# Patient Record
Sex: Female | Born: 1995 | Race: Black or African American | Hispanic: No | Marital: Single | Smoking: Never smoker
Health system: Southern US, Community
[De-identification: ages and names within clinical notes are randomized; demographics above are authoritative.]

## PROBLEM LIST (undated history)

## (undated) ENCOUNTER — Inpatient Hospital Stay (HOSPITAL_COMMUNITY): Payer: Self-pay

## (undated) ENCOUNTER — Emergency Department (HOSPITAL_BASED_OUTPATIENT_CLINIC_OR_DEPARTMENT_OTHER)

## (undated) DIAGNOSIS — N390 Urinary tract infection, site not specified: Secondary | ICD-10-CM

## (undated) DIAGNOSIS — F1090 Alcohol use, unspecified, uncomplicated: Secondary | ICD-10-CM

## (undated) DIAGNOSIS — F32A Depression, unspecified: Secondary | ICD-10-CM

## (undated) DIAGNOSIS — F419 Anxiety disorder, unspecified: Secondary | ICD-10-CM

## (undated) DIAGNOSIS — R87629 Unspecified abnormal cytological findings in specimens from vagina: Secondary | ICD-10-CM

## (undated) DIAGNOSIS — F329 Major depressive disorder, single episode, unspecified: Secondary | ICD-10-CM

## (undated) DIAGNOSIS — I10 Essential (primary) hypertension: Secondary | ICD-10-CM

## (undated) DIAGNOSIS — D649 Anemia, unspecified: Secondary | ICD-10-CM

## (undated) DIAGNOSIS — O24419 Gestational diabetes mellitus in pregnancy, unspecified control: Secondary | ICD-10-CM

## (undated) DIAGNOSIS — F332 Major depressive disorder, recurrent severe without psychotic features: Secondary | ICD-10-CM

## (undated) HISTORY — PX: BREAST SURGERY: SHX581

## (undated) HISTORY — DX: Alcohol use, unspecified, uncomplicated: F10.90

## (undated) HISTORY — DX: Major depressive disorder, recurrent severe without psychotic features: F33.2

## (undated) HISTORY — DX: Gestational diabetes mellitus in pregnancy, unspecified control: O24.419

## (undated) HISTORY — DX: Essential (primary) hypertension: I10

## (undated) HISTORY — DX: Unspecified abnormal cytological findings in specimens from vagina: R87.629

---
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
## 2013-01-27 ENCOUNTER — Encounter (HOSPITAL_COMMUNITY): Payer: Self-pay | Admitting: Emergency Medicine

## 2013-01-27 ENCOUNTER — Emergency Department (HOSPITAL_COMMUNITY)
Admission: EM | Admit: 2013-01-27 | Discharge: 2013-01-27 | Disposition: A | Discharge status: Home / Self Care | Payer: Medicaid Other | Attending: Emergency Medicine | Admitting: Emergency Medicine

## 2013-01-27 DIAGNOSIS — Z3202 Encounter for pregnancy test, result negative: Secondary | ICD-10-CM | POA: Insufficient documentation

## 2013-01-27 DIAGNOSIS — Z79899 Other long term (current) drug therapy: Secondary | ICD-10-CM | POA: Insufficient documentation

## 2013-01-27 DIAGNOSIS — R112 Nausea with vomiting, unspecified: Secondary | ICD-10-CM | POA: Insufficient documentation

## 2013-01-27 LAB — URINALYSIS, ROUTINE W REFLEX MICROSCOPIC
Hgb urine dipstick: NEGATIVE
Leukocytes, UA: NEGATIVE
Nitrite: NEGATIVE
Protein, ur: 30 mg/dL — AB
Specific Gravity, Urine: 1.021 (ref 1.005–1.030)
Urobilinogen, UA: 0.2 mg/dL (ref 0.0–1.0)

## 2013-01-27 LAB — COMPREHENSIVE METABOLIC PANEL
ALT: 10 U/L (ref 0–35)
CO2: 23 mEq/L (ref 19–32)
Calcium: 9.1 mg/dL (ref 8.4–10.5)
Creatinine, Ser: 0.77 mg/dL (ref 0.50–1.10)
GFR calc Af Amer: >90 mL/min (ref >90)
GFR calc non Af Amer: >90 mL/min (ref >90)
Glucose, Bld: 95 mg/dL (ref 70–99)
Sodium: 140 mEq/L (ref 135–145)
Total Protein: 7.8 g/dL (ref 6.0–8.3)

## 2013-01-27 LAB — CBC WITH DIFFERENTIAL/PLATELET
Basophils Absolute: 0 10*3/uL (ref 0.0–0.1)
Eosinophils Absolute: 0.1 10*3/uL (ref 0.0–0.7)
Eosinophils Relative: 1 % (ref 0–5)
HCT: 37 % (ref 36.0–46.0)
MCH: 24.5 pg — ABNORMAL LOW (ref 26.0–34.0)
MCV: 73.7 fL — ABNORMAL LOW (ref 78.0–100.0)
Monocytes Absolute: 0.6 10*3/uL (ref 0.1–1.0)
Platelets: 312 10*3/uL (ref 150–400)
RDW: 14.1 % (ref 11.5–15.5)

## 2013-01-27 LAB — LIPASE, BLOOD: Lipase: 32 U/L (ref 11–59)

## 2013-01-27 LAB — URINE MICROSCOPIC-ADD ON

## 2013-01-27 LAB — PREGNANCY, URINE: Preg Test, Ur: NEGATIVE

## 2013-01-27 MED ORDER — ONDANSETRON 4 MG PO TBDP: 4.0000 mg | ORAL_TABLET | Freq: Three times a day (TID) | ORAL | Status: DC | PRN

## 2013-01-27 MED ORDER — ONDANSETRON 4 MG PO TBDP
4.0000 mg | ORAL_TABLET | Freq: Once | ORAL | Status: AC
  Administered 2013-01-27: 4 mg via ORAL
  Filled 2013-01-27: qty 1

## 2013-01-27 NOTE — ED Notes (Signed)
The pt is c/o abd cramps n v no diarrrhea since this am she has ahad a cold and draining nose for the past 2-3 days.  lmp dec 3rd

## 2013-01-27 NOTE — ED Provider Notes (Signed)
CSN: [id number]     Arrival date & time 01/27/13  1509 History   First MD Initiated Contact with Patient 01/27/13 1955     Chief Complaint  Patient presents with  . Emesis   (Consider location/radiation/quality/duration/timing/severity/associated sxs/prior Treatment) The history is provided by the patient and medical records.   This is a 17 year old female with no significant PMH presenting to the ED for abdominal cramping, nausea, and non-bloody, non-bilious vomiting, onset this morning. Pt states abdominal cramping has since resolved.  Denies sick contacts, fevers, sweats, or chills.  Patient states she had a cold for the past few days with rhinorrhea, PND, and mildly sore throat-- these sx have since improved.  Pt has been unable to tolerate anything PO today. No urinary sx or vaginal complaints.  Denies possibility of pregnancy. No intervention PTA.  History reviewed. No pertinent past medical history. Past Surgical History  Procedure Laterality Date  . Mass excision  10/29/2011    Procedure: EXCISION MASS;  Surgeon: Shelly Rubenstein, MD;  Location: [facility];  Service: General;  Laterality: Bilateral;  excision of bilateral breast masses  . Breast surgery     Family History  Problem Relation Age of Onset  . Cancer Maternal Grandmother     lung cancer  . Cancer Other     bladder cancer   History  Substance Use Topics  . Smoking status: Never Smoker   . Smokeless tobacco: Not on file  . Alcohol Use: No   OB History   Grav Para Term Preterm Abortions TAB SAB Ect Mult Living                 Review of Systems  Gastrointestinal: Positive for nausea and vomiting.  All other systems reviewed and are negative.    Allergies  Review of patient's allergies indicates no known allergies.  Home Medications   Current Outpatient Rx  Name  Route  Sig  Dispense  Refill  . FLUoxetine (PROZAC) 40 MG capsule   Oral   Take 40 mg by mouth daily with breakfast.           BP 123/84   Pulse 98  Temp(Src) 98.8 F (37.1 C) (Oral)  Resp 18  SpO2 100%  LMP 12/04/2012  Physical Exam  Nursing note and vitals reviewed. Constitutional: She is oriented to person, place, and time. She appears well-developed and well-nourished. No distress.  HENT:  Head: Normocephalic and atraumatic.  Mouth/Throat: Oropharynx is clear and moist.  Mucus membranes moist, appears well hydrated  Eyes: Conjunctivae and EOM are normal. Pupils are equal, round, and reactive to light.  Neck: Normal range of motion.  Cardiovascular: Normal rate, regular rhythm and normal heart sounds.   Pulmonary/Chest: Effort normal and breath sounds normal. No respiratory distress. She has no wheezes.  Abdominal: Soft. Bowel sounds are normal. There is no tenderness. There is no guarding.  Abdomen soft, nondistended, no peritoneal signs  Musculoskeletal: Normal range of motion.  Neurological: She is alert and oriented to person, place, and time.  Skin: Skin is warm and dry. She is not diaphoretic.  Psychiatric: She has a normal mood and affect.     ED Course  Procedures (including critical care time) Labs Review Labs Reviewed  CBC WITH DIFFERENTIAL - Abnormal; Notable for the following:    MCV 73.7 (*)    MCH 24.5 (*)    All other components within normal limits  URINALYSIS, ROUTINE W REFLEX MICROSCOPIC - Abnormal; Notable for the following:  Protein, ur 30 (*)    All other components within normal limits  URINE MICROSCOPIC-ADD ON - Abnormal; Notable for the following:    Squamous Epithelial / LPF FEW (*)    Bacteria, UA FEW (*)    All other components within normal limits  COMPREHENSIVE METABOLIC PANEL  LIPASE, BLOOD  PREGNANCY, URINE   Imaging Review No results found.  EKG Interpretation   None       MDM   1. Nausea & vomiting    Urine pregnancy negative.  Labs reassuring.  Patient is non-toxic and appears well-hydrated, do not feel iVF are indicated at this time. Patient given Zofran  ODT and was able to tolerate multiple cups of water without recurrent vomiting.  Abdominal exam remains benign and non-surgical-- sx likely due to viral gastroenteritis.  Pt afebrile, non-toxic appearing, NAD, VS stable- ok for discharge.  Rx Zofran. Signs/sx that would warrant return to the ED discussed, patient acknowledged understanding and agreed.  Garlon Hatchet, PA-C 01/27/13 2221

## 2013-01-27 NOTE — ED Notes (Signed)
Gingerale given for fluid challenge. Tolerating so far.

## 2013-01-29 NOTE — ED Provider Notes (Signed)
Medical screening examination/treatment/procedure(s) were performed by non-physician practitioner and as supervising physician I was immediately available for consultation/collaboration.  Sharon Rubis M Takerra Lupinacci, MD 01/29/13 1628 

## 2013-12-05 ENCOUNTER — Encounter (HOSPITAL_COMMUNITY): Payer: Self-pay | Admitting: *Deleted

## 2013-12-05 ENCOUNTER — Emergency Department (HOSPITAL_COMMUNITY)
Admission: EM | Admit: 2013-12-05 | Discharge: 2013-12-06 | Disposition: A | Discharge status: Home / Self Care | Payer: Medicaid Other | Attending: Emergency Medicine | Admitting: Emergency Medicine

## 2013-12-05 DIAGNOSIS — Z79899 Other long term (current) drug therapy: Secondary | ICD-10-CM | POA: Insufficient documentation

## 2013-12-05 DIAGNOSIS — Z113 Encounter for screening for infections with a predominantly sexual mode of transmission: Secondary | ICD-10-CM | POA: Insufficient documentation

## 2013-12-05 DIAGNOSIS — N898 Other specified noninflammatory disorders of vagina: Secondary | ICD-10-CM | POA: Insufficient documentation

## 2013-12-05 DIAGNOSIS — Z202 Contact with and (suspected) exposure to infections with a predominantly sexual mode of transmission: Secondary | ICD-10-CM

## 2013-12-05 LAB — URINALYSIS, ROUTINE W REFLEX MICROSCOPIC
Bilirubin Urine: NEGATIVE
Glucose, UA: NEGATIVE mg/dL
Ketones, ur: NEGATIVE mg/dL
NITRITE: NEGATIVE
Protein, ur: NEGATIVE mg/dL
SPECIFIC GRAVITY, URINE: 1.026 (ref 1.005–1.030)
UROBILINOGEN UA: 1 mg/dL (ref 0.0–1.0)
pH: 6.5 (ref 5.0–8.0)

## 2013-12-05 LAB — URINE MICROSCOPIC-ADD ON

## 2013-12-05 LAB — PREGNANCY, URINE: PREG TEST UR: NEGATIVE

## 2013-12-05 NOTE — ED Notes (Signed)
The pt is c/o itching in her vagina for 2-3 days no vaginal discharge lmp oct

## 2013-12-06 LAB — WET PREP, GENITAL

## 2013-12-06 LAB — HIV ANTIBODY (ROUTINE TESTING W REFLEX): HIV 1&2 Ab, 4th Generation: NONREACTIVE

## 2013-12-06 MED ORDER — AZITHROMYCIN 250 MG PO TABS
1000.0000 mg | ORAL_TABLET | Freq: Once | ORAL | Status: AC
  Administered 2013-12-06: 1000 mg via ORAL
  Filled 2013-12-06: qty 4

## 2013-12-06 MED ORDER — STERILE WATER FOR INJECTION IJ SOLN
INTRAMUSCULAR | Status: AC
  Administered 2013-12-06: 0.9 mL
  Filled 2013-12-06: qty 10

## 2013-12-06 MED ORDER — FLUCONAZOLE 100 MG PO TABS
150.0000 mg | ORAL_TABLET | Freq: Once | ORAL | Status: AC
  Administered 2013-12-06: 150 mg via ORAL
  Filled 2013-12-06: qty 2

## 2013-12-06 MED ORDER — CEFTRIAXONE SODIUM 250 MG IJ SOLR
250.0000 mg | Freq: Once | INTRAMUSCULAR | Status: AC
  Administered 2013-12-06: 250 mg via INTRAMUSCULAR
  Filled 2013-12-06: qty 250

## 2013-12-06 MED ORDER — METRONIDAZOLE 500 MG PO TABS
2000.0000 mg | ORAL_TABLET | Freq: Once | ORAL | Status: AC
  Administered 2013-12-06: 2000 mg via ORAL
  Filled 2013-12-06: qty 4

## 2013-12-06 NOTE — Discharge Instructions (Signed)
Urine pregnancy test was negative. You been treated for STD exposures. Return for any new or worse symptoms.

## 2013-12-06 NOTE — ED Provider Notes (Signed)
CSN: [id number]     Arrival date & time 12/05/13  2032 History   First MD Initiated Contact with Patient 12/05/13 2337     Chief Complaint  Patient presents with  . Vaginal Itching     (Consider location/radiation/quality/duration/timing/severity/associated sxs/prior Treatment) Patient is a 18 y.o. female presenting with vaginal itching. The history is provided by the patient.  Vaginal Itching Pertinent negatives include no chest pain, no abdominal pain, no headaches and no shortness of breath.  18 year old female with concern for STD exposure. Had unprotected sex. Patient's been having a discharge is whitish and itching in the vaginal area. Last measured. Was October. Patient with no known allergies. Patient denies abdominal pain pelvic pain and difficulty urinating. Patient also with concern for a mass to her left breast. She's had a history of breast abscess in the past. She's had an area of soreness there.  History reviewed. No pertinent past medical history. Past Surgical History  Procedure Laterality Date  . Mass excision  10/29/2011    Procedure: EXCISION MASS;  Surgeon: Harl Bowie, MD;  Location: [facility];  Service: General;  Laterality: Bilateral;  excision of bilateral breast masses  . Breast surgery     Family History  Problem Relation Age of Onset  . Cancer Maternal Grandmother     lung cancer  . Cancer Other     bladder cancer   History  Substance Use Topics  . Smoking status: Never Smoker   . Smokeless tobacco: Not on file  . Alcohol Use: No   OB History    No data available     Review of Systems  Constitutional: Negative for fever.  HENT: Negative for congestion.   Eyes: Negative for redness.  Respiratory: Negative for shortness of breath.   Cardiovascular: Negative for chest pain.  Gastrointestinal: Negative for nausea, vomiting and abdominal pain.  Genitourinary: Positive for vaginal discharge. Negative for dysuria, vaginal bleeding and difficulty  urinating.  Musculoskeletal: Negative for myalgias and back pain.  Skin: Negative for rash.  Allergic/Immunologic: Negative for immunocompromised state.  Neurological: Negative for headaches.  Hematological: Does not bruise/bleed easily.  Psychiatric/Behavioral: Negative for confusion.      Allergies  Review of patient's allergies indicates no known allergies.  Home Medications   Prior to Admission medications   Medication Sig Start Date End Date Taking? Authorizing Provider  FLUoxetine (PROZAC) 40 MG capsule Take 40 mg by mouth daily with breakfast.    Yes Historical Provider, MD  ondansetron (ZOFRAN ODT) 4 MG disintegrating tablet Take 1 tablet (4 mg total) by mouth every 8 (eight) hours as needed for nausea. 01/27/13   Larene Pickett, PA-C   BP 101/81 mmHg  Pulse 84  Temp(Src) 98.2 F (36.8 C)  Resp 18  SpO2 100%  LMP 11/04/2013 Physical Exam  Constitutional: She is oriented to person, place, and time. She appears well-developed and well-nourished. No distress.  HENT:  Head: Normocephalic and atraumatic.  Mouth/Throat: Oropharynx is clear and moist.  Eyes: Conjunctivae and EOM are normal. Pupils are equal, round, and reactive to light.  Neck: Normal range of motion.  Cardiovascular: Normal rate, regular rhythm and normal heart sounds.   No murmur heard. Pulmonary/Chest: Effort normal and breath sounds normal. No respiratory distress.  Patient concerned with the left-sided breast mass. History of abscesses in the past. No palpable cyst or mass. Nontender.  Abdominal: Soft. Bowel sounds are normal. There is no tenderness.  Genitourinary: Vaginal discharge found.  Normal external genitalia. No  lesions. Whitish vaginal discharge. Yellowish cervical discharge. No cervical motion tenderness. No uterine tenderness. No adnexal tenderness.  Musculoskeletal: Normal range of motion.  Neurological: She is alert and oriented to person, place, and time. No cranial nerve deficit. She  exhibits normal muscle tone. Coordination normal.  Skin: Skin is warm. No rash noted.  Nursing note and vitals reviewed.   ED Course  Procedures (including critical care time) Labs Review Labs Reviewed  URINALYSIS, ROUTINE W REFLEX MICROSCOPIC - Abnormal; Notable for the following:    APPearance CLOUDY (*)    Hgb urine dipstick SMALL (*)    Leukocytes, UA MODERATE (*)    All other components within normal limits  URINE MICROSCOPIC-ADD ON - Abnormal; Notable for the following:    Squamous Epithelial / LPF MANY (*)    Bacteria, UA FEW (*)    All other components within normal limits  WET PREP, GENITAL  GC/CHLAMYDIA PROBE AMP  PREGNANCY, URINE  HIV ANTIBODY (ROUTINE TESTING)   Results for orders placed or performed during the hospital encounter of 12/05/13  Urinalysis, Routine w reflex microscopic  Result Value Ref Range   Color, Urine YELLOW YELLOW   APPearance CLOUDY (A) CLEAR   Specific Gravity, Urine 1.026 1.005 - 1.030   pH 6.5 5.0 - 8.0   Glucose, UA NEGATIVE NEGATIVE mg/dL   Hgb urine dipstick SMALL (A) NEGATIVE   Bilirubin Urine NEGATIVE NEGATIVE   Ketones, ur NEGATIVE NEGATIVE mg/dL   Protein, ur NEGATIVE NEGATIVE mg/dL   Urobilinogen, UA 1.0 0.0 - 1.0 mg/dL   Nitrite NEGATIVE NEGATIVE   Leukocytes, UA MODERATE (A) NEGATIVE  Pregnancy, urine  Result Value Ref Range   Preg Test, Ur NEGATIVE NEGATIVE  Urine microscopic-add on  Result Value Ref Range   Squamous Epithelial / LPF MANY (A) RARE   WBC, UA 11-20 <3 WBC/hpf   RBC / HPF 3-6 <3 RBC/hpf   Bacteria, UA FEW (A) RARE   Urine-Other MUCOUS PRESENT      Imaging Review No results found.   EKG Interpretation None      MDM   Final diagnoses:  Possible exposure to STD  Vaginal discharge   Exam not consistent with PID. There is a whitish discharge that could be yeast in nature. We'll treat with Diflucan. For her STD exposure concern patient will be treated with Rocephin and Zithromax and Flagyl.  Patient also with a yellow discharge and very well could be consistent with an STD. Patient also concerned about left breast area tenderness and of history of abscesses. Nothing palpable no longer tender. Patient even confirm that the soreness she had there is now gone.  Patient treated with Rocephin IM 1 g of Zithromax, 2 g of Flagyl, and 150 mg Diflucan.    Fredia Sorrow, MD 12/06/13 [phone]

## 2013-12-06 NOTE — ED Notes (Signed)
Waiting on medication from the pharmacy

## 2013-12-07 LAB — GC/CHLAMYDIA PROBE AMP
CT PROBE, AMP APTIMA: NEGATIVE
GC Probe RNA: NEGATIVE

## 2014-05-15 ENCOUNTER — Emergency Department (HOSPITAL_COMMUNITY)
Admission: EM | Admit: 2014-05-15 | Discharge: 2014-05-15 | Disposition: A | Discharge status: Home / Self Care | Payer: Medicaid Other | Attending: Emergency Medicine | Admitting: Emergency Medicine

## 2014-05-15 ENCOUNTER — Emergency Department (HOSPITAL_COMMUNITY): Payer: Medicaid Other

## 2014-05-15 ENCOUNTER — Encounter (HOSPITAL_COMMUNITY): Payer: Self-pay | Admitting: Emergency Medicine

## 2014-05-15 DIAGNOSIS — J209 Acute bronchitis, unspecified: Secondary | ICD-10-CM | POA: Insufficient documentation

## 2014-05-15 DIAGNOSIS — J40 Bronchitis, not specified as acute or chronic: Secondary | ICD-10-CM

## 2014-05-15 LAB — BASIC METABOLIC PANEL
Anion gap: 8 (ref 5–15)
BUN: 10 mg/dL (ref 6–23)
CO2: 23 mmol/L (ref 19–32)
Calcium: 9.4 mg/dL (ref 8.4–10.5)
Chloride: 106 mmol/L (ref 96–112)
Creatinine, Ser: 0.86 mg/dL (ref 0.50–1.10)
GFR calc non Af Amer: >90 mL/min (ref >90)
Glucose, Bld: 89 mg/dL (ref 70–99)
POTASSIUM: 3.8 mmol/L (ref 3.5–5.1)
SODIUM: 137 mmol/L (ref 135–145)

## 2014-05-15 LAB — CBC
HEMATOCRIT: 39.4 % (ref 36.0–46.0)
HEMOGLOBIN: 12.5 g/dL (ref 12.0–15.0)
MCH: 23.7 pg — AB (ref 26.0–34.0)
MCHC: 31.7 g/dL (ref 30.0–36.0)
MCV: 74.6 fL — AB (ref 78.0–100.0)
Platelets: 317 10*3/uL (ref 150–400)
RBC: 5.28 MIL/uL — AB (ref 3.87–5.11)
RDW: 13.9 % (ref 11.5–15.5)
WBC: 9.1 10*3/uL (ref 4.0–10.5)

## 2014-05-15 LAB — BRAIN NATRIURETIC PEPTIDE: B Natriuretic Peptide: 16.3 pg/mL (ref 0.0–100.0)

## 2014-05-15 LAB — I-STAT TROPONIN, ED: TROPONIN I, POC: 0 ng/mL (ref 0.00–0.08)

## 2014-05-15 MED ORDER — AZITHROMYCIN 250 MG PO TABS: ORAL_TABLET | ORAL | Status: DC

## 2014-05-15 NOTE — ED Notes (Signed)
Pt c/o burning left sided chest pain and SOB x 1 week, 1 episode of emesis this morning.

## 2014-05-15 NOTE — Discharge Instructions (Signed)
Take tylenol or motrin for pain.  Follow up if not improvning

## 2014-05-15 NOTE — ED Provider Notes (Signed)
CSN: [id number]     Arrival date & time 05/15/14  2952 History   First MD Initiated Contact with Patient 05/15/14 1006     Chief Complaint  Patient presents with  . Chest Pain     (Consider location/radiation/quality/duration/timing/severity/associated sxs/prior Treatment) Patient is a 19 y.o. female presenting with chest pain. The history is provided by the patient (the pt complains of a cough for a week and some chest pain).  Chest Pain Pain location:  L chest Pain quality: aching   Pain radiates to:  Does not radiate Pain radiates to the back: no   Pain severity:  Moderate Onset quality:  Sudden Timing:  Constant Progression:  Waxing and waning Chronicity:  New Context: not breathing   Associated symptoms: no abdominal pain, no back pain, no cough, no fatigue and no headache     History reviewed. No pertinent past medical history. Past Surgical History  Procedure Laterality Date  . Mass excision  10/29/2011    Procedure: EXCISION MASS;  Surgeon: Harl Bowie, MD;  Location: [facility];  Service: General;  Laterality: Bilateral;  excision of bilateral breast masses  . Breast surgery     Family History  Problem Relation Age of Onset  . Cancer Maternal Grandmother     lung cancer  . Cancer Other     bladder cancer   History  Substance Use Topics  . Smoking status: Never Smoker   . Smokeless tobacco: Not on file  . Alcohol Use: No   OB History    No data available     Review of Systems  Constitutional: Negative for appetite change and fatigue.  HENT: Negative for congestion, ear discharge and sinus pressure.   Eyes: Negative for discharge.  Respiratory: Negative for cough.   Cardiovascular: Positive for chest pain.  Gastrointestinal: Negative for abdominal pain and diarrhea.  Genitourinary: Negative for frequency and hematuria.  Musculoskeletal: Negative for back pain.  Skin: Negative for rash.  Neurological: Negative for seizures and headaches.   Psychiatric/Behavioral: Negative for hallucinations.      Allergies  Review of patient's allergies indicates no known allergies.  Home Medications   Prior to Admission medications   Medication Sig Start Date End Date Taking? Authorizing Provider  azithromycin (ZITHROMAX Z-PAK) 250 MG tablet 2 po day one, then 1 daily x 4 days 05/15/14   Milton Ferguson, MD  ondansetron (ZOFRAN ODT) 4 MG disintegrating tablet Take 1 tablet (4 mg total) by mouth every 8 (eight) hours as needed for nausea. Patient not taking: Reported on 05/15/2014 01/27/13   Larene Pickett, PA-C   BP 137/77 mmHg  Pulse 89  Temp(Src) 98.9 F (37.2 C) (Oral)  Resp 18  SpO2 100%  LMP 05/01/2014 Physical Exam  Constitutional: She is oriented to person, place, and time. She appears well-developed.  HENT:  Head: Normocephalic.  Eyes: Conjunctivae and EOM are normal. No scleral icterus.  Neck: Neck supple. No thyromegaly present.  Cardiovascular: Normal rate and regular rhythm.  Exam reveals no gallop and no friction rub.   No murmur heard. Pulmonary/Chest: No stridor. She has no wheezes. She has no rales. She exhibits no tenderness.  Abdominal: She exhibits no distension. There is no tenderness. There is no rebound.  Musculoskeletal: Normal range of motion. She exhibits no edema.  Lymphadenopathy:    She has no cervical adenopathy.  Neurological: She is oriented to person, place, and time. She exhibits normal muscle tone. Coordination normal.  Skin: No rash noted. No erythema.  Psychiatric: She has a normal mood and affect. Her behavior is normal.    ED Course  Procedures (including critical care time) Labs Review Labs Reviewed  CBC - Abnormal; Notable for the following:    RBC 5.28 (*)    MCV 74.6 (*)    MCH 23.7 (*)    All other components within normal limits  BASIC METABOLIC PANEL  BRAIN NATRIURETIC PEPTIDE  I-STAT TROPOININ, ED    Imaging Review Dg Chest Port 1 View  05/15/2014   CLINICAL DATA:   Chest pain for 1 week, shortness of Breath  EXAM: PORTABLE CHEST - 1 VIEW  COMPARISON:  09/16/2012  FINDINGS: Cardiomediastinal silhouette is stable. No acute infiltrate or pleural effusion. No pulmonary edema. Bony thorax is unremarkable.  IMPRESSION: No active disease.   Electronically Signed   By: Lahoma Crocker M.D.   On: 05/15/2014 10:51     EKG Interpretation   Date/Time:  Wednesday May 15 2014 09:57:38 EDT Ventricular Rate:  98 PR Interval:  155 QRS Duration: 94 QT Interval:  345 QTC Calculation: 440 R Axis:   80 Text Interpretation:  Sinus rhythm Confirmed by Alejandra Barna  MD, Tristin Gladman [phone])  on 05/15/2014 1:10:36 PM      MDM   Final diagnoses:  Bronchitis    Bronchitis,  Zpak,  Follow up prn    Milton Ferguson, MD 05/15/14 1315

## 2014-11-04 ENCOUNTER — Encounter (HOSPITAL_COMMUNITY): Payer: Self-pay | Admitting: Emergency Medicine

## 2014-11-04 ENCOUNTER — Emergency Department (HOSPITAL_COMMUNITY)
Admission: EM | Admit: 2014-11-04 | Discharge: 2014-11-04 | Disposition: A | Discharge status: Home / Self Care | Payer: Medicaid Other | Attending: Emergency Medicine | Admitting: Emergency Medicine

## 2014-11-04 DIAGNOSIS — N760 Acute vaginitis: Secondary | ICD-10-CM | POA: Insufficient documentation

## 2014-11-04 DIAGNOSIS — Z202 Contact with and (suspected) exposure to infections with a predominantly sexual mode of transmission: Secondary | ICD-10-CM | POA: Insufficient documentation

## 2014-11-04 DIAGNOSIS — Z3202 Encounter for pregnancy test, result negative: Secondary | ICD-10-CM | POA: Insufficient documentation

## 2014-11-04 DIAGNOSIS — B9689 Other specified bacterial agents as the cause of diseases classified elsewhere: Secondary | ICD-10-CM

## 2014-11-04 DIAGNOSIS — R079 Chest pain, unspecified: Secondary | ICD-10-CM | POA: Insufficient documentation

## 2014-11-04 LAB — WET PREP, GENITAL
Trich, Wet Prep: NONE SEEN
WBC WET PREP: NONE SEEN
YEAST WET PREP: NONE SEEN

## 2014-11-04 LAB — CBC WITH DIFFERENTIAL/PLATELET
Basophils Absolute: 0.1 10*3/uL (ref 0.0–0.1)
Basophils Relative: 1 %
EOS ABS: 0.1 10*3/uL (ref 0.0–0.7)
Eosinophils Relative: 2 %
HCT: 40.1 % (ref 36.0–46.0)
Hemoglobin: 12.9 g/dL (ref 12.0–15.0)
Lymphocytes Relative: 38 %
Lymphs Abs: 2.4 10*3/uL (ref 0.7–4.0)
MCH: 24 pg — ABNORMAL LOW (ref 26.0–34.0)
MCHC: 32.2 g/dL (ref 30.0–36.0)
MCV: 74.5 fL — ABNORMAL LOW (ref 78.0–100.0)
Monocytes Absolute: 0.5 10*3/uL (ref 0.1–1.0)
Monocytes Relative: 8 %
NEUTROS PCT: 51 %
Neutro Abs: 3.2 10*3/uL (ref 1.7–7.7)
Platelets: 272 10*3/uL (ref 150–400)
RBC: 5.38 MIL/uL — AB (ref 3.87–5.11)
RDW: 14.3 % (ref 11.5–15.5)
WBC: 6.3 10*3/uL (ref 4.0–10.5)

## 2014-11-04 LAB — URINALYSIS, ROUTINE W REFLEX MICROSCOPIC
Bilirubin Urine: NEGATIVE
Glucose, UA: NEGATIVE mg/dL
KETONES UR: NEGATIVE mg/dL
LEUKOCYTES UA: NEGATIVE
NITRITE: NEGATIVE
PROTEIN: NEGATIVE mg/dL
Specific Gravity, Urine: 1.018 (ref 1.005–1.030)
Urobilinogen, UA: 1 mg/dL (ref 0.0–1.0)
pH: 7 (ref 5.0–8.0)

## 2014-11-04 LAB — POC URINE PREG, ED: Preg Test, Ur: NEGATIVE

## 2014-11-04 LAB — URINE MICROSCOPIC-ADD ON

## 2014-11-04 MED ORDER — AZITHROMYCIN 250 MG PO TABS
1000.0000 mg | ORAL_TABLET | Freq: Every day | ORAL | Status: DC
  Administered 2014-11-04: 1000 mg via ORAL
  Filled 2014-11-04: qty 4

## 2014-11-04 MED ORDER — METRONIDAZOLE 500 MG PO TABS: 500.0000 mg | ORAL_TABLET | Freq: Two times a day (BID) | ORAL | Status: DC

## 2014-11-04 MED ORDER — CEFTRIAXONE SODIUM 250 MG IJ SOLR
250.0000 mg | Freq: Once | INTRAMUSCULAR | Status: AC
  Administered 2014-11-04: 250 mg via INTRAMUSCULAR
  Filled 2014-11-04: qty 250

## 2014-11-04 MED ORDER — STERILE WATER FOR INJECTION IJ SOLN
INTRAMUSCULAR | Status: AC
  Administered 2014-11-04: 2.5 mL
  Filled 2014-11-04: qty 10

## 2014-11-04 NOTE — ED Notes (Signed)
Pt c/o pelvic pain and discharge that started today.  Also c/o chest pain x 3 days.  Denies any cough but states she feels SOB.  LMP 9/26-2016

## 2014-11-04 NOTE — Discharge Instructions (Signed)
Bacterial Vaginosis Bacterial vaginosis is a vaginal infection that occurs when the normal balance of bacteria in the vagina is disrupted. It results from an overgrowth of certain bacteria. This is the most common vaginal infection in women of childbearing age. Treatment is important to prevent complications, especially in pregnant women, as it can cause a premature delivery. CAUSES  Bacterial vaginosis is caused by an increase in harmful bacteria that are normally present in smaller amounts in the vagina. Several different kinds of bacteria can cause bacterial vaginosis. However, the reason that the condition develops is not fully understood. RISK FACTORS Certain activities or behaviors can put you at an increased risk of developing bacterial vaginosis, including:  Having a new sex partner or multiple sex partners.  Douching.  Using an intrauterine device (IUD) for contraception. Women do not get bacterial vaginosis from toilet seats, bedding, swimming pools, or contact with objects around them. SIGNS AND SYMPTOMS  Some women with bacterial vaginosis have no signs or symptoms. Common symptoms include:  Grey vaginal discharge.  A fishlike odor with discharge, especially after sexual intercourse.  Itching or burning of the vagina and vulva.  Burning or pain with urination. DIAGNOSIS  Your health care provider will take a medical history and examine the vagina for signs of bacterial vaginosis. A sample of vaginal fluid may be taken. Your health care provider will look at this sample under a microscope to check for bacteria and abnormal cells. A vaginal pH test may also be done.  TREATMENT  Bacterial vaginosis may be treated with antibiotic medicines. These may be given in the form of a pill or a vaginal cream. A second round of antibiotics may be prescribed if the condition comes back after treatment.  HOME CARE INSTRUCTIONS   Only take over-the-counter or prescription medicines as  directed by your health care provider.  If antibiotic medicine was prescribed, take it as directed. Make sure you finish it even if you start to feel better.  Do not have sex until treatment is completed.  Tell all sexual partners that you have a vaginal infection. They should see their health care provider and be treated if they have problems, such as a mild rash or itching.  Practice safe sex by using condoms and only having one sex partner. SEEK MEDICAL CARE IF:   Your symptoms are not improving after 3 days of treatment.  You have increased discharge or pain.  You have a fever. MAKE SURE YOU:   Understand these instructions.  Will watch your condition.  Will get help right away if you are not doing well or get worse. FOR MORE INFORMATION  Centers for Disease Control and Prevention, Division of STD Prevention: [employer]raud[employer] American Sexual Health Association (ASHA): www.ashastd.org  Document Released: 01/18/2005 Document Revised: 11/08/2012 Document Reviewed: 08/30/2012 [facility] Patient Information 2015 [place], [place]. This information is not intended to replace advice given to you by your health care provider. Make sure you discuss any questions you have with your health care provider.  Chest Pain (Nonspecific) It is often hard to give a specific diagnosis for the cause of chest pain. There is always a chance that your pain could be related to something serious, such as a heart attack or a blood clot in the lungs. You need to follow up with your health care provider for further evaluation. CAUSES   Heartburn.  Pneumonia or bronchitis.  Anxiety or stress.  Inflammation around your heart (pericarditis) or lung (pleuritis or pleurisy).  A blood clot  in the lung.  A collapsed lung (pneumothorax). It can develop suddenly on its own (spontaneous pneumothorax) or from trauma to the chest.  Shingles infection (herpes zoster virus). The chest wall is composed of bones,  muscles, and cartilage. Any of these can be the source of the pain.  The bones can be bruised by injury.  The muscles or cartilage can be strained by coughing or overwork.  The cartilage can be affected by inflammation and become sore (costochondritis). DIAGNOSIS  Lab tests or other studies may be needed to find the cause of your pain. Your health care provider may have you take a test called an ambulatory electrocardiogram (ECG). An ECG records your heartbeat patterns over a 24-hour period. You may also have other tests, such as:  Transthoracic echocardiogram (TTE). During echocardiography, sound waves are used to evaluate how blood flows through your heart.  Transesophageal echocardiogram (TEE).  Cardiac monitoring. This allows your health care provider to monitor your heart rate and rhythm in real time.  Holter monitor. This is a portable device that records your heartbeat and can help diagnose heart arrhythmias. It allows your health care provider to track your heart activity for several days, if needed.  Stress tests by exercise or by giving medicine that makes the heart beat faster. TREATMENT   Treatment depends on what may be causing your chest pain. Treatment may include:  Acid blockers for heartburn.  Anti-inflammatory medicine.  Pain medicine for inflammatory conditions.  Antibiotics if an infection is present.  You may be advised to change lifestyle habits. This includes stopping smoking and avoiding alcohol, caffeine, and chocolate.  You may be advised to keep your head raised (elevated) when sleeping. This reduces the chance of acid going backward from your stomach into your esophagus. Most of the time, nonspecific chest pain will improve within 2-3 days with rest and mild pain medicine.  HOME CARE INSTRUCTIONS   If antibiotics were prescribed, take them as directed. Finish them even if you start to feel better.  For the next few days, avoid physical activities  that bring on chest pain. Continue physical activities as directed.  Do not use any tobacco products, including cigarettes, chewing tobacco, or electronic cigarettes.  Avoid drinking alcohol.  Only take medicine as directed by your health care provider.  Follow your health care provider's suggestions for further testing if your chest pain does not go away.  Keep any follow-up appointments you made. If you do not go to an appointment, you could develop lasting (chronic) problems with pain. If there is any problem keeping an appointment, call to reschedule. SEEK MEDICAL CARE IF:   Your chest pain does not go away, even after treatment.  You have a rash with blisters on your chest.  You have a fever. SEEK IMMEDIATE MEDICAL CARE IF:   You have increased chest pain or pain that spreads to your arm, neck, jaw, back, or abdomen.  You have shortness of breath.  You have an increasing cough, or you cough up blood.  You have severe back or abdominal pain.  You feel nauseous or vomit.  You have severe weakness.  You faint.  You have chills. This is an emergency. Do not wait to see if the pain will go away. Get medical help at once. Call your local emergency services (911 in [place] Do not drive yourself to the hospital. MAKE SURE YOU:   Understand these instructions.  Will watch your condition.  Will get help right away if  you are not doing well or get worse. Document Released: 10/28/2004 Document Revised: 01/23/2013 Document Reviewed: 08/24/2007 [facility] Patient Information 2015 [place], [place]. This information is not intended to replace advice given to you by your health care provider. Make sure you discuss any questions you have with your health care provider.  Sexually Transmitted Disease A sexually transmitted disease (STD) is a disease or infection that may be passed (transmitted) from person to person, usually during sexual activity. This may happen by way of saliva, semen,  blood, vaginal mucus, or urine. Common STDs include:   Gonorrhea.   Chlamydia.   Syphilis.   HIV and AIDS.   Genital herpes.   Hepatitis B and C.   Trichomonas.   Human papillomavirus (HPV).   Pubic lice.   Scabies.  Mites.  Bacterial vaginosis. WHAT ARE CAUSES OF STDs? An STD may be caused by bacteria, a virus, or parasites. STDs are often transmitted during sexual activity if one person is infected. However, they may also be transmitted through nonsexual means. STDs may be transmitted after:   Sexual intercourse with an infected person.   Sharing sex toys with an infected person.   Sharing needles with an infected person or using unclean piercing or tattoo needles.  Having intimate contact with the genitals, mouth, or rectal areas of an infected person.   Exposure to infected fluids during birth. WHAT ARE THE SIGNS AND SYMPTOMS OF STDs? Different STDs have different symptoms. Some people may not have any symptoms. If symptoms are present, they may include:   Painful or bloody urination.   Pain in the pelvis, abdomen, vagina, anus, throat, or eyes.   A skin rash, itching, or irritation.  Growths, ulcerations, blisters, or sores in the genital and anal areas.  Abnormal vaginal discharge with or without bad odor.   Penile discharge in men.   Fever.   Pain or bleeding during sexual intercourse.   Swollen glands in the groin area.   Yellow skin and eyes (jaundice). This is seen with hepatitis.   Swollen testicles.  Infertility.  Sores and blisters in the mouth. HOW ARE STDs DIAGNOSED? To make a diagnosis, your health care provider may:   Take a medical history.   Perform a physical exam.   Take a sample of any discharge to examine.  Swab the throat, cervix, opening to the penis, rectum, or vagina for testing.  Test a sample of your first morning urine.   Perform blood tests.   Perform a Pap test, if this applies.    Perform a colposcopy.   Perform a laparoscopy.  HOW ARE STDs TREATED? Treatment depends on the STD. Some STDs may be treated but not cured.   Chlamydia, gonorrhea, trichomonas, and syphilis can be cured with antibiotic medicine.   Genital herpes, hepatitis, and HIV can be treated, but not cured, with prescribed medicines. The medicines lessen symptoms.   Genital warts from HPV can be treated with medicine or by freezing, burning (electrocautery), or surgery. Warts may come back.   HPV cannot be cured with medicine or surgery. However, abnormal areas may be removed from the cervix, vagina, or vulva.   If your diagnosis is confirmed, your recent sexual partners need treatment. This is true even if they are symptom-free or have a negative culture or evaluation. They should not have sex until their health care providers say it is okay. HOW CAN I REDUCE MY RISK OF GETTING AN STD? Take these steps to reduce your risk of getting  an STD:  Use latex condoms, dental dams, and water-soluble lubricants during sexual activity. Do not use petroleum jelly or oils.  Avoid having multiple sex partners.  Do not have sex with someone who has other sex partners.  Do not have sex with anyone you do not know or who is at high risk for an STD.  Avoid risky sex practices that can break your skin.  Do not have sex if you have open sores on your mouth or skin.  Avoid drinking too much alcohol or taking illegal drugs. Alcohol and drugs can affect your judgment and put you in a vulnerable position.  Avoid engaging in oral and anal sex acts.  Get vaccinated for HPV and hepatitis. If you have not received these vaccines in the past, talk to your health care provider about whether one or both might be right for you.   If you are at risk of being infected with HIV, it is recommended that you take a prescription medicine daily to prevent HIV infection. This is called pre-exposure prophylaxis (PrEP).  You are considered at risk if:  You are a man who has sex with other men (MSM).  You are a heterosexual man or woman and are sexually active with more than one partner.  You take drugs by injection.  You are sexually active with a partner who has HIV.  Talk with your health care provider about whether you are at high risk of being infected with HIV. If you choose to begin PrEP, you should first be tested for HIV. You should then be tested every 3 months for as long as you are taking PrEP.  WHAT SHOULD I DO IF I THINK I HAVE AN STD?  See your health care provider.   Tell your sexual partner(s). They should be tested and treated for any STDs.  Do not have sex until your health care provider says it is okay. WHEN SHOULD I GET IMMEDIATE MEDICAL CARE? Contact your health care provider right away if:   You have severe abdominal pain.  You are a man and notice swelling or pain in your testicles.  You are a woman and notice swelling or pain in your vagina. Document Released: 04/10/2002 Document Revised: 01/23/2013 Document Reviewed: 08/08/2012 [facility] Patient Information 2015 [place], [place]. This information is not intended to replace advice given to you by your health care provider. Make sure you discuss any questions you have with your health care provider.  Safe Sex Safe sex is about reducing the risk of giving or getting a sexually transmitted disease (STD). STDs are spread through sexual contact involving the genitals, mouth, or rectum. Some STDs can be cured and others cannot. Safe sex can also prevent unintended pregnancies.  WHAT ARE SOME SAFE SEX PRACTICES?  Limit your sexual activity to only one partner who is having sex with only you.  Talk to your partner about his or her past partners, past STDs, and drug use.  Use a condom every time you have sexual intercourse. This includes vaginal, oral, and anal sexual activity. Both females and males should wear condoms during  oral sex. Only use latex or polyurethane condoms and water-based lubricants. Using petroleum-based lubricants or oils to lubricate a condom will weaken the condom and increase the chance that it will break. The condom should be in place from the beginning to the end of sexual activity. Wearing a condom reduces, but does not completely eliminate, your risk of getting or giving an STD. STDs can  be spread by contact with infected body fluids and skin.  Get vaccinated for hepatitis B and HPV.  Avoid alcohol and recreational drugs, which can affect your judgment. You may forget to use a condom or participate in high-risk sex.  For females, avoid douching after sexual intercourse. Douching can spread an infection farther into the reproductive tract.  Check your body for signs of sores, blisters, rashes, or unusual discharge. See your health care provider if you notice any of these signs.  Avoid sexual contact if you have symptoms of an infection or are being treated for an STD. If you or your partner has herpes, avoid sexual contact when blisters are present. Use condoms at all other times.  If you are at risk of being infected with HIV, it is recommended that you take a prescription medicine daily to prevent HIV infection. This is called pre-exposure prophylaxis (PrEP). You are considered at risk if:  You are a man who has sex with other men (MSM).  You are a heterosexual man or woman who is sexually active with more than one partner.  You take drugs by injection.  You are sexually active with a partner who has HIV.  Talk with your health care provider about whether you are at high risk of being infected with HIV. If you choose to begin PrEP, you should first be tested for HIV. You should then be tested every 3 months for as long as you are taking PrEP.  See your health care provider for regular screenings, exams, and tests for other STDs. Before having sex with a new partner, each of you should  be screened for STDs and should talk about the results with each other. WHAT ARE THE BENEFITS OF SAFE SEX?   There is less chance of getting or giving an STD.  You can prevent unwanted or unintended pregnancies.  By discussing safe sex concerns with your partner, you may increase feelings of intimacy, comfort, trust, and honesty between the two of you. Document Released: 02/26/2004 Document Revised: 06/04/2013 Document Reviewed: 07/12/2011 [facility] Patient Information 2015 [place], [place]. This information is not intended to replace advice given to you by your health care provider. Make sure you discuss any questions you have with your health care provider.

## 2014-11-04 NOTE — ED Notes (Signed)
Pelvic cart at bedside. 

## 2014-11-04 NOTE — ED Provider Notes (Signed)
CSN: [id number]     Arrival date & time 11/04/14  1631 History   First MD Initiated Contact with Patient 11/04/14 1922     Chief Complaint  Patient presents with  . Pelvic Pain  . Chest Pain   HPI  Katrina Stewart is a 19 year old female presenting with pelvic pain and chest pain. Pt states that her pelvic pain began this morning. She states that she feels the pain inside her vagina. It is described as sharp and severe. It occurs intermittently without exacerbating factors. She has tried advil without relief. She is also complaining of green vaginal discharge. Denies dysuria, vaginal itching or hematuria. She states she might have had an STD exposure and would like STD testing. Pt also complaining of left sided, sharp chest pain. She states that she has had this pain for multiple years. The pain is intermittent and worsened with movement or coughing. She states that the pain lasts for a few minutes and then resolves without intervention. She has been seen in the ED for this complaint before but never worked up outpatient.  Denies fevers, chills, headaches, blurred vision, cough, SOB, abdominal pain, nausea, vomiting, diarrhea or rashes.   History reviewed. No pertinent past medical history. Past Surgical History  Procedure Laterality Date  . Mass excision  10/29/2011    Procedure: EXCISION MASS;  Surgeon: Harl Bowie, MD;  Location: [facility] ORS;  Service: General;  Laterality: Bilateral;  excision of bilateral breast masses  . Breast surgery     Family History  Problem Relation Age of Onset  . Cancer Maternal Grandmother     lung cancer  . Cancer Other     bladder cancer   Social History  Substance Use Topics  . Smoking status: Never Smoker   . Smokeless tobacco: None  . Alcohol Use: No   OB History    No data available     Review of Systems  Constitutional: Negative for fever and chills.  HENT: Negative for congestion and rhinorrhea.   Eyes: Negative for visual disturbance.   Respiratory: Negative for shortness of breath and wheezing.   Cardiovascular: Positive for chest pain.  Gastrointestinal: Negative for nausea, vomiting and abdominal pain.  Genitourinary: Positive for vaginal discharge and pelvic pain. Negative for dysuria, hematuria, flank pain and difficulty urinating.  Musculoskeletal: Negative for back pain.  Skin: Negative for rash.  Neurological: Negative for dizziness and headaches.      Allergies  Review of patient's allergies indicates no known allergies.  Home Medications   Prior to Admission medications   Medication Sig Start Date End Date Taking? Authorizing Provider  azithromycin (ZITHROMAX Z-PAK) 250 MG tablet 2 po day one, then 1 daily x 4 days Patient not taking: Reported on 11/04/2014 05/15/14   Milton Ferguson, MD  metroNIDAZOLE (FLAGYL) 500 MG tablet Take 1 tablet (500 mg total) by mouth 2 (two) times daily. 11/04/14   Mamoudou Mulvehill, PA-C  ondansetron (ZOFRAN ODT) 4 MG disintegrating tablet Take 1 tablet (4 mg total) by mouth every 8 (eight) hours as needed for nausea. Patient not taking: Reported on 05/15/2014 01/27/13   Larene Pickett, PA-C   BP 107/69 mmHg  Pulse 77  Temp(Src) 98.8 F (37.1 C) (Oral)  Resp 23  Ht 5\' 8"  (1.727 m)  Wt 230 lb (104.327 kg)  BMI 34.98 kg/m2  SpO2 100%  LMP 10/28/2014 Physical Exam  Constitutional: She appears well-developed and well-nourished. No distress.  HENT:  Head: Normocephalic and atraumatic.  Eyes: Conjunctivae are normal. Right eye exhibits no discharge. Left eye exhibits no discharge. No scleral icterus.  Neck: Normal range of motion.  Cardiovascular: Normal rate, regular rhythm and normal heart sounds.   Pulmonary/Chest: Effort normal and breath sounds normal. No respiratory distress. She has no wheezes. She has no rales. She exhibits tenderness.  Reproducible left anterior wall tenderness  Abdominal: Soft. Bowel sounds are normal. She exhibits no distension. There is no tenderness.  There is no rebound and no guarding.  Genitourinary: Uterus normal. There is no rash or lesion on the right labia. There is no rash or lesion on the left labia. Cervix exhibits no motion tenderness, no discharge and no friability. Right adnexum displays no mass and no tenderness. Left adnexum displays no mass and no tenderness. No erythema or tenderness in the vagina. Vaginal discharge (copious thick white discharge) found.  Musculoskeletal: Normal range of motion.  Moves extremities spontaneously and walks with a steady gait  Neurological: She is alert. Coordination normal.  Skin: Skin is warm and dry.  Psychiatric: She has a normal mood and affect. Her behavior is normal.  Nursing note and vitals reviewed.   ED Course  Procedures (including critical care time) Labs Review Labs Reviewed  WET PREP, GENITAL - Abnormal; Notable for the following:    Clue Cells Wet Prep HPF POC MODERATE (*)    All other components within normal limits  URINALYSIS, ROUTINE W REFLEX MICROSCOPIC (NOT AT [facility]) - Abnormal; Notable for the following:    APPearance CLOUDY (*)    Hgb urine dipstick TRACE (*)    All other components within normal limits  CBC WITH DIFFERENTIAL/PLATELET - Abnormal; Notable for the following:    RBC 5.38 (*)    MCV 74.5 (*)    MCH 24.0 (*)    All other components within normal limits  URINE MICROSCOPIC-ADD ON - Abnormal; Notable for the following:    Squamous Epithelial / LPF FEW (*)    Bacteria, UA MANY (*)    All other components within normal limits  HIV ANTIBODY (ROUTINE TESTING)  RPR  POC URINE PREG, ED  GC/CHLAMYDIA PROBE AMP ([place]) NOT AT [facility]    Imaging Review No results found. I have personally reviewed and evaluated these images and lab results as part of my medical decision-making.   EKG Interpretation   Date/Time:  Monday November 04 2014 16:50:18 EDT Ventricular Rate:  85 PR Interval:  147 QRS Duration: 93 QT Interval:  414 QTC Calculation: 492 R  Axis:   47 Text Interpretation:  Sinus rhythm Borderline T abnormalities, anterior  leads Borderline prolonged QT interval Baseline wander in lead(s) II aVR  aVF V4 ED PHYSICIAN INTERPRETATION AVAILABLE IN [facility] Confirmed  by TEST, Record ([id number]) on 11/05/2014 7:51:15 AM      MDM   Final diagnoses:  STD exposure  BV (bacterial vaginosis)  Chest pain, unspecified chest pain type   Pt presenting with pelvic pain and chest pain. Pelvic pain is associated with green vaginal discharge and recent possible STD exposure. Pt also complaining of left sided chest pain. She states she has had this pain intermittently for many years without outpatient workup. VSS. Lungs CTAB. Chest pain is reproducible with palpation. On pelvic exam there is copious white vaginal discharge. No CMT. Wet prep positive for BV. Pt is also requesting prophylactic treatment for GC. Will treat with flagyl for BV. Chest pain is reproducible and likely musculoskeletal in origin. Pt given [facility]  information to establish primary care and for outpatient evaluation of chest pain. Return precautions given in discharge paperwork and discussed with pt at bedside. Pt stable for discharge    Josephina Gip, PA-C 11/05/14 [place], MD 11/05/14 1731

## 2014-11-05 LAB — GC/CHLAMYDIA PROBE AMP (~~LOC~~) NOT AT ARMC
CHLAMYDIA, DNA PROBE: NEGATIVE
NEISSERIA GONORRHEA: NEGATIVE

## 2014-11-05 LAB — HIV ANTIBODY (ROUTINE TESTING W REFLEX): HIV SCREEN 4TH GENERATION: NONREACTIVE

## 2014-11-05 LAB — RPR: RPR Ser Ql: NONREACTIVE

## 2014-11-28 ENCOUNTER — Emergency Department (HOSPITAL_COMMUNITY)
Admission: EM | Admit: 2014-11-28 | Discharge: 2014-11-29 | Disposition: A | Discharge status: Other Acute Inpatient Hospital | Payer: Federal, State, Local not specified - Other | Attending: Physician Assistant | Admitting: Physician Assistant

## 2014-11-28 ENCOUNTER — Encounter (HOSPITAL_COMMUNITY): Payer: Self-pay | Admitting: Emergency Medicine

## 2014-11-28 DIAGNOSIS — F322 Major depressive disorder, single episode, severe without psychotic features: Secondary | ICD-10-CM | POA: Diagnosis present

## 2014-11-28 DIAGNOSIS — Y9289 Other specified places as the place of occurrence of the external cause: Secondary | ICD-10-CM | POA: Insufficient documentation

## 2014-11-28 DIAGNOSIS — Z792 Long term (current) use of antibiotics: Secondary | ICD-10-CM | POA: Insufficient documentation

## 2014-11-28 DIAGNOSIS — X789XXA Intentional self-harm by unspecified sharp object, initial encounter: Secondary | ICD-10-CM | POA: Insufficient documentation

## 2014-11-28 DIAGNOSIS — Y9389 Activity, other specified: Secondary | ICD-10-CM | POA: Insufficient documentation

## 2014-11-28 DIAGNOSIS — Y998 Other external cause status: Secondary | ICD-10-CM | POA: Insufficient documentation

## 2014-11-28 DIAGNOSIS — F911 Conduct disorder, childhood-onset type: Secondary | ICD-10-CM | POA: Insufficient documentation

## 2014-11-28 DIAGNOSIS — S59912A Unspecified injury of left forearm, initial encounter: Secondary | ICD-10-CM | POA: Insufficient documentation

## 2014-11-28 DIAGNOSIS — T39314A Poisoning by propionic acid derivatives, undetermined, initial encounter: Secondary | ICD-10-CM | POA: Insufficient documentation

## 2014-11-28 DIAGNOSIS — T1491 Suicide attempt: Secondary | ICD-10-CM | POA: Insufficient documentation

## 2014-11-28 LAB — CBC
HEMATOCRIT: 36.5 % (ref 36.0–46.0)
Hemoglobin: 11.8 g/dL — ABNORMAL LOW (ref 12.0–15.0)
MCH: 23.9 pg — AB (ref 26.0–34.0)
MCHC: 32.3 g/dL (ref 30.0–36.0)
MCV: 74 fL — AB (ref 78.0–100.0)
Platelets: 264 10*3/uL (ref 150–400)
RBC: 4.93 MIL/uL (ref 3.87–5.11)
RDW: 14.4 % (ref 11.5–15.5)
WBC: 5.2 10*3/uL (ref 4.0–10.5)

## 2014-11-28 LAB — RAPID URINE DRUG SCREEN, HOSP PERFORMED
AMPHETAMINES: NOT DETECTED
BARBITURATES: NOT DETECTED
Benzodiazepines: NOT DETECTED
Cocaine: NOT DETECTED
OPIATES: NOT DETECTED
TETRAHYDROCANNABINOL: NOT DETECTED

## 2014-11-28 LAB — COMPREHENSIVE METABOLIC PANEL
ALK PHOS: 60 U/L (ref 38–126)
ALT: 18 U/L (ref 14–54)
ANION GAP: 8 (ref 5–15)
AST: 33 U/L (ref 15–41)
Albumin: 4 g/dL (ref 3.5–5.0)
BILIRUBIN TOTAL: 0.3 mg/dL (ref 0.3–1.2)
BUN: 8 mg/dL (ref 6–20)
CALCIUM: 9 mg/dL (ref 8.9–10.3)
CO2: 23 mmol/L (ref 22–32)
Chloride: 112 mmol/L — ABNORMAL HIGH (ref 101–111)
Creatinine, Ser: 1 mg/dL (ref 0.44–1.00)
GFR calc non Af Amer: >60 mL/min (ref >60)
Glucose, Bld: 102 mg/dL — ABNORMAL HIGH (ref 65–99)
Potassium: 3.2 mmol/L — ABNORMAL LOW (ref 3.5–5.1)
Sodium: 143 mmol/L (ref 135–145)
TOTAL PROTEIN: 7.1 g/dL (ref 6.5–8.1)

## 2014-11-28 LAB — ACETAMINOPHEN LEVEL

## 2014-11-28 LAB — CBG MONITORING, ED: Glucose-Capillary: 88 mg/dL (ref 65–99)

## 2014-11-28 LAB — POC URINE PREG, ED: PREG TEST UR: NEGATIVE

## 2014-11-28 LAB — ETHANOL: Alcohol, Ethyl (B): 149 mg/dL — ABNORMAL HIGH (ref <5)

## 2014-11-28 LAB — SALICYLATE LEVEL

## 2014-11-28 MED ORDER — SODIUM CHLORIDE 0.9 % IV BOLUS (SEPSIS)
1000.0000 mL | Freq: Once | INTRAVENOUS | Status: AC
  Administered 2014-11-29: 1000 mL via INTRAVENOUS

## 2014-11-28 NOTE — ED Provider Notes (Signed)
CSN: [id number]     Arrival date & time 11/28/14  2214 History  By signing my name below, I, Evelene Croon, attest that this documentation has been prepared under the direction and in the presence of Tkeyah Burkman Julio Alm, MD . Electronically Signed: Evelene Croon, Scribe. 11/28/2014. 11:47 PM.    Chief Complaint  Patient presents with  . Drug Overdose    etoh and ibuprophen   . Suicidal   LEVEL 5 CAVEAT DUE TO intoxication   The history is provided by a parent and the EMS personnel. No language interpreter was used.    HPI Comments:  Katrina Stewart is a 19 y.o. female who presents to the Emergency Department via EMS. Pt's mom states she received a call this evening from the pt's roommate stating the pt had OD'd on ~ a fifth  of smirnoff and an unknown amount of ibuprofen. Pt also cut her wrist; mom reports h/o similar episodes. Pt is supposed to be on Prozac but hasn't taken it in ~ 1.5 years because she stopped going to the psychiatrist. Mom denies h/o psych hospitalization. Per EMS pt was was given versed and 5mg  of haldol en route. Unable to fully obtain HPI/ROS due to intoxication.  History reviewed. No pertinent past medical history. Past Surgical History  Procedure Laterality Date  . Mass excision  10/29/2011    Procedure: EXCISION MASS;  Surgeon: Harl Bowie, MD;  Location: [facility];  Service: General;  Laterality: Bilateral;  excision of bilateral breast masses  . Breast surgery     Family History  Problem Relation Age of Onset  . Cancer Maternal Grandmother     lung cancer  . Cancer Other     bladder cancer   Social History  Substance Use Topics  . Smoking status: Never Smoker   . Smokeless tobacco: None  . Alcohol Use: No   OB History    No data available     Review of Systems  Unable to perform ROS: Mental status change (intoxication)   Allergies  Review of patient's allergies indicates no known allergies.  Home Medications   Prior to Admission  medications   Medication Sig Start Date End Date Taking? Authorizing Provider  azithromycin (ZITHROMAX Z-PAK) 250 MG tablet 2 po day one, then 1 daily x 4 days Patient not taking: Reported on 11/04/2014 05/15/14   Milton Ferguson, MD  metroNIDAZOLE (FLAGYL) 500 MG tablet Take 1 tablet (500 mg total) by mouth 2 (two) times daily. 11/04/14   Stevi Barrett, PA-C  ondansetron (ZOFRAN ODT) 4 MG disintegrating tablet Take 1 tablet (4 mg total) by mouth every 8 (eight) hours as needed for nausea. Patient not taking: Reported on 05/15/2014 01/27/13   Larene Pickett, PA-C   BP 112/53 mmHg  Temp(Src) 97.5 F (36.4 C) (Rectal)  Resp 16  SpO2 99%  LMP 10/28/2014 Physical Exam  Constitutional: She appears well-developed and well-nourished. No distress.  HENT:  Head: Normocephalic and atraumatic.  Eyes: Conjunctivae are normal.  Pinpoint pupils    Cardiovascular: Normal rate.   Pulmonary/Chest: Effort normal.  Abdominal: She exhibits no distension.  Neurological:  Somnolent  Skin: Skin is warm and dry.  Psychiatric: She has a normal mood and affect.  Nursing note and vitals reviewed.   ED Course  Procedures   DIAGNOSTIC STUDIES:  Oxygen Saturation is 99% on RA, normal by my interpretation.    COORDINATION OF CARE:  11:13 PM w3ill order fluids blood work and ibuprofen level to medically  clear pt for psych.  Discussed treatment plan with parents at bedside.  Labs Review Labs Reviewed  COMPREHENSIVE METABOLIC PANEL - Abnormal; Notable for the following:    Potassium 3.2 (*)    Chloride 112 (*)    Glucose, Bld 102 (*)    All other components within normal limits  ETHANOL - Abnormal; Notable for the following:    Alcohol, Ethyl (B) 149 (*)    All other components within normal limits  ACETAMINOPHEN LEVEL - Abnormal; Notable for the following:    Acetaminophen (Tylenol), Serum <10 (*)    All other components within normal limits  CBC - Abnormal; Notable for the following:    Hemoglobin  11.8 (*)    MCV 74.0 (*)    MCH 23.9 (*)    All other components within normal limits  SALICYLATE LEVEL  URINE RAPID DRUG SCREEN, HOSP PERFORMED  PREGNANCY, URINE  CBG MONITORING, ED  POC URINE PREG, ED    Imaging Review No results found. I have personally reviewed and evaluated these lab results as part of my medical decision-making.   EKG Interpretation   Date/Time:  Thursday November 28 2014 22:22:39 EDT Ventricular Rate:  92 PR Interval:  164 QRS Duration: 98 QT Interval:  373 QTC Calculation: 461 R Axis:   71 Text Interpretation:  Sinus rhythm Left atrial enlargement no acute  ischemia No significant change since last tracing Confirmed by Gerald Leitz ([id number]) on 11/28/2014 11:41:09 PM      MDM   Final diagnoses:  None    Patient is a 19 year old female resenting after suicide attempt. Patient was with her roommate . She reportedly drank one fifth of vodka and then an unknown amount of ibuprofen. On arrival please reported that she was very aggressive. She received 3 of IV Versed as well as Haldol.   Patient is arousable on exam but noncommunicative.   We'll medically screening patient. Patient will require time before she is able to ready for TTS consult.  I personally performed the services described in this documentation, which was scribed in my presence. The recorded information has been reviewed and is accurate.    4:29 AM Have watched patient in monitored bed for 4 hours. Still resting peacefully.  Will move to psych holding and have TTS consult/  Alaiza Yau Julio Alm, MD 11/29/14 [phone]

## 2014-11-28 NOTE — ED Notes (Addendum)
Pt here via EMS. Per EMS pt was was given versed (0.5IM followed by 2.5 IV) and 5mg  of haldol in route. Pt was reportedly combative and has self inflicted cuts to her left forearm. Pt arrived in handcuffs. Per EMS pt has not been taking her prozac for about a year. Per EMS pt reportedly has been drinking alcohol and took an unknown amount of ibuprofen

## 2014-11-28 NOTE — ED Notes (Signed)
Bed: UD14 Expected date:  Expected time:  Means of arrival:  Comments: EMS, intoxicated and combative

## 2014-11-29 ENCOUNTER — Inpatient Hospital Stay (HOSPITAL_COMMUNITY)
Admission: AD | Admit: 2014-11-29 | Discharge: 2014-12-03 | DRG: 885 | Disposition: A | Discharge status: Home / Self Care | Payer: Federal, State, Local not specified - Other | Source: Intra-hospital | Attending: Psychiatry | Admitting: Psychiatry

## 2014-11-29 ENCOUNTER — Encounter (HOSPITAL_COMMUNITY): Payer: Self-pay

## 2014-11-29 DIAGNOSIS — Z801 Family history of malignant neoplasm of trachea, bronchus and lung: Secondary | ICD-10-CM

## 2014-11-29 DIAGNOSIS — F322 Major depressive disorder, single episode, severe without psychotic features: Secondary | ICD-10-CM | POA: Diagnosis present

## 2014-11-29 DIAGNOSIS — F1090 Alcohol use, unspecified, uncomplicated: Secondary | ICD-10-CM

## 2014-11-29 DIAGNOSIS — R45851 Suicidal ideations: Secondary | ICD-10-CM | POA: Diagnosis not present

## 2014-11-29 DIAGNOSIS — IMO0002 Reserved for concepts with insufficient information to code with codable children: Secondary | ICD-10-CM

## 2014-11-29 DIAGNOSIS — F332 Major depressive disorder, recurrent severe without psychotic features: Secondary | ICD-10-CM | POA: Diagnosis present

## 2014-11-29 DIAGNOSIS — F4310 Post-traumatic stress disorder, unspecified: Secondary | ICD-10-CM | POA: Diagnosis present

## 2014-11-29 DIAGNOSIS — F10129 Alcohol abuse with intoxication, unspecified: Secondary | ICD-10-CM | POA: Diagnosis present

## 2014-11-29 DIAGNOSIS — G4710 Hypersomnia, unspecified: Secondary | ICD-10-CM | POA: Diagnosis present

## 2014-11-29 DIAGNOSIS — Z8052 Family history of malignant neoplasm of bladder: Secondary | ICD-10-CM | POA: Diagnosis not present

## 2014-11-29 DIAGNOSIS — Y906 Blood alcohol level of 120-199 mg/100 ml: Secondary | ICD-10-CM | POA: Diagnosis present

## 2014-11-29 DIAGNOSIS — F1099 Alcohol use, unspecified with unspecified alcohol-induced disorder: Secondary | ICD-10-CM | POA: Diagnosis not present

## 2014-11-29 HISTORY — DX: Anxiety disorder, unspecified: F41.9

## 2014-11-29 HISTORY — DX: Depression, unspecified: F32.A

## 2014-11-29 HISTORY — DX: Major depressive disorder, single episode, unspecified: F32.9

## 2014-11-29 MED ORDER — MAGNESIUM HYDROXIDE 400 MG/5ML PO SUSP: 30.0000 mL | Freq: Every day | ORAL | Status: DC | PRN

## 2014-11-29 MED ORDER — ACETAMINOPHEN 325 MG PO TABS: 650.0000 mg | ORAL_TABLET | ORAL | Status: DC | PRN

## 2014-11-29 MED ORDER — ALUM & MAG HYDROXIDE-SIMETH 200-200-20 MG/5ML PO SUSP
30.0000 mL | ORAL | Status: DC | PRN
Start: 2014-11-29 — End: 2014-12-03

## 2014-11-29 MED ORDER — LORAZEPAM 1 MG PO TABS: 1.0000 mg | ORAL_TABLET | Freq: Three times a day (TID) | ORAL | Status: DC | PRN

## 2014-11-29 MED ORDER — FLUOXETINE HCL 10 MG PO CAPS
10.0000 mg | ORAL_CAPSULE | Freq: Every day | ORAL | Status: DC
  Administered 2014-11-29: 10 mg via ORAL
  Filled 2014-11-29: qty 1

## 2014-11-29 MED ORDER — TRAZODONE HCL 100 MG PO TABS
100.0000 mg | ORAL_TABLET | Freq: Every day | ORAL | Status: DC
  Administered 2014-11-30 – 2014-12-02 (×3): 100 mg via ORAL
  Filled 2014-11-29 (×5): qty 1
  Filled 2014-11-29: qty 7

## 2014-11-29 MED ORDER — ONDANSETRON HCL 4 MG PO TABS: 4.0000 mg | ORAL_TABLET | Freq: Three times a day (TID) | ORAL | Status: DC | PRN

## 2014-11-29 MED ORDER — ACETAMINOPHEN 325 MG PO TABS
650.0000 mg | ORAL_TABLET | Freq: Four times a day (QID) | ORAL | Status: DC | PRN
  Administered 2014-11-30 – 2014-12-02 (×4): 650 mg via ORAL
  Filled 2014-11-29 (×4): qty 2

## 2014-11-29 MED ORDER — TRAZODONE HCL 100 MG PO TABS: 100.0000 mg | ORAL_TABLET | Freq: Every day | ORAL | Status: DC

## 2014-11-29 MED ORDER — FLUOXETINE HCL 10 MG PO CAPS
10.0000 mg | ORAL_CAPSULE | Freq: Every day | ORAL | Status: DC
  Administered 2014-11-30 – 2014-12-02 (×3): 10 mg via ORAL
  Filled 2014-11-29 (×4): qty 1

## 2014-11-29 NOTE — ED Notes (Signed)
Called [facility] and left VM to transport pt to [facility]

## 2014-11-29 NOTE — Progress Notes (Signed)
Patient ID: Katrina Stewart, female   DOB: 03-20-1995, 19 y.o.   MRN: [id number] Initial Interdisciplinary Treatment Plan   PATIENT STRESSORS: Loss of grandma, 8-9 years prior   PATIENT STRENGTHS: Ability for insight Capable of independent living Communication skills General fund of knowledge Supportive family/friends   PROBLEM LIST: Problem List/Patient Goals Date to be addressed Date deferred Reason deferred Estimated date of resolution  "Yeah, I would start taking the Prozac again." 12/01/2014      "I've had depression and anxiety since my grandma died 09-12-2022 years ago" 01-Dec-2014     Self harm - cuts on forearm 12-01-14     Increase amount of drinking 12/01/2014                                    DISCHARGE CRITERIA:  Improved stabilization in mood, thinking, and/or behavior Motivation to continue treatment in a less acute level of care Safe-care adequate arrangements made Verbal commitment to aftercare and medication compliance  PRELIMINARY DISCHARGE PLAN: Outpatient therapy Return to previous living arrangement Return to previous work or school arrangements  PATIENT/FAMIILY INVOLVEMENT: This treatment plan has been presented to and reviewed with the patient, Katrina Stewart.The patient and family have been given the opportunity to ask questions and make suggestions.  Elenore Rota Dec 01, 2014, 3:48 PM

## 2014-11-29 NOTE — ED Notes (Signed)
TTS at bedside. 

## 2014-11-29 NOTE — BH Assessment (Addendum)
Tele Assessment Note   Katrina Stewart is an 19 y.o. female presenting to [facility] due to intoxication and self-injurious behaviors.  Pt stated "I been drinking, cut my wrist and taking pills". Pt denies SI, HI and AVH at this time. Pt reported a history of self-injurious behaviors since the age of 46. Pt did not endorse many depressive symptoms but shared that she has a history of depression and has been off of her medication for over 1 year. Pt did not report any issues with her sleep or appetite. Pt did not report any psychiatric hospitalization but shared that she has received therapy in the past.  Pt reported that she drinks alcohol monthly and did not report any illicit substance use. Pt did not report any physical, sexual or emotional abuse at this time. 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   Diagnosis: Major Depressive Disorder, Recurrent; Alcohol intoxication with mild use disorder  Past Medical History: History reviewed. No pertinent past medical history.  Past Surgical History  Procedure Laterality Date  . Mass excision  10/29/2011    Procedure: EXCISION MASS;  Surgeon: Harl Bowie, MD;  Location: [facility];  Service: General;  Laterality: Bilateral;  excision of bilateral breast masses  . Breast surgery      Family History:  Family History  Problem Relation Age of Onset  . Cancer Maternal Grandmother     lung cancer  . Cancer Other     bladder cancer    Social History:  reports that she has never smoked. She does not have any smokeless tobacco history on file. She reports that she does not drink alcohol or use illicit drugs.  Additional Social History:  Alcohol / Drug Use History of alcohol / drug use?: Yes Substance #1 Name of Substance 1: Alcohol  1 - Age of First Use: 16 1 - Amount (size/oz): 1/5 of liquor  1 - Frequency: "monthly"  1 - Duration: ongoing  1 - Last Use / Amount: 11-28-14 BAL-149  CIWA:  CIWA-Ar BP: (!) 112/53 mmHg COWS:    PATIENT STRENGTHS: (choose at least two) Average or above average intelligence Supportive family/friends  Allergies: No Known Allergies  Home Medications:  (Not in a hospital admission)  OB/GYN Status:  Patient's last menstrual period was 10/28/2014.  General Assessment Data Location of Assessment: [facility] TTS Assessment: In system Is this a Tele or Face-to-Face Assessment?: Face-to-Face Is this an Initial Assessment or a Re-assessment for this encounter?: Initial Assessment Marital status: Single Living Arrangements: Non-relatives/Friends Can pt return to current living arrangement?: Yes Admission Status: Voluntary Is patient capable of signing voluntary admission?: Yes Referral Source: Self/Family/Friend Insurance type: None      Crisis Care Plan Living Arrangements: Non-relatives/Friends Name of Psychiatrist: No provider reported.  Name of Therapist: No provider reported   Education Status Is patient currently in school?: No Current Grade: N/A Highest grade of school patient has completed: 5 Name of school: N/A Contact person: N/A  Risk to self with the past 6 months Suicidal Ideation: No Has patient been a risk to self within the past 6 months prior to admission? : No Suicidal Intent: No Has patient had any suicidal intent within the past 6 months prior to admission? : No Is patient at risk for suicide?: No Suicidal Plan?: No Has patient had any suicidal plan within the past 6 months prior to admission? : No Access to Means: No What has been  your use of drugs/alcohol within the last 12 months?: Alcohol use reported.  Previous Attempts/Gestures: No How many times?: 0 Other Self Harm Risks: Cutting  Triggers for Past Attempts: None known Intentional Self Injurious Behavior: Cutting Comment - Self Injurious Behavior: Hx of cutting  Family Suicide History: No Recent stressful life event(s):  (No stressors reported.  ) Persecutory voices/beliefs?: No Depression: No Depression Symptoms: Feeling angry/irritable Substance abuse history and/or treatment for substance abuse?: Yes  Risk to Others within the past 6 months Homicidal Ideation: No Does patient have any lifetime risk of violence toward others beyond the six months prior to admission? : No Thoughts of Harm to Others: No Current Homicidal Intent: No Current Homicidal Plan: No Access to Homicidal Means: No Identified Victim: N/A History of harm to others?: No Assessment of Violence: On admission Violent Behavior Description: No violent behaviors observed at this time.  Does patient have access to weapons?: No Criminal Charges Pending?: No Does patient have a court date: No Is patient on probation?: No  Psychosis Hallucinations: None noted Delusions: None noted  Mental Status Report Appearance/Hygiene: Unable to Assess (Pt has blanket pulled up to her neck. ) Eye Contact: Fair Motor Activity: Freedom of movement Speech: Logical/coherent, Soft Level of Consciousness: Drowsy Mood: Euthymic Affect: Appropriate to circumstance Anxiety Level: None Thought Processes: Coherent, Relevant Judgement: Impaired (BAL-149) Orientation: Appropriate for developmental age Obsessive Compulsive Thoughts/Behaviors: None  Cognitive Functioning Concentration: Normal Memory: Recent Intact, Remote Intact IQ: Average Insight: Poor Impulse Control: Fair Appetite: Good Weight Loss: 0 Weight Gain: 0 Sleep: No Change Total Hours of Sleep: 8 Vegetative Symptoms: Staying in bed  ADLScreening [facility]) Patient's cognitive ability adequate to safely complete daily activities?: Yes Patient able to express need for assistance with ADLs?: Yes Independently performs ADLs?: Yes (appropriate for developmental age)  Prior Inpatient Therapy Prior Inpatient Therapy: No Prior Therapy Dates: N/A Prior Therapy Facilty/Provider(s): N/A Reason  for Treatment: N/A  Prior Outpatient Therapy Prior Outpatient Therapy: Yes Prior Therapy Dates: 2010 Prior Therapy Facilty/Provider(s): [facility]  Reason for Treatment: Depression/Self-injurious behaviors  Does patient have an ACCT team?: No Does patient have Intensive In-House Services?  : No Does patient have [facility] services? : No Does patient have P4CC services?: No  ADL Screening (condition at time of admission) Patient's cognitive ability adequate to safely complete daily activities?: Yes Is the patient deaf or have difficulty hearing?: No Does the patient have difficulty seeing, even when wearing glasses/contacts?: No Does the patient have difficulty concentrating, remembering, or making decisions?: No Patient able to express need for assistance with ADLs?: Yes Does the patient have difficulty dressing or bathing?: No Independently performs ADLs?: Yes (appropriate for developmental age)       Abuse/Neglect Assessment (Assessment to be complete while patient is alone) Physical Abuse: Denies Verbal Abuse: Denies Sexual Abuse: Denies Exploitation of patient/patient's resources: Denies Self-Neglect: Denies          Additional Information 1:1 In Past 12 Months?: Yes CIRT Risk: No Elopement Risk: No Does patient have medical clearance?: Yes     Disposition:  Disposition Initial Assessment Completed for this Encounter: Yes Disposition of Patient: Inpatient treatment program Type of inpatient treatment program: Adult  Glennie Rodda S 11/29/2014 5:14 AM

## 2014-11-29 NOTE — BH Assessment (Signed)
[place] Assessment Progress Note  Per Corena Pilgrim, MD, this pt requires psychiatric hospitalization at this time.  Letitia Libra, RN, [facility] has assigned pt to [facility] Rm [id number].  Pt has signed Voluntary Admission and Consent for Treatment, as well as Consent to Release Information, and signed forms have been faxed to [facility].  Pt's nurse has been notified, and agrees to send original paperwork along with pt via [facility], and to call report to [phone].  Jalene Mullet, [place] Triage Specialist [phone]

## 2014-11-29 NOTE — Progress Notes (Signed)
  D: Pt observed sleeping in bed with eyes closed. RR even and unlabored. No distress noted . Pt woke up very briefly  And went back to sleep.  .  A: Q 15 minute checks were done for safety.  R: safety maintained on unit.

## 2014-11-29 NOTE — Consult Note (Signed)
[facility] Face-to-Face Psychiatry Consult   Reason for Consult:  Suicide attempt Referring Physician:  EDP Patient Identification: Katrina Stewart MRN:  [id number] Principal Diagnosis: Severe major depression, single episode, without psychotic features ([place]) Diagnosis:   Patient Active Problem List   Diagnosis Date Noted  . Severe major depression, single episode, without psychotic features ([place]) [F32.2] 11/29/2014    Priority: High  . Mass of breast, left [N63] 09/22/2011  . Mass of breast, right [N63] 09/22/2011    Total Time spent with patient: 45 minutes  Subjective:   Katrina Stewart is a 19 y.o. female patient admitted with depression and suicidal ideations.  HPI:  19 yo female who presented to the ED after cutting her arms and taking pills in a suicide attempt after drinking alcohol, history of depression.  Pt reported a history of self-injurious behaviors since the age of 70. Pt did not endorse many depressive symptoms but shared that she has a history of depression and has been off of her medication for over 1 year. Pt did not report any issues with her sleep or appetite. Pt did not report any psychiatric hospitalization but shared that she has received therapy in the past. Pt reported that she drinks alcohol monthly and did not report any illicit substance use. Pt did not report any physical, sexual or emotional abuse at this time. 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   Past Psychiatric History: Depression  Risk to Self: Suicidal Ideation: No Suicidal Intent: No Is patient at risk for suicide?: No Suicidal Plan?: No Access to Means: No What has been your use of drugs/alcohol within the last 12 months?: Alcohol use reported.  How many times?: 0 Other Self Harm Risks: Cutting  Triggers for Past Attempts: None known Intentional Self Injurious Behavior: Cutting Comment - Self Injurious Behavior: Hx of cutting  Risk  to Others: Homicidal Ideation: No Thoughts of Harm to Others: No Current Homicidal Intent: No Current Homicidal Plan: No Access to Homicidal Means: No Identified Victim: N/A History of harm to others?: No Assessment of Violence: On admission Violent Behavior Description: No violent behaviors observed at this time.  Does patient have access to weapons?: No Criminal Charges Pending?: No Does patient have a court date: No Prior Inpatient Therapy: Prior Inpatient Therapy: No Prior Therapy Dates: N/A Prior Therapy Facilty/Provider(s): N/A Reason for Treatment: N/A Prior Outpatient Therapy: Prior Outpatient Therapy: Yes Prior Therapy Dates: 2010 Prior Therapy Facilty/Provider(s): [facility]  Reason for Treatment: Depression/Self-injurious behaviors  Does patient have an ACCT team?: No Does patient have Intensive In-House Services?  : No Does patient have [facility] services? : No Does patient have P4CC services?: No  Past Medical History: History reviewed. No pertinent past medical history.  Past Surgical History  Procedure Laterality Date  . Mass excision  10/29/2011    Procedure: EXCISION MASS;  Surgeon: Harl Bowie, MD;  Location: [facility];  Service: General;  Laterality: Bilateral;  excision of bilateral breast masses  . Breast surgery     Family History:  Family History  Problem Relation Age of Onset  . Cancer Maternal Grandmother     lung cancer  . Cancer Other     bladder cancer   Family Psychiatric  History: depression Social History:  History  Alcohol Use No     History  Drug Use No    Social History   Social History  . Marital Status: Single  Spouse Name: N/A  . Number of Children: N/A  . Years of Education: N/A   Social History Main Topics  . Smoking status: Never Smoker   . Smokeless tobacco: None  . Alcohol Use: No  . Drug Use: No  . Sexual Activity: Yes    Birth Control/ Protection: None   Other Topics Concern  . None   Social  History Narrative   Additional Social History:    History of alcohol / drug use?: Yes Name of Substance 1: Alcohol  1 - Age of First Use: 16 1 - Amount (size/oz): 1/5 of liquor  1 - Frequency: "monthly"  1 - Duration: ongoing  1 - Last Use / Amount: 11-28-14 BAL-149                   Allergies:  No Known Allergies  Labs:  Results for orders placed or performed during the hospital encounter of 11/28/14 (from the past 48 hour(s))  Urine rapid drug screen (hosp performed) (Not at [facility])     Status: None   Collection Time: 11/28/14 10:19 PM  Result Value Ref Range   Opiates NONE DETECTED NONE DETECTED   Cocaine NONE DETECTED NONE DETECTED   Benzodiazepines NONE DETECTED NONE DETECTED   Amphetamines NONE DETECTED NONE DETECTED   Tetrahydrocannabinol NONE DETECTED NONE DETECTED   Barbiturates NONE DETECTED NONE DETECTED    Comment:        DRUG SCREEN FOR MEDICAL PURPOSES ONLY.  IF CONFIRMATION IS NEEDED FOR ANY PURPOSE, NOTIFY LAB WITHIN 5 DAYS.        LOWEST DETECTABLE LIMITS FOR URINE DRUG SCREEN Drug Class       Cutoff (ng/mL) Amphetamine      1000 Barbiturate      200 Benzodiazepine   884 Tricyclics       166 Opiates          300 Cocaine          300 THC              50   CBG monitoring, ED     Status: None   Collection Time: 11/28/14 10:28 PM  Result Value Ref Range   Glucose-Capillary 88 65 - 99 mg/dL  Comprehensive metabolic panel     Status: Abnormal   Collection Time: 11/28/14 10:30 PM  Result Value Ref Range   Sodium 143 135 - 145 mmol/L   Potassium 3.2 (L) 3.5 - 5.1 mmol/L   Chloride 112 (H) 101 - 111 mmol/L   CO2 23 22 - 32 mmol/L   Glucose, Bld 102 (H) 65 - 99 mg/dL   BUN 8 6 - 20 mg/dL   Creatinine, Ser 1.00 0.44 - 1.00 mg/dL   Calcium 9.0 8.9 - 10.3 mg/dL   Total Protein 7.1 6.5 - 8.1 g/dL   Albumin 4.0 3.5 - 5.0 g/dL   AST 33 15 - 41 U/L   ALT 18 14 - 54 U/L   Alkaline Phosphatase 60 38 - 126 U/L   Total Bilirubin 0.3 0.3 - 1.2 mg/dL    GFR calc non Af Amer >60 >60 mL/min   GFR calc Af Amer >60 >60 mL/min    Comment: (NOTE) The eGFR has been calculated using the CKD EPI equation. This calculation has not been validated in all clinical situations. eGFR's persistently <60 mL/min signify possible Chronic Kidney Disease.    Anion gap 8 5 - 15  Ethanol (ETOH)     Status: Abnormal  Collection Time: 11/28/14 10:30 PM  Result Value Ref Range   Alcohol, Ethyl (B) 149 (H) <5 mg/dL    Comment:        LOWEST DETECTABLE LIMIT FOR SERUM ALCOHOL IS 5 mg/dL FOR MEDICAL PURPOSES ONLY   Salicylate level     Status: None   Collection Time: 11/28/14 10:30 PM  Result Value Ref Range   Salicylate Lvl <3.8 2.8 - 30.0 mg/dL  Acetaminophen level     Status: Abnormal   Collection Time: 11/28/14 10:30 PM  Result Value Ref Range   Acetaminophen (Tylenol), Serum <10 (L) 10 - 30 ug/mL    Comment:        THERAPEUTIC CONCENTRATIONS VARY SIGNIFICANTLY. A RANGE OF 10-30 ug/mL MAY BE AN EFFECTIVE CONCENTRATION FOR MANY PATIENTS. HOWEVER, SOME ARE BEST TREATED AT CONCENTRATIONS OUTSIDE THIS RANGE. ACETAMINOPHEN CONCENTRATIONS >150 ug/mL AT 4 HOURS AFTER INGESTION AND >50 ug/mL AT 12 HOURS AFTER INGESTION ARE OFTEN ASSOCIATED WITH TOXIC REACTIONS.   CBC     Status: Abnormal   Collection Time: 11/28/14 10:30 PM  Result Value Ref Range   WBC 5.2 4.0 - 10.5 K/uL   RBC 4.93 3.87 - 5.11 MIL/uL   Hemoglobin 11.8 (L) 12.0 - 15.0 g/dL   HCT 36.5 36.0 - 46.0 %   MCV 74.0 (L) 78.0 - 100.0 fL   MCH 23.9 (L) 26.0 - 34.0 pg   MCHC 32.3 30.0 - 36.0 g/dL   RDW 14.4 11.5 - 15.5 %   Platelets 264 150 - 400 K/uL  POC urine preg, ED (not at [facility])     Status: None   Collection Time: 11/28/14 10:35 PM  Result Value Ref Range   Preg Test, Ur NEGATIVE NEGATIVE    Comment:        THE SENSITIVITY OF THIS METHODOLOGY IS >24 mIU/mL     Current Facility-Administered Medications  Medication Dose Route Frequency Provider Last Rate Last Dose  .  acetaminophen (TYLENOL) tablet 650 mg  650 mg Oral Q4H PRN Courteney Lyn Mackuen, MD      . FLUoxetine (PROZAC) capsule 10 mg  10 mg Oral Daily Alexsys Eskin   10 mg at 11/29/14 1107  . ondansetron (ZOFRAN) tablet 4 mg  4 mg Oral Q8H PRN Courteney Lyn Mackuen, MD      . traZODone (DESYREL) tablet 100 mg  100 mg Oral QHS Adelaine Roppolo       Current Outpatient Prescriptions  Medication Sig Dispense Refill  . azithromycin (ZITHROMAX Z-PAK) 250 MG tablet 2 po day one, then 1 daily x 4 days (Patient not taking: Reported on 11/04/2014) 5 tablet 0  . metroNIDAZOLE (FLAGYL) 500 MG tablet Take 1 tablet (500 mg total) by mouth 2 (two) times daily. (Patient not taking: Reported on 11/29/2014) 14 tablet 0  . ondansetron (ZOFRAN ODT) 4 MG disintegrating tablet Take 1 tablet (4 mg total) by mouth every 8 (eight) hours as needed for nausea. (Patient not taking: Reported on 05/15/2014) 10 tablet 0    Musculoskeletal: Strength & Muscle Tone: within normal limits Gait & Station: normal Patient leans: N/A  Psychiatric Specialty Exam: Review of Systems  Constitutional: Negative.   HENT: Negative.   Eyes: Negative.   Respiratory: Negative.   Cardiovascular: Negative.   Gastrointestinal: Negative.   Genitourinary: Negative.   Musculoskeletal: Negative.   Skin: Negative.   Neurological: Negative.   Endo/Heme/Allergies: Negative.   Psychiatric/Behavioral: Positive for depression, suicidal ideas and substance abuse.    Blood pressure 103/55, pulse 86,  temperature 97.5 F (36.4 C), temperature source Rectal, resp. rate 18, last menstrual period 10/28/2014, SpO2 100 %.There is no weight on file to calculate BMI.  General Appearance: Disheveled  Eye Sport and exercise psychologist::  Fair  Speech:  Normal Rate  Volume:  Decreased  Mood:  Depressed  Affect:  Congruent  Thought Process:  Coherent  Orientation:  Full (Time, Place, and Person)  Thought Content:  Rumination  Suicidal Thoughts:  Yes.  with intent/plan   Homicidal Thoughts:  No  Memory:  Immediate;   Fair Recent;   Fair Remote;   Fair  Judgement:  Impaired  Insight:  Fair  Psychomotor Activity:  Decreased  Concentration:  Fair  Recall:  [employer] of Knowledge:Fair  Language: Good  Akathisia:  No  Handed:  Right  AIMS (if indicated):     Assets:  Housing Leisure Time Physical Health Resilience Social Support Vocational/Educational  ADL's:  Intact  Cognition: WNL  Sleep:      Treatment Plan Summary: Daily contact with patient to assess and evaluate symptoms and progress in treatment, Medication management and Plan Major depression, recurrent, severe, without psychosis: -Crisis stabilization -Medication management:  Prozac 10 mg daily for depression started -Individual counseling  Disposition: Recommend psychiatric Inpatient admission when medically cleared.  Waylan Boga, [place] 11/29/2014 11:25 AM Patient seen face-to-face for psychiatric evaluation, chart reviewed and case discussed with the physician extender and developed treatment plan. Reviewed the information documented and agree with the treatment plan. Corena Pilgrim, MD

## 2014-11-29 NOTE — Progress Notes (Signed)
CM spoke with pt who confirms uninsured [employer] resident with no pcp.  CM discussed and provided written information for uninsured accepting pcps, discussed the importance of pcp vs EDP services for f/u care, www.needymeds.org, www.goodrx.com, discounted pharmacies and other [employer] such as [facility] [employer], affordable care act, financial assistance, uninsured dental services, [place] med assist, DSS and  health department  Reviewed resources for [employer] uninsured accepting pcps like [facility], family medicine at [employer], [facility], [facility], local urgent care centers, [facility], [facility] family practice, general medical clinics, family services of the [place], [facility] urgent care plus others, medication resources, [facility] out patient pharmacies and housing Pt voiced understanding and appreciation of resources provided   Provided P4CC contact information Pt asleep Mother at bedside states information will be reviewed and will find CM if questions

## 2014-11-29 NOTE — ED Notes (Signed)
Pt ambulates to BR and back to room w/o assistance but is accompanied by sitter. Pt given paper scrubs and notified of procedure. Pt belongings will be given to friend at bedside to be taken home.

## 2014-11-29 NOTE — BH Assessment (Signed)
Pt mom expressed some concern about pt missing days at work due to treatment. She reported that pt is currently on probation and she did not want pt to lose her job due to missing days. This Probation officer inquired about pt being excused with a doctor's note and pt mother reported that she was unsure and requested that this writer speak with pt when pt is alert. This Probation officer informed pt's mother that she will pass the message to the oncoming staff.

## 2014-11-29 NOTE — ED Notes (Signed)
Sitter at bedside.

## 2014-11-29 NOTE — BH Assessment (Addendum)
Assessment completed. Consulted Arlester Marker, NP who recommended inpatient treatment for stabilization. Informed Dr. Thomasene Lot of the recommendation. Informed pt and her mother of treatment recommendation and both are agreeable to plan.
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
## 2014-11-29 NOTE — ED Notes (Signed)
Pt and family made aware of bed assignment and transfer to [facility]

## 2014-11-30 DIAGNOSIS — F1090 Alcohol use, unspecified, uncomplicated: Secondary | ICD-10-CM

## 2014-11-30 DIAGNOSIS — F332 Major depressive disorder, recurrent severe without psychotic features: Principal | ICD-10-CM

## 2014-11-30 DIAGNOSIS — R45851 Suicidal ideations: Secondary | ICD-10-CM

## 2014-11-30 DIAGNOSIS — IMO0002 Reserved for concepts with insufficient information to code with codable children: Secondary | ICD-10-CM

## 2014-11-30 DIAGNOSIS — F1099 Alcohol use, unspecified with unspecified alcohol-induced disorder: Secondary | ICD-10-CM

## 2014-11-30 NOTE — [facility] Group Notes (Signed)
[facility] Notes: (Clinical Social Work)   11/30/2014      Type of Therapy:  Group Therapy   Participation Level:  Did Not Attend despite MHT prompting   Selmer Dominion, LCSW 11/30/2014, 2:40 PM

## 2014-11-30 NOTE — Progress Notes (Signed)
D) Pt has been attending the program, participates,  and interacts with select peers. Affect is flat and mood remains depressed. Denies SI and HI. Denies thoughts of hurting herself and contracts for her safety. Pt rates her depression at a 5, hopelessness at a 0 and anxiety at a 5. Pt tends to be withdrawn and stays in her room sleeping A) given support, reassurance and praise along with encouragement. Provided with a 1:1. R) Denies SI and HI. Continues to be withdraw.

## 2014-11-30 NOTE — H&P (Signed)
Psychiatric Admission Assessment Adult  Patient Identification: Katrina Stewart MRN:  [id number] Date of Evaluation:  11/30/2014 Chief Complaint:  ALCOHOL INTOXICATION WITH MILD USE DISORDER MDD Principal Diagnosis: Major depressive disorder, recurrent episode, severe ([place]) Diagnosis:   Patient Active Problem List   Diagnosis Date Noted  . Major depressive disorder, recurrent episode, severe ([place]) [F33.2] 11/29/2014    Priority: High  . Alcohol use disorder ([place]) [F10.99]     Priority: Medium  . Severe major depression, single episode, without psychotic features ([place]) [F32.2] 11/29/2014  . Mass of breast, left [N63] 09/22/2011  . Mass of breast, right [N63] 09/22/2011   History of Present Illness:Per Assessment Note-Cason Loftin is an 19 y.o. female presenting to [facility] due to intoxication and self-injurious behaviors. Pt stated "I been drinking, cut my wrist and taking pills". Pt denies SI, HI and AVH at this time. Pt reported a history of self-injurious behaviors since the age of 19. Pt did not endorse many depressive symptoms but shared that she has a history of depression and has been off of her medication for over 1 year. Pt did not report any issues with her sleep or appetite. Pt did not report any psychiatric hospitalization but shared that she has received therapy in the past. Pt reported that she drinks alcohol monthly and did not report any illicit substance use. Pt did not report any physical, sexual or emotional abuse at this time. 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 Reports taken Prozac in the past which helped.States she has been depressed for a while however is unsure of the reason. Reports her girlfriend has been "put in jail" recently which caused her to sleep most of the day. Reports sleeping from 8am to 9pm until its  time to go back to work. Patients has multipliable superficial/scaring to left anterior forearm, from past and current razor, knife and sharp objects cut. Reports history of cutting for pleasure. States past therapy as helped reports no longer attends due to financial issues. Patient reports family supports and plans to attends [employer] in the spring for Nursing. Patient denies auditory/ visual hallucination.  Associated Signs/Symptoms: Depression Symptoms:  depressed mood, hypersomnia, feelings of worthlessness/guilt, difficulty concentrating, hopelessness, loss of energy/fatigue, disturbed sleep, decreased appetite, (Hypo) Manic Symptoms:  Impulsivity, Irritable Mood, Anxiety Symptoms:  Excessive Worry, Social Anxiety, Psychotic Symptoms:  Hallucinations: None PTSD Symptoms: Negative Total Time spent with patient: 45 minutes  Past Psychiatric History: Depression, Anxiety, Alcohol abuse  Risk to Self: Is patient at risk for suicide?: No Risk to Others:   Prior Inpatient Therapy:   Prior Outpatient Therapy:    Alcohol Screening: 1. How often do you have a drink containing alcohol?: 4 or more times a week 2. How many drinks containing alcohol do you have on a typical day when you are drinking?: 3 or 4 3. How often do you have six or more drinks on one occasion?: Weekly Preliminary Score: 4 4. How often during the last year have you found that you were not able to stop drinking once you had started?: Never 5. How often during the last year have you failed to do what was normally expected from you becasue of drinking?: Never 6. How often during the last year have you needed a first drink in the morning to get yourself going after a heavy drinking  session?: Never 7. How often during the last year have you had a feeling of guilt of remorse after drinking?: Monthly 8. How often during the last year have you been unable to remember what happened the night  before because you had been drinking?: Weekly 9. Have you or someone else been injured as a result of your drinking?: No 10. Has a relative or friend or a doctor or another health worker been concerned about your drinking or suggested you cut down?: No Alcohol Use Disorder Identification Test Final Score (AUDIT): 13 Brief Intervention: Yes Substance Abuse History in the last 12 months:  Yes.   Consequences of Substance Abuse: Medical Consequences:  Overdose on mediction mixed with EtoH Previous Psychotropic Medications: YES Psychological Evaluations: YES Past Medical History: Depression, Anxiety Past Medical History  Diagnosis Date  . Depression   . Anxiety     Past Surgical History  Procedure Laterality Date  . Mass excision  10/29/2011    Procedure: EXCISION MASS;  Surgeon: Harl Bowie, MD;  Location: [facility];  Service: General;  Laterality: Bilateral;  excision of bilateral breast masses  . Breast surgery     Family History:  Family History  Problem Relation Age of Onset  . Cancer Maternal Grandmother     lung cancer  . Cancer Other     bladder cancer   Family Psychiatric  History: Denies  Social History:  History  Alcohol Use  . 1.2 - 1.8 oz/week  . 2-3 Shots of liquor per week     History  Drug Use No    Social History   Social History  . Marital Status: Single    Spouse Name: N/A  . Number of Children: N/A  . Years of Education: N/A   Social History Main Topics  . Smoking status: Never Smoker   . Smokeless tobacco: None  . Alcohol Use: 1.2 - 1.8 oz/week    2-3 Shots of liquor per week  . Drug Use: No  . Sexual Activity: Yes    Birth Control/ Protection: None   Other Topics Concern  . None   Social History Narrative   Additional Social History:    Pain Medications: n/a Prescriptions: n/a Over the Counter: n/a History of alcohol / drug use?: Yes Longest period of sobriety (when/how long):  (pt denies any abuse) Withdrawal Symptoms: Other  (Comment) (denies) Name of Substance 1: Alcohol  1 - Age of First Use: 16 1 - Amount (size/oz): 2-3 drinks 1 - Frequency: daily 1 - Duration: ongoing  1 - Last Use / Amount: 11/28/14, "I drank the whole bottle."    Allergies:  No Known Allergies Lab Results:  Results for orders placed or performed during the hospital encounter of 11/28/14 (from the past 48 hour(s))  Urine rapid drug screen (hosp performed) (Not at [facility])     Status: None   Collection Time: 11/28/14 10:19 PM  Result Value Ref Range   Opiates NONE DETECTED NONE DETECTED   Cocaine NONE DETECTED NONE DETECTED   Benzodiazepines NONE DETECTED NONE DETECTED   Amphetamines NONE DETECTED NONE DETECTED   Tetrahydrocannabinol NONE DETECTED NONE DETECTED   Barbiturates NONE DETECTED NONE DETECTED    Comment:        DRUG SCREEN FOR MEDICAL PURPOSES ONLY.  IF CONFIRMATION IS NEEDED FOR ANY PURPOSE, NOTIFY LAB WITHIN 5 DAYS.        LOWEST DETECTABLE LIMITS FOR URINE DRUG SCREEN Drug Class       Cutoff (ng/mL)  Amphetamine      1000 Barbiturate      200 Benzodiazepine   431 Tricyclics       540 Opiates          300 Cocaine          300 THC              50   CBG monitoring, ED     Status: None   Collection Time: 11/28/14 10:28 PM  Result Value Ref Range   Glucose-Capillary 88 65 - 99 mg/dL  Comprehensive metabolic panel     Status: Abnormal   Collection Time: 11/28/14 10:30 PM  Result Value Ref Range   Sodium 143 135 - 145 mmol/L   Potassium 3.2 (L) 3.5 - 5.1 mmol/L   Chloride 112 (H) 101 - 111 mmol/L   CO2 23 22 - 32 mmol/L   Glucose, Bld 102 (H) 65 - 99 mg/dL   BUN 8 6 - 20 mg/dL   Creatinine, Ser 1.00 0.44 - 1.00 mg/dL   Calcium 9.0 8.9 - 10.3 mg/dL   Total Protein 7.1 6.5 - 8.1 g/dL   Albumin 4.0 3.5 - 5.0 g/dL   AST 33 15 - 41 U/L   ALT 18 14 - 54 U/L   Alkaline Phosphatase 60 38 - 126 U/L   Total Bilirubin 0.3 0.3 - 1.2 mg/dL   GFR calc non Af Amer >60 >60 mL/min   GFR calc Af Amer >60 >60 mL/min     Comment: (NOTE) The eGFR has been calculated using the CKD EPI equation. This calculation has not been validated in all clinical situations. eGFR's persistently <60 mL/min signify possible Chronic Kidney Disease.    Anion gap 8 5 - 15  Ethanol (ETOH)     Status: Abnormal   Collection Time: 11/28/14 10:30 PM  Result Value Ref Range   Alcohol, Ethyl (B) 149 (H) <5 mg/dL    Comment:        LOWEST DETECTABLE LIMIT FOR SERUM ALCOHOL IS 5 mg/dL FOR MEDICAL PURPOSES ONLY   Salicylate level     Status: None   Collection Time: 11/28/14 10:30 PM  Result Value Ref Range   Salicylate Lvl <0.8 2.8 - 30.0 mg/dL  Acetaminophen level     Status: Abnormal   Collection Time: 11/28/14 10:30 PM  Result Value Ref Range   Acetaminophen (Tylenol), Serum <10 (L) 10 - 30 ug/mL    Comment:        THERAPEUTIC CONCENTRATIONS VARY SIGNIFICANTLY. A RANGE OF 10-30 ug/mL MAY BE AN EFFECTIVE CONCENTRATION FOR MANY PATIENTS. HOWEVER, SOME ARE BEST TREATED AT CONCENTRATIONS OUTSIDE THIS RANGE. ACETAMINOPHEN CONCENTRATIONS >150 ug/mL AT 4 HOURS AFTER INGESTION AND >50 ug/mL AT 12 HOURS AFTER INGESTION ARE OFTEN ASSOCIATED WITH TOXIC REACTIONS.   CBC     Status: Abnormal   Collection Time: 11/28/14 10:30 PM  Result Value Ref Range   WBC 5.2 4.0 - 10.5 K/uL   RBC 4.93 3.87 - 5.11 MIL/uL   Hemoglobin 11.8 (L) 12.0 - 15.0 g/dL   HCT 36.5 36.0 - 46.0 %   MCV 74.0 (L) 78.0 - 100.0 fL   MCH 23.9 (L) 26.0 - 34.0 pg   MCHC 32.3 30.0 - 36.0 g/dL   RDW 14.4 11.5 - 15.5 %   Platelets 264 150 - 400 K/uL  POC urine preg, ED (not at [facility])     Status: None   Collection Time: 11/28/14 10:35 PM  Result Value Ref Range  Preg Test, Ur NEGATIVE NEGATIVE    Comment:        THE SENSITIVITY OF THIS METHODOLOGY IS >24 mIU/mL     Metabolic Disorder Labs:  No results found for: HGBA1C, MPG No results found for: PROLACTIN Lab Results  Component Value Date   CHOL 153 07/18/2009   TRIG 78 07/18/2009   HDL 47  07/18/2009   CHOLHDL 3.3 Ratio 07/18/2009   VLDL 16 07/18/2009   LDLCALC 90 07/18/2009    Current Medications: Current Facility-Administered Medications  Medication Dose Route Frequency Provider Last Rate Last Dose  . acetaminophen (TYLENOL) tablet 650 mg  650 mg Oral Q6H PRN Patrecia Pour, NP      . alum & mag hydroxide-simeth (MAALOX/MYLANTA) 200-200-20 MG/5ML suspension 30 mL  30 mL Oral Q4H PRN Patrecia Pour, NP      . FLUoxetine (PROZAC) capsule 10 mg  10 mg Oral Daily Patrecia Pour, NP   10 mg at 11/30/14 0825  . magnesium hydroxide (MILK OF MAGNESIA) suspension 30 mL  30 mL Oral Daily PRN Patrecia Pour, NP      . traZODone (DESYREL) tablet 100 mg  100 mg Oral QHS Patrecia Pour, NP   100 mg at 11/30/14 0201   PTA Medications: Prescriptions prior to admission  Medication Sig Dispense Refill Last Dose  . ondansetron (ZOFRAN ODT) 4 MG disintegrating tablet Take 1 tablet (4 mg total) by mouth every 8 (eight) hours as needed for nausea. (Patient not taking: Reported on 05/15/2014) 10 tablet 0 Completed Course at Unknown time    Musculoskeletal: Strength & Muscle Tone: within normal limits Gait & Station: normal Patient leans: N/A  Psychiatric Specialty Exam: Physical Exam  Nursing note and vitals reviewed. Constitutional: She is oriented to person, place, and time. She appears well-developed and well-nourished.  HENT:  Head: Normocephalic and atraumatic.  Eyes: EOM are normal.  Neck: Normal range of motion. Neck supple.  Cardiovascular: Normal rate and regular rhythm.   Respiratory: Effort normal and breath sounds normal.  GI: Soft. Bowel sounds are normal.  Musculoskeletal: Normal range of motion.  Neurological: She is alert and oriented to person, place, and time.  Skin: Skin is warm and dry.  Multipliable superficial/scaring to left anterior forearm, from past and current razor, knife and sharp objects cuts    Review of Systems  Constitutional: Negative.   HENT:  Negative.   Eyes: Negative.   Gastrointestinal: Negative.   Musculoskeletal: Negative.   Skin:       Multipliable superficial/scaring to left anterior forearm, from past and current razor, knife and sharp objects cut  Psychiatric/Behavioral: Positive for depression, suicidal ideas and substance abuse. The patient is nervous/anxious.   All other systems reviewed and are negative.   Blood pressure 120/68, pulse 106, temperature 98.7 F (37.1 C), temperature source Oral, resp. rate 18, height 5' 8.25" (1.734 m), weight 99.791 kg (220 lb), last menstrual period 10/28/2014, SpO2 99 %.Body mass index is 33.19 kg/(m^2).  General Appearance: Casual and Guarded  Eye Contact::  Fair  Speech:  Clear and Coherent and Normal Rate  Volume:  Normal  Mood:  Anxious, Depressed and Hopeless  Affect:  Depressed  Thought Process:  Coherent  Orientation:  Full (Time, Place, and Person)  Thought Content:  Hallucinations: None  Suicidal Thoughts:  Yes.  without intent/plan  Homicidal Thoughts:  No  Memory:  Immediate;   Fair  Judgement:  Poor  Insight:  Negative and Lacking  Psychomotor Activity:  NA  Concentration:  Poor  Recall:  Good  Fund of Knowledge:Fair  Language: Fair  Akathisia:  Negative  Handed:  Right  AIMS (if indicated):     Assets:  Communication Skills Desire for Improvement Financial Resources/Insurance Physical Health Social Support Vocational/Educational  ADL's:  Intact  Cognition: WNL  Sleep:        Treatment Plan Summary: 1. Daily contact with patient to assess and evaluate symptoms and progress in treatment and Medication management Admit for crisis management and stabilization 2. Medication management to reduce current symptoms to bale line and improve the patient's overall level of functioning, Patient started on Prozac 10 mg daily for depression. 3. Treat health problems as indicated 4. Develop treatment plan to decrease risk of relapse upon discharge and the need  for readmission. 5. Psycho-social education regarding relapse prevention and self care. 6. Health care follow up as needed for medical problems Restart home medications where appropriate.    Observation Level/Precautions:  15 minute checks  Laboratory:  CBC Chemistry Profile UDS UA  Psychotherapy:  Individual and Group therapy/session  Medications:  Prozac 10 mg daily started   Consultations:  Psychiatry  Discharge Concerns:  Safety, stabilization, and risk of access to medication and medication stabilization   Estimated LOS: 5-7 days  Other:     I certify that inpatient services furnished can reasonably be expected to improve the patient's condition.   Derrill Center 10/29/201611:34 AM I have examined the patient and agreed with the findings of H&P and treatment plan. I have also done suicide assessment on this patient.

## 2014-11-30 NOTE — [facility] Suicide Risk Assessment (Signed)
[facility] Admission Suicide Risk Assessment   Nursing information obtained from:    Demographic factors:    Current Mental Status:    Loss Factors:    Historical Factors:    Risk Reduction Factors:    Total Time spent with patient: 1.5 hours Principal Problem: <principal problem not specified> Diagnosis:   Patient Active Problem List   Diagnosis Date Noted  . Severe major depression, single episode, without psychotic features ([place]) [F32.2] 11/29/2014  . Major depressive disorder, recurrent episode, severe ([place]) [F33.2] 11/29/2014  . Mass of breast, left [N63] 09/22/2011  . Mass of breast, right [N63] 09/22/2011     Continued Clinical Symptoms:  Alcohol Use Disorder Identification Test Final Score (AUDIT): 13 The "Alcohol Use Disorders Identification Test", Guidelines for Use in Primary Care, Second Edition.  World Pharmacologist [facility]). Score between 0-7:  no or low risk or alcohol related problems. Score between 8-15:  moderate risk of alcohol related problems. Score between 16-19:  high risk of alcohol related problems. Score 20 or above:  warrants further diagnostic evaluation for alcohol dependence and treatment.   CLINICAL FACTORS:   Severe Anxiety and/or Agitation Depression:   Anhedonia Comorbid alcohol abuse/dependence Hopelessness Impulsivity Insomnia Dysthymia Alcohol/Substance Abuse/Dependencies More than one psychiatric diagnosis Unstable or Poor Therapeutic Relationship   Musculoskeletal: Strength & Muscle Tone: within normal limits Gait & Station: normal Patient leans: N/A  Psychiatric Specialty Exam: Physical Exam  Constitutional: She appears well-developed and well-nourished.  HENT:  Head: Normocephalic.    Review of Systems  Constitutional: Negative.   Cardiovascular: Negative for chest pain.  Skin: Negative for rash.  Psychiatric/Behavioral: Positive for depression and substance abuse. The patient has insomnia.     Blood pressure 120/68,  pulse 106, temperature 98.7 F (37.1 C), temperature source Oral, resp. rate 18, height 5' 8.25" (1.734 m), weight 99.791 kg (220 lb), last menstrual period 10/28/2014, SpO2 99 %.Body mass index is 33.19 kg/(m^2).  General Appearance: Casual  Eye Contact::  Fair  Speech:  Slow  Volume:  Decreased  Mood:  Dysphoric  Affect:  Congruent and Constricted  Thought Process:  Coherent  Orientation:  Full (Time, Place, and Person)  Thought Content:  Rumination  Suicidal Thoughts:  No  Homicidal Thoughts:  No  Memory:  Immediate;   Fair Recent;   Fair  Judgement:  Poor  Insight:  Shallow  Psychomotor Activity:  Decreased  Concentration:  Fair  Recall:  [place]: Fair  Akathisia:  Negative  Handed:  Right  AIMS (if indicated):     Assets:  Desire for Improvement Physical Health  Sleep:     Cognition: WNL  ADL's:  Intact     COGNITIVE FEATURES THAT CONTRIBUTE TO RISK:  Closed-mindedness and Polarized thinking    SUICIDE RISK:   Moderate:  Frequent suicidal ideation with limited intensity, and duration, some specificity in terms of plans, no associated intent, good self-control, limited dysphoria/symptomatology, some risk factors present, and identifiable protective factors, including available and accessible social support.  PLAN OF CARE: Daily contact with patient to assess and evaluate symptoms and progress in treatment, Medication management and Plan Major depression, recurrent, severe, without psychosis: -Crisis stabilization -Medication management: Prozac 10 mg daily for depression started. Plan to increase accordingly -Individual counseling  Medical Decision Making:  Review of Psycho-Social Stressors (1), Decision to obtain old records (1), Review of Medication Regimen & Side Effects (2) and Review of New Medication or Change in Dosage (2)  I  certify that inpatient services furnished can reasonably be expected to improve the patient's condition.    Katrina Stewart 11/30/2014, 10:19 AM

## 2014-12-01 ENCOUNTER — Encounter (HOSPITAL_COMMUNITY): Payer: Self-pay | Admitting: Family

## 2014-12-01 NOTE — [facility] Group Notes (Signed)
[place] Group Notes:  (Clinical Social Work)  12/01/2014  1:30-2:30pm  Summary of Progress/Problems:   The main focus of today's process group was to   1)  discuss the importance of adding supports  2)  define health supports versus unhealthy supports  3)  identify the patient's current unhealthy supports and plan how to handle them  4)  Identify the patient's current healthy supports and plan what to add.  An emphasis was placed on using counselor, doctor, therapy groups, 12-step groups, and problem-specific support groups to expand supports.    The patient expressed full comprehension of the concepts presented, and agreed that there is a need to add more supports.  The patient stated she wishes her mother understood her illness, because now her mother just thinks she does things for attention.  Type of Therapy:  Process Group with Motivational Interviewing  Participation Level:  Active  Participation Quality:  Attentive  Affect:  Blunted and Depressed  Cognitive:  Appropriate and Oriented  Insight:  Engaged  Engagement in Therapy:  Engaged  Modes of Intervention:   Education, Support and Processing, Activity  Selmer Dominion, LCSW 12/01/2014

## 2014-12-01 NOTE — Progress Notes (Signed)
Adult Psychoeducational Group Note  Date:  12/01/2014 Time:  8:58 PM  Group Topic/Focus:  Wrap-Up Group:   The focus of this group is to help patients review their daily goal of treatment and discuss progress on daily workbooks.  Participation Level:  Active  Participation Quality:  Appropriate and Attentive  Affect:  Appropriate  Cognitive:  Appropriate  Insight: Appropriate and Good  Engagement in Group:  Engaged  Modes of Intervention:  Education  Additional Comments:  Pt overall had a good day. Pt goal was to stay out the room and interact more. Pt also set a goal to attend all the group meetings.   Katrina Stewart 12/01/2014, 8:58 PM
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
## 2014-12-01 NOTE — Plan of Care (Signed)
Problem: Diagnosis: Increased Risk For Suicide Attempt Goal: STG-Patient Will Attend All Groups On The Unit Outcome: Progressing Patient attended evening group.

## 2014-12-01 NOTE — Progress Notes (Signed)
  Psychoeducational Group Note  Date: 12/01/2014 Time:  0930 Group Topic/Focus:  Gratefulness:  The focus of this group is to help patients identify what two things they are most grateful for in their lives. What helps ground them and to center them on their work to their recovery.  Participation Level:  Active  Participation Quality:  Appropriate  Affect:  Appropriate  Cognitive:  Oriented  Insight:  Improving  Engagement in Group:  Engaged  Additional Comments:  Pt was attentive and participated in the group.   Paulino Rily

## 2014-12-01 NOTE — Progress Notes (Signed)
D) Pt has attended the groups and has participated. Affect is flat and mood depressed. Pt states she has always been quiet and withdrawn and not as interactive as others. "I am just a quiet person and stay to myself pretty much. Pt rates her depression at a 5, her hopelessness at a 0 and her anxiety at a 3. States, "I feel much better and I am ready to go home" A) given support, reassurance and praise. Provided with a 1:1. Encouragement given. R) Pt denies SI and HI.

## 2014-12-01 NOTE — Progress Notes (Signed)
Patient has been up in the dayroom this evening after her mother visited. She attended group this evening and has voiced no complaints. Patient currently denies having pain, -si/hi/a/v hall. Support and encouragement offered, safety maintained on unit, will continue to monitor.

## 2014-12-01 NOTE — BHH Group Notes (Signed)
[place] Group Notes:  (Nursing/MHT/Case Management/Adjunct)  Date:  12/01/2014  Time:  1045  Type of Therapy:  Nurse Education  Participation Level:  Active  Participation Quality:  Attentive  Affect:  Anxious  Cognitive:  Disorganized  Insight:  Appropriate  Engagement in Group:  Engaged  Modes of Intervention:  Education  Summary of Progress/Problems:  Lauralyn Primes 12/01/2014, 1:51 PM

## 2014-12-01 NOTE — Plan of Care (Signed)
Problem: Alteration in mood & ability to function due to Goal: LTG-Pt reports reduction in suicidal thoughts (Patient reports reduction in suicidal thoughts and is able to verbalize a safety plan for whenever patient is feeling suicidal)  Outcome: Progressing Patient currently denies suicidal ideations.     

## 2014-12-01 NOTE — Progress Notes (Signed)
Writer spoke with patient 1:1 before she attended evening group. She reports having had a good day and her mother visited her on tonight. She reports that she slept quite a bit today and missed going outside. She is hoping to discharge soon and currently denies si/hui/a/v hallucinations. She c/o her left hand aching this evening and received tylenol and heat pack for pain. Support and encouragement given, safety maintained on unit with 15 min checks.

## 2014-12-01 NOTE — Progress Notes (Signed)
D) Pt has been up in the dayroom this morning. States she is feeling depressed but less so than in the recent past. States, "I am usually quiet and don't talk a lot in group settings. I stay to myself a lot and that is just how I am normally". Does admit to feeling depressed. Rates depression at a 5, hopelessness at a 0 and her anxiety at a 3. Denies SI and HI, auditory and visual hallucinations.  A) Given support, reassurance and praise. Encouragement provided. Provided a 1:1. R) Denies SI and HI.

## 2014-12-01 NOTE — Progress Notes (Signed)
[facility] MD Progress Note  12/01/2014 12:48 PM Katrina Stewart  MRN:  [id number] Subjective:History of Present Illness:Per Assessment Note-Katrina Stewart is an 19 y.o. female presenting to [facility] due to intoxication and self-injurious behaviors. Pt stated "I been drinking, cut my wrist and taking pills". Pt denies SI, HI and AVH at this time. Pt reported a history of self-injurious behaviors since the age of 38. Pt did not endorse many depressive symptoms but shared that she has a history of depression and has been off of her medication for over 1 year. Pt did not report any issues with her sleep or appetite. Pt did not report any psychiatric hospitalization but shared that she has received therapy in the past. Pt reported that she drinks alcohol monthly and did not report any illicit substance use. Pt did not report any physical, sexual or emotional abuse at this time. 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 Patient is presenting with flat and guarded affect.Labs nursing notes reviewed, Patient is quite and isolative. Denies suicidal/ homicidal ideation. Started Prozac 10 mgs denies any medications side effects. Patient reports attending group therapy session and has learned some coping skills to help her thoughts to cut. Patient states she has learned to " just roll with the punches" still presenting with depression feeling reports 5/10. Patient denies auditory or  visual hallucination. States that she is attending/participating in group sessions and her appetite is good. Reports that she is sleeping without difficulty. Offered different copying skills to handle her thoughts of depression. coping skills encouraged. Support, encouragement and reassurance provided.  Principal Problem: Major depressive disorder, recurrent episode, severe ([place]) Diagnosis:   Patient Active Problem List    Diagnosis Date Noted  . Major depressive disorder, recurrent episode, severe ([place]) [F33.2] 11/29/2014    Priority: High  . Alcohol use disorder ([place]) [F10.99]     Priority: Medium  . Severe major depression, single episode, without psychotic features ([place]) [F32.2] 11/29/2014  . Mass of breast, left [N63] 09/22/2011  . Mass of breast, right [N63] 09/22/2011   Total Time spent with patient: 45 minutes  Past Psychiatric History:  Depression, Anxiety  Past Medical History:  Past Medical History  Diagnosis Date  . Depression   . Anxiety     Past Surgical History  Procedure Laterality Date  . Mass excision  10/29/2011    Procedure: EXCISION MASS;  Surgeon: Harl Bowie, MD;  Location: [facility] ORS;  Service: General;  Laterality: Bilateral;  excision of bilateral breast masses  . Breast surgery     Family History:  Family History  Problem Relation Age of Onset  . Cancer Maternal Grandmother     lung cancer  . Cancer Other     bladder cancer   Family Psychiatric  History: patient denies family history Social History:  History  Alcohol Use  . 1.2 - 1.8 oz/week  . 2-3 Shots of liquor per week     History  Drug Use No    Social History   Social History  . Marital Status: Single    Spouse Name: N/A  . Number of Children: N/A  . Years of Education: N/A   Social History Main Topics  . Smoking status: Never Smoker   . Smokeless tobacco: None  . Alcohol Use: 1.2 - 1.8 oz/week    2-3 Shots of liquor per week  . Drug Use:  No  . Sexual Activity: Yes    Birth Control/ Protection: None   Other Topics Concern  . None   Social History Narrative   Additional Social History:    Pain Medications: n/a Prescriptions: n/a Over the Counter: n/a History of alcohol / drug use?: Yes Longest period of sobriety (when/how long):  (pt denies any abuse) Withdrawal Symptoms: Other (Comment) (denies) Name of Substance 1: Alcohol  1 - Age of First Use: 16 1 - Amount (size/oz):  2-3 drinks 1 - Frequency: daily 1 - Duration: ongoing  1 - Last Use / Amount: 11/28/14, "I drank the whole bottle."         Sleep: Fair  Appetite:  Fair  Current Medications: Current Facility-Administered Medications  Medication Dose Route Frequency Provider Last Rate Last Dose  . acetaminophen (TYLENOL) tablet 650 mg  650 mg Oral Q6H PRN Patrecia Pour, NP   650 mg at 12/01/14 1211  . alum & mag hydroxide-simeth (MAALOX/MYLANTA) 200-200-20 MG/5ML suspension 30 mL  30 mL Oral Q4H PRN Patrecia Pour, NP      . FLUoxetine (PROZAC) capsule 10 mg  10 mg Oral Daily Patrecia Pour, NP   10 mg at 12/01/14 0741  . magnesium hydroxide (MILK OF MAGNESIA) suspension 30 mL  30 mL Oral Daily PRN Patrecia Pour, NP      . traZODone (DESYREL) tablet 100 mg  100 mg Oral QHS Patrecia Pour, NP   100 mg at 11/30/14 2134    Lab Results: No results found for this or any previous visit (from the past 48 hour(s)).  Physical Findings: AIMS: Facial and Oral Movements Muscles of Facial Expression: None, normal Lips and Perioral Area: None, normal Jaw: None, normal Tongue: None, normal,Extremity Movements Upper (arms, wrists, hands, fingers): None, normal Lower (legs, knees, ankles, toes): None, normal, Trunk Movements Neck, shoulders, hips: None, normal, Overall Severity Severity of abnormal movements (highest score from questions above): None, normal Incapacitation due to abnormal movements: None, normal Patient's awareness of abnormal movements (rate only patient's report): No Awareness, Dental Status Current problems with teeth and/or dentures?: No Does patient usually wear dentures?: No  CIWA:   0 COWS:   0  Musculoskeletal: Strength & Muscle Tone: within normal limits Gait & Station: normal Patient leans: N/A  Psychiatric Specialty Exam: Review of Systems  Constitutional: Negative.   Eyes: Negative.   Respiratory: Negative.   Cardiovascular: Negative.   Gastrointestinal: Negative.    Genitourinary: Negative.   Musculoskeletal: Negative.   Skin: Negative.        superficial cutting laceration to left forearm.  Endo/Heme/Allergies: Negative.   Psychiatric/Behavioral: Negative.     Blood pressure 133/84, pulse 104, temperature 99 F (37.2 C), temperature source Oral, resp. rate 16, height 5' 8.25" (1.734 m), weight 99.791 kg (220 lb), last menstrual period 10/28/2014, SpO2 99 %.Body mass index is 33.19 kg/(m^2).  General Appearance: Casual, Guarded and Well Groomed  Engineer, water::  Fair  Speech:  Normal Rate and low  Volume:  Decreased  Mood:  Depressed and Hopeless  Affect:  Appropriate, Congruent, Constricted and Flat  Thought Process:  Coherent and Logical  Orientation:  Full (Time, Place, and Person)  Thought Content:  Hallucinations: None  Suicidal Thoughts:  No Patient reports not at this time  Homicidal Thoughts:  No  Memory:  Recent;   Fair  Judgement:  Poor  Insight:  Lacking  Psychomotor Activity:  Normal  Concentration:  Fair  Recall:  Fair  Fund of Knowledge:Fair  Language: Fair  Akathisia:  No  Handed:  Right  AIMS (if indicated):     Assets:  Communication Skills Desire for Improvement Financial Resources/Insurance Social Support Vocational/Educational  ADL's:  Intact  Cognition: WNL  Sleep:  Number of Hours: 3.27  I certify that inpatient services furnished can reasonably be expected to improve the patient's condition.   Treatment Plan Summary: Daily contact with patient to assess and evaluate symptoms and progress in treatment and Medication management   1. Daily contact with patient to assess and evaluate symptoms and progress in treatment and Medication management Admit for crisis management and stabilization.  Medication management to reduce current symptoms to bale line and improve the patient's overall level of functioning, Patient started on Prozac 10 mg daily for depression  1. Treat health problems as indicated 2. Develop  treatment plan to decrease risk of relapse upon discharge and the need for readmission. 3. Psycho-social education regarding relapse prevention and self care. 4. Health care follow up as needed for medical problems   [place] 12/01/2014, 12:48 PM I agree with findings and treatment plan of this patient

## 2014-12-02 MED ORDER — FLUOXETINE HCL 20 MG PO CAPS
20.0000 mg | ORAL_CAPSULE | Freq: Every day | ORAL | Status: DC
  Administered 2014-12-03: 20 mg via ORAL
  Filled 2014-12-02 (×2): qty 1
  Filled 2014-12-02: qty 7

## 2014-12-02 NOTE — BHH Group Notes (Signed)
[place] LCSW Group Therapy 12/02/2014  1:15 pm  Type of Therapy: Group Therapy Participation Level: Active  Participation Quality: Attentive, Sharing and Supportive  Affect: Appropriate  Cognitive: Alert and Oriented  Insight: Developing/Improving and Engaged  Engagement in Therapy: Developing/Improving and Engaged  Modes of Intervention: Clarification, Confrontation, Discussion, Education, Exploration,  Limit-setting, Orientation, Problem-solving, Rapport Building, Art therapist, Socialization and Support  Summary of Progress/Problems: Pt identified obstacles faced currently and processed barriers involved in overcoming these obstacles. Pt identified steps necessary for overcoming these obstacles and explored motivation (internal and external) for facing these difficulties head on. Pt further identified one area of concern in their lives and chose a goal to focus on for today. Patient identified cutting herself as an obstacle and shared that she has been without cutting for 1 year before. CSW and group members provided patient with emotional support and encouragement.  Tilden Fossa, MSW, [place] Worker [facility] [phone]

## 2014-12-02 NOTE — Progress Notes (Signed)
Adult Psychoeducational Group Note  Date:  12/02/2014 Time:  10:06 PM  Group Topic/Focus:  Wrap-Up Group:   The focus of this group is to help patients review their daily goal of treatment and discuss progress on daily workbooks.  Participation Level:  Active  Participation Quality:  Appropriate and Attentive  Affect:  Appropriate  Cognitive:  Appropriate  Insight: Appropriate and Good  Engagement in Group:  Engaged  Modes of Intervention:  Education  Additional Comments:  Pt overall had a great day. Pt goal was to come up with 5 positive things about self and she was able to do 6.   Jerline Pain 12/02/2014, 10:06 PM

## 2014-12-02 NOTE — BHH Group Notes (Signed)
   [facility] LCSW Aftercare Discharge Planning Group Note  12/02/2014  8:45 AM   Participation Quality: Alert, Appropriate and Oriented  Mood/Affect: Appropriate  Depression Rating: 3-4  Anxiety Rating: 0  Thoughts of Suicide: Pt denies SI/HI  Will you contract for safety? Yes  Current AVH: Pt denies  Plan for Discharge/Comments: Pt attended discharge planning group and actively participated in group. CSW provided pt with today's workbook. Patient plans to return home to follow up with outpatient services.  Transportation Means: Pt reports access to transportation  Supports: No supports mentioned at this time  Tilden Fossa, MSW, [place] Social Worker [employer] [phone]
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
## 2014-12-02 NOTE — BHH Suicide Risk Assessment (Signed)
[place] INPATIENT:  Family/Significant Other Suicide Prevention Education  Suicide Prevention Education:  Education Completed; mother Juliane Poot [phone],  (name of family member/significant other) has been identified by the patient as the family member/significant other with whom the patient will be residing, and identified as the person(s) who will aid the patient in the event of a mental health crisis (suicidal ideations/suicide attempt).  With written consent from the patient, the family member/significant other has been provided the following suicide prevention education, prior to the and/or following the discharge of the patient.  The suicide prevention education provided includes the following:  Suicide risk factors  Suicide prevention and interventions  National Suicide Hotline telephone number  [facility] assessment telephone number  [facility] Emergency Assistance [place] and/or Residential Mobile Crisis Unit telephone number  Request made of family/significant other to:  Remove weapons (e.g., guns, rifles, knives), all items previously/currently identified as safety concern.    Remove drugs/medications (over-the-counter, prescriptions, illicit drugs), all items previously/currently identified as a safety concern.  The family member/significant other verbalizes understanding of the suicide prevention education information provided.  The family member/significant other agrees to remove the items of safety concern listed above.  Katrina Stewart, Katrina Stewart 12/02/2014, 3:35 PM
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
## 2014-12-02 NOTE — Plan of Care (Signed)
Problem: Ineffective individual coping Goal: STG: Patient will remain free from self harm Outcome: Progressing Pt safe on the unit at this time     

## 2014-12-02 NOTE — Progress Notes (Signed)
D: Pt denies SI/HI/AVH. Pt is pleasant and cooperative. Pt stated she was getting better , getting out and talking to her peers and going to groups.   A: Pt was offered support and encouragement. Pt was given scheduled medications. Pt was encourage to attend groups. Q 15 minute checks were done for safety.   R:Pt attends groups and interacts well with peers and staff. Pt is taking medication. Pt has no complaints at this time .Pt receptive to treatment and safety maintained on unit.

## 2014-12-02 NOTE — Progress Notes (Signed)
D: pt presents with flat affect and depressed mood. Pt rates depression 4/10. Pt denies suicidal thoughts today. Pt reported good sleep and appetite. Pt verbalized that she is ready to discharge home in order to return back to work sot that she can pay pills. Pt compliant with taking meds. No adverse reactions to meds verbalized by pt. Pt compliant with attending groups. A: Medications reviewed with pt. Medications administered as ordered per MD. Verbal support given. Pt encouraged to attend groups. 15 minute checks performed for safety.  R: Pt receptive to tx.

## 2014-12-03 DIAGNOSIS — F332 Major depressive disorder, recurrent severe without psychotic features: Secondary | ICD-10-CM | POA: Insufficient documentation

## 2014-12-03 LAB — TSH: TSH: 3.42 u[IU]/mL (ref 0.350–4.500)

## 2014-12-03 MED ORDER — FLUOXETINE HCL 20 MG PO CAPS: 20.0000 mg | ORAL_CAPSULE | Freq: Every day | ORAL | Status: DC

## 2014-12-03 MED ORDER — TRAZODONE HCL 100 MG PO TABS: 100.0000 mg | ORAL_TABLET | Freq: Every day | ORAL | Status: DC

## 2014-12-03 NOTE — BHH Group Notes (Signed)

## 2014-12-03 NOTE — [facility] Suicide Risk Assessment (Signed)
[facility] Discharge Suicide Risk Assessment   Demographic Factors:  19 year old single female, lives with roommate. Employed .   Total Time spent with patient: 30 minutes  Musculoskeletal: Strength & Muscle Tone: within normal limits Gait & Station: normal Patient leans: N/A  Psychiatric Specialty Exam: Physical Exam  ROS  Blood pressure 133/86, pulse 108, temperature 98.6 F (37 C), temperature source Oral, resp. rate 16, height 5' 8.25" (1.734 m), weight 220 lb (99.791 kg), last menstrual period 10/28/2014, SpO2 99 %.Body mass index is 33.19 kg/(m^2).  General Appearance: Well Groomed  Eye Contact::  Good  Speech:  Normal Rate409  Volume:  Normal  Mood:  improved, denies feeling depressed at this time  Affect:  Appropriate  Thought Process:  Linear  Orientation:  Full (Time, Place, and Person)  Thought Content:  denies hallucinations, no delusions, not internally preoccupied   Suicidal Thoughts:  No  Homicidal Thoughts:  No  Memory:  recent and remote grossly intact   Judgement:  Other:  improved   Insight:  Present  Psychomotor Activity:  Normal  Concentration:  Good  Recall:  Good  Fund of Knowledge:Good  Language: Good  Akathisia:  Negative  Handed:  Right  AIMS (if indicated):     Assets:  Communication Skills Desire for Improvement Resilience  Sleep:  Number of Hours: 6.5  Cognition: WNL  ADL's:  Intact   Have you used any form of tobacco in the last 30 days? (Cigarettes, Smokeless Tobacco, Cigars, and/or Pipes): No  Has this patient used any form of tobacco in the last 30 days? (Cigarettes, Smokeless Tobacco, Cigars, and/or Pipes) No  Mental Status Per Nursing Assessment::   On Admission:     Current Mental Status by Physician:  At this time patient is improved compared to her admission- her mood is better, and currently denies feeling significantly depressed, her affect is brighter, no thought disorder, no hallucinations, no delusions , no suicidal thoughts,  no violent or homicidal ideations, future oriented, planning to return home and return to work later this week  Loss Factors: Grief related to death of grandmother, whom she was very close to, miscarriage earlier this year .  Historical Factors:  this is her first psychiatric admission, no prior suicidal attempts   Risk Reduction Factors:   Sense of responsibility to family, Employed, Living with another person, especially a relative and Positive coping skills or problem solving skills  Continued Clinical Symptoms:   At this time patient improved compared to admission status, and currently minimizes any ongoing depression, no SI , no psychotic symptoms, future oriented   Cognitive Features That Contribute To Risk:  No gross cognitive deficits noted upon discharge. Is alert , attentive, and oriented x 3   Suicide Risk:   Mild   Principal Problem: Major depressive disorder, recurrent episode, severe ([name]) Discharge Diagnoses:  Patient Active Problem List   Diagnosis Date Noted  . Alcohol use disorder ([place]) [F10.99]   . Severe major depression, single episode, without psychotic features ([place]) [F32.2] 11/29/2014  . Major depressive disorder, recurrent episode, severe ([place]) [F33.2] 11/29/2014  . Mass of breast, left [N63] 09/22/2011  . Mass of breast, right [N63] 09/22/2011    Follow-up Information    Go to [facility].   Why:  All appointments.  Patient would like medical records sent to this medical provider in advance of her next appointment.   Contact information:   [address] [place]  [address] Telephone:  [phone]      Go to [facility].   Specialty:  Professional Counselor   Why:  Walk-in clinic Monday-Friday between 8am to 12pm for assessment for therapy and medication management services. Arrive early in order to be seen as soon as possible.   Contact information:   [facility] [place] [address] [phone]       Plan Of Care/Follow-up recommendations:  Activity:  as tolerated  Diet:  Regular Tests:  NA Other:  See below   Is patient on multiple antipsychotic therapies at discharge:  No   Has Patient had three or more failed trials of antipsychotic monotherapy by history:  No  Recommended Plan for Multiple Antipsychotic Therapies: NA  Patient is being discharged in good spirits. States that mother is picking her up later today. Follow up as above.   Katrina Stewart 12/03/2014, 12:52 PM

## 2014-12-03 NOTE — Progress Notes (Signed)
  [facility] Adult Case Management Discharge Plan :  Will you be returning to the same living situation after discharge:  Yes,  patient plans to return home At discharge, do you have transportation home?: Yes,  patient reports access to transportation Do you have the ability to pay for your medications: Yes,  patient will be provided with prescriptions at discharge  Release of information consent forms completed and in the chart;  Patient's signature needed at discharge.  Patient to Follow up at: Follow-up Information    Go to [facility].   Why:  All appointments.  Patient would like medical records sent to this medical provider in advance of her next appointment.   Contact information:   [place]  [address] Telephone:  [phone]      Go to [facility].   Specialty:  Professional Counselor   Why:  Walk-in clinic Monday-Friday between 8am to 3pm for assessment for therapy and medication management services. Arrive early in order to be seen as soon as possible.   Contact information:   [facility] [place] [address] [phone]       Patient denies SI/HI: Yes, patient denies  Safety Planning and Suicide Prevention discussed: Yes,  with patient and mother  Have you used any form of tobacco in the last 30 days? (Cigarettes, Smokeless Tobacco, Cigars, and/or Pipes): No  Has patient been referred to the Quitline?: N/A patient is not a smoker  Kourtland Coopman L 12/03/2014, 10:07 AM

## 2014-12-03 NOTE — Discharge Summary (Signed)
Physician Discharge Summary Note  Patient:  Katrina Stewart is an 19 y.o., female MRN:  [id number] DOB:  Jun 20, 1995 Patient phone:  [phone] (home)  Patient address:   [place] [address],  Total Time spent with patient: 30 minutes  Date of Admission:  11/29/2014 Date of Discharge: 12/03/2014  Reason for Admission: Katrina Stewart is an 19 y.o. female presenting to [facility] due to intoxication and self-injurious behaviors. Pt stated "I been drinking, cut my wrist and taking pills". Pt denies SI, HI and AVH at this time. Pt reported a history of self-injurious behaviors since the age of 32. Pt did not endorse many depressive symptoms but shared that she has a history of depression and has been off of her medication for over 1 year. Pt did not report any issues with her sleep or appetite. Pt did not report any psychiatric hospitalization but shared that she has received therapy in the past. Pt reported that she drinks alcohol monthly and did not report any illicit substance use. Pt did not report any physical, sexual or emotional abuse at this time. 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 Major depressive disorder, recurrent episode, severe ([place]) [F33.2] 11/29/2014    Priority: High  . Alcohol use disorder ([place]) [F10.99]     Priority: Medium  . Severe major depression, single episode, without psychotic features ([place]) [F32.2] 11/29/2014  . Mass of breast, left [N63] 09/22/2011  . Mass of breast, right [N63] 09/22/2011    Musculoskeletal: Strength & Muscle Tone: within normal limits Gait & Station: normal Patient leans: N/A  Psychiatric Specialty Exam: See SRA Physical Exam  Nursing note and vitals reviewed. Constitutional: She is oriented to  person, place, and time. She appears well-developed.  Neck: Normal range of motion.  Respiratory: Breath sounds normal.  Neurological: She is alert and oriented to person, place, and time.  Skin: Skin is warm and dry.  Psychiatric: She has a normal mood and affect. Her behavior is normal.    Review of Systems  Psychiatric/Behavioral: Negative for suicidal ideas. Depression: stable. Hallucinations: stable. Nervous/anxious: stable. Insomnia: stable.   All other systems reviewed and are negative.   Blood pressure 133/86, pulse 108, temperature 98.6 F (37 C), temperature source Oral, resp. rate 16, height 5' 8.25" (1.734 m), weight 99.791 kg (220 lb), last menstrual period 10/28/2014, SpO2 99 %.Body mass index is 33.19 kg/(m^2).  Have you used any form of tobacco in the last 30 days? (Cigarettes, Smokeless Tobacco, Cigars, and/or Pipes): No  Has this patient used any form of tobacco in the last 30 days? (Cigarettes, Smokeless Tobacco, Cigars, and/or Pipes) No  Past Medical History:  Past Medical History  Diagnosis Date  . Depression   . Anxiety     Past Surgical History  Procedure Laterality Date  . Mass excision  10/29/2011    Procedure: EXCISION MASS;  Surgeon: Harl Bowie, MD;  Location: [facility];  Service: General;  Laterality: Bilateral;  excision of bilateral breast masses  . Breast surgery     Family History:  Family History  Problem Relation Age of Onset  . Cancer Maternal Grandmother     lung cancer  . Cancer Other     bladder cancer   Social History:  History  Alcohol Use  . 1.2 -  1.8 oz/week  . 2-3 Shots of liquor per week     History  Drug Use No    Social History   Social History  . Marital Status: Single    Spouse Name: N/A  . Number of Children: N/A  . Years of Education: N/A   Social History Main Topics  . Smoking status: Never Smoker   . Smokeless tobacco: None  . Alcohol Use: 1.2 - 1.8 oz/week    2-3 Shots of liquor per week  . Drug Use: No   . Sexual Activity: Yes    Birth Control/ Protection: None   Other Topics Concern  . None   Social History Narrative    Risk to Self: Is patient at risk for suicide?: No What has been your use of drugs/alcohol within the last 12 months?: Alcohol about once every 2 weeks - to get drunk.   Risk to Others:  no  Prior Inpatient Therapy:  yes  Prior Outpatient Therapy:  yes  Level of Care:  OP  Hospital Course:  Katrina Stewart was admitted for Major depressive disorder, recurrent episode, severe ([place]) and crisis management. She was treated discharged with the medications listed below under Medication List.  Medical problems were identified and treated as needed.  Home medications were restarted as appropriate.  Improvement was monitored by observation and Katrina Stewart daily report of symptom reduction.  Emotional and mental status was monitored by daily self-inventory reports completed by Katrina Stewart and clinical staff.         Katrina Stewart was evaluated by the treatment team for stability and plans for continued recovery upon discharge.  Katrina Stewart motivation was an integral factor for scheduling further treatment.  Employment, transportation, bed availability, health status, family support, and any pending legal issues were also considered during her hospital stay.  She was offered further treatment options upon discharge including but not limited to Residential, Intensive Outpatient, and Outpatient treatment.  Katrina Stewart will follow up with the services as listed below under Follow Up Information.     Upon completion of this admission the patient was both mentally and medically stable for discharge denying suicidal/homicidal ideation, auditory/visual/tactile hallucinations, delusional thoughts and paranoia.      Consults:  psychiatry  Significant Diagnostic Studies:  labs: TSH; WNL, CMP; potassium 3.2 chloride112, Glucose 102, Etoh 149, CBC; Hemogluobin 11.8, MCV  74.0, MCH 23.9  Discharge Vitals:   Blood pressure 133/86, pulse 108, temperature 98.6 F (37 C), temperature source Oral, resp. rate 16, height 5' 8.25" (1.734 m), weight 99.791 kg (220 lb), last menstrual period 10/28/2014, SpO2 99 %. Body mass index is 33.19 kg/(m^2). Lab Results:   Results for orders placed or performed during the hospital encounter of 11/29/14 (from the past 72 hour(s))  TSH     Status: None   Collection Time: 12/03/14  6:20 AM  Result Value Ref Range   TSH 3.420 0.350 - 4.500 uIU/mL    Comment: Performed at [facility]    Physical Findings: AIMS: Facial and Oral Movements Muscles of Facial Expression: None, normal Lips and Perioral Area: None, normal Jaw: None, normal Tongue: None, normal,Extremity Movements Upper (arms, wrists, hands, fingers): None, normal Lower (legs, knees, ankles, toes): None, normal, Trunk Movements Neck, shoulders, hips: None, normal, Overall Severity Severity of abnormal movements (highest score from questions above): None, normal Incapacitation due to abnormal movements: None, normal Patient's awareness of abnormal movements (rate only patient's report): No Awareness, Dental Status Current problems with  teeth and/or dentures?: No Does patient usually wear dentures?: No  CIWA:    COWS:      See Psychiatric Specialty Exam and Suicide Risk Assessment completed by Attending Physician prior to discharge.  Discharge destination:  Home  Is patient on multiple antipsychotic therapies at discharge:  No   Has Patient had three or more failed trials of antipsychotic monotherapy by history:  No    Recommended Plan for Multiple Antipsychotic Therapies: NA     Medication List    ASK your doctor about these medications      Indication   ondansetron 4 MG disintegrating tablet  Commonly known as:  ZOFRAN ODT  Take 1 tablet (4 mg total) by mouth every 8 (eight) hours as needed for nausea.            Follow-up  Information    Go to [facility].   Why:  All appointments.  Patient would like medical records sent to this medical provider in advance of her next appointment.   Contact information:   [place]  [address] Telephone:  [phone]      Go to [facility].   Specialty:  Professional Counselor   Why:  Walk-in clinic Monday-Friday between 8am to 12pm for assessment for therapy and medication management services. Arrive early in order to be seen as soon as possible.   Contact information:   [facility] [place] [address] [phone]       Follow-up recommendations:  Activity:  as tolerated Diet:  heart healthy  Comments:  Take all of you medications as prescribed by your mental healthcare provider.  Report any adverse effects and reactions from your medications to your outpatient provider promptly. Do not engage in alcohol and or illegal drug use while on prescription medicines. In the event of worsening symptoms call the crisis hotline, 911, and or go to the nearest emergency department for appropriate evaluation and treatment of symptoms. Follow-up with your primary care provider for your medical issues, concerns and or health care needs.   Keep all scheduled appointments.  If you are unable to keep an appointment call to reschedule.  Let the nurse know if you will need medications before next scheduled appointment.  Total Discharge Time: 30 minutes   Signed: Derrill Center FNP-BC 12/03/2014, 10:40 AM   Patient seen, Suicide Assessment Completed.  Disposition Plan Reviewed

## 2015-03-01 ENCOUNTER — Encounter (HOSPITAL_COMMUNITY): Payer: Self-pay | Admitting: *Deleted

## 2015-03-01 ENCOUNTER — Emergency Department (HOSPITAL_COMMUNITY)
Admission: EM | Admit: 2015-03-01 | Discharge: 2015-03-01 | Disposition: A | Discharge status: Home / Self Care | Payer: Medicaid Other | Attending: Emergency Medicine | Admitting: Emergency Medicine

## 2015-03-01 DIAGNOSIS — F911 Conduct disorder, childhood-onset type: Secondary | ICD-10-CM | POA: Insufficient documentation

## 2015-03-01 DIAGNOSIS — F329 Major depressive disorder, single episode, unspecified: Secondary | ICD-10-CM | POA: Insufficient documentation

## 2015-03-01 DIAGNOSIS — Y9389 Activity, other specified: Secondary | ICD-10-CM | POA: Insufficient documentation

## 2015-03-01 DIAGNOSIS — X788XXA Intentional self-harm by other sharp object, initial encounter: Secondary | ICD-10-CM | POA: Insufficient documentation

## 2015-03-01 DIAGNOSIS — Y9289 Other specified places as the place of occurrence of the external cause: Secondary | ICD-10-CM | POA: Insufficient documentation

## 2015-03-01 DIAGNOSIS — Z79899 Other long term (current) drug therapy: Secondary | ICD-10-CM | POA: Insufficient documentation

## 2015-03-01 DIAGNOSIS — Y998 Other external cause status: Secondary | ICD-10-CM | POA: Insufficient documentation

## 2015-03-01 DIAGNOSIS — S61412A Laceration without foreign body of left hand, initial encounter: Secondary | ICD-10-CM | POA: Insufficient documentation

## 2015-03-01 DIAGNOSIS — Z7289 Other problems related to lifestyle: Secondary | ICD-10-CM

## 2015-03-01 DIAGNOSIS — F419 Anxiety disorder, unspecified: Secondary | ICD-10-CM | POA: Insufficient documentation

## 2015-03-01 NOTE — ED Notes (Addendum)
Pt to ED via [facility] for aggressive behavior.  Pt had called mom crying this evening.  Mom arrived home and called EMS.  Pt with superficial lacerations to L forearm and L thigh.  Denied SI.  Stated, "This is what I do."  Pt became combative with EMS and [employer].  PTA--Given Midazolam 5mg  IM in R glut without any changes.  Pt then given Haldol 5mg  IM L deltoid.  Pt arrives to ED naked.  Calm at this time.  Answers questions appropriately.

## 2015-03-01 NOTE — Discharge Instructions (Signed)
Follow-up with your therapist. °

## 2015-03-01 NOTE — ED Provider Notes (Addendum)
History  By signing my name below, I, [name], attest that this documentation has been prepared under the direction and in the presence of Delora Fuel, MD. Electronically Signed: [name], ED Scribe. 03/01/2015. 1:19 AM.  Chief Complaint  Patient presents with  . Aggressive Behavior   The history is provided by the patient and medical records. No language interpreter was used.    HPI Comments:  Katrina Stewart is a 20 y.o. female with PMHx of depression and anxiety, brought in by EMS, who presents to the Emergency Department complaining of cutting BUE PTA. She states she always does it "because it relieves pain". She reports difficulty sleeping. Pt reports drinking alcohol tonight. She has not done anything to treat her wounds. She denies modifying factors. She denies SI or HI. Her mother is at bedside and states this is normal for her daughter. Pt has been admitted to [facility] in the past. EMS gave Versed and Haldol en route secondary to pt becoming combative with them and [employer].  Past Medical History  Diagnosis Date  . Depression   . Anxiety    Past Surgical History  Procedure Laterality Date  . Mass excision  10/29/2011    Procedure: EXCISION MASS;  Surgeon: Harl Bowie, MD;  Location: [facility];  Service: General;  Laterality: Bilateral;  excision of bilateral breast masses  . Breast surgery     Family History  Problem Relation Age of Onset  . Cancer Maternal Grandmother     lung cancer  . Cancer Other     bladder cancer   Social History  Substance Use Topics  . Smoking status: Never Smoker   . Smokeless tobacco: None  . Alcohol Use: 1.2 - 1.8 oz/week    2-3 Shots of liquor per week   OB History    No data available     Review of Systems  Skin: Positive for wound.  All other systems reviewed and are negative.   Allergies  Review of patient's allergies indicates no known allergies.  Home Medications   Prior to Admission medications   Medication  Sig Start Date End Date Taking? Authorizing Provider  FLUoxetine (PROZAC) 20 MG capsule Take 1 capsule (20 mg total) by mouth daily. 12/03/14   Derrill Center, NP  traZODone (DESYREL) 100 MG tablet Take 1 tablet (100 mg total) by mouth at bedtime. 12/03/14   Derrill Center, NP   Triage Vitals: BP 107/58 mmHg  Pulse 102  Temp(Src) 98.4 F (36.9 C) (Oral)  Resp 16  Ht 5\' 8"  (1.727 m)  Wt 215 lb (97.523 kg)  BMI 32.70 kg/m2  SpO2 97%  LMP 01/29/2015 Physical Exam  Constitutional: She is oriented to person, place, and time. She appears well-developed and well-nourished. No distress.  Restrained in 4 point restraints.  HENT:  Head: Normocephalic and atraumatic.  Eyes: EOM are normal. Pupils are equal, round, and reactive to light.  Neck: Normal range of motion. Neck supple. No JVD present.  Cardiovascular: Normal rate.   Pulmonary/Chest: Effort normal. She has no wheezes. She has no rales. She exhibits no tenderness.  Abdominal: Soft. Bowel sounds are normal. She exhibits no distension and no mass. There is no tenderness.  Musculoskeletal: Normal range of motion. She exhibits no edema.  Multiple superficial lacerations on flexor surface of LUE.  Lymphadenopathy:    She has no cervical adenopathy.  Neurological: She is alert and oriented to person, place, and time. She displays normal reflexes. No cranial nerve deficit.  She exhibits normal muscle tone. Coordination normal.  Skin: Skin is warm and dry. No rash noted.  Nursing note and vitals reviewed.   ED Course  Procedures (including critical care time) DIAGNOSTIC STUDIES: Oxygen Saturation is 97% on RA, normal by my interpretation.   COORDINATION OF CARE: 1:06 AM- Will have [employer] unrestrain patient and will evaluate pt afterwards. Pt verbalizes understanding and agrees to plan.   MDM   Final diagnoses:  Self mutilating behavior    Self-care voiding behavior. Patient appear in acute became agitated when police arrived to bring  her to the hospital. Her mother is here and confirms that she frequent he does self mutilating behavior and does not notice anything unusual about her current condition. Patient is cooperative. Her restraints are removed and she remained cooperative. Since there appears to be no new process going on, she was felt to be safe for discharge. She is advised to follow-up with her therapist.  I personally performed the services described in this documentation, which was scribed in my presence. The recorded information has been reviewed and is accurate.      Delora Fuel, MD [id number]  Police were concerned about the patient's behavior. Apparently, she had also received haloperidol prior to arriving in the ED. She was observed in the ED for 4 hours and showed no evidence of aggressive behavior. Mother again reassures me that her self-mutilation has been an ongoing problem and she clearly shows signs of prior self-mutilation. She has outpatient resources and she will be discharged in the custody of her mother.  Delora Fuel, MD [id number] [phone]

## 2015-03-01 NOTE — ED Notes (Signed)
Pt was placed up for discharge.  [employer] concerned about safety for pt and others.  Called Dr. Roxanne Mins to come speak with [employer].  Per [employer], they will take out IVC paperwork if pt being discharged.  Dr. Roxanne Mins spoke with them and they again reiterated that they were concerned about pt's safety and the safety of others.  Made sure EDP was aware of medications pt received pta.  Per Dr. Roxanne Mins we will monitor pt 4 hours post Haldol administration.  Mother at bedside and is updated that pt will now be monitored until approx 4:30am. [employer] and BH sitter at bedside.

## 2015-04-04 ENCOUNTER — Encounter (HOSPITAL_COMMUNITY): Payer: Self-pay

## 2015-04-04 ENCOUNTER — Emergency Department (HOSPITAL_COMMUNITY)
Admission: EM | Admit: 2015-04-04 | Discharge: 2015-04-05 | Disposition: A | Discharge status: Home / Self Care | Payer: Medicaid Other | Attending: Emergency Medicine | Admitting: Emergency Medicine

## 2015-04-04 DIAGNOSIS — F329 Major depressive disorder, single episode, unspecified: Secondary | ICD-10-CM | POA: Insufficient documentation

## 2015-04-04 DIAGNOSIS — R509 Fever, unspecified: Secondary | ICD-10-CM | POA: Insufficient documentation

## 2015-04-04 DIAGNOSIS — R52 Pain, unspecified: Secondary | ICD-10-CM | POA: Insufficient documentation

## 2015-04-04 DIAGNOSIS — J3489 Other specified disorders of nose and nasal sinuses: Secondary | ICD-10-CM | POA: Insufficient documentation

## 2015-04-04 DIAGNOSIS — F419 Anxiety disorder, unspecified: Secondary | ICD-10-CM | POA: Insufficient documentation

## 2015-04-04 DIAGNOSIS — J029 Acute pharyngitis, unspecified: Secondary | ICD-10-CM | POA: Insufficient documentation

## 2015-04-04 DIAGNOSIS — R6889 Other general symptoms and signs: Secondary | ICD-10-CM

## 2015-04-04 DIAGNOSIS — R0982 Postnasal drip: Secondary | ICD-10-CM | POA: Insufficient documentation

## 2015-04-04 DIAGNOSIS — R61 Generalized hyperhidrosis: Secondary | ICD-10-CM | POA: Insufficient documentation

## 2015-04-04 DIAGNOSIS — R067 Sneezing: Secondary | ICD-10-CM | POA: Insufficient documentation

## 2015-04-04 DIAGNOSIS — Z79899 Other long term (current) drug therapy: Secondary | ICD-10-CM | POA: Insufficient documentation

## 2015-04-04 DIAGNOSIS — Z3202 Encounter for pregnancy test, result negative: Secondary | ICD-10-CM | POA: Insufficient documentation

## 2015-04-04 DIAGNOSIS — R05 Cough: Secondary | ICD-10-CM | POA: Insufficient documentation

## 2015-04-04 DIAGNOSIS — R112 Nausea with vomiting, unspecified: Secondary | ICD-10-CM | POA: Insufficient documentation

## 2015-04-04 LAB — COMPREHENSIVE METABOLIC PANEL
ALBUMIN: 4 g/dL (ref 3.5–5.0)
ALK PHOS: 73 U/L (ref 38–126)
ALT: 53 U/L (ref 14–54)
ANION GAP: 9 (ref 5–15)
AST: 33 U/L (ref 15–41)
BILIRUBIN TOTAL: 0.3 mg/dL (ref 0.3–1.2)
BUN: 10 mg/dL (ref 6–20)
CALCIUM: 9 mg/dL (ref 8.9–10.3)
CO2: 24 mmol/L (ref 22–32)
Chloride: 106 mmol/L (ref 101–111)
Creatinine, Ser: 0.9 mg/dL (ref 0.44–1.00)
GFR calc Af Amer: >60 mL/min (ref >60)
GFR calc non Af Amer: >60 mL/min (ref >60)
GLUCOSE: 129 mg/dL — AB (ref 65–99)
POTASSIUM: 3.4 mmol/L — AB (ref 3.5–5.1)
SODIUM: 139 mmol/L (ref 135–145)
TOTAL PROTEIN: 7.4 g/dL (ref 6.5–8.1)

## 2015-04-04 LAB — URINALYSIS, ROUTINE W REFLEX MICROSCOPIC
BILIRUBIN URINE: NEGATIVE
Glucose, UA: NEGATIVE mg/dL
KETONES UR: NEGATIVE mg/dL
Leukocytes, UA: NEGATIVE
NITRITE: NEGATIVE
PH: 5.5 (ref 5.0–8.0)
Protein, ur: NEGATIVE mg/dL
SPECIFIC GRAVITY, URINE: 1.042 — AB (ref 1.005–1.030)

## 2015-04-04 LAB — CBC
HEMATOCRIT: 39.4 % (ref 36.0–46.0)
HEMOGLOBIN: 12.4 g/dL (ref 12.0–15.0)
MCH: 23.8 pg — AB (ref 26.0–34.0)
MCHC: 31.5 g/dL (ref 30.0–36.0)
MCV: 75.8 fL — ABNORMAL LOW (ref 78.0–100.0)
Platelets: 341 10*3/uL (ref 150–400)
RBC: 5.2 MIL/uL — ABNORMAL HIGH (ref 3.87–5.11)
RDW: 14.3 % (ref 11.5–15.5)
WBC: 6.3 10*3/uL (ref 4.0–10.5)

## 2015-04-04 LAB — URINE MICROSCOPIC-ADD ON

## 2015-04-04 LAB — LIPASE, BLOOD: Lipase: 40 U/L (ref 11–51)

## 2015-04-04 LAB — POC URINE PREG, ED: Preg Test, Ur: NEGATIVE

## 2015-04-04 MED ORDER — ONDANSETRON HCL 4 MG/2ML IJ SOLN
4.0000 mg | Freq: Once | INTRAMUSCULAR | Status: AC
  Administered 2015-04-04: 4 mg via INTRAVENOUS
  Filled 2015-04-04: qty 2

## 2015-04-04 MED ORDER — HYDROCODONE-HOMATROPINE 5-1.5 MG/5ML PO SYRP: 5.0000 mL | ORAL_SOLUTION | Freq: Four times a day (QID) | ORAL | Status: DC | PRN

## 2015-04-04 MED ORDER — SODIUM CHLORIDE 0.9 % IV BOLUS (SEPSIS)
1000.0000 mL | Freq: Once | INTRAVENOUS | Status: AC
  Administered 2015-04-04: 1000 mL via INTRAVENOUS

## 2015-04-04 MED ORDER — ONDANSETRON HCL 4 MG PO TABS: 4.0000 mg | ORAL_TABLET | Freq: Four times a day (QID) | ORAL | Status: DC

## 2015-04-04 MED ORDER — HYDROCODONE-HOMATROPINE 5-1.5 MG/5ML PO SYRP
5.0000 mL | ORAL_SOLUTION | Freq: Once | ORAL | Status: AC
  Administered 2015-04-04: 5 mL via ORAL
  Filled 2015-04-04: qty 5

## 2015-04-04 NOTE — ED Notes (Signed)
Pt presents with c/o vomiting, generalized body aches, and coughing. Pt reports that her symptoms started 2 days ago.

## 2015-04-04 NOTE — ED Provider Notes (Signed)
CSN: [id number]     Arrival date & time 04/04/15  1741 History   First MD Initiated Contact with Patient 04/04/15 2018     Chief Complaint  Patient presents with  . Emesis  . Generalized Body Aches     (Consider location/radiation/quality/duration/timing/severity/associated sxs/prior Treatment) HPI Comments: Patient presents to the emergency department with chief complaint of flulike symptoms. She reports generalized body aches, fevers, chills, sweats, coughing, nausea, and vomiting. She states that her symptoms started 2 days ago. She has tried taking Mucinex with no relief. There are no modifying factors. She states that she became more concerned when she cannot keep anything down today. She denies any focal abdominal pain. Denies any dysuria, hematuria or vaginal discharge.  The history is provided by the patient. No language interpreter was used.    Past Medical History  Diagnosis Date  . Depression   . Anxiety    Past Surgical History  Procedure Laterality Date  . Mass excision  10/29/2011    Procedure: EXCISION MASS;  Surgeon: Harl Bowie, MD;  Location: [facility];  Service: General;  Laterality: Bilateral;  excision of bilateral breast masses  . Breast surgery     Family History  Problem Relation Age of Onset  . Cancer Maternal Grandmother     lung cancer  . Cancer Other     bladder cancer   Social History  Substance Use Topics  . Smoking status: Never Smoker   . Smokeless tobacco: None  . Alcohol Use: 1.2 - 1.8 oz/week    2-3 Shots of liquor per week   OB History    No data available     Review of Systems  Constitutional: Positive for fever and chills.  HENT: Positive for postnasal drip, rhinorrhea, sinus pressure, sneezing and sore throat.   Respiratory: Positive for cough. Negative for shortness of breath.   Cardiovascular: Negative for chest pain.  Gastrointestinal: Negative for nausea, vomiting, abdominal pain, diarrhea and constipation.   Genitourinary: Negative for dysuria.  All other systems reviewed and are negative.     Allergies  Review of patient's allergies indicates no known allergies.  Home Medications   Prior to Admission medications   Medication Sig Start Date End Date Taking? Authorizing Provider  acetaminophen (TYLENOL) 500 MG tablet Take 1,500 mg by mouth every 6 (six) hours as needed for moderate pain.   Yes Historical Provider, MD  FLUoxetine (PROZAC) 20 MG capsule Take 1 capsule (20 mg total) by mouth daily. 12/03/14  Yes Derrill Center, NP  traZODone (DESYREL) 100 MG tablet Take 1 tablet (100 mg total) by mouth at bedtime. 12/03/14  Yes Derrill Center, NP   BP 155/88 mmHg  Pulse 89  Temp(Src) 98.3 F (36.8 C) (Oral)  Resp 18  SpO2 97%  LMP 03/28/2015 (Approximate) Physical Exam  Constitutional: She appears well-developed and well-nourished. No distress.  HENT:  Head: Normocephalic.  Right Ear: External ear normal.  Left Ear: External ear normal.  Mildly erythematous, no tonsillar exudate, no abscess, no stridor, uvula is midline  TMs clear bilaterally  Eyes: Conjunctivae and EOM are normal. Pupils are equal, round, and reactive to light.  Neck: Normal range of motion. Neck supple.  Cardiovascular: Normal rate, regular rhythm and normal heart sounds.  Exam reveals no gallop and no friction rub.   No murmur heard. Pulmonary/Chest: Effort normal and breath sounds normal. No stridor. No respiratory distress. She has no wheezes. She has no rales. She exhibits no tenderness.  CTAB  Abdominal: Soft. Bowel sounds are normal. She exhibits no distension. There is no tenderness.  Musculoskeletal: Normal range of motion. She exhibits no tenderness.  Neurological: She is alert.  Skin: Skin is warm and dry. No rash noted. She is not diaphoretic.  Psychiatric: She has a normal mood and affect. Her behavior is normal. Judgment and thought content normal.  Nursing note and vitals reviewed.   ED Course   Procedures (including critical care time)  Results for orders placed or performed during the hospital encounter of 04/04/15  Lipase, blood  Result Value Ref Range   Lipase 40 11 - 51 U/L  Comprehensive metabolic panel  Result Value Ref Range   Sodium 139 135 - 145 mmol/L   Potassium 3.4 (L) 3.5 - 5.1 mmol/L   Chloride 106 101 - 111 mmol/L   CO2 24 22 - 32 mmol/L   Glucose, Bld 129 (H) 65 - 99 mg/dL   BUN 10 6 - 20 mg/dL   Creatinine, Ser 0.90 0.44 - 1.00 mg/dL   Calcium 9.0 8.9 - 10.3 mg/dL   Total Protein 7.4 6.5 - 8.1 g/dL   Albumin 4.0 3.5 - 5.0 g/dL   AST 33 15 - 41 U/L   ALT 53 14 - 54 U/L   Alkaline Phosphatase 73 38 - 126 U/L   Total Bilirubin 0.3 0.3 - 1.2 mg/dL   GFR calc non Af Amer >60 >60 mL/min   GFR calc Af Amer >60 >60 mL/min   Anion gap 9 5 - 15  CBC  Result Value Ref Range   WBC 6.3 4.0 - 10.5 K/uL   RBC 5.20 (H) 3.87 - 5.11 MIL/uL   Hemoglobin 12.4 12.0 - 15.0 g/dL   HCT 39.4 36.0 - 46.0 %   MCV 75.8 (L) 78.0 - 100.0 fL   MCH 23.8 (L) 26.0 - 34.0 pg   MCHC 31.5 30.0 - 36.0 g/dL   RDW 14.3 11.5 - 15.5 %   Platelets 341 150 - 400 K/uL  Urinalysis, Routine w reflex microscopic (not at [facility])  Result Value Ref Range   Color, Urine YELLOW YELLOW   APPearance CLOUDY (A) CLEAR   Specific Gravity, Urine 1.042 (H) 1.005 - 1.030   pH 5.5 5.0 - 8.0   Glucose, UA NEGATIVE NEGATIVE mg/dL   Hgb urine dipstick MODERATE (A) NEGATIVE   Bilirubin Urine NEGATIVE NEGATIVE   Ketones, ur NEGATIVE NEGATIVE mg/dL   Protein, ur NEGATIVE NEGATIVE mg/dL   Nitrite NEGATIVE NEGATIVE   Leukocytes, UA NEGATIVE NEGATIVE  Urine microscopic-add on  Result Value Ref Range   Squamous Epithelial / LPF TOO NUMEROUS TO COUNT (A) NONE SEEN   WBC, UA 0-5 0 - 5 WBC/hpf   RBC / HPF 6-30 0 - 5 RBC/hpf   Bacteria, UA MANY (A) NONE SEEN   Crystals CA OXALATE CRYSTALS (A) NEGATIVE  POC urine preg, ED (not at [facility])  Result Value Ref Range   Preg Test, Ur NEGATIVE NEGATIVE   No results  found.   I have personally reviewed and evaluated these images and lab results as part of my medical decision-making.    MDM   Final diagnoses:  Flu-like symptoms    Patient with flulike symptoms. She has had some persistent vomiting. Will treat with fluids and Zofran. Will fluid challenge. Anticipate discharge to home.  11:45 PM  Patient states that she is feeling better.  Tolerating orals.  DC to home.   Montine Circle, PA-C 04/04/15 [place]  Darl Householder, MD 04/07/15 [id number]

## 2015-04-04 NOTE — ED Notes (Signed)
Pt vomited.   

## 2015-04-04 NOTE — Discharge Instructions (Signed)

## 2015-04-06 LAB — URINE CULTURE

## 2015-04-07 ENCOUNTER — Emergency Department (HOSPITAL_COMMUNITY)
Admission: EM | Admit: 2015-04-07 | Discharge: 2015-04-07 | Disposition: A | Discharge status: Home / Self Care | Payer: Medicaid Other | Attending: Emergency Medicine | Admitting: Emergency Medicine

## 2015-04-07 ENCOUNTER — Encounter (HOSPITAL_COMMUNITY): Payer: Self-pay

## 2015-04-07 DIAGNOSIS — M546 Pain in thoracic spine: Secondary | ICD-10-CM | POA: Insufficient documentation

## 2015-04-07 DIAGNOSIS — Z3202 Encounter for pregnancy test, result negative: Secondary | ICD-10-CM | POA: Insufficient documentation

## 2015-04-07 DIAGNOSIS — M545 Low back pain: Secondary | ICD-10-CM | POA: Insufficient documentation

## 2015-04-07 DIAGNOSIS — M549 Dorsalgia, unspecified: Secondary | ICD-10-CM

## 2015-04-07 DIAGNOSIS — Z79899 Other long term (current) drug therapy: Secondary | ICD-10-CM | POA: Insufficient documentation

## 2015-04-07 DIAGNOSIS — F329 Major depressive disorder, single episode, unspecified: Secondary | ICD-10-CM | POA: Insufficient documentation

## 2015-04-07 DIAGNOSIS — R1084 Generalized abdominal pain: Secondary | ICD-10-CM

## 2015-04-07 DIAGNOSIS — F419 Anxiety disorder, unspecified: Secondary | ICD-10-CM | POA: Insufficient documentation

## 2015-04-07 LAB — CBC
HCT: 39.8 % (ref 36.0–46.0)
HEMOGLOBIN: 12.8 g/dL (ref 12.0–15.0)
MCH: 24.3 pg — ABNORMAL LOW (ref 26.0–34.0)
MCHC: 32.2 g/dL (ref 30.0–36.0)
MCV: 75.5 fL — ABNORMAL LOW (ref 78.0–100.0)
Platelets: 351 10*3/uL (ref 150–400)
RBC: 5.27 MIL/uL — AB (ref 3.87–5.11)
RDW: 14.3 % (ref 11.5–15.5)
WBC: 3.4 10*3/uL — ABNORMAL LOW (ref 4.0–10.5)

## 2015-04-07 LAB — URINALYSIS, ROUTINE W REFLEX MICROSCOPIC
Bilirubin Urine: NEGATIVE
Glucose, UA: NEGATIVE mg/dL
Ketones, ur: NEGATIVE mg/dL
Leukocytes, UA: NEGATIVE
NITRITE: NEGATIVE
Protein, ur: NEGATIVE mg/dL
SPECIFIC GRAVITY, URINE: 1.022 (ref 1.005–1.030)
pH: 7 (ref 5.0–8.0)

## 2015-04-07 LAB — URINE MICROSCOPIC-ADD ON

## 2015-04-07 LAB — LIPASE, BLOOD: Lipase: 48 U/L (ref 11–51)

## 2015-04-07 LAB — COMPREHENSIVE METABOLIC PANEL
ALK PHOS: 69 U/L (ref 38–126)
ALT: 31 U/L (ref 14–54)
ANION GAP: 6 (ref 5–15)
AST: 20 U/L (ref 15–41)
Albumin: 4.3 g/dL (ref 3.5–5.0)
BUN: 10 mg/dL (ref 6–20)
CALCIUM: 9.4 mg/dL (ref 8.9–10.3)
CO2: 24 mmol/L (ref 22–32)
Chloride: 105 mmol/L (ref 101–111)
Creatinine, Ser: 0.76 mg/dL (ref 0.44–1.00)
GFR calc non Af Amer: >60 mL/min (ref >60)
Glucose, Bld: 90 mg/dL (ref 65–99)
Potassium: 3.8 mmol/L (ref 3.5–5.1)
SODIUM: 135 mmol/L (ref 135–145)
TOTAL PROTEIN: 8 g/dL (ref 6.5–8.1)
Total Bilirubin: 0.4 mg/dL (ref 0.3–1.2)

## 2015-04-07 LAB — PREGNANCY, URINE: PREG TEST UR: NEGATIVE

## 2015-04-07 MED ORDER — KETOROLAC TROMETHAMINE 30 MG/ML IJ SOLN
30.0000 mg | Freq: Once | INTRAMUSCULAR | Status: AC
  Administered 2015-04-07: 30 mg via INTRAVENOUS
  Filled 2015-04-07: qty 1

## 2015-04-07 MED ORDER — SODIUM CHLORIDE 0.9 % IV BOLUS (SEPSIS)
1000.0000 mL | Freq: Once | INTRAVENOUS | Status: AC
  Administered 2015-04-07: 1000 mL via INTRAVENOUS

## 2015-04-07 NOTE — ED Provider Notes (Signed)
CSN: [id number]     Arrival date & time 04/07/15  0932 History   First MD Initiated Contact with Patient 04/07/15 1053     Chief Complaint  Patient presents with  . Abdominal Pain  . Back Pain     (Consider location/radiation/quality/duration/timing/severity/associated sxs/prior Treatment) HPI Comments: 20 year old female who presents with abdominal pain and back pain. Patient was seen here 3 days ago for viral symptoms including body aches, fevers, cough/cold symptoms, vomiting and diarrhea. She was diagnosed with a viral illness and discharged home. She reports that all of these symptoms have improved but yesterday she began having abdominal pain which she initially reported was in her left lower quadrant but later states that it has been all over. She also reports some low back pain that sometimes radiates up and down her entire back. She denies any urinary symptoms. No vaginal bleeding or discharge. No fevers. She has been taking Tylenol without relief. Nothing makes the abdominal pain better or worse. She endorses normal appetite.  Patient is a 20 y.o. female presenting with abdominal pain and back pain. The history is provided by the patient.  Abdominal Pain Back Pain Associated symptoms: abdominal pain     Past Medical History  Diagnosis Date  . Depression   . Anxiety    Past Surgical History  Procedure Laterality Date  . Mass excision  10/29/2011    Procedure: EXCISION MASS;  Surgeon: Harl Bowie, MD;  Location: [facility];  Service: General;  Laterality: Bilateral;  excision of bilateral breast masses  . Breast surgery     Family History  Problem Relation Age of Onset  . Cancer Maternal Grandmother     lung cancer  . Cancer Other     bladder cancer   Social History  Substance Use Topics  . Smoking status: Never Smoker   . Smokeless tobacco: None  . Alcohol Use: 1.2 - 1.8 oz/week    2-3 Shots of liquor per week   OB History    No data available     Review of  Systems  Gastrointestinal: Positive for abdominal pain.  Musculoskeletal: Positive for back pain.   10 Systems reviewed and are negative for acute change except as noted in the HPI.    Allergies  Review of patient's allergies indicates no known allergies.  Home Medications   Prior to Admission medications   Medication Sig Start Date End Date Taking? Authorizing Provider  acetaminophen (TYLENOL) 500 MG tablet Take 1,500 mg by mouth every 6 (six) hours as needed for moderate pain.   Yes Historical Provider, MD  FLUoxetine (PROZAC) 40 MG capsule Take 40 mg by mouth daily.   Yes Historical Provider, MD  HYDROcodone-homatropine (HYCODAN) 5-1.5 MG/5ML syrup Take 5 mLs by mouth every 6 (six) hours as needed for cough. 04/04/15  Yes Montine Circle, PA-C  ondansetron (ZOFRAN) 4 MG tablet Take 1 tablet (4 mg total) by mouth every 6 (six) hours. 04/04/15  Yes Montine Circle, PA-C  traZODone (DESYREL) 100 MG tablet Take 1 tablet (100 mg total) by mouth at bedtime. 12/03/14  Yes Derrill Center, NP  FLUoxetine (PROZAC) 20 MG capsule Take 1 capsule (20 mg total) by mouth daily. Patient not taking: Reported on 04/07/2015 12/03/14   Derrill Center, NP   BP 119/79 mmHg  Pulse 78  Temp(Src) 97.9 F (36.6 C) (Temporal)  Resp 18  SpO2 100%  LMP 03/28/2015 (Approximate) Physical Exam  Constitutional: She is oriented to person, place, and time. She  appears well-developed and well-nourished. No distress.  uncomfortable  HENT:  Head: Normocephalic and atraumatic.  Moist mucous membranes  Eyes: Conjunctivae are normal. Pupils are equal, round, and reactive to light.  Neck: Neck supple.  Cardiovascular: Normal rate, regular rhythm and normal heart sounds.   No murmur heard. Pulmonary/Chest: Effort normal and breath sounds normal.  Abdominal: Soft. Bowel sounds are normal. She exhibits no distension. There is no rebound and no guarding.  Generalized TTP, no focal pain  Musculoskeletal: She exhibits no  edema.  Tenderness to light touch of upper and lower back  Neurological: She is alert and oriented to person, place, and time.  Fluent speech  Skin: Skin is warm and dry.  Psychiatric: She has a normal mood and affect. Judgment normal.  Nursing note and vitals reviewed.   ED Course  Procedures (including critical care time) Labs Review Labs Reviewed  CBC - Abnormal; Notable for the following:    WBC 3.4 (*)    RBC 5.27 (*)    MCV 75.5 (*)    MCH 24.3 (*)    All other components within normal limits  URINALYSIS, ROUTINE W REFLEX MICROSCOPIC (NOT AT [facility]) - Abnormal; Notable for the following:    APPearance CLOUDY (*)    Hgb urine dipstick TRACE (*)    All other components within normal limits  URINE MICROSCOPIC-ADD ON - Abnormal; Notable for the following:    Squamous Epithelial / LPF 6-30 (*)    Bacteria, UA MANY (*)    All other components within normal limits  LIPASE, BLOOD  COMPREHENSIVE METABOLIC PANEL  PREGNANCY, URINE    Imaging Review No results found. I have personally reviewed and evaluated these lab results as part of my medical decision-making. Medications  sodium chloride 0.9 % bolus 1,000 mL (0 mLs Intravenous Stopped 04/07/15 1256)  ketorolac (TORADOL) 30 MG/ML injection 30 mg (30 mg Intravenous Given 04/07/15 1253)     MDM   Final diagnoses:  Generalized abdominal pain  Bilateral back pain, unspecified location   PT w/ abdominal and back pain since yesterday in setting of recent viral illness. On exam she was non-toxic and in NAD. Vital signs unremarkable. Generalized tenderness without peritonitis or focal pain. Obtained above labs and gave the patient an IV fluid bolus as well as Toradol.  Labs unremarkable and show no evidence of pyelonephritis. WBC normal. On reexamination, the patient was comfortable and drinking Sprite. I suspect her symptoms may be related to her recent viral illness. I discussed supportive care as well as return precautions and  patient voice understanding. Patient discharged in satisfactory condition.    Sharlett Iles, MD 04/07/15 [phone]

## 2015-04-07 NOTE — ED Notes (Signed)
Patient provided with Sprite to drink.

## 2015-04-07 NOTE — ED Notes (Signed)
Pt c/o increasing LLQ abdominal pain and L lower back pain x 6 days.  Pain score 8/10.  Pt report taking tylenol w/o relief.  Pt reports that she was seen at [facility] x 3 days ago for the flu and was directed to return if abdominal pain increased.  Denies n/v/d.

## 2015-06-16 ENCOUNTER — Emergency Department (HOSPITAL_COMMUNITY)
Admission: EM | Admit: 2015-06-16 | Discharge: 2015-06-16 | Disposition: A | Discharge status: Home / Self Care | Payer: Medicaid Other | Attending: Emergency Medicine | Admitting: Emergency Medicine

## 2015-06-16 ENCOUNTER — Encounter (HOSPITAL_COMMUNITY): Payer: Self-pay | Admitting: Emergency Medicine

## 2015-06-16 ENCOUNTER — Emergency Department (HOSPITAL_COMMUNITY): Payer: Medicaid Other

## 2015-06-16 ENCOUNTER — Telehealth: Payer: Self-pay | Admitting: *Deleted

## 2015-06-16 DIAGNOSIS — O99340 Other mental disorders complicating pregnancy, unspecified trimester: Secondary | ICD-10-CM | POA: Insufficient documentation

## 2015-06-16 DIAGNOSIS — Z3A00 Weeks of gestation of pregnancy not specified: Secondary | ICD-10-CM | POA: Diagnosis not present

## 2015-06-16 DIAGNOSIS — M545 Low back pain: Secondary | ICD-10-CM | POA: Diagnosis not present

## 2015-06-16 DIAGNOSIS — N898 Other specified noninflammatory disorders of vagina: Secondary | ICD-10-CM | POA: Insufficient documentation

## 2015-06-16 DIAGNOSIS — F329 Major depressive disorder, single episode, unspecified: Secondary | ICD-10-CM | POA: Insufficient documentation

## 2015-06-16 DIAGNOSIS — F419 Anxiety disorder, unspecified: Secondary | ICD-10-CM | POA: Diagnosis not present

## 2015-06-16 DIAGNOSIS — O9989 Other specified diseases and conditions complicating pregnancy, childbirth and the puerperium: Secondary | ICD-10-CM | POA: Insufficient documentation

## 2015-06-16 DIAGNOSIS — Z79899 Other long term (current) drug therapy: Secondary | ICD-10-CM | POA: Diagnosis not present

## 2015-06-16 DIAGNOSIS — O219 Vomiting of pregnancy, unspecified: Secondary | ICD-10-CM | POA: Diagnosis not present

## 2015-06-16 DIAGNOSIS — R102 Pelvic and perineal pain: Secondary | ICD-10-CM | POA: Insufficient documentation

## 2015-06-16 DIAGNOSIS — R1030 Lower abdominal pain, unspecified: Secondary | ICD-10-CM | POA: Insufficient documentation

## 2015-06-16 DIAGNOSIS — R000 Tachycardia, unspecified: Secondary | ICD-10-CM | POA: Diagnosis not present

## 2015-06-16 DIAGNOSIS — N9489 Other specified conditions associated with female genital organs and menstrual cycle: Secondary | ICD-10-CM | POA: Diagnosis not present

## 2015-06-16 DIAGNOSIS — O26899 Other specified pregnancy related conditions, unspecified trimester: Secondary | ICD-10-CM

## 2015-06-16 LAB — CBC WITH DIFFERENTIAL/PLATELET
BASOS ABS: 0.1 10*3/uL (ref 0.0–0.1)
Basophils Relative: 1 %
EOS ABS: 0.2 10*3/uL (ref 0.0–0.7)
Eosinophils Relative: 3 %
HCT: 34.8 % — ABNORMAL LOW (ref 36.0–46.0)
HEMOGLOBIN: 11.2 g/dL — AB (ref 12.0–15.0)
Lymphocytes Relative: 38 %
Lymphs Abs: 2.3 10*3/uL (ref 0.7–4.0)
MCH: 23.4 pg — AB (ref 26.0–34.0)
MCHC: 32.2 g/dL (ref 30.0–36.0)
MCV: 72.7 fL — ABNORMAL LOW (ref 78.0–100.0)
MONO ABS: 0.5 10*3/uL (ref 0.1–1.0)
Monocytes Relative: 8 %
NEUTROS PCT: 50 %
Neutro Abs: 2.9 10*3/uL (ref 1.7–7.7)
PLATELETS: 289 10*3/uL (ref 150–400)
RBC: 4.79 MIL/uL (ref 3.87–5.11)
RDW: 13.9 % (ref 11.5–15.5)
WBC: 6 10*3/uL (ref 4.0–10.5)

## 2015-06-16 LAB — URINALYSIS, ROUTINE W REFLEX MICROSCOPIC
BILIRUBIN URINE: NEGATIVE
Glucose, UA: NEGATIVE mg/dL
KETONES UR: NEGATIVE mg/dL
LEUKOCYTES UA: NEGATIVE
NITRITE: NEGATIVE
PROTEIN: NEGATIVE mg/dL
Specific Gravity, Urine: 1.005 (ref 1.005–1.030)
pH: 6 (ref 5.0–8.0)

## 2015-06-16 LAB — BASIC METABOLIC PANEL
ANION GAP: 10 (ref 5–15)
BUN: 6 mg/dL (ref 6–20)
CALCIUM: 9.6 mg/dL (ref 8.9–10.3)
CO2: 22 mmol/L (ref 22–32)
CREATININE: 0.83 mg/dL (ref 0.44–1.00)
Chloride: 106 mmol/L (ref 101–111)
Glucose, Bld: 90 mg/dL (ref 65–99)
Potassium: 3.2 mmol/L — ABNORMAL LOW (ref 3.5–5.1)
SODIUM: 138 mmol/L (ref 135–145)

## 2015-06-16 LAB — URINE MICROSCOPIC-ADD ON: Bacteria, UA: NONE SEEN

## 2015-06-16 LAB — I-STAT BETA HCG BLOOD, ED (MC, WL, AP ONLY): HCG, QUANTITATIVE: 329.2 m[IU]/mL — AB (ref <5)

## 2015-06-16 LAB — HCG, QUANTITATIVE, PREGNANCY: hCG, Beta Chain, Quant, S: 389 m[IU]/mL — ABNORMAL HIGH (ref <5)

## 2015-06-16 MED ORDER — SODIUM CHLORIDE 0.9 % IV BOLUS (SEPSIS)
1000.0000 mL | Freq: Once | INTRAVENOUS | Status: AC
  Administered 2015-06-16: 1000 mL via INTRAVENOUS

## 2015-06-16 NOTE — Discharge Instructions (Signed)
Read the information below.  You may return to the Emergency Department at any time for worsening condition or any new symptoms that concern you.  If you develop high fevers, worsening abdominal pain, uncontrolled vomiting, or are unable to tolerate fluids by mouth, return to the ER for a recheck.     Abdominal Pain During Pregnancy Abdominal pain is common in pregnancy. Most of the time, it does not cause harm. There are many causes of abdominal pain. Some causes are more serious than others. Some of the causes of abdominal pain in pregnancy are easily diagnosed. Occasionally, the diagnosis takes time to understand. Other times, the cause is not determined. Abdominal pain can be a sign that something is very wrong with the pregnancy, or the pain may have nothing to do with the pregnancy at all. For this reason, always tell your health care provider if you have any abdominal discomfort. HOME CARE INSTRUCTIONS  Monitor your abdominal pain for any changes. The following actions may help to alleviate any discomfort you are experiencing:  Do not have sexual intercourse or put anything in your vagina until your symptoms go away completely.  Get plenty of rest until your pain improves.  Drink clear fluids if you feel nauseous. Avoid solid food as long as you are uncomfortable or nauseous.  Only take over-the-counter or prescription medicine as directed by your health care provider.  Keep all follow-up appointments with your health care provider. SEEK IMMEDIATE MEDICAL CARE IF:  You are bleeding, leaking fluid, or passing tissue from the vagina.  You have increasing pain or cramping.  You have persistent vomiting.  You have painful or bloody urination.  You have a fever.  You notice a decrease in your baby's movements.  You have extreme weakness or feel faint.  You have shortness of breath, with or without abdominal pain.  You develop a severe headache with abdominal pain.  You have  abnormal vaginal discharge with abdominal pain.  You have persistent diarrhea.  You have abdominal pain that continues even after rest, or gets worse. MAKE SURE YOU:   Understand these instructions.  Will watch your condition.  Will get help right away if you are not doing well or get worse.   This information is not intended to replace advice given to you by your health care provider. Make sure you discuss any questions you have with your health care provider.   Document Released: 01/18/2005 Document Revised: 11/08/2012 Document Reviewed: 08/17/2012 Elsevier Interactive Patient Education [employer].

## 2015-06-16 NOTE — ED Notes (Signed)
Pt ambulated to room from waiting room. 

## 2015-06-16 NOTE — ED Notes (Signed)
Pt sts lower abd pain and back pain; pt denies discharge or dysuria; pt sts LMP was 05/14/15

## 2015-06-16 NOTE — ED Notes (Signed)
Lab called and stated they just received the wet prep that was collected art 11:29. Lab states that the wet prep is only good for 45 mins. The tech sent the culture down immediately after the PA collected. The provider was notified.

## 2015-06-16 NOTE — ED Notes (Signed)
Pt up and ambulatory to restroom.

## 2015-06-16 NOTE — Telephone Encounter (Signed)
Patient is pregnant and per Robyne Askew, NP needs to come to [facility] on Wed, 5/17 for a repeat b-hcg. Attempted to call mobile #, got patient's mother who gave me a different # to call. I called this # and got a voice mail. Voice mail left stating I am calling to schedule f/u labs, please return my call at the clinics. Also called patient's mother back and asked her to have patient return my call to schedule labs. Mother stated she was going to stop by the patient's house and she would let her know.

## 2015-06-16 NOTE — ED Provider Notes (Signed)
CSN: [id number]     Arrival date & time 06/16/15  0957 History   First MD Initiated Contact with Patient 06/16/15 1011     Chief Complaint  Patient presents with  . Abdominal Pain     (Consider location/radiation/quality/duration/timing/severity/associated sxs/prior Treatment) HPI   G2P0 (2 miscarriages at 13 and 5 weeks, no known reason) p/w intermittent lower abdominal and low back pain that has been ongoing x 1 week.  Had two episodes of vomiting overnight.  Has had increased urination without pain.  Took a home pregnancy test today that was positive.  LMP April 12.  Periods are usually very regular.  She is sexually active and does not always use protection.  Denies fevers, abnormal vaginal discharge or bleeding, change in bowel habits.  No hx abdominal surgeries.    Past Medical History  Diagnosis Date  . Depression   . Anxiety    Past Surgical History  Procedure Laterality Date  . Mass excision  10/29/2011    Procedure: EXCISION MASS;  Surgeon: Harl Bowie, MD;  Location: [facility];  Service: General;  Laterality: Bilateral;  excision of bilateral breast masses  . Breast surgery     Family History  Problem Relation Age of Onset  . Cancer Maternal Grandmother     lung cancer  . Cancer Other     bladder cancer   Social History  Substance Use Topics  . Smoking status: Never Smoker   . Smokeless tobacco: None  . Alcohol Use: 1.2 - 1.8 oz/week    2-3 Shots of liquor per week   OB History    No data available     Review of Systems  All other systems reviewed and are negative.     Allergies  Review of patient's allergies indicates no known allergies.  Home Medications   Prior to Admission medications   Medication Sig Start Date End Date Taking? Authorizing Provider  acetaminophen (TYLENOL) 500 MG tablet Take 1,500 mg by mouth every 6 (six) hours as needed for moderate pain.   Yes Historical Provider, MD  FLUoxetine (PROZAC) 40 MG capsule Take 40 mg by mouth  daily.   Yes Historical Provider, MD  traZODone (DESYREL) 100 MG tablet Take 1 tablet (100 mg total) by mouth at bedtime. Patient taking differently: Take 100 mg by mouth at bedtime as needed for sleep.  12/03/14  Yes Derrill Center, NP  FLUoxetine (PROZAC) 20 MG capsule Take 1 capsule (20 mg total) by mouth daily. Patient not taking: Reported on 04/07/2015 12/03/14   Derrill Center, NP  HYDROcodone-homatropine (HYCODAN) 5-1.5 MG/5ML syrup Take 5 mLs by mouth every 6 (six) hours as needed for cough. Patient not taking: Reported on 06/16/2015 04/04/15   Montine Circle, PA-C  ondansetron (ZOFRAN) 4 MG tablet Take 1 tablet (4 mg total) by mouth every 6 (six) hours. Patient not taking: Reported on 06/16/2015 04/04/15   Montine Circle, PA-C   BP 126/75 mmHg  Pulse 110  Temp(Src) 98.4 F (36.9 C) (Oral)  Resp 18  SpO2 100% Physical Exam  Constitutional: She appears well-developed and well-nourished. No distress.  HENT:  Head: Normocephalic and atraumatic.  Neck: Neck supple.  Cardiovascular: Regular rhythm.  Tachycardia present.   Pulmonary/Chest: Effort normal and breath sounds normal. No respiratory distress. She has no wheezes. She has no rales.  Abdominal: Soft. She exhibits no distension and no mass. There is tenderness. There is no rebound and no guarding.  Mild tenderness throughout lower abdomen  Genitourinary: Uterus is tender. Uterus is not enlarged. Cervix exhibits no motion tenderness. Right adnexum displays no mass, no tenderness and no fullness. Left adnexum displays no mass, no tenderness and no fullness. No erythema, tenderness or bleeding in the vagina. No foreign body around the vagina. No signs of injury around the vagina. No vaginal discharge found.  Small amount of thick white vaginal discharge, appears physiologic.    Neurological: She is alert. She exhibits normal muscle tone.  Skin: She is not diaphoretic.  Nursing note and vitals reviewed.   ED Course  Procedures  (including critical care time) Labs Review Labs Reviewed  BASIC METABOLIC PANEL - Abnormal; Notable for the following:    Potassium 3.2 (*)    All other components within normal limits  CBC WITH DIFFERENTIAL/PLATELET - Abnormal; Notable for the following:    Hemoglobin 11.2 (*)    HCT 34.8 (*)    MCV 72.7 (*)    MCH 23.4 (*)    All other components within normal limits  URINALYSIS, ROUTINE W REFLEX MICROSCOPIC (NOT AT [facility]) - Abnormal; Notable for the following:    Hgb urine dipstick TRACE (*)    All other components within normal limits  HCG, QUANTITATIVE, PREGNANCY - Abnormal; Notable for the following:    hCG, Beta Chain, Quant, S 389 (*)    All other components within normal limits  URINE MICROSCOPIC-ADD ON - Abnormal; Notable for the following:    Squamous Epithelial / LPF 0-5 (*)    All other components within normal limits  I-STAT BETA HCG BLOOD, ED ([facility], [facility], [facility] ONLY) - Abnormal; Notable for the following:    I-stat hCG, quantitative 329.2 (*)    All other components within normal limits  RPR  HIV ANTIBODY (ROUTINE TESTING)  GC/CHLAMYDIA PROBE AMP ([place]) NOT AT [facility]    Imaging Review US Ob Comp Less 14 Wks  06/16/2015  CLINICAL DATA:  Pelvic pain for 3 days, progressing. Positive pregnancy test EXAM: OBSTETRIC <14 WK [place] AND TRANSVAGINAL OB US TECHNIQUE: Both transabdominal and transvaginal ultrasound examinations were performed for complete evaluation of the gestation as well as the maternal uterus, adnexal regions, and pelvic cul-de-sac. Transvaginal technique was performed to assess early pregnancy. COMPARISON:  None. FINDINGS: Intrauterine gestational sac: Not visualized Yolk sac:  Not visualized Embryo:  Not visualized Cardiac Activity: Not visualized Subchorionic hemorrhage:  None visualized. Maternal uterus/adnexae: The uterus measures 8.7 x 5.0 x 6.2 cm. There is no intrauterine mass. The endometrium measures 13 mm with a smooth contour. The right ovary measures 3.5  x 2.1 x 2.7 cm. Left ovary measures 3.7 x 2.0 x 2.0 cm. Within the right ovary, there is a mildly complex predominantly cystic structure measuring 1.7 x 1.1 x 1.3 cm, a likely mildly hemorrhagic corpus luteum. There is no other extrauterine pelvic or adnexal mass. There is trace free pelvic fluid. IMPRESSION: No demonstrable intrauterine mass or gestation. Suspect hemorrhagic corpus luteum in the right ovary. Trace free pelvic fluid may be physiologic. Given these findings and positive pregnancy test, differential considerations include intrauterine gestation too early to be seen by either transabdominal or transvaginal technique; recent spontaneous abortion; ectopic gestation. Given this circumstance, close clinical and laboratory surveillance advised. Timing of repeat sonographic examination will in part depend on beta HCG findings going forward. Electronically Signed   By: Lowella Grip III M.D.   On: 06/16/2015 13:09   US Ob Transvaginal  06/16/2015  CLINICAL DATA:  Pelvic pain for 3 days, progressing.  Positive pregnancy test EXAM: OBSTETRIC <14 WK [place] AND TRANSVAGINAL OB US TECHNIQUE: Both transabdominal and transvaginal ultrasound examinations were performed for complete evaluation of the gestation as well as the maternal uterus, adnexal regions, and pelvic cul-de-sac. Transvaginal technique was performed to assess early pregnancy. COMPARISON:  None. FINDINGS: Intrauterine gestational sac: Not visualized Yolk sac:  Not visualized Embryo:  Not visualized Cardiac Activity: Not visualized Subchorionic hemorrhage:  None visualized. Maternal uterus/adnexae: The uterus measures 8.7 x 5.0 x 6.2 cm. There is no intrauterine mass. The endometrium measures 13 mm with a smooth contour. The right ovary measures 3.5 x 2.1 x 2.7 cm. Left ovary measures 3.7 x 2.0 x 2.0 cm. Within the right ovary, there is a mildly complex predominantly cystic structure measuring 1.7 x 1.1 x 1.3 cm, a likely mildly hemorrhagic corpus  luteum. There is no other extrauterine pelvic or adnexal mass. There is trace free pelvic fluid. IMPRESSION: No demonstrable intrauterine mass or gestation. Suspect hemorrhagic corpus luteum in the right ovary. Trace free pelvic fluid may be physiologic. Given these findings and positive pregnancy test, differential considerations include intrauterine gestation too early to be seen by either transabdominal or transvaginal technique; recent spontaneous abortion; ectopic gestation. Given this circumstance, close clinical and laboratory surveillance advised. Timing of repeat sonographic examination will in part depend on beta HCG findings going forward. Electronically Signed   By: Lowella Grip III M.D.   On: 06/16/2015 13:09   I have personally reviewed and evaluated these images and lab results as part of my medical decision-making.   EKG Interpretation None      MDM   Final diagnoses:  Pelvic pain during pregnancy    Afebrile, nontoxic patient with low abdominal and low back pain x 1 week, vomited x 2, positive pregnancy test.  Abdominal exam is nonsurgical.  Likely normal discomfort in early pregnancy.  HCG is 392.  There is no bleeding.  US unremarkable, but unable to r/o ectopic given stage of pregnancy.  Labs otherwise remarkable for mild hypokalemia and mild anemia.   GC/Chlam pending. Wet prep not performed.  Lab received sample much later than we sent it.  Pt is not have any abnormal vaginal discharge.  She will follow closely with gyn in the next two days and will discuss with them.  Will need repeat HCG and repeat ultrasound, which she demonstrates understanding of. Pt is taking prozac (category C), she asked me about the safety in continuing this.  I have discussed with patient the safety of medications in pregnancy, the fact that our knowledge of drug safety in pregnancy is limited and that I can only make recommendations based on what is currently known at this time and the safety  ratings given to medications.  We discussed that medications may pass through the placenta and have encouraged them to make their own decisions regarding the medications used in light of this information.  She will discuss the prozac usage with her PCP tomorrow.  [facility].  D/C home with close PCP, gyn follow up.  Discussed result, findings, treatment, and follow up  with patient.  Pt given return precautions.  Pt verbalizes understanding and agrees with plan.        Clayton Bibles, PA-C 06/16/15 1449  Merrily Pew, MD 06/16/15 [phone]

## 2015-06-17 LAB — RPR: RPR Ser Ql: NONREACTIVE

## 2015-06-17 LAB — HIV ANTIBODY (ROUTINE TESTING W REFLEX): HIV SCREEN 4TH GENERATION: NONREACTIVE

## 2015-06-17 LAB — GC/CHLAMYDIA PROBE AMP (~~LOC~~) NOT AT ARMC
CHLAMYDIA, DNA PROBE: NEGATIVE
NEISSERIA GONORRHEA: NEGATIVE

## 2015-06-18 ENCOUNTER — Inpatient Hospital Stay (HOSPITAL_COMMUNITY)
Admission: AD | Admit: 2015-06-18 | Discharge: 2015-06-18 | Disposition: A | Discharge status: Home / Self Care | Payer: Medicaid Other | Source: Ambulatory Visit | Attending: Obstetrics and Gynecology | Admitting: Obstetrics and Gynecology

## 2015-06-18 ENCOUNTER — Encounter (HOSPITAL_COMMUNITY): Payer: Self-pay | Admitting: *Deleted

## 2015-06-18 DIAGNOSIS — O26891 Other specified pregnancy related conditions, first trimester: Secondary | ICD-10-CM | POA: Diagnosis present

## 2015-06-18 DIAGNOSIS — Z3A01 Less than 8 weeks gestation of pregnancy: Secondary | ICD-10-CM | POA: Insufficient documentation

## 2015-06-18 DIAGNOSIS — Z3201 Encounter for pregnancy test, result positive: Secondary | ICD-10-CM

## 2015-06-18 DIAGNOSIS — E349 Endocrine disorder, unspecified: Secondary | ICD-10-CM

## 2015-06-18 LAB — HCG, QUANTITATIVE, PREGNANCY: hCG, Beta Chain, Quant, S: 1088 m[IU]/mL — ABNORMAL HIGH (ref <5)

## 2015-06-18 NOTE — [facility] Note (Signed)
Pt referred to [facility] from [facility] for F/U labs, was seen there 2 days ago for pelvic pain.  Pt denies pain or bleeding today.

## 2015-06-18 NOTE — MAU Provider Note (Signed)
History   [id number]   Chief Complaint  Patient presents with  . Follow-up    HPI Katrina Stewart is a 20 y.o. female G1P0 here for follow-up BHCG.  Upon review of the records patient was first seen on 5/15 at [facility] ED for abdominal pain.   BHCG on that day was 389.  Ultrasound showed no IUP.  GC/CT  was collected.  Results were negative.   Pt discharged home in stable condition.   Pt here today with no report of abdominal pain or vaginal bleeding.   All other systems negative.   Patient's last menstrual period was 05/15/2015 (approximate).  OB History  Gravida Para Term Preterm AB SAB TAB Ectopic Multiple Living  1             # Outcome Date GA Lbr Len/2nd Weight Sex Delivery Anes PTL Lv  1 Current               Past Medical History  Diagnosis Date  . Depression   . Anxiety     Family History  Problem Relation Age of Onset  . Cancer Maternal Grandmother     lung cancer  . Cancer Other     bladder cancer    Social History   Social History  . Marital Status: Single    Spouse Name: N/A  . Number of Children: N/A  . Years of Education: N/A   Social History Main Topics  . Smoking status: Never Smoker   . Smokeless tobacco: Not on file  . Alcohol Use: 1.2 - 1.8 oz/week    2-3 Shots of liquor per week  . Drug Use: Yes    Special: Marijuana  . Sexual Activity: Yes    Birth Control/ Protection: None   Other Topics Concern  . Not on file   Social History Narrative    No Known Allergies  No current facility-administered medications on file prior to encounter.   Current Outpatient Prescriptions on File Prior to Encounter  Medication Sig Dispense Refill  . acetaminophen (TYLENOL) 500 MG tablet Take 1,500 mg by mouth every 6 (six) hours as needed for moderate pain.    Marland Kitchen FLUoxetine (PROZAC) 20 MG capsule Take 1 capsule (20 mg total) by mouth daily. (Patient not taking: Reported on 04/07/2015) 30 capsule 3  . FLUoxetine (PROZAC) 40 MG capsule Take 40 mg by mouth  daily.    . traZODone (DESYREL) 100 MG tablet Take 1 tablet (100 mg total) by mouth at bedtime. (Patient taking differently: Take 100 mg by mouth at bedtime as needed for sleep. ) 30 tablet 0     Physical Exam   Filed Vitals:   06/18/15 1027  BP: 124/72  Pulse: 88  Temp: 98.6 F (37 C)  TempSrc: Oral  Resp: 18  Height: 5\' 8"  (1.727 m)  Weight: 222 lb (100.699 kg)    Physical Exam  Nursing note and vitals reviewed. Constitutional: She is oriented to person, place, and time. She appears well-developed and well-nourished. No distress.  HENT:  Head: Normocephalic and atraumatic.  Eyes: Conjunctivae are normal. Right eye exhibits no discharge. Left eye exhibits no discharge. No scleral icterus.  Neck: Normal range of motion.  Cardiovascular: Normal rate.   No murmur heard. Respiratory: Effort normal. No respiratory distress.  Musculoskeletal: Normal range of motion.  Neurological: She is alert and oriented to person, place, and time.  Skin: Skin is warm and dry. She is not diaphoretic.  Psychiatric: She has a normal mood  and affect. Her behavior is normal. Judgment and thought content normal.    MAU Course  Procedures Component     Latest Ref Rng 06/16/2015 06/18/2015  HCG, Beta Chain, Quant, S     <5 mIU/mL 389 (H) 1088 (H)   MDM Pt unsure of who she will use for prenatal care Appropriate rise in BHCG Denies abdominal pain or vaginal bleeding Will schedule for outpatient ultrasound to determine viability  Assessment and Plan  20 y.o. G1P0 at [redacted]w[redacted]d wks Pregnancy Follow-up BHCG Pregnancy of Unknown Location  P; Discharge home Discussed reasons to return to MAU Outpatient ultrasound ordered Pregnancy verification letter & provider list given  Jorje Guild, NP 06/18/2015 12:22 PM

## 2015-06-18 NOTE — Discharge Instructions (Signed)
First Trimester of Pregnancy The first trimester of pregnancy is from week 1 until the end of week 12 (months 1 through 3). A week after a sperm fertilizes an egg, the egg will implant on the wall of the uterus. This embryo will begin to develop into a baby. Genes from you and your partner are forming the baby. The female genes determine whether the baby is a boy or a girl. At 6-8 weeks, the eyes and face are formed, and the heartbeat can be seen on ultrasound. At the end of 12 weeks, all the baby's organs are formed.  Now that you are pregnant, you will want to do everything you can to have a healthy baby. Two of the most important things are to get good prenatal care and to follow your health care provider's instructions. Prenatal care is all the medical care you receive before the baby's birth. This care will help prevent, find, and treat any problems during the pregnancy and childbirth. BODY CHANGES Your body goes through many changes during pregnancy. The changes vary from woman to woman.   You may gain or lose a couple of pounds at first.  You may feel sick to your stomach (nauseous) and throw up (vomit). If the vomiting is uncontrollable, call your health care provider.  You may tire easily.  You may develop headaches that can be relieved by medicines approved by your health care provider.  You may urinate more often. Painful urination may mean you have a bladder infection.  You may develop heartburn as a result of your pregnancy.  You may develop constipation because certain hormones are causing the muscles that push waste through your intestines to slow down.  You may develop hemorrhoids or swollen, bulging veins (varicose veins).  Your breasts may begin to grow larger and become tender. Your nipples may stick out more, and the tissue that surrounds them (areola) may become darker.  Your gums may bleed and may be sensitive to brushing and flossing.  Dark spots or blotches (chloasma,  mask of pregnancy) may develop on your face. This will likely fade after the baby is born.  Your menstrual periods will stop.  You may have a loss of appetite.  You may develop cravings for certain kinds of food.  You may have changes in your emotions from day to day, such as being excited to be pregnant or being concerned that something may go wrong with the pregnancy and baby.  You may have more vivid and strange dreams.  You may have changes in your hair. These can include thickening of your hair, rapid growth, and changes in texture. Some women also have hair loss during or after pregnancy, or hair that feels dry or thin. Your hair will most likely return to normal after your baby is born. WHAT TO EXPECT AT YOUR PRENATAL VISITS During a routine prenatal visit:  You will be weighed to make sure you and the baby are growing normally.  Your blood pressure will be taken.  Your abdomen will be measured to track your baby's growth.  The fetal heartbeat will be listened to starting around week 10 or 12 of your pregnancy.  Test results from any previous visits will be discussed. Your health care provider may ask you:  How you are feeling.  If you are feeling the baby move.  If you have had any abnormal symptoms, such as leaking fluid, bleeding, severe headaches, or abdominal cramping.  If you are using any tobacco products,   including cigarettes, chewing tobacco, and electronic cigarettes.  If you have any questions. Other tests that may be performed during your first trimester include:  Blood tests to find your blood type and to check for the presence of any previous infections. They will also be used to check for low iron levels (anemia) and Rh antibodies. Later in the pregnancy, blood tests for diabetes will be done along with other tests if problems develop.  Urine tests to check for infections, diabetes, or protein in the urine.  An ultrasound to confirm the proper growth  and development of the baby.  An amniocentesis to check for possible genetic problems.  Fetal screens for spina bifida and Down syndrome.  You may need other tests to make sure you and the baby are doing well.  HIV (human immunodeficiency virus) testing. Routine prenatal testing includes screening for HIV, unless you choose not to have this test. HOME CARE INSTRUCTIONS  Medicines  Follow your health care provider's instructions regarding medicine use. Specific medicines may be either safe or unsafe to take during pregnancy.  Take your prenatal vitamins as directed.  If you develop constipation, try taking a stool softener if your health care provider approves. Diet  Eat regular, well-balanced meals. Choose a variety of foods, such as meat or vegetable-based protein, fish, milk and low-fat dairy products, vegetables, fruits, and whole grain breads and cereals. Your health care provider will help you determine the amount of weight gain that is right for you.  Avoid raw meat and uncooked cheese. These carry germs that can cause birth defects in the baby.  Eating four or five small meals rather than three large meals a day may help relieve nausea and vomiting. If you start to feel nauseous, eating a few soda crackers can be helpful. Drinking liquids between meals instead of during meals also seems to help nausea and vomiting.  If you develop constipation, eat more high-fiber foods, such as fresh vegetables or fruit and whole grains. Drink enough fluids to keep your urine clear or pale yellow. Activity and Exercise  Exercise only as directed by your health care provider. Exercising will help you:  Control your weight.  Stay in shape.  Be prepared for labor and delivery.  Experiencing pain or cramping in the lower abdomen or low back is a good sign that you should stop exercising. Check with your health care provider before continuing normal exercises.  Try to avoid standing for long  periods of time. Move your legs often if you must stand in one place for a long time.  Avoid heavy lifting.  Wear low-heeled shoes, and practice good posture.  You may continue to have sex unless your health care provider directs you otherwise. Relief of Pain or Discomfort  Wear a good support bra for breast tenderness.   Take warm sitz baths to soothe any pain or discomfort caused by hemorrhoids. Use hemorrhoid cream if your health care provider approves.   Rest with your legs elevated if you have leg cramps or low back pain.  If you develop varicose veins in your legs, wear support hose. Elevate your feet for 15 minutes, 3-4 times a day. Limit salt in your diet. Prenatal Care  Schedule your prenatal visits by the twelfth week of pregnancy. They are usually scheduled monthly at first, then more often in the last 2 months before delivery.  Write down your questions. Take them to your prenatal visits.  Keep all your prenatal visits as directed by your   health care provider. Safety  Wear your seat belt at all times when driving.  Make a list of emergency phone numbers, including numbers for family, friends, the hospital, and police and fire departments. General Tips  Ask your health care provider for a referral to a local prenatal education class. Begin classes no later than at the beginning of month 6 of your pregnancy.  Ask for help if you have counseling or nutritional needs during pregnancy. Your health care provider can offer advice or refer you to specialists for help with various needs.  Do not use hot tubs, steam rooms, or saunas.  Do not douche or use tampons or scented sanitary pads.  Do not cross your legs for long periods of time.  Avoid cat litter boxes and soil used by cats. These carry germs that can cause birth defects in the baby and possibly loss of the fetus by miscarriage or stillbirth.  Avoid all smoking, herbs, alcohol, and medicines not prescribed by  your health care provider. Chemicals in these affect the formation and growth of the baby.  Do not use any tobacco products, including cigarettes, chewing tobacco, and electronic cigarettes. If you need help quitting, ask your health care provider. You may receive counseling support and other resources to help you quit.  Schedule a dentist appointment. At home, brush your teeth with a soft toothbrush and be gentle when you floss. SEEK MEDICAL CARE IF:   You have dizziness.  You have mild pelvic cramps, pelvic pressure, or nagging pain in the abdominal area.  You have persistent nausea, vomiting, or diarrhea.  You have a bad smelling vaginal discharge.  You have pain with urination.  You notice increased swelling in your face, hands, legs, or ankles. SEEK IMMEDIATE MEDICAL CARE IF:   You have a fever.  You are leaking fluid from your vagina.  You have spotting or bleeding from your vagina.  You have severe abdominal cramping or pain.  You have rapid weight gain or loss.  You vomit blood or material that looks like coffee grounds.  You are exposed to German measles and have never had them.  You are exposed to fifth disease or chickenpox.  You develop a severe headache.  You have shortness of breath.  You have any kind of trauma, such as from a fall or a car accident.   This information is not intended to replace advice given to you by your health care provider. Make sure you discuss any questions you have with your health care provider.   Document Released: 01/12/2001 Document Revised: 02/08/2014 Document Reviewed: 11/28/2012 Elsevier Interactive Patient Education 2016 [employer]  

## 2015-06-23 ENCOUNTER — Inpatient Hospital Stay (HOSPITAL_COMMUNITY)
Admission: AD | Admit: 2015-06-23 | Discharge: 2015-06-23 | Disposition: A | Discharge status: Home / Self Care | Payer: Medicaid Other | Source: Ambulatory Visit | Attending: Obstetrics & Gynecology | Admitting: Obstetrics & Gynecology

## 2015-06-23 ENCOUNTER — Encounter (HOSPITAL_COMMUNITY): Payer: Self-pay | Admitting: *Deleted

## 2015-06-23 ENCOUNTER — Inpatient Hospital Stay (HOSPITAL_COMMUNITY): Payer: Medicaid Other

## 2015-06-23 DIAGNOSIS — O26899 Other specified pregnancy related conditions, unspecified trimester: Secondary | ICD-10-CM

## 2015-06-23 DIAGNOSIS — Z3A01 Less than 8 weeks gestation of pregnancy: Secondary | ICD-10-CM | POA: Insufficient documentation

## 2015-06-23 DIAGNOSIS — F329 Major depressive disorder, single episode, unspecified: Secondary | ICD-10-CM | POA: Insufficient documentation

## 2015-06-23 DIAGNOSIS — R1030 Lower abdominal pain, unspecified: Secondary | ICD-10-CM | POA: Diagnosis present

## 2015-06-23 DIAGNOSIS — F419 Anxiety disorder, unspecified: Secondary | ICD-10-CM | POA: Insufficient documentation

## 2015-06-23 DIAGNOSIS — O99341 Other mental disorders complicating pregnancy, first trimester: Secondary | ICD-10-CM | POA: Insufficient documentation

## 2015-06-23 DIAGNOSIS — R109 Unspecified abdominal pain: Secondary | ICD-10-CM

## 2015-06-23 DIAGNOSIS — O3680X0 Pregnancy with inconclusive fetal viability, not applicable or unspecified: Secondary | ICD-10-CM

## 2015-06-23 DIAGNOSIS — O26891 Other specified pregnancy related conditions, first trimester: Secondary | ICD-10-CM | POA: Insufficient documentation

## 2015-06-23 DIAGNOSIS — Z3491 Encounter for supervision of normal pregnancy, unspecified, first trimester: Secondary | ICD-10-CM

## 2015-06-23 DIAGNOSIS — O9989 Other specified diseases and conditions complicating pregnancy, childbirth and the puerperium: Secondary | ICD-10-CM

## 2015-06-23 LAB — URINALYSIS, ROUTINE W REFLEX MICROSCOPIC
BILIRUBIN URINE: NEGATIVE
Glucose, UA: NEGATIVE mg/dL
Ketones, ur: NEGATIVE mg/dL
Leukocytes, UA: NEGATIVE
Nitrite: NEGATIVE
PROTEIN: NEGATIVE mg/dL
Specific Gravity, Urine: 1.025 (ref 1.005–1.030)
pH: 5.5 (ref 5.0–8.0)

## 2015-06-23 LAB — URINE MICROSCOPIC-ADD ON

## 2015-06-23 NOTE — MAU Provider Note (Signed)
First Provider Initiated Contact with Patient 06/23/15 1938      Chief Complaint: abdominal cramping.   Katrina Stewart is  20 y.o. G1P0 at [redacted]w[redacted]d presents complaining of lower abdominal pain/cramping.  Feels it "all the way across" her lower abdomen.  No bleeding. Was followed last week w/appropriately rising QHCGs.  [place] 8 days ago did not show where the pregnancy is.    Obstetrical/Gynecological History: OB History    Gravida Para Term Preterm AB TAB SAB Ectopic Multiple Living   1              Past Medical History: Past Medical History  Diagnosis Date  . Depression   . Anxiety     Past Surgical History: Past Surgical History  Procedure Laterality Date  . Mass excision  10/29/2011    Procedure: EXCISION MASS;  Surgeon: Harl Bowie, MD;  Location: [facility];  Service: General;  Laterality: Bilateral;  excision of bilateral breast masses  . Breast surgery      Family History: Family History  Problem Relation Age of Onset  . Cancer Maternal Grandmother     lung cancer  . Cancer Other     bladder cancer    Social History: Social History  Substance Use Topics  . Smoking status: Never Smoker   . Smokeless tobacco: None  . Alcohol Use: 1.2 - 1.8 oz/week    2-3 Shots of liquor per week    Allergies: No Known Allergies  Meds:  Prescriptions prior to admission  Medication Sig Dispense Refill Last Dose  . acetaminophen (TYLENOL) 500 MG tablet Take 1,500 mg by mouth every 6 (six) hours as needed for moderate pain.   06/23/2015 at Unknown time  . FLUoxetine (PROZAC) 20 MG capsule Take 1 capsule (20 mg total) by mouth daily. (Patient not taking: Reported on 04/07/2015) 30 capsule 3 Not Taking at Unknown time  . FLUoxetine (PROZAC) 40 MG capsule Take 40 mg by mouth daily.   06/15/2015 at Unknown time  . traZODone (DESYREL) 100 MG tablet Take 1 tablet (100 mg total) by mouth at bedtime. (Patient taking differently: Take 100 mg by mouth at bedtime as needed for sleep. ) 30 tablet  0 Past Week at Unknown time    Review of Systems   Constitutional: Negative for fever and chills Eyes: Negative for visual disturbances Respiratory: Negative for shortness of breath, dyspnea Cardiovascular: Negative for chest pain or palpitations  Gastrointestinal: Negative for vomiting, diarrhea and constipation Genitourinary: Negative for dysuria and urgency Musculoskeletal: Negative for back pain, joint pain, myalgias.  Normal ROM  Neurological: Negative for dizziness and headaches    Physical Exam  Blood pressure 129/73, pulse 100, temperature 99 F (37.2 C), temperature source Oral, resp. rate 16, height 5\' 8"  (1.727 m), weight 100.245 kg (221 lb), last menstrual period 05/15/2015, SpO2 100 %. GENERAL: Well-developed, well-nourished female in no acute distress.  LUNGS: Clear to auscultation bilaterally.  HEART: Regular rate and rhythm. ABDOMEN: Soft, nontender, nondistended, gravid.  EXTREMITIES: Nontender, no edema, 2+ distal pulses. DTR's 2+ CERVICAL EXAM: LTC Labs: No results found for this or any previous visit (from the past 24 hour(s)). Imaging Studies:   Reading Physician Reading Date Result Priority    Abigail Miyamoto, MD 06/23/2015          Narrative        CLINICAL DATA: Cramping. 5 weeks 4 days pregnant by last menstrual.  EXAM: TRANSVAGINAL OB ULTRASOUND  TECHNIQUE: Transvaginal ultrasound was performed for complete evaluation  of the gestation as well as the maternal uterus, adnexal regions, and pelvic cul-de-sac.  COMPARISON: 06/16/2015  FINDINGS: Intrauterine gestational sac: Present  Yolk sac: Present  Embryo: Absent  MSD: 8 mm  5 w  4 d  Subchorionic hemorrhage: Absent  Maternal uterus/adnexae: Bilateral probable corpus luteal cysts identified. On the right, 1.6 cm. On the left, 1.8 cm.  IMPRESSION: 1. Presence of a gestational sac and yolk sac. Absent embryo, likely due to early gestational age. Based on mean sac  diameter of 8 mm, gestational age of [redacted] weeks 4 days. 2. Probable bilateral corpus luteal cysts.            Assessment: Katrina Stewart is  20 y.o. G1P0 at [redacted]w[redacted]d presents with IUP, too early to confirm viability.  Plan: Outpt [place] 10 days  CRESENZO-DISHMAN,Calia Napp 5/22/20177:44 PM

## 2015-06-23 NOTE — MAU Note (Signed)
Pt reports really bad cramping in lower abd on right and left. States she took tylenol earlier but pain came back stronger. Denies bleeding, dysuria.

## 2015-06-23 NOTE — Discharge Instructions (Signed)
Ultrasound will call you to schedule--it should be some time on June 1.  A provider from the [facility] will go over the results with you.

## 2015-06-25 LAB — URINE CULTURE: Special Requests: NORMAL

## 2015-06-25 NOTE — Telephone Encounter (Signed)
Pt went to MAU. No further calls needed at this time.

## 2015-07-02 ENCOUNTER — Encounter: Payer: Self-pay | Admitting: Family

## 2015-07-02 ENCOUNTER — Ambulatory Visit (INDEPENDENT_AMBULATORY_CARE_PROVIDER_SITE_OTHER): Payer: Self-pay | Admitting: Family

## 2015-07-02 ENCOUNTER — Ambulatory Visit (HOSPITAL_COMMUNITY)
Admission: RE | Admit: 2015-07-02 | Discharge: 2015-07-02 | Disposition: A | Discharge status: Home / Self Care | Payer: Medicaid Other | Source: Ambulatory Visit | Attending: Student | Admitting: Student

## 2015-07-02 DIAGNOSIS — O3680X0 Pregnancy with inconclusive fetal viability, not applicable or unspecified: Secondary | ICD-10-CM | POA: Insufficient documentation

## 2015-07-02 DIAGNOSIS — Z3A01 Less than 8 weeks gestation of pregnancy: Secondary | ICD-10-CM | POA: Diagnosis not present

## 2015-07-02 DIAGNOSIS — Z3481 Encounter for supervision of other normal pregnancy, first trimester: Secondary | ICD-10-CM

## 2015-07-02 DIAGNOSIS — E349 Endocrine disorder, unspecified: Secondary | ICD-10-CM | POA: Diagnosis present

## 2015-07-02 DIAGNOSIS — Z3201 Encounter for pregnancy test, result positive: Secondary | ICD-10-CM

## 2015-07-02 MED ORDER — CONCEPT DHA 53.5-38-1 MG PO CAPS: 1.0000 | ORAL_CAPSULE | Freq: Every day | ORAL | Status: DC

## 2015-07-02 NOTE — Progress Notes (Signed)
Ultrasounds Results Note  SUBJECTIVE HPI:  [name] is a 20 y.o. G1P0 at [redacted]w[redacted]d by LMP who presents to the [facility] for followup ultrasound results. The patient denies abdominal pain or vaginal bleeding.  Upon review of the patient's records, patient was first seen in ED on 06/16/15 for abdominal pain.   BHCG on that day was 389.  Ultrasound showed no IUGS.  Last seen in MAU on 06/18/15. BHCG was 1088.  Repeat ultrasound was performed earlier today.   Past Medical History  Diagnosis Date  . Depression   . Anxiety    Past Surgical History  Procedure Laterality Date  . Mass excision  10/29/2011    Procedure: EXCISION MASS;  Surgeon: Harl Bowie, MD;  Location: [facility];  Service: General;  Laterality: Bilateral;  excision of bilateral breast masses  . Breast surgery     Social History   Social History  . Marital Status: Single    Spouse Name: N/A  . Number of Children: N/A  . Years of Education: N/A   Occupational History  . Not on file.   Social History Main Topics  . Smoking status: Never Smoker   . Smokeless tobacco: Not on file  . Alcohol Use: 1.2 - 1.8 oz/week    2-3 Shots of liquor per week  . Drug Use: Yes    Special: Marijuana  . Sexual Activity: Yes    Birth Control/ Protection: None   Other Topics Concern  . Not on file   Social History Narrative   Current Outpatient Prescriptions on File Prior to Visit  Medication Sig Dispense Refill  . acetaminophen (TYLENOL) 500 MG tablet Take 1,000 mg by mouth every 6 (six) hours as needed for moderate pain.     Marland Kitchen FLUoxetine (PROZAC) 40 MG capsule Take 40 mg by mouth daily.    . traZODone (DESYREL) 100 MG tablet Take 1 tablet (100 mg total) by mouth at bedtime. (Patient taking differently: Take 100 mg by mouth at bedtime as needed for sleep. ) 30 tablet 0   No current facility-administered medications on file prior to visit.   No Known Allergies  I have reviewed patient's Past Medical Hx,  Surgical Hx, Family Hx, Social Hx, medications and allergies.   Review of Systems Review of Systems  Constitutional: Negative for fever and chills.  Gastrointestinal: Negative for nausea, vomiting, abdominal pain, diarrhea and constipation.  Genitourinary: Negative for dysuria.  Musculoskeletal: Negative for back pain.  Neurological: Negative for dizziness and weakness.    Physical Exam  LMP 05/15/2015 (Approximate)  GENERAL: Well-developed, well-nourished female in no acute distress.  HEENT: Normocephalic, atraumatic.   LUNGS: Effort normal ABDOMEN: soft, non-tender HEART: Regular rate  SKIN: Warm, dry and without erythema PSYCH: Normal mood and affect NEURO: Alert and oriented x 4  LAB RESULTS No results found for this or any previous visit (from the past 24 hour(s)).  IMAGING US Ob Comp Less 14 Wks  06/16/2015  CLINICAL DATA:  Pelvic pain for 3 days, progressing. Positive pregnancy test EXAM: OBSTETRIC <14 WK [place] AND TRANSVAGINAL OB US TECHNIQUE: Both transabdominal and transvaginal ultrasound examinations were performed for complete evaluation of the gestation as well as the maternal uterus, adnexal regions, and pelvic cul-de-sac. Transvaginal technique was performed to assess early pregnancy. COMPARISON:  None. FINDINGS: Intrauterine gestational sac: Not visualized Yolk sac:  Not visualized Embryo:  Not visualized Cardiac Activity: Not visualized Subchorionic hemorrhage:  None visualized. Maternal uterus/adnexae: The uterus measures 8.7 x  5.0 x 6.2 cm. There is no intrauterine mass. The endometrium measures 13 mm with a smooth contour. The right ovary measures 3.5 x 2.1 x 2.7 cm. Left ovary measures 3.7 x 2.0 x 2.0 cm. Within the right ovary, there is a mildly complex predominantly cystic structure measuring 1.7 x 1.1 x 1.3 cm, a likely mildly hemorrhagic corpus luteum. There is no other extrauterine pelvic or adnexal mass. There is trace free pelvic fluid. IMPRESSION: No demonstrable  intrauterine mass or gestation. Suspect hemorrhagic corpus luteum in the right ovary. Trace free pelvic fluid may be physiologic. Given these findings and positive pregnancy test, differential considerations include intrauterine gestation too early to be seen by either transabdominal or transvaginal technique; recent spontaneous abortion; ectopic gestation. Given this circumstance, close clinical and laboratory surveillance advised. Timing of repeat sonographic examination will in part depend on beta HCG findings going forward. Electronically Signed   By: Lowella Grip III M.D.   On: 06/16/2015 13:09   US Ob Transvaginal  07/02/2015  CLINICAL DATA:  First trimester pregnancy with inconclusive fetal viability. Pelvic pain. Gestational age by LMP of 6 weeks 6 days. EXAM: TRANSVAGINAL OB ULTRASOUND TECHNIQUE: Transvaginal ultrasound was performed for complete evaluation of the gestation as well as the maternal uterus, adnexal regions, and pelvic cul-de-sac. COMPARISON:  06/23/2015 FINDINGS: Intrauterine gestational sac: Single Yolk sac:  Visualized Embryo:  Visualized Cardiac Activity: Visualized Heart Rate: 129 bpm CRL:   7  mm   6 w 4 d                  [place] EDC: 02/21/2016 Subchorionic hemorrhage:  None visualized. Maternal uterus/adnexae: A second 5 mm cystic sac-like structure is seen along the superior aspect of the living IUP. This is suspicious for a failed/vanishing twin gestation. Both ovaries are normal in appearance. No adnexal mass or free fluid identified. IMPRESSION: Single living IUP measuring 6 weeks 4 days with [place] EDC of 02/21/2016. This is concordant with LMP. 5 mm sac-like structure along superior aspect of the living IUP, suspicious for failed/vanishing twin gestation. Electronically Signed   By: Earle Gell M.D.   On: 07/02/2015 11:03   US Ob Transvaginal  06/23/2015  CLINICAL DATA:  Cramping. 5 weeks 4 days pregnant by last menstrual. EXAM: TRANSVAGINAL OB ULTRASOUND TECHNIQUE:  Transvaginal ultrasound was performed for complete evaluation of the gestation as well as the maternal uterus, adnexal regions, and pelvic cul-de-sac. COMPARISON:  06/16/2015 FINDINGS: Intrauterine gestational sac: Present Yolk sac:  Present Embryo:  Absent MSD: 8  mm   5 w   4  d Subchorionic hemorrhage:  Absent Maternal uterus/adnexae: Bilateral probable corpus luteal cysts identified. On the right, 1.6 cm. On the left, 1.8 cm. IMPRESSION: 1. Presence of a gestational sac and yolk sac. Absent embryo, likely due to early gestational age. Based on mean sac diameter of 8 mm, gestational age of [redacted] weeks 4 days. 2. Probable bilateral corpus luteal cysts. Electronically Signed   By: Abigail Miyamoto M.D.   On: 06/23/2015 20:11   US Ob Transvaginal  06/16/2015  CLINICAL DATA:  Pelvic pain for 3 days, progressing. Positive pregnancy test EXAM: OBSTETRIC <14 WK [place] AND TRANSVAGINAL OB US TECHNIQUE: Both transabdominal and transvaginal ultrasound examinations were performed for complete evaluation of the gestation as well as the maternal uterus, adnexal regions, and pelvic cul-de-sac. Transvaginal technique was performed to assess early pregnancy. COMPARISON:  None. FINDINGS: Intrauterine gestational sac: Not visualized Yolk sac:  Not visualized Embryo:  Not  visualized Cardiac Activity: Not visualized Subchorionic hemorrhage:  None visualized. Maternal uterus/adnexae: The uterus measures 8.7 x 5.0 x 6.2 cm. There is no intrauterine mass. The endometrium measures 13 mm with a smooth contour. The right ovary measures 3.5 x 2.1 x 2.7 cm. Left ovary measures 3.7 x 2.0 x 2.0 cm. Within the right ovary, there is a mildly complex predominantly cystic structure measuring 1.7 x 1.1 x 1.3 cm, a likely mildly hemorrhagic corpus luteum. There is no other extrauterine pelvic or adnexal mass. There is trace free pelvic fluid. IMPRESSION: No demonstrable intrauterine mass or gestation. Suspect hemorrhagic corpus luteum in the right ovary.  Trace free pelvic fluid may be physiologic. Given these findings and positive pregnancy test, differential considerations include intrauterine gestation too early to be seen by either transabdominal or transvaginal technique; recent spontaneous abortion; ectopic gestation. Given this circumstance, close clinical and laboratory surveillance advised. Timing of repeat sonographic examination will in part depend on beta HCG findings going forward. Electronically Signed   By: Lowella Grip III M.D.   On: 06/16/2015 13:09    ASSESSMENT 1. Supervision of normal pregnancy, antepartum, first trimester     PLAN Discharge home in stable condition Patient advised to start taking prenatal vitamins RX Concept DHA (prenatal vitamins) Pregnancy confirmation letter given Patient advised to start prenatal care with [facility] provider of choice as soon as possible  [name] Pounds, CNM  07/02/2015  11:53 AM

## 2015-07-17 ENCOUNTER — Encounter (HOSPITAL_COMMUNITY): Payer: Self-pay

## 2015-07-17 ENCOUNTER — Inpatient Hospital Stay (HOSPITAL_COMMUNITY)
Admission: AD | Admit: 2015-07-17 | Discharge: 2015-07-17 | Disposition: A | Discharge status: Home / Self Care | Payer: Medicaid Other | Source: Ambulatory Visit | Attending: Obstetrics & Gynecology | Admitting: Obstetrics & Gynecology

## 2015-07-17 DIAGNOSIS — R300 Dysuria: Secondary | ICD-10-CM | POA: Diagnosis present

## 2015-07-17 DIAGNOSIS — O23591 Infection of other part of genital tract in pregnancy, first trimester: Secondary | ICD-10-CM | POA: Insufficient documentation

## 2015-07-17 DIAGNOSIS — O2341 Unspecified infection of urinary tract in pregnancy, first trimester: Secondary | ICD-10-CM | POA: Diagnosis not present

## 2015-07-17 DIAGNOSIS — N760 Acute vaginitis: Secondary | ICD-10-CM

## 2015-07-17 DIAGNOSIS — Z3A08 8 weeks gestation of pregnancy: Secondary | ICD-10-CM | POA: Insufficient documentation

## 2015-07-17 DIAGNOSIS — F418 Other specified anxiety disorders: Secondary | ICD-10-CM | POA: Insufficient documentation

## 2015-07-17 DIAGNOSIS — B9689 Other specified bacterial agents as the cause of diseases classified elsewhere: Secondary | ICD-10-CM | POA: Diagnosis not present

## 2015-07-17 DIAGNOSIS — O99341 Other mental disorders complicating pregnancy, first trimester: Secondary | ICD-10-CM | POA: Diagnosis not present

## 2015-07-17 LAB — URINALYSIS, ROUTINE W REFLEX MICROSCOPIC
BILIRUBIN URINE: NEGATIVE
GLUCOSE, UA: NEGATIVE mg/dL
KETONES UR: NEGATIVE mg/dL
Nitrite: NEGATIVE
PH: 6.5 (ref 5.0–8.0)
Protein, ur: 30 mg/dL — AB
Specific Gravity, Urine: 1.015 (ref 1.005–1.030)

## 2015-07-17 LAB — WET PREP, GENITAL
SPERM: NONE SEEN
TRICH WET PREP: NONE SEEN
YEAST WET PREP: NONE SEEN

## 2015-07-17 LAB — URINE MICROSCOPIC-ADD ON

## 2015-07-17 MED ORDER — CEPHALEXIN 500 MG PO CAPS: 500.0000 mg | ORAL_CAPSULE | Freq: Four times a day (QID) | ORAL | Status: DC

## 2015-07-17 MED ORDER — METRONIDAZOLE 500 MG PO TABS: 500.0000 mg | ORAL_TABLET | Freq: Two times a day (BID) | ORAL | Status: DC

## 2015-07-17 NOTE — MAU Note (Signed)
Pt thinks she may have UTI or BV, states she has burning with urination x 3 days.  Also has some abd pain with urination.  Denies bleeding.

## 2015-07-17 NOTE — MAU Provider Note (Signed)
History     CSN: [id number]  Arrival date and time: 07/17/15 1441   First Provider Initiated Contact with Patient 07/17/15 1547      Chief Complaint  Patient presents with  . Dysuria  . Abdominal Pain   HPI  Katrina Stewart is a 20 y.o. G1P0 at [redacted]w[redacted]d who presents with dysuria & vaginal discharge. Symptoms began 3 days ago. Reports increased frequency & suprapubic pain with urination. Thick, white vaginal discharge. Denies itching or irritation.  Denies vaginal bleeding, hematuria, abdominal pain, n/v, flank pain, or fever.  Has not started prenatal care.   OB History    Gravida Para Term Preterm AB TAB SAB Ectopic Multiple Living   1               Past Medical History  Diagnosis Date  . Depression   . Anxiety     Past Surgical History  Procedure Laterality Date  . Mass excision  10/29/2011    Procedure: EXCISION MASS;  Surgeon: Harl Bowie, MD;  Location: [facility];  Service: General;  Laterality: Bilateral;  excision of bilateral breast masses  . Breast surgery      Family History  Problem Relation Age of Onset  . Cancer Maternal Grandmother     lung cancer  . Cancer Other     bladder cancer    Social History  Substance Use Topics  . Smoking status: Never Smoker   . Smokeless tobacco: None  . Alcohol Use: 1.2 - 1.8 oz/week    2-3 Shots of liquor per week    Allergies: No Known Allergies  Prescriptions prior to admission  Medication Sig Dispense Refill Last Dose  . acetaminophen (TYLENOL) 500 MG tablet Take 1,000 mg by mouth every 6 (six) hours as needed for moderate pain.    06/23/2015 at Unknown time  . FLUoxetine (PROZAC) 40 MG capsule Take 40 mg by mouth daily.   Past Week at Unknown time  . Prenat-FeFum-FePo-FA-Omega 3 (CONCEPT DHA) 53.5-38-1 MG CAPS Take 1 tablet by mouth daily. 30 capsule 2   . traZODone (DESYREL) 100 MG tablet Take 1 tablet (100 mg total) by mouth at bedtime. (Patient taking differently: Take 100 mg by mouth at bedtime as needed  for sleep. ) 30 tablet 0 Past Month at Unknown time    Review of Systems  Constitutional: Negative.   Gastrointestinal: Negative.   Genitourinary: Positive for dysuria, urgency and frequency. Negative for hematuria and flank pain.       + vaginal discharge No vaginal bleeding   Physical Exam   Blood pressure 116/72, pulse 83, temperature 98.1 F (36.7 C), temperature source Oral, resp. rate 16, height 5\' 8"  (1.727 m), weight 215 lb (97.523 kg), last menstrual period 05/15/2015.  Physical Exam  Nursing note and vitals reviewed. Constitutional: She is oriented to person, place, and time. She appears well-developed and well-nourished. No distress.  HENT:  Head: Normocephalic and atraumatic.  Eyes: Conjunctivae are normal. Right eye exhibits no discharge. Left eye exhibits no discharge. No scleral icterus.  Neck: Normal range of motion.  Cardiovascular: Normal rate, regular rhythm and normal heart sounds.   No murmur heard. Respiratory: Effort normal and breath sounds normal. No respiratory distress. She has no wheezes.  GI: Soft. Bowel sounds are normal. She exhibits no distension. There is no tenderness. There is no rebound.  Neurological: She is alert and oriented to person, place, and time.  Skin: Skin is warm and dry. She is not diaphoretic.  Psychiatric: She has a normal mood and affect. Her behavior is normal. Judgment and thought content normal.    MAU Course  Procedures Results for orders placed or performed during the hospital encounter of 07/17/15 (from the past 24 hour(s))  Urinalysis, Routine w reflex microscopic (not at [facility])     Status: Abnormal   Collection Time: 07/17/15  2:50 PM  Result Value Ref Range   Color, Urine YELLOW YELLOW   APPearance CLOUDY (A) CLEAR   Specific Gravity, Urine 1.015 1.005 - 1.030   pH 6.5 5.0 - 8.0   Glucose, UA NEGATIVE NEGATIVE mg/dL   Hgb urine dipstick MODERATE (A) NEGATIVE   Bilirubin Urine NEGATIVE NEGATIVE   Ketones, ur  NEGATIVE NEGATIVE mg/dL   Protein, ur 30 (A) NEGATIVE mg/dL   Nitrite NEGATIVE NEGATIVE   Leukocytes, UA LARGE (A) NEGATIVE  Urine microscopic-add on     Status: Abnormal   Collection Time: 07/17/15  2:50 PM  Result Value Ref Range   Squamous Epithelial / LPF 0-5 (A) NONE SEEN   WBC, UA TOO NUMEROUS TO COUNT 0 - 5 WBC/hpf   RBC / HPF 6-30 0 - 5 RBC/hpf   Bacteria, UA MANY (A) NONE SEEN   Urine-Other MUCOUS PRESENT   Wet prep, genital     Status: Abnormal   Collection Time: 07/17/15  3:57 PM  Result Value Ref Range   Yeast Wet Prep HPF POC NONE SEEN NONE SEEN   Trich, Wet Prep NONE SEEN NONE SEEN   Clue Cells Wet Prep HPF POC PRESENT (A) NONE SEEN   WBC, Wet Prep HPF POC FEW (A) NONE SEEN   Sperm NONE SEEN     MDM VSS Wet prep Will tx for uti based on u/a & symptoms -- urine culture sent -- no evidence of pyelo  Assessment and Plan  A:  1. BV (bacterial vaginosis)   2. UTI in pregnancy, antepartum, first trimester     P: Discharge home Rx Keflex & flagyl Urine culture pending Provider list given -- start prenatal care Discussed reasons to return to [place] 07/17/2015, 3:47 PM

## 2015-07-17 NOTE — Discharge Instructions (Signed)
Bacterial Vaginosis Bacterial vaginosis is an infection of the vagina. It happens when too many germs (bacteria) grow in the vagina. Having this infection puts you at risk for getting other infections from sex. Treating this infection can help lower your risk for other infections, such as:   Chlamydia.  Gonorrhea.  HIV.  Herpes. HOME CARE  Take your medicine as told by your doctor.  Finish your medicine even if you start to feel better.  During treatment:  Avoid sex or use condoms correctly.  Do not douche.  Do not drink alcohol unless your doctor tells you it is ok.  Do not breastfeed unless your doctor tells you it is ok. GET HELP IF:  You are not getting better after 3 days of treatment.  You have more grey fluid (discharge) coming from your vagina than before.  You have more pain than before.  You have a fever. MAKE SURE YOU:   Understand these instructions.  Will watch your condition.  Will get help right away if you are not doing well or get worse.   This information is not intended to replace advice given to you by your health care provider. Make sure you discuss any questions you have with your health care provider.   Document Released: 10/28/2007 Document Revised: 02/08/2014 Document Reviewed: 08/30/2012 Elsevier Interactive Patient Education 2016 [place]. Pregnancy and Urinary Tract Infection A urinary tract infection (UTI) is a bacterial infection of the urinary tract. Infection of the urinary tract can include the ureters, kidneys (pyelonephritis), bladder (cystitis), and urethra (urethritis). All pregnant women should be screened for bacteria in the urinary tract. Identifying and treating a UTI will decrease the risk of preterm labor and developing more serious infections in both the mother and baby. CAUSES Bacteria germs cause almost all UTIs.  RISK FACTORS Many factors can increase your chances of getting a UTI during pregnancy. These  include:  Having a short urethra.  Poor toilet and hygiene habits.  Sexual intercourse.  Blockage of urine along the urinary tract.  Problems with the pelvic muscles or nerves.  Diabetes.  Obesity.  Bladder problems after having several children.  Previous history of UTI. SIGNS AND SYMPTOMS   Pain, burning, or a stinging feeling when urinating.  Suddenly feeling the need to urinate right away (urgency).  Loss of bladder control (urinary incontinence).  Frequent urination, more than is common with pregnancy.  Lower abdominal or back discomfort.  Cloudy urine.  Blood in the urine (hematuria).  Fever. When the kidneys are infected, the symptoms may be:  Back pain.  Flank pain on the right side more so than the left.  Fever.  Chills.  Nausea.  Vomiting. DIAGNOSIS  A urinary tract infection is usually diagnosed through urine tests. Additional tests and procedures are sometimes done. These may include:  Ultrasound exam of the kidneys, ureters, bladder, and urethra.  Looking in the bladder with a lighted tube (cystoscopy). TREATMENT Typically, UTIs can be treated with antibiotic medicines.  HOME CARE INSTRUCTIONS   Only take over-the-counter or prescription medicines as directed by your health care provider. If you were prescribed antibiotics, take them as directed. Finish them even if you start to feel better.  Drink enough fluids to keep your urine clear or pale yellow.  Do not have sexual intercourse until the infection is gone and your health care provider says it is okay.  Make sure you are tested for UTIs throughout your pregnancy. These infections often come back. Preventing a UTI  in the Future  Practice good toilet habits. Always wipe from front to back. Use the tissue only once.  Do not hold your urine. Empty your bladder as soon as possible when the urge comes.  Do not douche or use deodorant sprays.  Wash with soap and warm water around  the genital area and the anus.  Empty your bladder before and after sexual intercourse.  Wear underwear with a cotton crotch.  Avoid caffeine and carbonated drinks. They can irritate the bladder.  Drink cranberry juice or take cranberry pills. This may decrease the risk of getting a UTI.  Do not drink alcohol.  Keep all your appointments and tests as scheduled. SEEK MEDICAL CARE IF:   Your symptoms get worse.  You are still having fevers 2 or more days after treatment begins.  You have a rash.  You feel that you are having problems with medicines prescribed.  You have abnormal vaginal discharge. SEEK IMMEDIATE MEDICAL CARE IF:   You have back or flank pain.  You have chills.  You have blood in your urine.  You have nausea and vomiting.  You have contractions of your uterus.  You have a gush of fluid from the vagina. MAKE SURE YOU:  Understand these instructions.   Will watch your condition.   Will get help right away if you are not doing well or get worse.    This information is not intended to replace advice given to you by your health care provider. Make sure you discuss any questions you have with your health care provider.   Document Released: 05/15/2010 Document Revised: 11/08/2012 Document Reviewed: 08/17/2012 Elsevier Interactive Patient Education 2016 [facility] Providers   Gracy Racer      Phone: [phone]  [facility]     Phone: [phone]  [facility] [place] [facility] [place]  Phone: [phone]  [facility] [place] [facility] [place]  Phone: [facility]    Phone: [phone]   [facility] [place])    Phone: [facility]    Phone: [phone]  [facility]    Phone: [phone]  [place]    Phone: [phone]  [place]  Department-Family Planning Phone: [phone]   [place] [facility]-M[facility]  Phone: [place]    Phone: [phone]  [facility]   Phone: [phone]  [facility]      Phone: [phone]  [facility]    Phone: [phone]  [facility]     Phone: [phone]

## 2015-07-19 LAB — CULTURE, OB URINE

## 2015-07-20 ENCOUNTER — Encounter: Payer: Self-pay | Admitting: Gynecology

## 2015-07-20 ENCOUNTER — Telehealth: Payer: Self-pay | Admitting: Gynecology

## 2015-07-20 DIAGNOSIS — N390 Urinary tract infection, site not specified: Secondary | ICD-10-CM

## 2015-07-20 MED ORDER — CLINDAMYCIN HCL 300 MG PO CAPS: 300.0000 mg | ORAL_CAPSULE | Freq: Three times a day (TID) | ORAL | Status: DC

## 2015-08-22 ENCOUNTER — Encounter (HOSPITAL_COMMUNITY): Payer: Self-pay | Admitting: *Deleted

## 2015-08-22 ENCOUNTER — Inpatient Hospital Stay (HOSPITAL_COMMUNITY)
Admission: AD | Admit: 2015-08-22 | Discharge: 2015-08-22 | Disposition: A | Discharge status: Home / Self Care | Payer: Medicaid Other | Source: Ambulatory Visit | Attending: Obstetrics & Gynecology | Admitting: Obstetrics & Gynecology

## 2015-08-22 DIAGNOSIS — O99311 Alcohol use complicating pregnancy, first trimester: Secondary | ICD-10-CM | POA: Diagnosis not present

## 2015-08-22 DIAGNOSIS — F329 Major depressive disorder, single episode, unspecified: Secondary | ICD-10-CM | POA: Insufficient documentation

## 2015-08-22 DIAGNOSIS — F419 Anxiety disorder, unspecified: Secondary | ICD-10-CM | POA: Insufficient documentation

## 2015-08-22 DIAGNOSIS — Z3A13 13 weeks gestation of pregnancy: Secondary | ICD-10-CM | POA: Insufficient documentation

## 2015-08-22 DIAGNOSIS — O219 Vomiting of pregnancy, unspecified: Secondary | ICD-10-CM | POA: Diagnosis not present

## 2015-08-22 DIAGNOSIS — F332 Major depressive disorder, recurrent severe without psychotic features: Secondary | ICD-10-CM

## 2015-08-22 DIAGNOSIS — O99341 Other mental disorders complicating pregnancy, first trimester: Secondary | ICD-10-CM | POA: Diagnosis not present

## 2015-08-22 DIAGNOSIS — O26891 Other specified pregnancy related conditions, first trimester: Secondary | ICD-10-CM | POA: Insufficient documentation

## 2015-08-22 DIAGNOSIS — R51 Headache: Secondary | ICD-10-CM | POA: Diagnosis not present

## 2015-08-22 DIAGNOSIS — O26892 Other specified pregnancy related conditions, second trimester: Secondary | ICD-10-CM | POA: Insufficient documentation

## 2015-08-22 LAB — URINALYSIS, ROUTINE W REFLEX MICROSCOPIC
Bilirubin Urine: NEGATIVE
GLUCOSE, UA: NEGATIVE mg/dL
Ketones, ur: 15 mg/dL — AB
LEUKOCYTES UA: NEGATIVE
Nitrite: NEGATIVE
PH: 6.5 (ref 5.0–8.0)
Protein, ur: NEGATIVE mg/dL
Specific Gravity, Urine: 1.02 (ref 1.005–1.030)

## 2015-08-22 LAB — URINE MICROSCOPIC-ADD ON: Bacteria, UA: NONE SEEN

## 2015-08-22 MED ORDER — PROMETHAZINE HCL 25 MG/ML IJ SOLN
25.0000 mg | Freq: Once | INTRAMUSCULAR | Status: AC
  Administered 2015-08-22: 25 mg via INTRAMUSCULAR
  Filled 2015-08-22: qty 1

## 2015-08-22 MED ORDER — PROMETHAZINE HCL 12.5 MG PO TABS: 12.5000 mg | ORAL_TABLET | Freq: Every evening | ORAL | Status: DC | PRN

## 2015-08-22 MED ORDER — FLUOXETINE HCL 20 MG PO CAPS: 20.0000 mg | ORAL_CAPSULE | Freq: Every day | ORAL | Status: DC

## 2015-08-22 MED ORDER — METOCLOPRAMIDE HCL 10 MG PO TABS: 10.0000 mg | ORAL_TABLET | Freq: Three times a day (TID) | ORAL | Status: DC

## 2015-08-22 NOTE — Discharge Instructions (Signed)
Morning Sickness Morning sickness is when you feel sick to your stomach (nauseous) during pregnancy. You may feel sick to your stomach and throw up (vomit). You may feel sick in the morning, but you can feel this way any time of day. Some women feel very sick to their stomach and cannot stop throwing up (hyperemesis gravidarum). HOME CARE  Only take medicines as told by your doctor.  Take multivitamins as told by your doctor. Taking multivitamins before getting pregnant can stop or lessen the harshness of morning sickness.  Eat dry toast or unsalted crackers before getting out of bed.  Eat 5 to 6 small meals a day.  Eat dry and bland foods like rice and baked potatoes.  Do not drink liquids with meals. Drink between meals.  Do not eat greasy, fatty, or spicy foods.  Have someone cook for you if the smell of food causes you to feel sick or throw up.  If you feel sick to your stomach after taking prenatal vitamins, take them at night or with a snack.  Eat protein when you need a snack (nuts, yogurt, cheese).  Eat unsweetened gelatins for dessert.  Wear a bracelet used for sea sickness (acupressure wristband).  Go to a doctor that puts thin needles into certain body points (acupuncture) to improve how you feel.  Do not smoke.  Use a humidifier to keep the air in your house free of odors.  Get lots of fresh air. GET HELP IF:  You need medicine to feel better.  You feel dizzy or lightheaded.  You are losing weight. GET HELP RIGHT AWAY IF:   You feel very sick to your stomach and cannot stop throwing up.  You pass out (faint). MAKE SURE YOU:  Understand these instructions.  Will watch your condition.  Will get help right away if you are not doing well or get worse.   This information is not intended to replace advice given to you by your health care provider. Make sure you discuss any questions you have with your health care provider.   Document Released: 02/26/2004  Document Revised: 02/08/2014 Document Reviewed: 07/05/2012 Elsevier Interactive Patient Education 2016 Elsevier [employer]  Hyperemesis Gravidarum Hyperemesis gravidarum is a severe form of nausea and vomiting that happens during pregnancy. Hyperemesis is worse than morning sickness. It may cause you to have nausea or vomiting all day for many days. It may keep you from eating and drinking enough food and liquids. Hyperemesis usually occurs during the first half (the first 20 weeks) of pregnancy. It often goes away once a woman is in her second half of pregnancy. However, sometimes hyperemesis continues through an entire pregnancy.  CAUSES  The cause of this condition is not completely known but is thought to be related to changes in the body's hormones when pregnant. It could be from the high level of the pregnancy hormone or an increase in estrogen in the body.  SIGNS AND SYMPTOMS   Severe nausea and vomiting.  Nausea that does not go away.  Vomiting that does not allow you to keep any food down.  Weight loss and body fluid loss (dehydration).  Having no desire to eat or not liking food you have previously enjoyed. DIAGNOSIS  Your health care provider will do a physical exam and ask you about your symptoms. He or she may also order blood tests and urine tests to make sure something else is not causing the problem.  TREATMENT  You may only need medicine to  control the problem. If medicines do not control the nausea and vomiting, you will be treated in the hospital to prevent dehydration, increased acid in the blood (acidosis), weight loss, and changes in the electrolytes in your body that may harm the unborn baby (fetus). You may need IV fluids.  HOME CARE INSTRUCTIONS   Only take over-the-counter or prescription medicines as directed by your health care provider.  Try eating a couple of dry crackers or toast in the morning before getting out of bed.  Avoid foods and smells that upset your  stomach.  Avoid fatty and spicy foods.  Eat 5-6 small meals a day.  Do not drink when eating meals. Drink between meals.  For snacks, eat high-protein foods, such as cheese.  Eat or suck on things that have ginger in them. Ginger helps nausea.  Avoid food preparation. The smell of food can spoil your appetite.  Avoid iron pills and iron in your multivitamins until after 3-4 months of being pregnant. However, consult with your health care provider before stopping any prescribed iron pills. SEEK MEDICAL CARE IF:   Your abdominal pain increases.  You have a severe headache.  You have vision problems.  You are losing weight. SEEK IMMEDIATE MEDICAL CARE IF:   You are unable to keep fluids down.  You vomit blood.  You have constant nausea and vomiting.  You have excessive weakness.  You have extreme thirst.  You have dizziness or fainting.  You have a fever or persistent symptoms for more than 2-3 days.  You have a fever and your symptoms suddenly get worse. MAKE SURE YOU:   Understand these instructions.  Will watch your condition.  Will get help right away if you are not doing well or get worse.   This information is not intended to replace advice given to you by your health care provider. Make sure you discuss any questions you have with your health care provider.   Document Released: 01/18/2005 Document Revised: 11/08/2012 Document Reviewed: 08/30/2012 Elsevier Interactive Patient Education [employer].

## 2015-08-22 NOTE — MAU Provider Note (Signed)
History     CSN: [id number]  Arrival date and time: 08/22/15 1629   First Provider Initiated Contact with Patient 08/22/15 1703      Chief Complaint  Patient presents with  . Emesis During Pregnancy  . Headache   HPI   Ms.Katrina Stewart is a 20 y.o. female G1P0 @ [redacted]w[redacted]d here with N/V.  She has vomited 6-7 times in the last 24 hours.  She has not taken anything for the nausea; she has nothing at home for the nausea. She is interested in having something for the nausea. She is also concerned because she has been on prozac for a depressive disorder and stopped this medication when she found out she was pregnant. She has been feeling depressed again since she stopped the prozac. She denies thoughts of harm to herself or anyone else. She does have a history of cutting.   OB History    Gravida Para Term Preterm AB TAB SAB Ectopic Multiple Living   1               Past Medical History  Diagnosis Date  . Depression   . Anxiety     Past Surgical History  Procedure Laterality Date  . Mass excision  10/29/2011    Procedure: EXCISION MASS;  Surgeon: Harl Bowie, MD;  Location: [facility];  Service: General;  Laterality: Bilateral;  excision of bilateral breast masses  . Breast surgery      Family History  Problem Relation Age of Onset  . Cancer Maternal Grandmother     lung cancer  . Cancer Other     bladder cancer    Social History  Substance Use Topics  . Smoking status: Never Smoker   . Smokeless tobacco: None  . Alcohol Use: 1.2 - 1.8 oz/week    2-3 Shots of liquor per week     Comment: none since pregnancy    Allergies: No Known Allergies  Prescriptions prior to admission  Medication Sig Dispense Refill Last Dose  . cephALEXin (KEFLEX) 500 MG capsule Take 1 capsule (500 mg total) by mouth 4 (four) times daily. 28 capsule 0   . clindamycin (CLEOCIN) 300 MG capsule Take 1 capsule (300 mg total) by mouth 3 (three) times daily. 30 capsule 0   . FLUoxetine (PROZAC)  40 MG capsule Take 40 mg by mouth daily.   Past Month at Unknown time  . metroNIDAZOLE (FLAGYL) 500 MG tablet Take 1 tablet (500 mg total) by mouth 2 (two) times daily. 14 tablet 0   . Prenat-FeFum-FePo-FA-Omega 3 (CONCEPT DHA) 53.5-38-1 MG CAPS Take 1 tablet by mouth daily. 30 capsule 2 07/16/2015 at Unknown time  . traZODone (DESYREL) 100 MG tablet Take 1 tablet (100 mg total) by mouth at bedtime. (Patient taking differently: Take 100 mg by mouth at bedtime as needed. ) 30 tablet 0 Past Week at Unknown time   Results for orders placed or performed during the hospital encounter of 08/22/15 (from the past 48 hour(s))  Urinalysis, Routine w reflex microscopic (not at [facility])     Status: Abnormal   Collection Time: 08/22/15  4:40 PM  Result Value Ref Range   Color, Urine YELLOW YELLOW   APPearance CLEAR CLEAR   Specific Gravity, Urine 1.020 1.005 - 1.030   pH 6.5 5.0 - 8.0   Glucose, UA NEGATIVE NEGATIVE mg/dL   Hgb urine dipstick SMALL (A) NEGATIVE   Bilirubin Urine NEGATIVE NEGATIVE   Ketones, ur 15 (A) NEGATIVE mg/dL  Protein, ur NEGATIVE NEGATIVE mg/dL   Nitrite NEGATIVE NEGATIVE   Leukocytes, UA NEGATIVE NEGATIVE  Urine microscopic-add on     Status: Abnormal   Collection Time: 08/22/15  4:40 PM  Result Value Ref Range   Squamous Epithelial / LPF 0-5 (A) NONE SEEN   WBC, UA 0-5 0 - 5 WBC/hpf   RBC / HPF 0-5 0 - 5 RBC/hpf   Bacteria, UA NONE SEEN NONE SEEN   Urine-Other AMORPHOUS URATES/PHOSPHATES     Review of Systems  Constitutional: Negative for fever.  Gastrointestinal: Negative for diarrhea.  Genitourinary: Negative for dysuria.  Psychiatric/Behavioral: Positive for depression. Negative for suicidal ideas, hallucinations, memory loss and substance abuse. The patient is nervous/anxious and has insomnia.    Physical Exam   Blood pressure 132/68, pulse 89, temperature 98.4 F (36.9 C), temperature source Oral, resp. rate 18, height 5\' 8"  (1.727 m), weight 213 lb (96.616 kg),  last menstrual period 05/15/2015.  Physical Exam  Constitutional: She is oriented to person, place, and time. She appears well-developed and well-nourished. No distress.  HENT:  Head: Normocephalic.  Eyes: Pupils are equal, round, and reactive to light.  Musculoskeletal: Normal range of motion.  Neurological: She is alert and oriented to person, place, and time.  Skin: Skin is warm. She is not diaphoretic.  Psychiatric: Her behavior is normal.    MAU Course  Procedures  None   MDM + fetal heart tones via doppler  UA  Phenergan 25 mg IM   Assessment and Plan   A:  1. Nausea and vomiting in pregnancy prior to [redacted] weeks gestation   2. Severe episode of recurrent major depressive disorder, without psychotic features ([place])     P:  Discharge home in stable condition  Rx: Reglan, Prozac, phenergan PRN @ night Return to MAU if symptoms worsen Follow up with PCP regarding depression symptoms Keep appointment with the [facility], frequent meals.    Lezlie Lye, NP 08/22/2015 9:56 PM

## 2015-08-22 NOTE — MAU Note (Signed)
Onset of headache and vomiting since last night, has had vomiting during pregnancy but this is worse, can't keep anything down even water, stomach pain, no vaginal bleeding.

## 2015-08-27 ENCOUNTER — Encounter: Payer: Self-pay | Admitting: Advanced Practice Midwife

## 2015-08-27 ENCOUNTER — Ambulatory Visit: Payer: Self-pay | Admitting: Clinical

## 2015-08-27 ENCOUNTER — Ambulatory Visit (INDEPENDENT_AMBULATORY_CARE_PROVIDER_SITE_OTHER): Payer: Medicaid Other | Admitting: Advanced Practice Midwife

## 2015-08-27 VITALS — BP 130/72 | HR 86 | Wt 215.9 lb

## 2015-08-27 DIAGNOSIS — Z3482 Encounter for supervision of other normal pregnancy, second trimester: Secondary | ICD-10-CM

## 2015-08-27 DIAGNOSIS — Z3492 Encounter for supervision of normal pregnancy, unspecified, second trimester: Secondary | ICD-10-CM

## 2015-08-27 DIAGNOSIS — F3341 Major depressive disorder, recurrent, in partial remission: Secondary | ICD-10-CM

## 2015-08-27 LAB — POCT URINALYSIS DIP (DEVICE)
BILIRUBIN URINE: NEGATIVE
Glucose, UA: NEGATIVE mg/dL
Ketones, ur: NEGATIVE mg/dL
LEUKOCYTES UA: NEGATIVE
Nitrite: NEGATIVE
Protein, ur: NEGATIVE mg/dL
UROBILINOGEN UA: 0.2 mg/dL (ref 0.0–1.0)
pH: 5.5 (ref 5.0–8.0)

## 2015-08-27 NOTE — Patient Instructions (Signed)

## 2015-08-27 NOTE — Progress Notes (Signed)
  ASSESSMENT: Pt currently experiencing Major depressive disorder, in partial remission. Pt needs to cont with PCP, f/u with OB. Pt would benefit from f/u with [facility] in third trimester for postpartum safety planning.  Stage of Change: contemplative  PLAN: 1. F/U with behavioral health clinician in 3 months, or as needed, for postpartum safety planning 2. Psychiatric Medications:  Prozac, started back one week prior 3. Behavioral recommendations:   -Continue to see PCP for [facility] med management -Continue to attend individual therapy sessions   SUBJECTIVE: Pt. referred by  Hansel Feinstein, CNM, for symptoms of depression and anxiety Pt. reports the following symptoms/concerns: Pt states that she is actively seeing a therapist, and is receiving [facility] med management via her PCP; pt says her symptoms began increasing when she stopped medication with pregnancy, and symptoms are decreasing in past week since starting again.  Duration of problem: One week decrease Severity: moderate   OBJECTIVE: Orientation & Cognition: Oriented x3. Thought processes normal and appropriate to situation. Mood: appropriate Affect: apprpriate Appearance: appropriate Risk of harm to self or others: low risk of harm to self today, no SI today; no known risk of harm to others Substance use: none  Assessments administered: PHQ9: 15/ GAD7: 13  Diagnosis: Major depressive disorder, in partial remission CPT Code: F33.41  -------------------------------------------- Other(s) present in the room: none  Time spent with patient in exam room: 12  minutes 11:15am-11:27am   Depression screen PHQ 2/9 08/27/2015  Decreased Interest 3  Down, Depressed, Hopeless 3  PHQ - 2 Score 6  Altered sleeping 2  Tired, decreased energy 2  Change in appetite 2  Feeling bad or failure about yourself  2  Trouble concentrating 0  Moving slowly or fidgety/restless 0  Suicidal thoughts 0  PHQ-9 Score 14   GAD 7 : Generalized Anxiety Score  08/27/2015  Nervous, Anxious, on Edge 3  Control/stop worrying 3  Worry too much - different things 2  Trouble relaxing 0  Restless 1  Easily annoyed or irritable 3  Afraid - awful might happen 1  Total GAD 7 Score 13

## 2015-08-27 NOTE — Progress Notes (Signed)
Here for intial prenatal visit. Given new patient education booklet.

## 2015-08-28 LAB — HEPATITIS B SURFACE ANTIGEN: HEP B S AG: NEGATIVE

## 2015-08-28 LAB — ABO AND RH: RH TYPE: NEGATIVE

## 2015-08-28 LAB — RUBELLA SCREEN: Rubella: <0.9 Index (ref <0.90)

## 2015-08-28 LAB — GLUCOSE TOLERANCE, 1 HOUR (50G) W/O FASTING: Glucose, 1 Hr, gestational: 141 mg/dL — ABNORMAL HIGH (ref <140)

## 2015-08-30 LAB — PAIN MGMT, PROFILE 6 CONF W/O MM, U
6 Acetylmorphine: NEGATIVE ng/mL (ref <10)
ALCOHOL METABOLITES: NEGATIVE ng/mL (ref <500)
Amphetamines: NEGATIVE ng/mL (ref <500)
BARBITURATES: NEGATIVE ng/mL (ref <300)
BENZODIAZEPINES: NEGATIVE ng/mL (ref <100)
CREATININE: 261 mg/dL (ref >=20.0)
Cocaine Metabolite: NEGATIVE ng/mL (ref <150)
MARIJUANA METABOLITE: NEGATIVE ng/mL (ref <20)
METHADONE METABOLITE: NEGATIVE ng/mL (ref <100)
OPIATES: NEGATIVE ng/mL (ref <100)
OXIDANT: NEGATIVE ug/mL (ref <200)
Oxycodone: NEGATIVE ng/mL (ref <100)
PHENCYCLIDINE: NEGATIVE ng/mL (ref <25)
Please note:: 0
pH: 6.07 (ref 4.5–9.0)

## 2015-08-31 ENCOUNTER — Encounter: Payer: Self-pay | Admitting: Advanced Practice Midwife

## 2015-08-31 DIAGNOSIS — Z3490 Encounter for supervision of normal pregnancy, unspecified, unspecified trimester: Secondary | ICD-10-CM | POA: Insufficient documentation

## 2015-08-31 NOTE — Progress Notes (Signed)
Subjective:    Katrina Stewart is being seen today for her first obstetrical visit.  This is not a planned pregnancy. She is at [redacted]w[redacted]d gestation. Her obstetrical history is significant for Late to care and history of Depression. Relationship with FOB: Unknown. Patient does not intend to breast feed. Pregnancy history fully reviewed.  Menstrual History: OB History    Gravida Para Term Preterm AB Living   2             SAB TAB Ectopic Multiple Live Births                   Patient's last menstrual period was 05/15/2015 (approximate). Period Cycle (Days): 28 Period Pattern: Regular Menstrual Flow: Moderate Dysmenorrhea: (!) Moderate Dysmenorrhea Symptoms: Cramping  The following portions of the patient's history were reviewed and updated as appropriate: allergies, current medications, past family history, past medical history, past social history, past surgical history and problem list.  Review of Systems Pertinent items are noted in HPI.    Objective:    BP 130/72   Pulse 86   Wt 215 lb 14.4 oz (97.9 kg)   LMP 05/15/2015 (Approximate)   BMI 32.83 kg/m  General appearance: alert and cooperative  Neck supple, no masses Resp even and unlabored HR RRR Abdomen gravid in contour, size = dates Pelvic exam normal  Ext gen WNL Vagina pink  No lesions Cervix long and closed Ext WNL  Assessment:    Pregnancy at 14 and 4/7 weeks    Plan:    Initial labs drawn. Prenatal vitamins. Problem list reviewed and updated. AFP3 discussed: requested. Role of ultrasound in pregnancy discussed; fetal survey: requested. Amniocentesis discussed: not indicated. Follow up in 4 weeks. 50% of 30 min visit spent on counseling and coordination of care.   Seen by counselor today

## 2015-09-03 ENCOUNTER — Encounter (HOSPITAL_COMMUNITY): Payer: Self-pay | Admitting: Advanced Practice Midwife

## 2015-09-03 DIAGNOSIS — O26899 Other specified pregnancy related conditions, unspecified trimester: Secondary | ICD-10-CM

## 2015-09-03 DIAGNOSIS — Z6791 Unspecified blood type, Rh negative: Secondary | ICD-10-CM | POA: Insufficient documentation

## 2015-09-03 DIAGNOSIS — Z283 Underimmunization status: Secondary | ICD-10-CM | POA: Insufficient documentation

## 2015-09-03 DIAGNOSIS — O09899 Supervision of other high risk pregnancies, unspecified trimester: Secondary | ICD-10-CM

## 2015-09-03 DIAGNOSIS — O9989 Other specified diseases and conditions complicating pregnancy, childbirth and the puerperium: Secondary | ICD-10-CM

## 2015-09-03 DIAGNOSIS — Z2839 Other underimmunization status: Secondary | ICD-10-CM

## 2015-09-03 HISTORY — DX: Supervision of other high risk pregnancies, unspecified trimester: O09.899

## 2015-09-03 HISTORY — DX: Unspecified blood type, rh negative: Z67.91

## 2015-09-03 HISTORY — DX: Other underimmunization status: Z28.39

## 2015-09-03 HISTORY — DX: Other specified pregnancy related conditions, unspecified trimester: O26.899

## 2015-09-05 ENCOUNTER — Telehealth: Payer: Self-pay | Admitting: General Practice

## 2015-09-05 NOTE — Telephone Encounter (Signed)
Patient needs 3 hr gtt. Called patient and informed her of 1 hr gtt & explained 3 hr gtt to patient. Patient verbalized understanding & states she can come 8/7 @ 8am. Patient had no questions

## 2015-09-08 ENCOUNTER — Other Ambulatory Visit: Payer: Medicaid Other

## 2015-09-08 DIAGNOSIS — R7309 Other abnormal glucose: Secondary | ICD-10-CM

## 2015-09-09 LAB — GLUCOSE TOLERANCE, 3 HOURS
GLUCOSE 3 HOUR GTT: 77 mg/dL (ref <145)
Glucose Tolerance, 1 hour: 80 mg/dL (ref <190)
Glucose Tolerance, 2 hour: 102 mg/dL (ref <165)
Glucose Tolerance, Fasting: 76 mg/dL (ref 65–104)

## 2015-09-10 ENCOUNTER — Inpatient Hospital Stay (HOSPITAL_COMMUNITY)
Admission: AD | Admit: 2015-09-10 | Discharge: 2015-09-10 | Disposition: A | Discharge status: Home / Self Care | Payer: Medicaid Other | Source: Ambulatory Visit | Attending: Obstetrics and Gynecology | Admitting: Obstetrics and Gynecology

## 2015-09-10 ENCOUNTER — Encounter (HOSPITAL_COMMUNITY): Payer: Self-pay

## 2015-09-10 DIAGNOSIS — O209 Hemorrhage in early pregnancy, unspecified: Secondary | ICD-10-CM | POA: Diagnosis not present

## 2015-09-10 DIAGNOSIS — O26899 Other specified pregnancy related conditions, unspecified trimester: Secondary | ICD-10-CM

## 2015-09-10 DIAGNOSIS — Z3A22 22 weeks gestation of pregnancy: Secondary | ICD-10-CM | POA: Diagnosis not present

## 2015-09-10 DIAGNOSIS — O09899 Supervision of other high risk pregnancies, unspecified trimester: Secondary | ICD-10-CM

## 2015-09-10 DIAGNOSIS — Z3492 Encounter for supervision of normal pregnancy, unspecified, second trimester: Secondary | ICD-10-CM

## 2015-09-10 DIAGNOSIS — O219 Vomiting of pregnancy, unspecified: Secondary | ICD-10-CM | POA: Diagnosis not present

## 2015-09-10 DIAGNOSIS — Z79899 Other long term (current) drug therapy: Secondary | ICD-10-CM | POA: Diagnosis not present

## 2015-09-10 DIAGNOSIS — Z3A16 16 weeks gestation of pregnancy: Secondary | ICD-10-CM | POA: Diagnosis not present

## 2015-09-10 DIAGNOSIS — F329 Major depressive disorder, single episode, unspecified: Secondary | ICD-10-CM | POA: Insufficient documentation

## 2015-09-10 DIAGNOSIS — F419 Anxiety disorder, unspecified: Secondary | ICD-10-CM | POA: Insufficient documentation

## 2015-09-10 DIAGNOSIS — O360120 Maternal care for anti-D [Rh] antibodies, second trimester, not applicable or unspecified: Secondary | ICD-10-CM

## 2015-09-10 DIAGNOSIS — O26892 Other specified pregnancy related conditions, second trimester: Secondary | ICD-10-CM | POA: Diagnosis not present

## 2015-09-10 DIAGNOSIS — R109 Unspecified abdominal pain: Secondary | ICD-10-CM | POA: Insufficient documentation

## 2015-09-10 DIAGNOSIS — Z283 Underimmunization status: Secondary | ICD-10-CM

## 2015-09-10 DIAGNOSIS — O4692 Antepartum hemorrhage, unspecified, second trimester: Secondary | ICD-10-CM | POA: Diagnosis not present

## 2015-09-10 DIAGNOSIS — O99342 Other mental disorders complicating pregnancy, second trimester: Secondary | ICD-10-CM | POA: Diagnosis not present

## 2015-09-10 DIAGNOSIS — O9989 Other specified diseases and conditions complicating pregnancy, childbirth and the puerperium: Secondary | ICD-10-CM

## 2015-09-10 LAB — WET PREP, GENITAL
Clue Cells Wet Prep HPF POC: NONE SEEN
Sperm: NONE SEEN
Trich, Wet Prep: NONE SEEN
YEAST WET PREP: NONE SEEN

## 2015-09-10 LAB — URINE MICROSCOPIC-ADD ON: RBC / HPF: NONE SEEN RBC/hpf (ref 0–5)

## 2015-09-10 LAB — URINALYSIS, ROUTINE W REFLEX MICROSCOPIC
Bilirubin Urine: NEGATIVE
Glucose, UA: NEGATIVE mg/dL
Ketones, ur: NEGATIVE mg/dL
Nitrite: NEGATIVE
Protein, ur: NEGATIVE mg/dL
Specific Gravity, Urine: 1.02 (ref 1.005–1.030)
pH: 7 (ref 5.0–8.0)

## 2015-09-10 MED ORDER — ONDANSETRON 8 MG PO TBDP
8.0000 mg | ORAL_TABLET | Freq: Once | ORAL | Status: AC
  Administered 2015-09-10: 8 mg via ORAL
  Filled 2015-09-10: qty 1

## 2015-09-10 MED ORDER — RHO D IMMUNE GLOBULIN 1500 UNIT/2ML IJ SOSY
300.0000 ug | PREFILLED_SYRINGE | Freq: Once | INTRAMUSCULAR | Status: AC
  Administered 2015-09-10: 300 ug via INTRAMUSCULAR
  Filled 2015-09-10: qty 2

## 2015-09-10 NOTE — MAU Provider Note (Signed)
History     CSN: [id number]  Arrival date and time: 09/10/15 [id number]   First Provider Initiated Contact with Patient 09/10/15 2017      Chief Complaint  Patient presents with  . Abdominal Cramping  . Vaginal Bleeding   HPI  Katrina Stewart is a 20 y.o. G3P0010 at [redacted]w[redacted]d who presents with abdominal cramping, n/v, & vaginal bleeding.  Abdominal cramping started 2 days ago. Reports lower abdominal cramping that comes & goes. Rates pain 8/10. Has taken ES tylenol without relief. Endorses nausea & vomiting, has vomited 4 times today. Has 2 antiemetics at home but hasn't taken anything for symptoms today. Denies diarrhea, constipation, or dysuria. Reports pink spotting on toilet paper x 2 episodes today. Denies vaginal discharge or irritation. Last intercourse 3 months ago.   OB History    Gravida Para Term Preterm AB Living   3       1     SAB TAB Ectopic Multiple Live Births   1              Past Medical History:  Diagnosis Date  . Anxiety   . Depression     Past Surgical History:  Procedure Laterality Date  . BREAST SURGERY    . MASS EXCISION  10/29/2011   Procedure: EXCISION MASS;  Surgeon: Harl Bowie, MD;  Location: [facility];  Service: General;  Laterality: Bilateral;  excision of bilateral breast masses    Family History  Problem Relation Age of Onset  . Cancer Maternal Grandmother     lung cancer  . Cancer Other     bladder cancer    Social History  Substance Use Topics  . Smoking status: Never Smoker  . Smokeless tobacco: Never Used  . Alcohol use 1.2 - 1.8 oz/week    2 - 3 Shots of liquor per week     Comment: none since pregnancy    Allergies: No Known Allergies  Prescriptions Prior to Admission  Medication Sig Dispense Refill Last Dose  . FLUoxetine (PROZAC) 20 MG capsule Take 1 capsule (20 mg total) by mouth daily. 30 capsule 3 Taking  . metoCLOPramide (REGLAN) 10 MG tablet Take 1 tablet (10 mg total) by mouth 3 (three) times daily before meals. 60  tablet 2 Taking  . Prenat-FeFum-FePo-FA-Omega 3 (CONCEPT DHA) 53.5-38-1 MG CAPS Take 1 tablet by mouth daily. 30 capsule 2 Taking  . promethazine (PHENERGAN) 12.5 MG tablet Take 1 tablet (12.5 mg total) by mouth at bedtime as needed for nausea or vomiting. 30 tablet 3 Taking    Review of Systems  Constitutional: Negative.   Gastrointestinal: Positive for abdominal pain, nausea and vomiting. Negative for constipation and diarrhea.  Genitourinary: Negative for dysuria.       + pink vaginal spotting earlier today No vaginal discharge or LOF   Physical Exam   Blood pressure 114/68, pulse 79, temperature 99 F (37.2 C), resp. rate 16, height 5\' 8"  (1.727 m), weight 222 lb 9.6 oz (101 kg), last menstrual period 05/15/2015.  Physical Exam  Nursing note and vitals reviewed. Constitutional: She is oriented to person, place, and time. She appears well-developed and well-nourished. No distress.  HENT:  Head: Normocephalic and atraumatic.  Eyes: Conjunctivae are normal. Right eye exhibits no discharge. Left eye exhibits no discharge. No scleral icterus.  Neck: Normal range of motion.  Respiratory: Effort normal. No respiratory distress.  GI: Soft. There is no tenderness.  Genitourinary: No bleeding in the vagina. Vaginal discharge (small amount  of thick white discharge) found.  Genitourinary Comments: No blood on exam Cervix closed  Neurological: She is alert and oriented to person, place, and time.  Skin: Skin is warm and dry. She is not diaphoretic.  Psychiatric: She has a normal mood and affect. Her behavior is normal. Judgment and thought content normal.    MAU Course  Procedures Results for orders placed or performed during the hospital encounter of 09/10/15 (from the past 24 hour(s))  Urinalysis, Routine w reflex microscopic (not at [facility])     Status: Abnormal   Collection Time: 09/10/15  8:06 PM  Result Value Ref Range   Color, Urine YELLOW YELLOW   APPearance CLEAR CLEAR    Specific Gravity, Urine 1.020 1.005 - 1.030   pH 7.0 5.0 - 8.0   Glucose, UA NEGATIVE NEGATIVE mg/dL   Hgb urine dipstick SMALL (A) NEGATIVE   Bilirubin Urine NEGATIVE NEGATIVE   Ketones, ur NEGATIVE NEGATIVE mg/dL   Protein, ur NEGATIVE NEGATIVE mg/dL   Nitrite NEGATIVE NEGATIVE   Leukocytes, UA TRACE (A) NEGATIVE  Urine microscopic-add on     Status: Abnormal   Collection Time: 09/10/15  8:06 PM  Result Value Ref Range   Squamous Epithelial / LPF 0-5 (A) NONE SEEN   WBC, UA 6-30 0 - 5 WBC/hpf   RBC / HPF NONE SEEN 0 - 5 RBC/hpf   Bacteria, UA MANY (A) NONE SEEN   Urine-Other MUCOUS PRESENT   Wet prep, genital     Status: Abnormal   Collection Time: 09/10/15  8:25 PM  Result Value Ref Range   Yeast Wet Prep HPF POC NONE SEEN NONE SEEN   Trich, Wet Prep NONE SEEN NONE SEEN   Clue Cells Wet Prep HPF POC NONE SEEN NONE SEEN   WBC, Wet Prep HPF POC MANY (A) NONE SEEN   Sperm NONE SEEN   Rh IG workup (includes ABO/Rh)     Status: None (Preliminary result)   Collection Time: 09/10/15  8:43 PM  Result Value Ref Range   Gestational Age(Wks) 16    ABO/RH(D) B NEG    Antibody Screen NEG    Unit Number [id number]    Blood Component Type RHIG    Unit division 00    Status of Unit ISSUED    Transfusion Status OK TO TRANSFUSE     MDM GC/CT & wet prep Rh negative -- rhogham given today Wet prep negative zofran 8 mg ODT  Assessment and Plan  A: 1. Vaginal bleeding in pregnancy, second trimester   2. Rubella non-immune status, antepartum   3. Supervision of normal pregnancy in second trimester   4. Rh negative state in antepartum period, second trimester, not applicable or unspecified fetus   5. Abdominal pain affecting pregnancy   6. Nausea and vomiting during pregnancy prior to [redacted] weeks gestation     P: Discharge home Keep f/u with ob GC/CT pending Discussed reasons to return to [facility] 09/10/2015, 8:17 PM

## 2015-09-10 NOTE — Discharge Instructions (Signed)
Vaginal Bleeding During Pregnancy, Second Trimester  A small amount of bleeding (spotting) from the vagina is common in pregnancy. Sometimes the bleeding is normal and is not a problem, and sometimes it is a sign of something serious. Be sure to tell your doctor about any bleeding from your vagina right away. HOME CARE  Watch your condition for any changes.  Follow your doctor's instructions about how active you can be.  Write down:  The number of pads you use each day.  How often you change pads.  How soaked (saturated) your pads are.  Do not use tampons.  Do not douche.  Do not have sex or orgasms until your doctor says it is okay.  If you pass any tissue from your vagina, save the tissue so you can show it to your doctor.  Only take medicines as told by your doctor.  Do not take aspirin because it can make you bleed.  Do not exercise, lift heavy weights, or do any activities that take a lot of energy and effort unless your doctor says it is okay.  Keep all follow-up visits as told by your doctor. GET HELP IF:   You bleed from your vagina.  You have cramps.  You have labor pains.  You have a fever that does not go away after you take medicine. GET HELP RIGHT AWAY IF:  You have very bad cramps in your back or belly (abdomen).  You have contractions.  You have chills.  You pass large clots or tissue from your vagina.  You bleed more.  You feel light-headed or weak.  You pass out (faint).  You are leaking fluid or have a gush of fluid from your vagina. MAKE SURE YOU:  Understand these instructions.  Will watch your condition.  Will get help right away if you are not doing well or get worse.   This information is not intended to replace advice given to you by your health care provider. Make sure you discuss any questions you have with your health care provider.   Document Released: 06/04/2013 Document Reviewed: 06/04/2013 Elsevier Interactive Patient  Education 2016 Elsevier Inc.    Abdominal Pain During Pregnancy Belly (abdominal) pain is common during pregnancy. Most of the time, it is not a serious problem. Other times, it can be a sign that something is wrong with the pregnancy. Always tell your doctor if you have belly pain. HOME CARE Monitor your belly pain for any changes. The following actions may help you feel better:  Do not have sex (intercourse) or put anything in your vagina until you feel better.  Rest until your pain stops.  Drink clear fluids if you feel sick to your stomach (nauseous). Do not eat solid food until you feel better.  Only take medicine as told by your doctor.  Keep all doctor visits as told. GET HELP RIGHT AWAY IF:   You are bleeding, leaking fluid, or pieces of tissue come out of your vagina.  You have more pain or cramping.  You keep throwing up (vomiting).  You have pain when you pee (urinate) or have blood in your pee.  You have a fever.  You do not feel your baby moving as much.  You feel very weak or feel like passing out.  You have trouble breathing, with or without belly pain.  You have a very bad headache and belly pain.  You have fluid leaking from your vagina and belly pain.  You keep having watery  poop (diarrhea).  Your belly pain does not go away after resting, or the pain gets worse. MAKE SURE YOU:   Understand these instructions.  Will watch your condition.  Will get help right away if you are not doing well or get worse.   This information is not intended to replace advice given to you by your health care provider. Make sure you discuss any questions you have with your health care provider.   Document Released: 01/06/2009 Document Revised: 09/20/2012 Document Reviewed: 08/17/2012 Elsevier Interactive Patient Education [employer].

## 2015-09-10 NOTE — MAU Note (Signed)
Pt reports she has been having abd cramping x 2-3 days and started spotting today.

## 2015-09-11 LAB — GC/CHLAMYDIA PROBE AMP (~~LOC~~) NOT AT ARMC
CHLAMYDIA, DNA PROBE: NEGATIVE
NEISSERIA GONORRHEA: NEGATIVE

## 2015-09-11 LAB — RH IG WORKUP (INCLUDES ABO/RH)
ABO/RH(D): B NEG
Antibody Screen: NEGATIVE
Gestational Age(Wks): 16
UNIT DIVISION: 0

## 2015-09-13 ENCOUNTER — Inpatient Hospital Stay (HOSPITAL_COMMUNITY)
Admission: AD | Admit: 2015-09-13 | Discharge: 2015-09-14 | Disposition: A | Discharge status: Home / Self Care | Payer: Medicaid Other | Source: Ambulatory Visit | Attending: Obstetrics & Gynecology | Admitting: Obstetrics & Gynecology

## 2015-09-13 DIAGNOSIS — O9989 Other specified diseases and conditions complicating pregnancy, childbirth and the puerperium: Secondary | ICD-10-CM

## 2015-09-13 DIAGNOSIS — B373 Candidiasis of vulva and vagina: Secondary | ICD-10-CM | POA: Insufficient documentation

## 2015-09-13 DIAGNOSIS — K640 First degree hemorrhoids: Secondary | ICD-10-CM

## 2015-09-13 DIAGNOSIS — Z283 Underimmunization status: Secondary | ICD-10-CM

## 2015-09-13 DIAGNOSIS — B3731 Acute candidiasis of vulva and vagina: Secondary | ICD-10-CM

## 2015-09-13 DIAGNOSIS — O26892 Other specified pregnancy related conditions, second trimester: Secondary | ICD-10-CM | POA: Insufficient documentation

## 2015-09-13 DIAGNOSIS — Z3492 Encounter for supervision of normal pregnancy, unspecified, second trimester: Secondary | ICD-10-CM

## 2015-09-13 DIAGNOSIS — Z3A17 17 weeks gestation of pregnancy: Secondary | ICD-10-CM | POA: Insufficient documentation

## 2015-09-13 DIAGNOSIS — O2242 Hemorrhoids in pregnancy, second trimester: Secondary | ICD-10-CM | POA: Insufficient documentation

## 2015-09-13 DIAGNOSIS — O09899 Supervision of other high risk pregnancies, unspecified trimester: Secondary | ICD-10-CM

## 2015-09-14 DIAGNOSIS — K640 First degree hemorrhoids: Secondary | ICD-10-CM | POA: Diagnosis not present

## 2015-09-14 DIAGNOSIS — L292 Pruritus vulvae: Secondary | ICD-10-CM | POA: Diagnosis present

## 2015-09-14 DIAGNOSIS — O2242 Hemorrhoids in pregnancy, second trimester: Secondary | ICD-10-CM | POA: Diagnosis not present

## 2015-09-14 DIAGNOSIS — Z3A17 17 weeks gestation of pregnancy: Secondary | ICD-10-CM | POA: Diagnosis not present

## 2015-09-14 DIAGNOSIS — O26892 Other specified pregnancy related conditions, second trimester: Secondary | ICD-10-CM | POA: Diagnosis present

## 2015-09-14 DIAGNOSIS — B373 Candidiasis of vulva and vagina: Secondary | ICD-10-CM

## 2015-09-14 LAB — WET PREP, GENITAL
SPERM: NONE SEEN
Trich, Wet Prep: NONE SEEN

## 2015-09-14 MED ORDER — TERCONAZOLE 0.4 % VA CREA: 1.0000 | TOPICAL_CREAM | Freq: Every day | VAGINAL | 1 refills | Status: DC

## 2015-09-14 MED ORDER — HYDROCORTISONE ACE-PRAMOXINE 1-1 % RE FOAM: 1.0000 | Freq: Two times a day (BID) | RECTAL | 2 refills | Status: DC

## 2015-09-14 NOTE — MAU Note (Signed)
Urine sent to the lab

## 2015-09-14 NOTE — MAU Provider Note (Signed)
Chief Complaint:  Vaginal Itching   First Provider Initiated Contact with Patient 09/14/15 0022     HPI  Katrina Stewart is a 20 y.o. G3P0010 at 68w1dwho presents to maternity admissions reporting vaginal itching and irritation.  Has used some yeast cream on outside but it didn't help. She denies LOF, vaginal bleeding, urinary symptoms, h/a, dizziness, n/v, diarrhea, constipation or fever/chills.    RN Note: Vaginal itching, burning, and swelling for 2 days. Think may be yeast infection. Think I am bleeding alittle from irritation from vaginal itching. No vag d/c  Past Medical History: Past Medical History:  Diagnosis Date  . Anxiety   . Depression     Past obstetric history: OB History  Gravida Para Term Preterm AB Living  3       1    SAB TAB Ectopic Multiple Live Births  1            # Outcome Date GA Lbr Len/2nd Weight Sex Delivery Anes PTL Lv  3 Current           2 Gravida 2015          1 SAB               Past Surgical History: Past Surgical History:  Procedure Laterality Date  . BREAST SURGERY    . MASS EXCISION  10/29/2011   Procedure: EXCISION MASS;  Surgeon: Harl Bowie, MD;  Location: [facility];  Service: General;  Laterality: Bilateral;  excision of bilateral breast masses    Family History: Family History  Problem Relation Age of Onset  . Cancer Maternal Grandmother     lung cancer  . Cancer Other     bladder cancer    Social History: Social History  Substance Use Topics  . Smoking status: Never Smoker  . Smokeless tobacco: Never Used  . Alcohol use 1.2 - 1.8 oz/week    2 - 3 Shots of liquor per week     Comment: none since pregnancy    Allergies: No Known Allergies  Meds:  Prescriptions Prior to Admission  Medication Sig Dispense Refill Last Dose  . acetaminophen (TYLENOL) 500 MG tablet Take 1,000 mg by mouth every 6 (six) hours as needed for moderate pain.   09/10/2015 at Unknown time  . FLUoxetine (PROZAC) 20 MG capsule Take 1 capsule  (20 mg total) by mouth daily. 30 capsule 3 09/09/2015 at Unknown time  . metoCLOPramide (REGLAN) 10 MG tablet Take 1 tablet (10 mg total) by mouth 3 (three) times daily before meals. 60 tablet 2 09/09/2015 at Unknown time  . Prenat-FeFum-FePo-FA-Omega 3 (CONCEPT DHA) 53.5-38-1 MG CAPS Take 1 tablet by mouth daily. 30 capsule 2 09/10/2015 at Unknown time  . promethazine (PHENERGAN) 12.5 MG tablet Take 1 tablet (12.5 mg total) by mouth at bedtime as needed for nausea or vomiting. 30 tablet 3 09/09/2015 at Unknown time    I have reviewed patient's Past Medical Hx, Surgical Hx, Family Hx, Social Hx, medications and allergies.   ROS:  Review of Systems  Constitutional: Negative for chills and fever.  Gastrointestinal: Negative for abdominal pain, constipation, diarrhea, nausea and vomiting.       Hemorrhoids bleed some times  Genitourinary: Positive for vaginal discharge and vaginal pain (not pain but irritation). Negative for dysuria and pelvic pain.  Neurological: Negative for dizziness.   Other systems negative  Physical Exam  Patient Vitals for the past 24 hrs:  BP Temp Pulse Resp Height Weight  09/14/15 0018 136/87 - - - - -  09/14/15 0014 139/89 98.4 F (36.9 C) 96 18 5\' 8"  (1.727 m) 225 lb 6.4 oz (102.2 kg)   Constitutional: Well-developed, well-nourished female in no acute distress.  Cardiovascular: normal rate and rhythm Respiratory: normal effort, clear to auscultation bilaterally GI: Abd soft, non-tender, gravid appropriate for gestational age.   No rebound or guarding. MS: Extremities nontender, no edema, normal ROM Neurologic: Alert and oriented x 4.  GU: Neg CVAT.  PELVIC EXAM: Cervix pink, visually closed, without lesion, scant white clumpy discharge, vaginal walls and external genitalia slightly erethematous   FHR 156  Labs: No results found for this or any previous visit (from the past 24 hour(s)). --/--/B NEG (08/09 2043)  Imaging:  No results found.  MAU  Course/MDM: I have ordered labs and reviewed results.   Discussed yeast vaginitis with patient WIll order Terazol 7 with refill for intravaginal use Will also order her some Procotofoam HC for her hemorrhoids.    Assessment: 1. Rubella non-immune status, antepartum   2. Supervision of normal pregnancy in second trimester   3.     Yeast vaginitis 4.     Hemorrhoids  Plan: Discharge home Rx Terazol 7 for intravaginal use for yeast vaginitis Rx Proctofoam HC for hemorrhoids. Preterm Labor precautions and fetal kick counts Follow up in Office for prenatal visits and recheck   Pt stable at time of discharge.  Encouraged to return here or to other Urgent Care/ED if she develops worsening of symptoms, increase in pain, fever, or other concerning symptoms.      Hansel Feinstein CNM, MSN Certified Nurse-Midwife 09/14/2015 12:23 AM

## 2015-09-14 NOTE — Discharge Instructions (Signed)
Hemorrhoids °Hemorrhoids are swollen veins around the rectum or anus. There are two types of hemorrhoids:  °· Internal hemorrhoids. These occur in the veins just inside the rectum. They may poke through to the outside and become irritated and painful. °· External hemorrhoids. These occur in the veins outside the anus and can be felt as a painful swelling or hard lump near the anus. °CAUSES °· Pregnancy.   °· Obesity.   °· Constipation or diarrhea.   °· Straining to have a bowel movement.   °· Sitting for long periods on the toilet. °· Heavy lifting or other activity that caused you to strain. °· Anal intercourse. °SYMPTOMS  °· Pain.   °· Anal itching or irritation.   °· Rectal bleeding.   °· Fecal leakage.   °· Anal swelling.   °· One or more lumps around the anus.   °DIAGNOSIS  °Your caregiver may be able to diagnose hemorrhoids by visual examination. Other examinations or tests that may be performed include:  °· Examination of the rectal area with a gloved hand (digital rectal exam).   °· Examination of anal canal using a small tube (scope).   °· A blood test if you have lost a significant amount of blood. °· A test to look inside the colon (sigmoidoscopy or colonoscopy). °TREATMENT °Most hemorrhoids can be treated at home. However, if symptoms do not seem to be getting better or if you have a lot of rectal bleeding, your caregiver may perform a procedure to help make the hemorrhoids get smaller or remove them completely. Possible treatments include:  °· Placing a rubber band at the base of the hemorrhoid to cut off the circulation (rubber band ligation).   °· Injecting a chemical to shrink the hemorrhoid (sclerotherapy).   °· Using a tool to burn the hemorrhoid (infrared light therapy).   °· Surgically removing the hemorrhoid (hemorrhoidectomy).   °· Stapling the hemorrhoid to block blood flow to the tissue (hemorrhoid stapling).   °HOME CARE INSTRUCTIONS  °· Eat foods with fiber, such as whole grains, beans,  nuts, fruits, and vegetables. Ask your doctor about taking products with added fiber in them (fiber supplements). °· Increase fluid intake. Drink enough water and fluids to keep your urine clear or pale yellow.   °· Exercise regularly.   °· Go to the bathroom when you have the urge to have a bowel movement. Do not wait.   °· Avoid straining to have bowel movements.   °· Keep the anal area dry and clean. Use wet toilet paper or moist towelettes after a bowel movement.   °· Medicated creams and suppositories may be used or applied as directed.   °· Only take over-the-counter or prescription medicines as directed by your caregiver.   °· Take warm sitz baths for 15-20 minutes, 3-4 times a day to ease pain and discomfort.   °· Place ice packs on the hemorrhoids if they are tender and swollen. Using ice packs between sitz baths may be helpful.   °¨ Put ice in a plastic bag.   °¨ Place a towel between your skin and the bag.   °¨ Leave the ice on for 15-20 minutes, 3-4 times a day.   °· Do not use a donut-shaped pillow or sit on the toilet for long periods. This increases blood pooling and pain.   °SEEK MEDICAL CARE IF: °· You have increasing pain and swelling that is not controlled by treatment or medicine. °· You have uncontrolled bleeding. °· You have difficulty or you are unable to have a bowel movement. °· You have pain or inflammation outside the area of the hemorrhoids. °MAKE SURE YOU: °· Understand these instructions. °·   Will watch your condition.  Will get help right away if you are not doing well or get worse.   This information is not intended to replace advice given to you by your health care provider. Make sure you discuss any questions you have with your health care provider.   Document Released: 01/16/2000 Document Revised: 01/05/2012 Document Reviewed: 11/23/2011 Elsevier Interactive Patient Education 2016 Elsevier Inc. Monilial Vaginitis Vaginitis in a soreness, swelling and redness (inflammation)  of the vagina and vulva. Monilial vaginitis is not a sexually transmitted infection. CAUSES  Yeast vaginitis is caused by yeast (candida) that is normally found in your vagina. With a yeast infection, the candida has overgrown in number to a point that upsets the chemical balance. SYMPTOMS   White, thick vaginal discharge.  Swelling, itching, redness and irritation of the vagina and possibly the lips of the vagina (vulva).  Burning or painful urination.  Painful intercourse. DIAGNOSIS  Things that may contribute to monilial vaginitis are:  Postmenopausal and virginal states.  Pregnancy.  Infections.  Being tired, sick or stressed, especially if you had monilial vaginitis in the past.  Diabetes. Good control will help lower the chance.  Birth control pills.  Tight fitting garments.  Using bubble bath, feminine sprays, douches or deodorant tampons.  Taking certain medications that kill germs (antibiotics).  Sporadic recurrence can occur if you become ill. TREATMENT  Your caregiver will give you medication.  There are several kinds of anti monilial vaginal creams and suppositories specific for monilial vaginitis. For recurrent yeast infections, use a suppository or cream in the vagina 2 times a week, or as directed.  Anti-monilial or steroid cream for the itching or irritation of the vulva may also be used. Get your caregiver's permission.  Painting the vagina with methylene blue solution may help if the monilial cream does not work.  Eating yogurt may help prevent monilial vaginitis. HOME CARE INSTRUCTIONS   Finish all medication as prescribed.  Do not have sex until treatment is completed or after your caregiver tells you it is okay.  Take warm sitz baths.  Do not douche.  Do not use tampons, especially scented ones.  Wear cotton underwear.  Avoid tight pants and panty hose.  Tell your sexual partner that you have a yeast infection. They should go to their  caregiver if they have symptoms such as mild rash or itching.  Your sexual partner should be treated as well if your infection is difficult to eliminate.  Practice safer sex. Use condoms.  Some vaginal medications cause latex condoms to fail. Vaginal medications that harm condoms are:  Cleocin cream.  Butoconazole (Femstat).  Terconazole (Terazol) vaginal suppository.  Miconazole (Monistat) (may be purchased over the counter). SEEK MEDICAL CARE IF:   You have a temperature by mouth above 102 F (38.9 C).  The infection is getting worse after 2 days of treatment.  The infection is not getting better after 3 days of treatment.  You develop blisters in or around your vagina.  You develop vaginal bleeding, and it is not your menstrual period.  You have pain when you urinate.  You develop intestinal problems.  You have pain with sexual intercourse.   This information is not intended to replace advice given to you by your health care provider. Make sure you discuss any questions you have with your health care provider.   Document Released: 10/28/2004 Document Revised: 04/12/2011 Document Reviewed: 07/22/2014 Elsevier Interactive Patient Education [employer].

## 2015-09-14 NOTE — MAU Note (Signed)
Vaginal itching, burning, and swelling for 2 days. Think may be yeast infection. Think I am bleeding alittle from irritation from vaginal itching. No vag d/c

## 2015-09-14 NOTE — Progress Notes (Signed)
Marie Williams CNM in earlier to discuss test results and d/c plan. Written and verbal d/c instructions given and understanding voiced °

## 2015-09-25 ENCOUNTER — Ambulatory Visit (INDEPENDENT_AMBULATORY_CARE_PROVIDER_SITE_OTHER): Payer: Medicaid Other | Admitting: Obstetrics & Gynecology

## 2015-09-25 VITALS — BP 111/68 | HR 86 | Wt 219.9 lb

## 2015-09-25 DIAGNOSIS — Z3492 Encounter for supervision of normal pregnancy, unspecified, second trimester: Secondary | ICD-10-CM

## 2015-09-25 DIAGNOSIS — N898 Other specified noninflammatory disorders of vagina: Secondary | ICD-10-CM

## 2015-09-25 DIAGNOSIS — L298 Other pruritus: Secondary | ICD-10-CM

## 2015-09-25 DIAGNOSIS — Z3482 Encounter for supervision of other normal pregnancy, second trimester: Secondary | ICD-10-CM

## 2015-09-25 LAB — POCT URINALYSIS DIP (DEVICE)
GLUCOSE, UA: NEGATIVE mg/dL
NITRITE: NEGATIVE
PH: 5.5 (ref 5.0–8.0)
Protein, ur: 30 mg/dL — AB
Urobilinogen, UA: 0.2 mg/dL (ref 0.0–1.0)

## 2015-09-25 NOTE — Progress Notes (Signed)
Pt complaining of vaginal yeast infection which was treated but she continues to have a lot of external itching.

## 2015-09-25 NOTE — Progress Notes (Signed)
Pt complaining of vaginal yeast infection which was treated but she continues to have a lot of external itching. Subjective:  Katrina Stewart is a 20 y.o. G3P0010 at [redacted]w[redacted]d being seen today for ongoing prenatal care.  She is currently monitored for the following issues for this low-risk pregnancy and has Severe major depression, single episode, without psychotic features ([place]); Major depressive disorder, recurrent episode, severe ([place]); Alcohol use disorder ([place]); Severe episode of recurrent major depressive disorder, without psychotic features ([place]); Supervision of normal pregnancy in second trimester; Rh negative state in antepartum period; and Rubella non-immune status, antepartum on her problem list.  Patient reports vaginal irritation.  Contractions: Not present. Vag. Bleeding: None.  Movement: Present. Denies leaking of fluid.   The following portions of the patient's history were reviewed and updated as appropriate: allergies, current medications, past family history, past medical history, past social history, past surgical history and problem list. Problem list updated.  Objective:   Vitals:   09/25/15 0930  BP: 111/68  Pulse: 86  Weight: 219 lb 14.4 oz (99.7 kg)    Fetal Status: Fetal Heart Rate (bpm): 155   Movement: Present     General:  Alert, oriented and cooperative. Patient is in no acute distress.  Skin: Skin is warm and dry. No rash noted.   Cardiovascular: Normal heart rate noted  Respiratory: Normal respiratory effort, no problems with respiration noted  Abdomen: Soft, gravid, appropriate for gestational age. Pain/Pressure: Absent     Pelvic:  Cervical exam deferred        Extremities: Normal range of motion.  Edema: None  Mental Status: Normal mood and affect. Normal behavior. Normal judgment and thought content.   Urinalysis:      Assessment and Plan:  Pregnancy: G3P0010 at [redacted]w[redacted]d  1. Supervision of normal pregnancy in second trimester Vulvar irritation noted -  [name] + 14 WK; Future - AFP, Quad Screen - Culture, OB Urine  2. Vaginal itching  - Wet prep, genital Finish Terconazole Preterm labor symptoms and general obstetric precautions including but not limited to vaginal bleeding, contractions, leaking of fluid and fetal movement were reviewed in detail with the patient. Please refer to After Visit Summary for other counseling recommendations.  Return in about 4 weeks (around 10/23/2015). [place] scheduled  [name], MD

## 2015-09-25 NOTE — Patient Instructions (Signed)

## 2015-09-26 LAB — AFP, QUAD SCREEN
AFP: 32 ng/mL
Age Alone: 1:1140 {titer}
CURR GEST AGE: 18.7 wk
HCG TOTAL: 8.05 [IU]/mL
INH: 62.6 pg/mL
INTERPRETATION-AFP: NEGATIVE
MOM FOR HCG: 0.39
MoM for AFP: 0.76
MoM for INH: 0.44
OPEN SPINA BIFIDA: NEGATIVE
TRI 18 SCR RISK EST: NEGATIVE
UE3 VALUE: 1.64 ng/mL
uE3 Mom: 1.22

## 2015-09-26 LAB — WET PREP, GENITAL
TRICH WET PREP: NONE SEEN
YEAST WET PREP: NONE SEEN

## 2015-09-27 LAB — CULTURE, OB URINE

## 2015-09-30 ENCOUNTER — Telehealth: Payer: Self-pay

## 2015-09-30 MED ORDER — AMPICILLIN 500 MG PO CAPS: 500.0000 mg | ORAL_CAPSULE | Freq: Three times a day (TID) | ORAL | 0 refills | Status: DC

## 2015-09-30 MED ORDER — METRONIDAZOLE 500 MG PO TABS: 500.0000 mg | ORAL_TABLET | Freq: Two times a day (BID) | ORAL | 0 refills | Status: DC

## 2015-09-30 NOTE — Telephone Encounter (Signed)
Per Dr. Roselie Awkward, pt needs Flagyl 500 mg po bid x 7 days for BV and Ampicillin 500 mg po tid x 7 days for GBS in the urine.  Notified pt of need for antibiotic tx.  Pt stated understanding with no further questions.

## 2015-10-03 ENCOUNTER — Ambulatory Visit (HOSPITAL_COMMUNITY)
Admission: RE | Admit: 2015-10-03 | Discharge: 2015-10-03 | Disposition: A | Discharge status: Home / Self Care | Payer: Medicaid Other | Source: Ambulatory Visit | Attending: Obstetrics & Gynecology | Admitting: Obstetrics & Gynecology

## 2015-10-03 ENCOUNTER — Other Ambulatory Visit: Payer: Self-pay | Admitting: Obstetrics & Gynecology

## 2015-10-03 DIAGNOSIS — Z3A19 19 weeks gestation of pregnancy: Secondary | ICD-10-CM

## 2015-10-03 DIAGNOSIS — Z36 Encounter for antenatal screening of mother: Secondary | ICD-10-CM | POA: Diagnosis present

## 2015-10-03 DIAGNOSIS — Z1389 Encounter for screening for other disorder: Secondary | ICD-10-CM

## 2015-10-03 DIAGNOSIS — Z3492 Encounter for supervision of normal pregnancy, unspecified, second trimester: Secondary | ICD-10-CM

## 2015-10-20 ENCOUNTER — Telehealth: Payer: Self-pay | Admitting: *Deleted

## 2015-10-20 NOTE — Telephone Encounter (Addendum)
Pt left message on 9/14 @ 0952 stating that she is [redacted] wks pregnant and has not felt the baby move since the previous day.   Pt left additional message on 9/15 @ 1115 wanting to know what cold medicines she take.

## 2015-10-23 ENCOUNTER — Ambulatory Visit (INDEPENDENT_AMBULATORY_CARE_PROVIDER_SITE_OTHER): Payer: Medicaid Other | Admitting: Obstetrics and Gynecology

## 2015-10-23 VITALS — BP 122/77 | HR 84 | Wt 225.7 lb

## 2015-10-23 DIAGNOSIS — F332 Major depressive disorder, recurrent severe without psychotic features: Secondary | ICD-10-CM

## 2015-10-23 DIAGNOSIS — Z3482 Encounter for supervision of other normal pregnancy, second trimester: Secondary | ICD-10-CM

## 2015-10-23 DIAGNOSIS — Z23 Encounter for immunization: Secondary | ICD-10-CM

## 2015-10-23 DIAGNOSIS — Z3492 Encounter for supervision of normal pregnancy, unspecified, second trimester: Secondary | ICD-10-CM

## 2015-10-23 DIAGNOSIS — O360110 Maternal care for anti-D [Rh] antibodies, first trimester, not applicable or unspecified: Secondary | ICD-10-CM

## 2015-10-23 NOTE — Progress Notes (Signed)
Subjective:  Katrina Stewart is a 20 y.o. G3P0010 at [redacted]w[redacted]d being seen today for ongoing prenatal care.  She is currently monitored for the following issues for this low-risk pregnancy and has Severe major depression, single episode, without psychotic features ([place]); Major depressive disorder, recurrent episode, severe ([place]); Alcohol use disorder ([place]); Severe episode of recurrent major depressive disorder, without psychotic features ([place]); Supervision of normal pregnancy in second trimester; Rh negative state in antepartum period; and Rubella non-immune status, antepartum on her problem list.  Patient reports no complaints.  Contractions: Not present. Vag. Bleeding: None.  Movement: Present. Denies leaking of fluid.   The following portions of the patient's history were reviewed and updated as appropriate: allergies, current medications, past family history, past medical history, past social history, past surgical history and problem list. Problem list updated.  Objective:   Vitals:   10/23/15 1052  BP: 122/77  Pulse: 84  Weight: 102.4 kg (225 lb 11.2 oz)    Fetal Status: Fetal Heart Rate (bpm): 148   Movement: Present     General:  Alert, oriented and cooperative. Patient is in no acute distress.  Skin: Skin is warm and dry. No rash noted.   Cardiovascular: Normal heart rate noted  Respiratory: Normal respiratory effort, no problems with respiration noted  Abdomen: Soft, gravid, appropriate for gestational age. Pain/Pressure: Absent     Pelvic:  Cervical exam deferred        Extremities: Normal range of motion.  Edema: None  Mental Status: Normal mood and affect. Normal behavior. Normal judgment and thought content.   Urinalysis:      Assessment and Plan:  Pregnancy: G3P0010 at [redacted]w[redacted]d  1. Flu vaccine need  - Flu Vaccine QUAD 36+ mos IM (Fluarix, Quad PF)  2. Supervision of normal pregnancy in second trimester Depression stable on Prozac. Sees counselor  Preterm labor symptoms  and general obstetric precautions including but not limited to vaginal bleeding, contractions, leaking of fluid and fetal movement were reviewed in detail with the patient. Please refer to After Visit Summary for other counseling recommendations.  No Follow-up on file.   Chancy Milroy, MD

## 2015-10-29 NOTE — Telephone Encounter (Signed)
Spoke with patient she is doing well now. She has upcoming appointment on 10/28

## 2015-11-27 ENCOUNTER — Ambulatory Visit (INDEPENDENT_AMBULATORY_CARE_PROVIDER_SITE_OTHER): Payer: Medicaid Other | Admitting: Obstetrics and Gynecology

## 2015-11-27 ENCOUNTER — Ambulatory Visit (INDEPENDENT_AMBULATORY_CARE_PROVIDER_SITE_OTHER): Payer: Self-pay | Admitting: Clinical

## 2015-11-27 ENCOUNTER — Encounter: Payer: Self-pay | Admitting: Obstetrics and Gynecology

## 2015-11-27 VITALS — BP 121/73 | HR 78 | Wt 240.0 lb

## 2015-11-27 DIAGNOSIS — F322 Major depressive disorder, single episode, severe without psychotic features: Secondary | ICD-10-CM

## 2015-11-27 DIAGNOSIS — O99342 Other mental disorders complicating pregnancy, second trimester: Secondary | ICD-10-CM

## 2015-11-27 DIAGNOSIS — O360920 Maternal care for other rhesus isoimmunization, second trimester, not applicable or unspecified: Secondary | ICD-10-CM | POA: Diagnosis not present

## 2015-11-27 DIAGNOSIS — Z23 Encounter for immunization: Secondary | ICD-10-CM | POA: Diagnosis not present

## 2015-11-27 DIAGNOSIS — O09899 Supervision of other high risk pregnancies, unspecified trimester: Secondary | ICD-10-CM

## 2015-11-27 DIAGNOSIS — O9989 Other specified diseases and conditions complicating pregnancy, childbirth and the puerperium: Secondary | ICD-10-CM

## 2015-11-27 DIAGNOSIS — Z6791 Unspecified blood type, Rh negative: Secondary | ICD-10-CM

## 2015-11-27 DIAGNOSIS — Z283 Underimmunization status: Secondary | ICD-10-CM

## 2015-11-27 DIAGNOSIS — Z3482 Encounter for supervision of other normal pregnancy, second trimester: Secondary | ICD-10-CM

## 2015-11-27 DIAGNOSIS — F3341 Major depressive disorder, recurrent, in partial remission: Secondary | ICD-10-CM

## 2015-11-27 DIAGNOSIS — O26899 Other specified pregnancy related conditions, unspecified trimester: Secondary | ICD-10-CM

## 2015-11-27 LAB — CBC
HEMATOCRIT: 31.4 % — AB (ref 35.0–45.0)
Hemoglobin: 10.1 g/dL — ABNORMAL LOW (ref 11.7–15.5)
MCH: 23.7 pg — AB (ref 27.0–33.0)
MCHC: 32.2 g/dL (ref 32.0–36.0)
MCV: 73.5 fL — AB (ref 80.0–100.0)
MPV: 10.2 fL (ref 7.5–12.5)
PLATELETS: 288 10*3/uL (ref 140–400)
RBC: 4.27 MIL/uL (ref 3.80–5.10)
RDW: 14.2 % (ref 11.0–15.0)
WBC: 9.4 10*3/uL (ref 3.8–10.8)

## 2015-11-27 MED ORDER — TETANUS-DIPHTH-ACELL PERTUSSIS 5-2.5-18.5 LF-MCG/0.5 IM SUSP
0.5000 mL | Freq: Once | INTRAMUSCULAR | Status: AC
  Administered 2015-11-27: 0.5 mL via INTRAMUSCULAR

## 2015-11-27 MED ORDER — FLUOXETINE HCL 40 MG PO CAPS: 40.0000 mg | ORAL_CAPSULE | Freq: Every day | ORAL | 3 refills | Status: DC

## 2015-11-27 MED ORDER — RHO D IMMUNE GLOBULIN 1500 UNIT/2ML IJ SOSY
300.0000 ug | PREFILLED_SYRINGE | Freq: Once | INTRAMUSCULAR | Status: AC
  Administered 2015-11-27: 300 ug via INTRAMUSCULAR

## 2015-11-27 NOTE — Patient Instructions (Signed)
Etonogestrel implant What is this medicine? ETONOGESTREL (et oh noe JES trel) is a contraceptive (birth control) device. It is used to prevent pregnancy. It can be used for up to 3 years. This medicine may be used for other purposes; ask your health care provider or pharmacist if you have questions. What should I tell my health care provider before I take this medicine? They need to know if you have any of these conditions: -abnormal vaginal bleeding -blood vessel disease or blood clots -cancer of the breast, cervix, or liver -depression -diabetes -gallbladder disease -headaches -heart disease or recent heart attack -high blood pressure -high cholesterol -kidney disease -liver disease -renal disease -seizures -tobacco smoker -an unusual or allergic reaction to etonogestrel, other hormones, anesthetics or antiseptics, medicines, foods, dyes, or preservatives -pregnant or trying to get pregnant -breast-feeding How should I use this medicine? This device is inserted just under the skin on the inner side of your upper arm by a health care professional. Talk to your pediatrician regarding the use of this medicine in children. Special care may be needed. Overdosage: If you think you have taken too much of this medicine contact a poison control center or emergency room at once. NOTE: This medicine is only for you. Do not share this medicine with others. What if I miss a dose? This does not apply. What may interact with this medicine? Do not take this medicine with any of the following medications: -amprenavir -bosentan -fosamprenavir This medicine may also interact with the following medications: -barbiturate medicines for inducing sleep or treating seizures -certain medicines for fungal infections like ketoconazole and itraconazole -griseofulvin -medicines to treat seizures like carbamazepine, felbamate, oxcarbazepine, phenytoin,  topiramate -modafinil -phenylbutazone -rifampin -some medicines to treat HIV infection like atazanavir, indinavir, lopinavir, nelfinavir, tipranavir, ritonavir -St. John's wort This list may not describe all possible interactions. Give your health care provider a list of all the medicines, herbs, non-prescription drugs, or dietary supplements you use. Also tell them if you smoke, drink alcohol, or use illegal drugs. Some items may interact with your medicine. What should I watch for while using this medicine? This product does not protect you against HIV infection (AIDS) or other sexually transmitted diseases. You should be able to feel the implant by pressing your fingertips over the skin where it was inserted. Contact your doctor if you cannot feel the implant, and use a non-hormonal birth control method (such as condoms) until your doctor confirms that the implant is in place. If you feel that the implant may have broken or become bent while in your arm, contact your healthcare provider. What side effects may I notice from receiving this medicine? Side effects that you should report to your doctor or health care professional as soon as possible: -allergic reactions like skin rash, itching or hives, swelling of the face, lips, or tongue -breast lumps -changes in emotions or moods -depressed mood -heavy or prolonged menstrual bleeding -pain, irritation, swelling, or bruising at the insertion site -scar at site of insertion -signs of infection at the insertion site such as fever, and skin redness, pain or discharge -signs of pregnancy -signs and symptoms of a blood clot such as breathing problems; changes in vision; chest pain; severe, sudden headache; pain, swelling, warmth in the leg; trouble speaking; sudden numbness or weakness of the face, arm or leg -signs and symptoms of liver injury like dark yellow or brown urine; general ill feeling or flu-like symptoms; light-colored stools; loss of  appetite; nausea; right upper belly   pain; unusually weak or tired; yellowing of the eyes or skin -unusual vaginal bleeding, discharge -signs and symptoms of a stroke like changes in vision; confusion; trouble speaking or understanding; severe headaches; sudden numbness or weakness of the face, arm or leg; trouble walking; dizziness; loss of balance or coordination Side effects that usually do not require medical attention (Report these to your doctor or health care professional if they continue or are bothersome.): -acne -back pain -breast pain -changes in weight -dizziness -general ill feeling or flu-like symptoms -headache -irregular menstrual bleeding -nausea -sore throat -vaginal irritation or inflammation This list may not describe all possible side effects. Call your doctor for medical advice about side effects. You may report side effects to FDA at 1-800-FDA-1088. Where should I keep my medicine? This drug is given in a hospital or clinic and will not be stored at home. NOTE: This sheet is a summary. It may not cover all possible information. If you have questions about this medicine, talk to your doctor, pharmacist, or health care provider.    Intrauterine Device Information An intrauterine device (IUD) is inserted into your uterus to prevent pregnancy. There are two types of IUDs available:   Copper IUD--This type of IUD is wrapped in copper wire and is placed inside the uterus. Copper makes the uterus and fallopian tubes produce a fluid that kills sperm. The copper IUD can stay in place for 10 years.  Hormone IUD--This type of IUD contains the hormone progestin (synthetic progesterone). The hormone thickens the cervical mucus and prevents sperm from entering the uterus. It also thins the uterine lining to prevent implantation of a fertilized egg. The hormone can weaken or kill the sperm that get into the uterus. One type of hormone IUD can stay in place for 5 years, and another  type can stay in place for 3 years. Your health care provider will make sure you are a good candidate for a contraceptive IUD. Discuss with your health care provider the possible side effects.  ADVANTAGES OF AN INTRAUTERINE DEVICE  IUDs are highly effective, reversible, long acting, and low maintenance.   There are no estrogen-related side effects.   An IUD can be used when breastfeeding.   IUDs are not associated with weight gain.   The copper IUD works immediately after insertion.   The hormone IUD works right away if inserted within 7 days of your period starting. You will need to use a backup method of birth control for 7 days if the hormone IUD is inserted at any other time in your cycle.  The copper IUD does not interfere with your female hormones.   The hormone IUD can make heavy menstrual periods lighter and decrease cramping.   The hormone IUD can be used for 3 or 5 years.   The copper IUD can be used for 10 years. DISADVANTAGES OF AN INTRAUTERINE DEVICE  The hormone IUD can be associated with irregular bleeding patterns.   The copper IUD can make your menstrual flow heavier and more painful.   You may experience cramping and vaginal bleeding after insertion.    This information is not intended to replace advice given to you by your health care provider. Make sure you discuss any questions you have with your health care provider.   Document Released: 12/23/2003 Document Revised: 09/20/2012 Document Reviewed: 07/09/2012 Elsevier Interactive Patient Education [employer].

## 2015-11-27 NOTE — BH Specialist Note (Signed)
Session Start time: 9:30   End Time: 9:50 Total Time:  20 minutes Type of Service: [place] Interpreter: No.   Interpreter Name & Language: n/a # [facility] Visits July 2017-June 2018: 2nd   SUBJECTIVE: Katrina Stewart is a 20 y.o. female  Pt. was f/u for:  depression. Pt. reports the following symptoms/concerns: Pt states that her primary concern is that she is uncertain whether or not she wants to give baby up for adoption or not, and does not feel her medications are helping her with depression. Duration of problem:  Increasing over past two months Severity: mild Previous treatment: has attended individual therapy in the past   OBJECTIVE: Mood: Depressed & Affect: Appropriate Risk of harm to self or others: low risk of harm to self today, no SI today; no known risk of harm to others Assessments administered: none   GOALS ADDRESSED:  Alleviate symptoms of depression  INTERVENTIONS: Solution Focused and Supportive   ASSESSMENT:  Pt currently experiencing Major depressive disorder, in partial remission.  Pt may benefit from brief therapeutic intervention regarding coping with symptoms of depression.      PLAN: 1. F/U with behavioral health clinician: Two weeks for [facility] med symptom check 2. Behavioral Health meds: Prozac 3. Behavioral recommendations:  -Consider starting back with individual therapy on routine basis -Consider working with adoption agency -Take BH meds as prescribed 4. Referral: Brief Counseling/Psychotherapy and Community Resource   [place] Clinician  Geraldine Contras: no  Depression screen [facility] 2/9 11/27/2015 08/27/2015  Decreased Interest 2 3  Down, Depressed, Hopeless 2 3  PHQ - 2 Score 4 6  Altered sleeping 2 2  Tired, decreased energy 2 2  Change in appetite 1 2  Feeling bad or failure about yourself  1 2  Trouble concentrating 0 0  Moving slowly or fidgety/restless 0 0  Suicidal thoughts 0 0   PHQ-9 Score 10 14   GAD 7 : Generalized Anxiety Score 11/27/2015 08/27/2015  Nervous, Anxious, on Edge 2 3  Control/stop worrying 1 3  Worry too much - different things 1 2  Trouble relaxing 0 0  Restless 0 1  Easily annoyed or irritable 3 3  Afraid - awful might happen 1 1  Total GAD 7 Score 8 13

## 2015-11-27 NOTE — Progress Notes (Signed)
1 hr @ 66 Patient's brother is with her today Patient needs to see Katrina Stewart due to PHQ-9

## 2015-11-27 NOTE — Progress Notes (Signed)
Subjective:  Katrina Stewart is a 20 y.o. G3P0010 at [redacted]w[redacted]d being seen today for ongoing prenatal care.  She is currently monitored for the following issues for this high-risk pregnancy and has Severe major depression, single episode, without psychotic features ([place]); Major depressive disorder, recurrent episode, severe ([place]); Alcohol use disorder ([place]); Severe episode of recurrent major depressive disorder, without psychotic features ([place]); Supervision of normal pregnancy in second trimester; Rh negative state in antepartum period; and Rubella non-immune status, antepartum on her problem list.  Patient reports Does not think the Prozac is helping as much. Was taking 40 mg prior to pregnancy but stopped with pregnancy. Was restarted at 10 mg in clinic.  Marland Kitchen  Contractions: Not present. Vag. Bleeding: None.  Movement: Present. Denies leaking of fluid.   The following portions of the patient's history were reviewed and updated as appropriate: allergies, current medications, past family history, past medical history, past social history, past surgical history and problem list. Problem list updated.  Objective:   Vitals:   11/27/15 0907  BP: 121/73  Pulse: 78  Weight: 240 lb (108.9 kg)    Fetal Status: Fetal Heart Rate (bpm): 152   Movement: Present     General:  Alert, oriented and cooperative. Patient is in no acute distress.  Skin: Skin is warm and dry. No rash noted.   Cardiovascular: Normal heart rate noted  Respiratory: Normal respiratory effort, no problems with respiration noted  Abdomen: Soft, gravid, appropriate for gestational age. Pain/Pressure: Present     Pelvic:  Cervical exam deferred        Extremities: Normal range of motion.  Edema: None  Mental Status: Normal mood and affect. Normal behavior. Normal judgment and thought content.   Urinalysis:      Assessment and Plan:  Pregnancy: G3P0010 at [redacted]w[redacted]d  1. Encounter for supervision of other normal pregnancy in second  trimester Wt gain reviewed with pt Encourage 30 minutes of walking daily and increase water intake - Glucose Tolerance, 1 HR (50g) w/o Fasting - CBC - RPR - Tdap (BOOSTRIX) injection 0.5 mL; Inject 0.5 mLs into the muscle once. - HIV antibody (with reflex) - rho (d) immune globulin (RHIG/RHOPHYLAC) injection 300 mcg; Inject 2 mLs (300 mcg total) into the muscle once.  2. Severe major depression, single episode, without psychotic features ([place]) To see Roselyn Reef LSW today Will increase Prozac to 40 mg qd  3. Rubella non-immune status, antepartum Vaccine PP  Preterm labor symptoms and general obstetric precautions including but not limited to vaginal bleeding, contractions, leaking of fluid and fetal movement were reviewed in detail with the patient. Please refer to After Visit Summary for other counseling recommendations.  Return in about 2 weeks (around 12/11/2015) for OB visit.   Chancy Milroy, MD

## 2015-11-28 LAB — GLUCOSE TOLERANCE, 1 HOUR (50G) W/O FASTING: Glucose, 1 Hr, gestational: 80 mg/dL (ref <140)

## 2015-11-28 LAB — HIV ANTIBODY (ROUTINE TESTING W REFLEX): HIV 1&2 Ab, 4th Generation: NONREACTIVE

## 2015-11-29 LAB — RPR

## 2015-12-10 ENCOUNTER — Telehealth: Payer: Self-pay

## 2015-12-10 MED ORDER — FERROUS SULFATE 325 (65 FE) MG PO TABS: 325.0000 mg | ORAL_TABLET | Freq: Every day | ORAL | 3 refills | Status: DC

## 2015-12-10 NOTE — Telephone Encounter (Signed)
Per Dr.Ervin, pt needs to start iron supplement.  Notified pt of results and the need for iron.  Pt stated understanding with no further questions.

## 2015-12-15 ENCOUNTER — Ambulatory Visit (INDEPENDENT_AMBULATORY_CARE_PROVIDER_SITE_OTHER): Payer: Medicaid Other | Admitting: Obstetrics and Gynecology

## 2015-12-15 ENCOUNTER — Telehealth: Payer: Self-pay | Admitting: Clinical

## 2015-12-15 VITALS — BP 123/76 | HR 102 | Wt 240.5 lb

## 2015-12-15 DIAGNOSIS — O99342 Other mental disorders complicating pregnancy, second trimester: Secondary | ICD-10-CM

## 2015-12-15 DIAGNOSIS — Z6791 Unspecified blood type, Rh negative: Secondary | ICD-10-CM

## 2015-12-15 DIAGNOSIS — Z3482 Encounter for supervision of other normal pregnancy, second trimester: Secondary | ICD-10-CM

## 2015-12-15 DIAGNOSIS — F332 Major depressive disorder, recurrent severe without psychotic features: Secondary | ICD-10-CM

## 2015-12-15 DIAGNOSIS — Z283 Underimmunization status: Secondary | ICD-10-CM

## 2015-12-15 DIAGNOSIS — O9989 Other specified diseases and conditions complicating pregnancy, childbirth and the puerperium: Secondary | ICD-10-CM

## 2015-12-15 DIAGNOSIS — O09899 Supervision of other high risk pregnancies, unspecified trimester: Secondary | ICD-10-CM

## 2015-12-15 DIAGNOSIS — O26899 Other specified pregnancy related conditions, unspecified trimester: Secondary | ICD-10-CM

## 2015-12-15 NOTE — Progress Notes (Signed)
Subjective:  Katrina Stewart is a 20 y.o. G3P0010 at [redacted]w[redacted]d being seen today for ongoing prenatal care.  She is currently monitored for the following issues for this high-risk pregnancy and has Severe major depression, single episode, without psychotic features ([place]); Major depressive disorder, recurrent episode, severe ([place]); Alcohol use disorder ([place]); Severe episode of recurrent major depressive disorder, without psychotic features ([place]); Supervision of normal pregnancy in second trimester; Rh negative state in antepartum period; and Rubella non-immune status, antepartum on her problem list.  Patient reports no complaints.  Contractions: Not present. Vag. Bleeding: None.  Movement: Present. Denies leaking of fluid.   The following portions of the patient's history were reviewed and updated as appropriate: allergies, current medications, past family history, past medical history, past social history, past surgical history and problem list. Problem list updated.  Objective:   Vitals:   12/15/15 0939  BP: 123/76  Pulse: (!) 102  Weight: 240 lb 8 oz (109.1 kg)    Fetal Status: Fetal Heart Rate (bpm): 140   Movement: Present     General:  Alert, oriented and cooperative. Patient is in no acute distress.  Skin: Skin is warm and dry. No rash noted.   Cardiovascular: Normal heart rate noted  Respiratory: Normal respiratory effort, no problems with respiration noted  Abdomen: Soft, gravid, appropriate for gestational age. Pain/Pressure: Present     Pelvic:  Cervical exam deferred        Extremities: Normal range of motion.  Edema: None  Mental Status: Normal mood and affect. Normal behavior. Normal judgment and thought content.   Urinalysis:      Assessment and Plan:  Pregnancy: G3P0010 at [redacted]w[redacted]d  1. Encounter for supervision of other normal pregnancy in second trimester   2. Severe episode of recurrent major depressive disorder, without psychotic features ([place]) Thinks to increase Prozac  to 40 mg is helping. To See Roselyn Reef today  3. Rh negative state in antepartum period S/P Rhogam  4. Rubella non-immune status, antepartum Vaccine PP  Preterm labor symptoms and general obstetric precautions including but not limited to vaginal bleeding, contractions, leaking of fluid and fetal movement were reviewed in detail with the patient. Please refer to After Visit Summary for other counseling recommendations.  No Follow-up on file.   Chancy Milroy, MD

## 2015-12-15 NOTE — Telephone Encounter (Signed)
Check on patient as she was unable to stay and talk to [facility] today. Pt is reminded of importance of taking iron, as prescribed, by medical provider, as low iron may be contributing to feelings of depression; she says she will take a nap and pick up iron pills at pharmacy today, and will come in to check in with [facility] Vesta Mixer, LCSWA, at her next visit.

## 2015-12-29 ENCOUNTER — Ambulatory Visit (INDEPENDENT_AMBULATORY_CARE_PROVIDER_SITE_OTHER): Payer: Medicaid Other | Admitting: Obstetrics and Gynecology

## 2015-12-29 VITALS — BP 133/68 | HR 91 | Wt 242.6 lb

## 2015-12-29 DIAGNOSIS — O99342 Other mental disorders complicating pregnancy, second trimester: Secondary | ICD-10-CM

## 2015-12-29 DIAGNOSIS — Z6791 Unspecified blood type, Rh negative: Secondary | ICD-10-CM

## 2015-12-29 DIAGNOSIS — Z3482 Encounter for supervision of other normal pregnancy, second trimester: Secondary | ICD-10-CM

## 2015-12-29 DIAGNOSIS — F322 Major depressive disorder, single episode, severe without psychotic features: Secondary | ICD-10-CM

## 2015-12-29 DIAGNOSIS — O26899 Other specified pregnancy related conditions, unspecified trimester: Secondary | ICD-10-CM

## 2015-12-29 NOTE — Progress Notes (Signed)
Prenatal Visit Note Date: 12/29/2015 Clinic: [facility]  Subjective:  Katrina Stewart is a 20 y.o. G3P0010 at [redacted]w[redacted]d being seen today for ongoing prenatal care.  She is currently monitored for the following issues for this high-risk pregnancy and has Severe major depression, single episode, without psychotic features ([place]); Major depressive disorder, recurrent episode, severe ([place]); Alcohol use disorder ([place]); Severe episode of recurrent major depressive disorder, without psychotic features ([place]); Supervision of normal pregnancy in second trimester; Rh negative state in antepartum period; and Rubella non-immune status, antepartum on her problem list.  Patient reports no complaints.   Contractions: Not present. Vag. Bleeding: None.  Movement: Present. Denies leaking of fluid.   The following portions of the patient's history were reviewed and updated as appropriate: allergies, current medications, past family history, past medical history, past social history, past surgical history and problem list. Problem list updated.  Objective:   Vitals:   12/29/15 1004  BP: 133/68  Pulse: 91  Weight: 242 lb 9.6 oz (110 kg)    Fetal Status: Fetal Heart Rate (bpm): 147   Movement: Present     General:  Alert, oriented and cooperative. Patient is in no acute distress.  Skin: Skin is warm and dry. No rash noted.   Cardiovascular: Normal heart rate noted  Respiratory: Normal respiratory effort, no problems with respiration noted  Abdomen: Soft, gravid, appropriate for gestational age. Pain/Pressure: Present     Pelvic:  Cervical exam deferred        Extremities: Normal range of motion.  Edema: None  Mental Status: Normal mood and affect. Normal behavior. Normal judgment and thought content.   Urinalysis:      Assessment and Plan:  Pregnancy: G3P0010 at [redacted]w[redacted]d  Routine PNC Mood doing well on the prozac. Pt doesn't feel like she needs to see jamie today. Screen questionnaire  number is better today.  Taking iron well  Preterm labor symptoms and general obstetric precautions including but not limited to vaginal bleeding, contractions, leaking of fluid and fetal movement were reviewed in detail with the patient. Please refer to After Visit Summary for other counseling recommendations.  Return in about 2 weeks (around 01/12/2016) for rob.   Aletha Halim, MD

## 2016-01-12 ENCOUNTER — Ambulatory Visit (INDEPENDENT_AMBULATORY_CARE_PROVIDER_SITE_OTHER): Payer: Medicaid Other | Admitting: Advanced Practice Midwife

## 2016-01-12 VITALS — BP 139/81 | HR 110 | Wt 243.4 lb

## 2016-01-12 DIAGNOSIS — O26899 Other specified pregnancy related conditions, unspecified trimester: Secondary | ICD-10-CM

## 2016-01-12 DIAGNOSIS — O09899 Supervision of other high risk pregnancies, unspecified trimester: Secondary | ICD-10-CM

## 2016-01-12 DIAGNOSIS — Z3493 Encounter for supervision of normal pregnancy, unspecified, third trimester: Secondary | ICD-10-CM

## 2016-01-12 DIAGNOSIS — Z6791 Unspecified blood type, Rh negative: Secondary | ICD-10-CM

## 2016-01-12 NOTE — Progress Notes (Signed)
   PRENATAL VISIT NOTE  Subjective:  Katrina Stewart is a 20 y.o. G3P0010 at [redacted]w[redacted]d being seen today for ongoing prenatal care.  She is currently monitored for the following issues for this low-risk pregnancy and has Severe major depression, single episode, without psychotic features ([place]); Major depressive disorder, recurrent episode, severe ([place]); Alcohol use disorder ([place]); Severe episode of recurrent major depressive disorder, without psychotic features ([place]); Supervision of normal pregnancy in second trimester; Rh negative state in antepartum period; and Rubella non-immune status, antepartum on her problem list.  Patient reports occasional contractions.  Contractions: Not present. Vag. Bleeding: None.  Movement: Present. Denies leaking of fluid.   The following portions of the patient's history were reviewed and updated as appropriate: allergies, current medications, past family history, past medical history, past social history, past surgical history and problem list. Problem list updated.  Objective:   Vitals:   01/12/16 1508  BP: 139/81  Pulse: (!) 110  Weight: 243 lb 6.4 oz (110.4 kg)    Fetal Status: Fetal Heart Rate (bpm): 152   Movement: Present     General:  Alert, oriented and cooperative. Patient is in no acute distress.  Skin: Skin is warm and dry. No rash noted.   Cardiovascular: Normal heart rate noted  Respiratory: Normal respiratory effort, no problems with respiration noted  Abdomen: Soft, gravid, appropriate for gestational age. Pain/Pressure: Present     Pelvic:  Cervical exam deferred        Extremities: Normal range of motion.  Edema: Trace  Mental Status: Normal mood and affect. Normal behavior. Normal judgment and thought content.   Assessment and Plan:  Pregnancy: G3P0010 at [redacted]w[redacted]d  1. Third trimester pregnancy      Has some pelvic pain at times, occasional BH contractions      Had one episode after urination yesterday of a little fluid coming out. Never  had any more come out. Could be urine or leukorrhea. ROM precautions reviewed  2. Rh negative state in antepartum period      Had Rhophylac in October  Preterm labor symptoms and general obstetric precautions including but not limited to vaginal bleeding, contractions, leaking of fluid and fetal movement were reviewed in detail with the patient. Please refer to After Visit Summary for other counseling recommendations.  Return in about 2 weeks (around 01/26/2016) for [facility].   Seabron Spates, CNM

## 2016-01-12 NOTE — Progress Notes (Signed)
Clear vaginal discharge

## 2016-01-12 NOTE — Patient Instructions (Signed)

## 2016-01-12 NOTE — Progress Notes (Signed)
rou

## 2016-01-28 ENCOUNTER — Ambulatory Visit (INDEPENDENT_AMBULATORY_CARE_PROVIDER_SITE_OTHER): Payer: Medicaid Other | Admitting: Obstetrics and Gynecology

## 2016-01-28 ENCOUNTER — Other Ambulatory Visit (HOSPITAL_COMMUNITY)
Admission: RE | Admit: 2016-01-28 | Discharge: 2016-01-28 | Disposition: A | Discharge status: Home / Self Care | Payer: Medicaid Other | Source: Ambulatory Visit | Attending: Obstetrics and Gynecology | Admitting: Obstetrics and Gynecology

## 2016-01-28 VITALS — BP 127/74 | HR 90 | Wt 244.0 lb

## 2016-01-28 DIAGNOSIS — O9989 Other specified diseases and conditions complicating pregnancy, childbirth and the puerperium: Secondary | ICD-10-CM

## 2016-01-28 DIAGNOSIS — Z6791 Unspecified blood type, Rh negative: Secondary | ICD-10-CM

## 2016-01-28 DIAGNOSIS — Z2839 Other underimmunization status: Secondary | ICD-10-CM

## 2016-01-28 DIAGNOSIS — O368130 Decreased fetal movements, third trimester, not applicable or unspecified: Secondary | ICD-10-CM

## 2016-01-28 DIAGNOSIS — Z113 Encounter for screening for infections with a predominantly sexual mode of transmission: Secondary | ICD-10-CM | POA: Diagnosis not present

## 2016-01-28 DIAGNOSIS — O26899 Other specified pregnancy related conditions, unspecified trimester: Secondary | ICD-10-CM

## 2016-01-28 DIAGNOSIS — Z3482 Encounter for supervision of other normal pregnancy, second trimester: Secondary | ICD-10-CM

## 2016-01-28 DIAGNOSIS — Z283 Underimmunization status: Secondary | ICD-10-CM

## 2016-01-28 LAB — OB RESULTS CONSOLE GC/CHLAMYDIA: GC PROBE AMP, GENITAL: NEGATIVE

## 2016-01-28 NOTE — Addendum Note (Signed)
Addended by: Lu Duffel L on: 01/28/2016 03:00 PM   Modules accepted: Orders

## 2016-01-28 NOTE — Progress Notes (Signed)
   PRENATAL VISIT NOTE  Subjective:  Katrina Stewart is a 20 y.o. G3P0010 at [redacted]w[redacted]d being seen today for ongoing prenatal care.  She is currently monitored for the following issues for this low-risk pregnancy and has Severe major depression, single episode, without psychotic features ([place]); Major depressive disorder, recurrent episode, severe ([place]); Alcohol use disorder ([place]); Severe episode of recurrent major depressive disorder, without psychotic features ([place]); Supervision of normal pregnancy in second trimester; Rh negative state in antepartum period; and Rubella non-immune status, antepartum on her problem list.  Patient reports decreased fetal movement.  Contractions: Irritability. Vag. Bleeding: None.  Movement: (!) Decreased. Denies leaking of fluid.   The following portions of the patient's history were reviewed and updated as appropriate: allergies, current medications, past family history, past medical history, past social history, past surgical history and problem list. Problem list updated.  Objective:   Vitals:   01/28/16 1354  BP: 127/74  Pulse: 90  Weight: 244 lb (110.7 kg)    Fetal Status: Fetal Heart Rate (bpm): 138 Fundal Height: 36 cm Movement: (!) Decreased  Presentation: Vertex  General:  Alert, oriented and cooperative. Patient is in no acute distress.  Skin: Skin is warm and dry. No rash noted.   Cardiovascular: Normal heart rate noted  Respiratory: Normal respiratory effort, no problems with respiration noted  Abdomen: Soft, gravid, appropriate for gestational age. Pain/Pressure: Present     Pelvic:  Cervical exam performed Dilation: Closed Effacement (%): Thick Station: Ballotable  Extremities: Normal range of motion.  Edema: Trace  Mental Status: Normal mood and affect. Normal behavior. Normal judgment and thought content.   Assessment and Plan:  Pregnancy: G3P0010 at [redacted]w[redacted]d  1. Encounter for supervision of other normal pregnancy in second trimester Patient  is doing well GBS bacteriuria GC/Cl cultures collected NST reviewed and reactive - GC/Chlamydia probe amp ([place])not at [facility]   2. Rh negative state in antepartum period S/p rhogam  3. Rubella non-immune status, antepartum Will offer pp  Preterm labor symptoms and general obstetric precautions including but not limited to vaginal bleeding, contractions, leaking of fluid and fetal movement were reviewed in detail with the patient. Please refer to After Visit Summary for other counseling recommendations.  Return in about 1 week (around 02/04/2016).   Mora Bellman, MD

## 2016-01-28 NOTE — Progress Notes (Signed)
Patient reports decreased FM over past 2 days, states last time she felt the baby was 9am today

## 2016-01-29 LAB — GC/CHLAMYDIA PROBE AMP (~~LOC~~) NOT AT ARMC
CHLAMYDIA, DNA PROBE: NEGATIVE
Neisseria Gonorrhea: NEGATIVE

## 2016-02-02 NOTE — L&D Delivery Note (Signed)
Delivery Note At 10:46 AM a viable female was delivered via Vaginal, Spontaneous Delivery (Presentation: vertex;LOA  ).  APGAR: 9, 9; weight  .   Placenta status: spont, shultz.  Cord:3vc  with the following complications: none.  Cord pH: n/a  Anesthesia:  epidural Episiotomy: None Lacerations: Vaginal left sidewall Suture Repair: 2.0 vicryl Est. Blood Loss (mL): 350  Mom to postpartum.  Baby to Couplet care / Skin to Skin.  Koren Shiver 02/22/2016, 11:06 AM

## 2016-02-06 ENCOUNTER — Ambulatory Visit (INDEPENDENT_AMBULATORY_CARE_PROVIDER_SITE_OTHER): Payer: Medicaid Other | Admitting: Family Medicine

## 2016-02-06 VITALS — BP 130/80 | HR 93 | Wt 247.9 lb

## 2016-02-06 DIAGNOSIS — R8271 Bacteriuria: Secondary | ICD-10-CM

## 2016-02-06 DIAGNOSIS — F322 Major depressive disorder, single episode, severe without psychotic features: Secondary | ICD-10-CM

## 2016-02-06 DIAGNOSIS — Z3482 Encounter for supervision of other normal pregnancy, second trimester: Secondary | ICD-10-CM

## 2016-02-06 NOTE — Progress Notes (Signed)
   PRENATAL VISIT NOTE  Subjective:  Katrina Stewart is a 21 y.o. G3P0010 at [redacted]w[redacted]d being seen today for ongoing prenatal care.  She is currently monitored for the following issues for this low-risk pregnancy and has Severe major depression, single episode, without psychotic features ([place]); Major depressive disorder, recurrent episode, severe ([place]); Alcohol use disorder ([place]); Severe episode of recurrent major depressive disorder, without psychotic features ([place]); Supervision of normal pregnancy in second trimester; Rh negative state in antepartum period; Rubella non-immune status, antepartum; and GBS bacteriuria on her problem list.  Patient reports no complaints.  Contractions: Irritability. Vag. Bleeding: None.  Movement: Present. Denies leaking of fluid.   The following portions of the patient's history were reviewed and updated as appropriate: allergies, current medications, past family history, past medical history, past social history, past surgical history and problem list. Problem list updated.  Objective:   Vitals:   02/06/16 0800  BP: 130/80  Pulse: 93  Weight: 247 lb 14.4 oz (112.4 kg)    Fetal Status: Fetal Heart Rate (bpm): 135 Fundal Height: 37 cm Movement: Present     General:  Alert, oriented and cooperative. Patient is in no acute distress.  Skin: Skin is warm and dry. No rash noted.   Cardiovascular: Normal heart rate noted  Respiratory: Normal respiratory effort, no problems with respiration noted  Abdomen: Soft, gravid, appropriate for gestational age. Pain/Pressure: Present     Pelvic:  Cervical exam deferred        Extremities: Normal range of motion.  Edema: Trace  Mental Status: Normal mood and affect. Normal behavior. Normal judgment and thought content.   Assessment and Plan:  Pregnancy: G3P0010 at [redacted]w[redacted]d  1. Encounter for supervision of other normal pregnancy in second trimester FHT and FH normal  2. GBS bacteriuria PCN in labor  3. Severe major  depression, single episode, without psychotic features ([place]) Prozac working well.  Term labor symptoms and general obstetric precautions including but not limited to vaginal bleeding, contractions, leaking of fluid and fetal movement were reviewed in detail with the patient. Please refer to After Visit Summary for other counseling recommendations.  Return in about 1 week (around 02/13/2016).   Truett Mainland, DO

## 2016-02-11 ENCOUNTER — Ambulatory Visit (INDEPENDENT_AMBULATORY_CARE_PROVIDER_SITE_OTHER): Payer: Medicaid Other | Admitting: Family Medicine

## 2016-02-11 ENCOUNTER — Encounter: Payer: Self-pay | Admitting: Family Medicine

## 2016-02-11 VITALS — BP 133/73 | HR 102 | Wt 247.2 lb

## 2016-02-11 DIAGNOSIS — Z3480 Encounter for supervision of other normal pregnancy, unspecified trimester: Secondary | ICD-10-CM

## 2016-02-11 DIAGNOSIS — Z6791 Unspecified blood type, Rh negative: Secondary | ICD-10-CM

## 2016-02-11 DIAGNOSIS — O26899 Other specified pregnancy related conditions, unspecified trimester: Secondary | ICD-10-CM

## 2016-02-11 DIAGNOSIS — Z3483 Encounter for supervision of other normal pregnancy, third trimester: Secondary | ICD-10-CM

## 2016-02-11 NOTE — Patient Instructions (Signed)
Third Trimester of Pregnancy The third trimester is from week 29 through week 40 (months 7 through 9). The third trimester is a time when the unborn baby (fetus) is growing rapidly. At the end of the ninth month, the fetus is about 20 inches in length and weighs 6-10 pounds. Body changes during your third trimester Your body goes through many changes during pregnancy. The changes vary from woman to woman. During the third trimester:  Your weight will continue to increase. You can expect to gain 25-35 pounds (11-16 kg) by the end of the pregnancy.  You may begin to get stretch marks on your hips, abdomen, and breasts.  You may urinate more often because the fetus is moving lower into your pelvis and pressing on your bladder.  You may develop or continue to have heartburn. This is caused by increased hormones that slow down muscles in the digestive tract.  You may develop or continue to have constipation because increased hormones slow digestion and cause the muscles that push waste through your intestines to relax.  You may develop hemorrhoids. These are swollen veins (varicose veins) in the rectum that can itch or be painful.  You may develop swollen, bulging veins (varicose veins) in your legs.  You may have increased body aches in the pelvis, back, or thighs. This is due to weight gain and increased hormones that are relaxing your joints.  You may have changes in your hair. These can include thickening of your hair, rapid growth, and changes in texture. Some women also have hair loss during or after pregnancy, or hair that feels dry or thin. Your hair will most likely return to normal after your baby is born.  Your breasts will continue to grow and they will continue to become tender. A yellow fluid (colostrum) may leak from your breasts. This is the first milk you are producing for your baby.  Your belly button may stick out.  You may notice more swelling in your hands, face, or  ankles.  You may have increased tingling or numbness in your hands, arms, and legs. The skin on your belly may also feel numb.  You may feel short of breath because of your expanding uterus.  You may have more problems sleeping. This can be caused by the size of your belly, increased need to urinate, and an increase in your body's metabolism.  You may notice the fetus "dropping," or moving lower in your abdomen.  You may have increased vaginal discharge.  Your cervix becomes thin and soft (effaced) near your due date. What to expect at prenatal visits You will have prenatal exams every 2 weeks until week 36. Then you will have weekly prenatal exams. During a routine prenatal visit:  You will be weighed to make sure you and the fetus are growing normally.  Your blood pressure will be taken.  Your abdomen will be measured to track your baby's growth.  The fetal heartbeat will be listened to.  Any test results from the previous visit will be discussed.  You may have a cervical check near your due date to see if you have effaced. At around 36 weeks, your health care provider will check your cervix. At the same time, your health care provider will also perform a test on the secretions of the vaginal tissue. This test is to determine if a type of bacteria, Group B streptococcus, is present. Your health care provider will explain this further. Your health care provider may ask you:    What your birth plan is.  How you are feeling.  If you are feeling the baby move.  If you have had any abnormal symptoms, such as leaking fluid, bleeding, severe headaches, or abdominal cramping.  If you are using any tobacco products, including cigarettes, chewing tobacco, and electronic cigarettes.  If you have any questions. Other tests or screenings that may be performed during your third trimester include:  Blood tests that check for low iron levels (anemia).  Fetal testing to check the health,  activity level, and growth of the fetus. Testing is done if you have certain medical conditions or if there are problems during the pregnancy.  Nonstress test (NST). This test checks the health of your baby to make sure there are no signs of problems, such as the baby not getting enough oxygen. During this test, a belt is placed around your belly. The baby is made to move, and its heart rate is monitored during movement. What is false labor? False labor is a condition in which you feel small, irregular tightenings of the muscles in the womb (contractions) that eventually go away. These are called Braxton Hicks contractions. Contractions may last for hours, days, or even weeks before true labor sets in. If contractions come at regular intervals, become more frequent, increase in intensity, or become painful, you should see your health care provider. What are the signs of labor?  Abdominal cramps.  Regular contractions that start at 10 minutes apart and become stronger and more frequent with time.  Contractions that start on the top of the uterus and spread down to the lower abdomen and back.  Increased pelvic pressure and dull back pain.  A watery or bloody mucus discharge that comes from the vagina.  Leaking of amniotic fluid. This is also known as your "water breaking." It could be a slow trickle or a gush. Let your doctor know if it has a color or strange odor. If you have any of these signs, call your health care provider right away, even if it is before your due date. Follow these instructions at home: Eating and drinking  Continue to eat regular, healthy meals.  Do not eat:  Raw meat or meat spreads.  Unpasteurized milk or cheese.  Unpasteurized juice.  Store-made salad.  Refrigerated smoked seafood.  Hot dogs or deli meat, unless they are piping hot.  More than 6 ounces of albacore tuna a week.  Shark, swordfish, king mackerel, or tile fish.  Store-made salads.  Raw  sprouts, such as mung bean or alfalfa sprouts.  Take prenatal vitamins as told by your health care provider.  Take 1000 mg of calcium daily as told by your health care provider.  If you develop constipation:  Take over-the-counter or prescription medicines.  Drink enough fluid to keep your urine clear or pale yellow.  Eat foods that are high in fiber, such as fresh fruits and vegetables, whole grains, and beans.  Limit foods that are high in fat and processed sugars, such as fried and sweet foods. Activity  Exercise only as directed by your health care provider. Healthy pregnant women should aim for 2 hours and 30 minutes of moderate exercise per week. If you experience any pain or discomfort while exercising, stop.  Avoid heavy lifting.  Do not exercise in extreme heat or humidity, or at high altitudes.  Wear low-heel, comfortable shoes.  Practice good posture.  Do not travel far distances unless it is absolutely necessary and only with the approval   of your health care provider.  Wear your seat belt at all times while in a car, on a bus, or on a plane.  Take frequent breaks and rest with your legs elevated if you have leg cramps or low back pain.  Do not use hot tubs, steam rooms, or saunas.  You may continue to have sex unless your health care provider tells you otherwise. Lifestyle  Do not use any products that contain nicotine or tobacco, such as cigarettes and e-cigarettes. If you need help quitting, ask your health care provider.  Do not drink alcohol.  Do not use any medicinal herbs or unprescribed drugs. These chemicals affect the formation and growth of the baby.  If you develop varicose veins:  Wear support pantyhose or compression stockings as told by your healthcare provider.  Elevate your feet for 15 minutes, 3-4 times a day.  Wear a supportive maternity bra to help with breast tenderness. General instructions  Take over-the-counter and prescription  medicines only as told by your health care provider. There are medicines that are either safe or unsafe to take during pregnancy.  Take warm sitz baths to soothe any pain or discomfort caused by hemorrhoids. Use hemorrhoid cream or witch hazel if your health care provider approves.  Avoid cat litter boxes and soil used by cats. These carry germs that can cause birth defects in the baby. If you have a cat, ask someone to clean the litter box for you.  To prepare for the arrival of your baby:  Take prenatal classes to understand, practice, and ask questions about the labor and delivery.  Make a trial run to the hospital.  Visit the hospital and tour the maternity area.  Arrange for maternity or paternity leave through employers.  Arrange for family and friends to take care of pets while you are in the hospital.  Purchase a rear-facing car seat and make sure you know how to install it in your car.  Pack your hospital bag.  Prepare the baby's nursery. Make sure to remove all pillows and stuffed animals from the baby's crib to prevent suffocation.  Visit your dentist if you have not gone during your pregnancy. Use a soft toothbrush to brush your teeth and be gentle when you floss.  Keep all prenatal follow-up visits as told by your health care provider. This is important. Contact a health care provider if:  You are unsure if you are in labor or if your water has broken.  You become dizzy.  You have mild pelvic cramps, pelvic pressure, or nagging pain in your abdominal area.  You have lower back pain.  You have persistent nausea, vomiting, or diarrhea.  You have an unusual or bad smelling vaginal discharge.  You have pain when you urinate. Get help right away if:  You have a fever.  You are leaking fluid from your vagina.  You have spotting or bleeding from your vagina.  You have severe abdominal pain or cramping.  You have rapid weight loss or weight gain.  You have  shortness of breath with chest pain.  You notice sudden or extreme swelling of your face, hands, ankles, feet, or legs.  Your baby makes fewer than 10 movements in 2 hours.  You have severe headaches that do not go away with medicine.  You have vision changes. Summary  The third trimester is from week 29 through week 40, months 7 through 9. The third trimester is a time when the unborn baby (fetus)   is growing rapidly.  During the third trimester, your discomfort may increase as you and your baby continue to gain weight. You may have abdominal, leg, and back pain, sleeping problems, and an increased need to urinate.  During the third trimester your breasts will keep growing and they will continue to become tender. A yellow fluid (colostrum) may leak from your breasts. This is the first milk you are producing for your baby.  False labor is a condition in which you feel small, irregular tightenings of the muscles in the womb (contractions) that eventually go away. These are called Braxton Hicks contractions. Contractions may last for hours, days, or even weeks before true labor sets in.  Signs of labor can include: abdominal cramps; regular contractions that start at 10 minutes apart and become stronger and more frequent with time; watery or bloody mucus discharge that comes from the vagina; increased pelvic pressure and dull back pain; and leaking of amniotic fluid. This information is not intended to replace advice given to you by your health care provider. Make sure you discuss any questions you have with your health care provider. Document Released: 01/12/2001 Document Revised: 06/26/2015 Document Reviewed: 03/21/2012 [employer] Interactive Patient Education  2017 [employer]   Breastfeeding Deciding to breastfeed is one of the best choices you can make for you and your baby. A change in hormones during pregnancy causes your breast tissue to grow and increases the number and size of your  milk ducts. These hormones also allow proteins, sugars, and fats from your blood supply to make breast milk in your milk-producing glands. Hormones prevent breast milk from being released before your baby is born as well as prompt milk flow after birth. Once breastfeeding has begun, thoughts of your baby, as well as his or her sucking or crying, can stimulate the release of milk from your milk-producing glands. Benefits of breastfeeding For Your Baby  Your first milk (colostrum) helps your baby's digestive system function better.  There are antibodies in your milk that help your baby fight off infections.  Your baby has a lower incidence of asthma, allergies, and sudden infant death syndrome.  The nutrients in breast milk are better for your baby than infant formulas and are designed uniquely for your baby's needs.  Breast milk improves your baby's brain development.  Your baby is less likely to develop other conditions, such as childhood obesity, asthma, or type 2 diabetes mellitus. For You  Breastfeeding helps to create a very special bond between you and your baby.  Breastfeeding is convenient. Breast milk is always available at the correct temperature and costs nothing.  Breastfeeding helps to burn calories and helps you lose the weight gained during pregnancy.  Breastfeeding makes your uterus contract to its prepregnancy size faster and slows bleeding (lochia) after you give birth.  Breastfeeding helps to lower your risk of developing type 2 diabetes mellitus, osteoporosis, and breast or ovarian cancer later in life. Signs that your baby is hungry Early Signs of Hunger  Increased alertness or activity.  Stretching.  Movement of the head from side to side.  Movement of the head and opening of the mouth when the corner of the mouth or cheek is stroked (rooting).  Increased sucking sounds, smacking lips, cooing, sighing, or squeaking.  Hand-to-mouth movements.  Increased  sucking of fingers or hands. Late Signs of Hunger  Fussing.  Intermittent crying. Extreme Signs of Hunger  Signs of extreme hunger will require calming and consoling before your baby will   be able to breastfeed successfully. Do not wait for the following signs of extreme hunger to occur before you initiate breastfeeding:  Restlessness.  A loud, strong cry.  Screaming. Breastfeeding basics  Breastfeeding Initiation  Find a comfortable place to sit or lie down, with your neck and back well supported.  Place a pillow or rolled up blanket under your baby to bring him or her to the level of your breast (if you are seated). Nursing pillows are specially designed to help support your arms and your baby while you breastfeed.  Make sure that your baby's abdomen is facing your abdomen.  Gently massage your breast. With your fingertips, massage from your chest wall toward your nipple in a circular motion. This encourages milk flow. You may need to continue this action during the feeding if your milk flows slowly.  Support your breast with 4 fingers underneath and your thumb above your nipple. Make sure your fingers are well away from your nipple and your baby's mouth.  Stroke your baby's lips gently with your finger or nipple.  When your baby's mouth is open wide enough, quickly bring your baby to your breast, placing your entire nipple and as much of the colored area around your nipple (areola) as possible into your baby's mouth.  More areola should be visible above your baby's upper lip than below the lower lip.  Your baby's tongue should be between his or her lower gum and your breast.  Ensure that your baby's mouth is correctly positioned around your nipple (latched). Your baby's lips should create a seal on your breast and be turned out (everted).  It is common for your baby to suck about 2-3 minutes in order to start the flow of breast milk. Latching  Teaching your baby how to latch  on to your breast properly is very important. An improper latch can cause nipple pain and decreased milk supply for you and poor weight gain in your baby. Also, if your baby is not latched onto your nipple properly, he or she may swallow some air during feeding. This can make your baby fussy. Burping your baby when you switch breasts during the feeding can help to get rid of the air. However, teaching your baby to latch on properly is still the best way to prevent fussiness from swallowing air while breastfeeding. Signs that your baby has successfully latched on to your nipple:  Silent tugging or silent sucking, without causing you pain.  Swallowing heard between every 3-4 sucks.  Muscle movement above and in front of his or her ears while sucking. Signs that your baby has not successfully latched on to nipple:  Sucking sounds or smacking sounds from your baby while breastfeeding.  Nipple pain. If you think your baby has not latched on correctly, slip your finger into the corner of your baby's mouth to break the suction and place it between your baby's gums. Attempt breastfeeding initiation again. Signs of Successful Breastfeeding  Signs from your baby:  A gradual decrease in the number of sucks or complete cessation of sucking.  Falling asleep.  Relaxation of his or her body.  Retention of a small amount of milk in his or her mouth.  Letting go of your breast by himself or herself. Signs from you:  Breasts that have increased in firmness, weight, and size 1-3 hours after feeding.  Breasts that are softer immediately after breastfeeding.  Increased milk volume, as well as a change in milk consistency and color by   the fifth day of breastfeeding.  Nipples that are not sore, cracked, or bleeding. Signs That Your Baby is Getting Enough Milk  Wetting at least 1-2 diapers during the first 24 hours after birth.  Wetting at least 5-6 diapers every 24 hours for the first week after  birth. The urine should be clear or pale yellow by 5 days after birth.  Wetting 6-8 diapers every 24 hours as your baby continues to grow and develop.  At least 3 stools in a 24-hour period by age 5 days. The stool should be soft and yellow.  At least 3 stools in a 24-hour period by age 7 days. The stool should be seedy and yellow.  No loss of weight greater than 10% of birth weight during the first 3 days of age.  Average weight gain of 4-7 ounces (113-198 g) per week after age 4 days.  Consistent daily weight gain by age 5 days, without weight loss after the age of 2 weeks. After a feeding, your baby may spit up a small amount. This is common. Breastfeeding frequency and duration Frequent feeding will help you make more milk and can prevent sore nipples and breast engorgement. Breastfeed when you feel the need to reduce the fullness of your breasts or when your baby shows signs of hunger. This is called "breastfeeding on demand." Avoid introducing a pacifier to your baby while you are working to establish breastfeeding (the first 4-6 weeks after your baby is born). After this time you may choose to use a pacifier. Research has shown that pacifier use during the first year of a baby's life decreases the risk of sudden infant death syndrome (SIDS). Allow your baby to feed on each breast as long as he or she wants. Breastfeed until your baby is finished feeding. When your baby unlatches or falls asleep while feeding from the first breast, offer the second breast. Because newborns are often sleepy in the first few weeks of life, you may need to awaken your baby to get him or her to feed. Breastfeeding times will vary from baby to baby. However, the following rules can serve as a guide to help you ensure that your baby is properly fed:  Newborns (babies 4 weeks of age or younger) may breastfeed every 1-3 hours.  Newborns should not go longer than 3 hours during the day or 5 hours during the night  without breastfeeding.  You should breastfeed your baby a minimum of 8 times in a 24-hour period until you begin to introduce solid foods to your baby at around 6 months of age. Breast milk pumping Pumping and storing breast milk allows you to ensure that your baby is exclusively fed your breast milk, even at times when you are unable to breastfeed. This is especially important if you are going back to work while you are still breastfeeding or when you are not able to be present during feedings. Your lactation consultant can give you guidelines on how long it is safe to store breast milk. A breast pump is a machine that allows you to pump milk from your breast into a sterile bottle. The pumped breast milk can then be stored in a refrigerator or freezer. Some breast pumps are operated by hand, while others use electricity. Ask your lactation consultant which type will work best for you. Breast pumps can be purchased, but some hospitals and breastfeeding support groups lease breast pumps on a monthly basis. A lactation consultant can teach you how to   hand express breast milk, if you prefer not to use a pump. Caring for your breasts while you breastfeed Nipples can become dry, cracked, and sore while breastfeeding. The following recommendations can help keep your breasts moisturized and healthy:  Avoid using soap on your nipples.  Wear a supportive bra. Although not required, special nursing bras and tank tops are designed to allow access to your breasts for breastfeeding without taking off your entire bra or top. Avoid wearing underwire-style bras or extremely tight bras.  Air dry your nipples for 3-4minutes after each feeding.  Use only cotton bra pads to absorb leaked breast milk. Leaking of breast milk between feedings is normal.  Use lanolin on your nipples after breastfeeding. Lanolin helps to maintain your skin's normal moisture barrier. If you use pure lanolin, you do not need to wash it off  before feeding your baby again. Pure lanolin is not toxic to your baby. You may also hand express a few drops of breast milk and gently massage that milk into your nipples and allow the milk to air dry. In the first few weeks after giving birth, some women experience extremely full breasts (engorgement). Engorgement can make your breasts feel heavy, warm, and tender to the touch. Engorgement peaks within 3-5 days after you give birth. The following recommendations can help ease engorgement:  Completely empty your breasts while breastfeeding or pumping. You may want to start by applying warm, moist heat (in the shower or with warm water-soaked hand towels) just before feeding or pumping. This increases circulation and helps the milk flow. If your baby does not completely empty your breasts while breastfeeding, pump any extra milk after he or she is finished.  Wear a snug bra (nursing or regular) or tank top for 1-2 days to signal your body to slightly decrease milk production.  Apply ice packs to your breasts, unless this is too uncomfortable for you.  Make sure that your baby is latched on and positioned properly while breastfeeding. If engorgement persists after 48 hours of following these recommendations, contact your health care provider or a lactation consultant. Overall health care recommendations while breastfeeding  Eat healthy foods. Alternate between meals and snacks, eating 3 of each per day. Because what you eat affects your breast milk, some of the foods may make your baby more irritable than usual. Avoid eating these foods if you are sure that they are negatively affecting your baby.  Drink milk, fruit juice, and water to satisfy your thirst (about 10 glasses a day).  Rest often, relax, and continue to take your prenatal vitamins to prevent fatigue, stress, and anemia.  Continue breast self-awareness checks.  Avoid chewing and smoking tobacco. Chemicals from cigarettes that pass  into breast milk and exposure to secondhand smoke may harm your baby.  Avoid alcohol and drug use, including marijuana. Some medicines that may be harmful to your baby can pass through breast milk. It is important to ask your health care provider before taking any medicine, including all over-the-counter and prescription medicine as well as vitamin and herbal supplements. It is possible to become pregnant while breastfeeding. If birth control is desired, ask your health care provider about options that will be safe for your baby. Contact a health care provider if:  You feel like you want to stop breastfeeding or have become frustrated with breastfeeding.  You have painful breasts or nipples.  Your nipples are cracked or bleeding.  Your breasts are red, tender, or warm.  You have   a swollen area on either breast.  You have a fever or chills.  You have nausea or vomiting.  You have drainage other than breast milk from your nipples.  Your breasts do not become full before feedings by the fifth day after you give birth.  You feel sad and depressed.  Your baby is too sleepy to eat well.  Your baby is having trouble sleeping.  Your baby is wetting less than 3 diapers in a 24-hour period.  Your baby has less than 3 stools in a 24-hour period.  Your baby's skin or the white part of his or her eyes becomes yellow.  Your baby is not gaining weight by 5 days of age. Get help right away if:  Your baby is overly tired (lethargic) and does not want to wake up and feed.  Your baby develops an unexplained fever. This information is not intended to replace advice given to you by your health care provider. Make sure you discuss any questions you have with your health care provider. Document Released: 01/18/2005 Document Revised: 07/02/2015 Document Reviewed: 07/12/2012 Elsevier Interactive Patient Education  2017 Elsevier Inc.  

## 2016-02-11 NOTE — Progress Notes (Signed)
   PRENATAL VISIT NOTE  Subjective:  Katrina Stewart is a 21 y.o. G3P0010 at [redacted]w[redacted]d being seen today for ongoing prenatal care.  She is currently monitored for the following issues for this low-risk pregnancy and has Alcohol use disorder ([place]); Severe episode of recurrent major depressive disorder, without psychotic features ([place]); Supervision of normal pregnancy, antepartum; Rh negative state in antepartum period; Rubella non-immune status, antepartum; and GBS bacteriuria on her problem list.  Patient reports no complaints.  Contractions: Irritability. Vag. Bleeding: None.  Movement: Present. Denies leaking of fluid.   The following portions of the patient's history were reviewed and updated as appropriate: allergies, current medications, past family history, past medical history, past social history, past surgical history and problem list. Problem list updated.  Objective:   Vitals:   02/11/16 1535  BP: 133/73  Pulse: (!) 102  Weight: 247 lb 3.2 oz (112.1 kg)    Fetal Status: Fetal Heart Rate (bpm): 152 Fundal Height: 38 cm Movement: Present  Presentation: Vertex  General:  Alert, oriented and cooperative. Patient is in no acute distress.  Skin: Skin is warm and dry. No rash noted.   Cardiovascular: Normal heart rate noted  Respiratory: Normal respiratory effort, no problems with respiration noted  Abdomen: Soft, gravid, appropriate for gestational age. Pain/Pressure: Present     Pelvic:  Cervical exam deferred        Extremities: Normal range of motion.  Edema: Trace  Mental Status: Normal mood and affect. Normal behavior. Normal judgment and thought content.   Assessment and Plan:  Pregnancy: G3P0010 at [redacted]w[redacted]d  1. Supervision of other normal pregnancy, antepartum Labor precautions reviewed  2. Rh negative state in antepartum period S/p Rhogam  Term labor symptoms and general obstetric precautions including but not limited to vaginal bleeding, contractions, leaking of fluid  and fetal movement were reviewed in detail with the patient. Please refer to After Visit Summary for other counseling recommendations.  Return in 1 week (on 02/18/2016).   Donnamae Jude, MD

## 2016-02-18 ENCOUNTER — Encounter: Payer: Self-pay | Admitting: Family Medicine

## 2016-02-20 ENCOUNTER — Encounter (HOSPITAL_COMMUNITY): Payer: Self-pay | Admitting: *Deleted

## 2016-02-20 ENCOUNTER — Ambulatory Visit (INDEPENDENT_AMBULATORY_CARE_PROVIDER_SITE_OTHER): Payer: Medicaid Other | Admitting: Family Medicine

## 2016-02-20 VITALS — BP 131/75 | HR 97 | Wt 252.0 lb

## 2016-02-20 DIAGNOSIS — Z3480 Encounter for supervision of other normal pregnancy, unspecified trimester: Secondary | ICD-10-CM

## 2016-02-20 DIAGNOSIS — Z3483 Encounter for supervision of other normal pregnancy, third trimester: Secondary | ICD-10-CM

## 2016-02-20 NOTE — Progress Notes (Signed)
   PRENATAL VISIT NOTE  Subjective:  Katrina Stewart is a 21 y.o. G3P0010 at [redacted]w[redacted]d being seen today for ongoing prenatal care.  She is currently monitored for the following issues for this low-risk pregnancy and has Alcohol use disorder ([place]); Severe episode of recurrent major depressive disorder, without psychotic features ([place]); Supervision of normal pregnancy, antepartum; Rh negative state in antepartum period; Rubella non-immune status, antepartum; and GBS bacteriuria on her problem list.  Patient reports occasional contractions.  Contractions: Not present. Vag. Bleeding: None.  Movement: Present. Denies leaking of fluid.   The following portions of the patient's history were reviewed and updated as appropriate: allergies, current medications, past family history, past medical history, past social history, past surgical history and problem list. Problem list updated.  Objective:   Vitals:   02/20/16 0800  BP: 131/75  Pulse: 97  Weight: 252 lb (114.3 kg)    Fetal Status: Fetal Heart Rate (bpm): 147 Fundal Height: 39 cm Movement: Present  Presentation: Vertex  General:  Alert, oriented and cooperative. Patient is in no acute distress.  Skin: Skin is warm and dry. No rash noted.   Cardiovascular: Normal heart rate noted  Respiratory: Normal respiratory effort, no problems with respiration noted  Abdomen: Soft, gravid, appropriate for gestational age. Pain/Pressure: Present     Pelvic:  Cervical exam performed Dilation: 1 Effacement (%): Thick Station: Ballotable  Extremities: Normal range of motion.     Mental Status: Normal mood and affect. Normal behavior. Normal judgment and thought content.   Assessment and Plan:  Pregnancy: G3P0010 at [redacted]w[redacted]d  1. Supervision of other normal pregnancy, antepartum FHT and FH normal. Schedule induction at 41 weeks. Follow up in 1 week with NST/AFI   Term labor symptoms and general obstetric precautions including but not limited to vaginal  bleeding, contractions, leaking of fluid and fetal movement were reviewed in detail with the patient. Please refer to After Visit Summary for other counseling recommendations.  Return in about 1 week (around 02/27/2016) for OB f/u.   Truett Mainland, DO

## 2016-02-21 ENCOUNTER — Encounter (HOSPITAL_COMMUNITY): Payer: Self-pay

## 2016-02-21 ENCOUNTER — Inpatient Hospital Stay (HOSPITAL_COMMUNITY)
Admission: AD | Admit: 2016-02-21 | Discharge: 2016-02-24 | DRG: 775 | Disposition: A | Discharge status: Home / Self Care | Payer: Medicaid Other | Source: Ambulatory Visit | Attending: Obstetrics & Gynecology | Admitting: Obstetrics & Gynecology

## 2016-02-21 ENCOUNTER — Inpatient Hospital Stay (HOSPITAL_COMMUNITY)
Admission: AD | Admit: 2016-02-21 | Discharge: 2016-02-21 | Disposition: A | Discharge status: Home / Self Care | Payer: Medicaid Other | Source: Ambulatory Visit | Attending: Obstetrics and Gynecology | Admitting: Obstetrics and Gynecology

## 2016-02-21 DIAGNOSIS — Z3A40 40 weeks gestation of pregnancy: Secondary | ICD-10-CM

## 2016-02-21 DIAGNOSIS — Z3480 Encounter for supervision of other normal pregnancy, unspecified trimester: Secondary | ICD-10-CM

## 2016-02-21 DIAGNOSIS — O99214 Obesity complicating childbirth: Secondary | ICD-10-CM | POA: Diagnosis present

## 2016-02-21 DIAGNOSIS — O9962 Diseases of the digestive system complicating childbirth: Secondary | ICD-10-CM | POA: Diagnosis present

## 2016-02-21 DIAGNOSIS — K219 Gastro-esophageal reflux disease without esophagitis: Secondary | ICD-10-CM | POA: Diagnosis present

## 2016-02-21 DIAGNOSIS — Z6839 Body mass index (BMI) 39.0-39.9, adult: Secondary | ICD-10-CM

## 2016-02-21 DIAGNOSIS — O99344 Other mental disorders complicating childbirth: Secondary | ICD-10-CM | POA: Diagnosis present

## 2016-02-21 DIAGNOSIS — O26893 Other specified pregnancy related conditions, third trimester: Secondary | ICD-10-CM | POA: Diagnosis present

## 2016-02-21 DIAGNOSIS — O9989 Other specified diseases and conditions complicating pregnancy, childbirth and the puerperium: Secondary | ICD-10-CM

## 2016-02-21 DIAGNOSIS — O26899 Other specified pregnancy related conditions, unspecified trimester: Secondary | ICD-10-CM

## 2016-02-21 DIAGNOSIS — O09899 Supervision of other high risk pregnancies, unspecified trimester: Secondary | ICD-10-CM

## 2016-02-21 DIAGNOSIS — F332 Major depressive disorder, recurrent severe without psychotic features: Secondary | ICD-10-CM | POA: Diagnosis present

## 2016-02-21 DIAGNOSIS — O134 Gestational [pregnancy-induced] hypertension without significant proteinuria, complicating childbirth: Secondary | ICD-10-CM | POA: Diagnosis present

## 2016-02-21 DIAGNOSIS — Z283 Underimmunization status: Secondary | ICD-10-CM

## 2016-02-21 DIAGNOSIS — Z6791 Unspecified blood type, Rh negative: Secondary | ICD-10-CM

## 2016-02-21 DIAGNOSIS — Z3493 Encounter for supervision of normal pregnancy, unspecified, third trimester: Secondary | ICD-10-CM

## 2016-02-21 DIAGNOSIS — O99824 Streptococcus B carrier state complicating childbirth: Secondary | ICD-10-CM | POA: Diagnosis present

## 2016-02-21 NOTE — MAU Note (Signed)
Left her around 10am, got worse between 6-8 pm, contractions every 3-5 min. No leaking. No bleeding. BAby moving well.

## 2016-02-21 NOTE — MAU Note (Signed)
Contractions since last night, now every 5 minutes apart, denies vaginal bleeding, membranes were stripped yesterday 1.5 cm

## 2016-02-22 ENCOUNTER — Encounter (HOSPITAL_COMMUNITY): Payer: Self-pay

## 2016-02-22 ENCOUNTER — Inpatient Hospital Stay (HOSPITAL_COMMUNITY): Payer: Medicaid Other | Admitting: Anesthesiology

## 2016-02-22 DIAGNOSIS — Z3A40 40 weeks gestation of pregnancy: Secondary | ICD-10-CM | POA: Diagnosis not present

## 2016-02-22 DIAGNOSIS — K219 Gastro-esophageal reflux disease without esophagitis: Secondary | ICD-10-CM | POA: Diagnosis present

## 2016-02-22 DIAGNOSIS — Z6839 Body mass index (BMI) 39.0-39.9, adult: Secondary | ICD-10-CM | POA: Diagnosis not present

## 2016-02-22 DIAGNOSIS — O9962 Diseases of the digestive system complicating childbirth: Secondary | ICD-10-CM | POA: Diagnosis present

## 2016-02-22 DIAGNOSIS — O99824 Streptococcus B carrier state complicating childbirth: Secondary | ICD-10-CM | POA: Diagnosis present

## 2016-02-22 DIAGNOSIS — Z6791 Unspecified blood type, Rh negative: Secondary | ICD-10-CM | POA: Diagnosis not present

## 2016-02-22 DIAGNOSIS — O99214 Obesity complicating childbirth: Secondary | ICD-10-CM | POA: Diagnosis present

## 2016-02-22 DIAGNOSIS — Z3493 Encounter for supervision of normal pregnancy, unspecified, third trimester: Secondary | ICD-10-CM | POA: Diagnosis present

## 2016-02-22 DIAGNOSIS — O134 Gestational [pregnancy-induced] hypertension without significant proteinuria, complicating childbirth: Secondary | ICD-10-CM | POA: Diagnosis present

## 2016-02-22 DIAGNOSIS — O26893 Other specified pregnancy related conditions, third trimester: Secondary | ICD-10-CM | POA: Diagnosis present

## 2016-02-22 DIAGNOSIS — O99344 Other mental disorders complicating childbirth: Secondary | ICD-10-CM | POA: Diagnosis present

## 2016-02-22 DIAGNOSIS — F332 Major depressive disorder, recurrent severe without psychotic features: Secondary | ICD-10-CM | POA: Diagnosis present

## 2016-02-22 LAB — COMPREHENSIVE METABOLIC PANEL
ALBUMIN: 3.3 g/dL — AB (ref 3.5–5.0)
ALT: 12 U/L — ABNORMAL LOW (ref 14–54)
AST: 23 U/L (ref 15–41)
Alkaline Phosphatase: 193 U/L — ABNORMAL HIGH (ref 38–126)
Anion gap: 8 (ref 5–15)
BUN: 10 mg/dL (ref 6–20)
CALCIUM: 9.3 mg/dL (ref 8.9–10.3)
CHLORIDE: 108 mmol/L (ref 101–111)
CO2: 20 mmol/L — AB (ref 22–32)
CREATININE: 0.6 mg/dL (ref 0.44–1.00)
GFR calc Af Amer: >60 mL/min (ref >60)
GLUCOSE: 79 mg/dL (ref 65–99)
POTASSIUM: 4.1 mmol/L (ref 3.5–5.1)
SODIUM: 136 mmol/L (ref 135–145)
TOTAL PROTEIN: 7.1 g/dL (ref 6.5–8.1)
Total Bilirubin: 0.5 mg/dL (ref 0.3–1.2)

## 2016-02-22 LAB — TYPE AND SCREEN
ABO/RH(D): B NEG
ANTIBODY SCREEN: NEGATIVE

## 2016-02-22 LAB — PROTEIN / CREATININE RATIO, URINE
CREATININE, URINE: 108 mg/dL
Protein Creatinine Ratio: 0.15 mg/mg{Cre} (ref 0.00–0.15)
TOTAL PROTEIN, URINE: 16 mg/dL

## 2016-02-22 LAB — CBC
HCT: 32.9 % — ABNORMAL LOW (ref 36.0–46.0)
Hemoglobin: 10.7 g/dL — ABNORMAL LOW (ref 12.0–15.0)
MCH: 21.8 pg — ABNORMAL LOW (ref 26.0–34.0)
MCHC: 32.5 g/dL (ref 30.0–36.0)
MCV: 67 fL — AB (ref 78.0–100.0)
PLATELETS: 265 10*3/uL (ref 150–400)
RBC: 4.91 MIL/uL (ref 3.87–5.11)
RDW: 16.4 % — AB (ref 11.5–15.5)
WBC: 10.3 10*3/uL (ref 4.0–10.5)

## 2016-02-22 LAB — RPR: RPR Ser Ql: NONREACTIVE

## 2016-02-22 LAB — OB RESULTS CONSOLE GBS: GBS: POSITIVE

## 2016-02-22 MED ORDER — SENNOSIDES-DOCUSATE SODIUM 8.6-50 MG PO TABS
2.0000 | ORAL_TABLET | ORAL | Status: DC
  Administered 2016-02-22 – 2016-02-23 (×2): 2 via ORAL
  Filled 2016-02-22 (×2): qty 2

## 2016-02-22 MED ORDER — LACTATED RINGERS IV SOLN: 500.0000 mL | Freq: Once | INTRAVENOUS | Status: DC

## 2016-02-22 MED ORDER — BENZOCAINE-MENTHOL 20-0.5 % EX AERO
1.0000 "application " | INHALATION_SPRAY | CUTANEOUS | Status: DC | PRN
  Administered 2016-02-22: 1 via TOPICAL
  Filled 2016-02-22: qty 56

## 2016-02-22 MED ORDER — METHYLERGONOVINE MALEATE 0.2 MG/ML IJ SOLN
0.2000 mg | Freq: Once | INTRAMUSCULAR | Status: AC
  Administered 2016-02-22: 0.2 mg via INTRAMUSCULAR

## 2016-02-22 MED ORDER — TETANUS-DIPHTH-ACELL PERTUSSIS 5-2.5-18.5 LF-MCG/0.5 IM SUSP: 0.5000 mL | Freq: Once | INTRAMUSCULAR | Status: DC

## 2016-02-22 MED ORDER — SOD CITRATE-CITRIC ACID 500-334 MG/5ML PO SOLN: 30.0000 mL | ORAL | Status: DC | PRN

## 2016-02-22 MED ORDER — METHYLERGONOVINE MALEATE 0.2 MG/ML IJ SOLN
INTRAMUSCULAR | Status: AC
  Administered 2016-02-22: 0.2 mg via INTRAMUSCULAR
  Filled 2016-02-22: qty 1

## 2016-02-22 MED ORDER — SODIUM CHLORIDE 0.9 % IV SOLN: 250.0000 mL | INTRAVENOUS | Status: DC | PRN

## 2016-02-22 MED ORDER — LIDOCAINE HCL (PF) 1 % IJ SOLN
30.0000 mL | INTRAMUSCULAR | Status: DC | PRN
  Filled 2016-02-22: qty 30

## 2016-02-22 MED ORDER — PHENYLEPHRINE 40 MCG/ML (10ML) SYRINGE FOR IV PUSH (FOR BLOOD PRESSURE SUPPORT)
PREFILLED_SYRINGE | INTRAVENOUS | Status: AC
  Filled 2016-02-22: qty 20

## 2016-02-22 MED ORDER — SODIUM CHLORIDE 0.9% FLUSH: 3.0000 mL | INTRAVENOUS | Status: DC | PRN

## 2016-02-22 MED ORDER — ONDANSETRON HCL 4 MG/2ML IJ SOLN: 4.0000 mg | INTRAMUSCULAR | Status: DC | PRN

## 2016-02-22 MED ORDER — PENICILLIN G POTASSIUM 5000000 UNITS IJ SOLR
5.0000 10*6.[IU] | Freq: Once | INTRAVENOUS | Status: AC
  Administered 2016-02-22: 5 10*6.[IU] via INTRAVENOUS
  Filled 2016-02-22: qty 5

## 2016-02-22 MED ORDER — DIBUCAINE 1 % RE OINT: 1.0000 "application " | TOPICAL_OINTMENT | RECTAL | Status: DC | PRN

## 2016-02-22 MED ORDER — PENICILLIN G POT IN DEXTROSE 60000 UNIT/ML IV SOLN
3.0000 10*6.[IU] | INTRAVENOUS | Status: DC
  Administered 2016-02-22 (×2): 3 10*6.[IU] via INTRAVENOUS
  Filled 2016-02-22 (×6): qty 50

## 2016-02-22 MED ORDER — EPHEDRINE 5 MG/ML INJ
10.0000 mg | INTRAVENOUS | Status: DC | PRN
  Filled 2016-02-22: qty 4

## 2016-02-22 MED ORDER — OXYCODONE-ACETAMINOPHEN 5-325 MG PO TABS: 2.0000 | ORAL_TABLET | ORAL | Status: DC | PRN

## 2016-02-22 MED ORDER — PRENATAL MULTIVITAMIN CH
1.0000 | ORAL_TABLET | Freq: Every day | ORAL | Status: DC
  Administered 2016-02-23 – 2016-02-24 (×2): 1 via ORAL
  Filled 2016-02-22 (×2): qty 1

## 2016-02-22 MED ORDER — LACTATED RINGERS IV SOLN: 500.0000 mL | INTRAVENOUS | Status: DC | PRN

## 2016-02-22 MED ORDER — OXYTOCIN BOLUS FROM INFUSION: 500.0000 mL | Freq: Once | INTRAVENOUS | Status: DC

## 2016-02-22 MED ORDER — OXYCODONE-ACETAMINOPHEN 5-325 MG PO TABS
1.0000 | ORAL_TABLET | ORAL | Status: DC | PRN
Start: 2016-02-22 — End: 2016-02-22

## 2016-02-22 MED ORDER — FENTANYL CITRATE (PF) 100 MCG/2ML IJ SOLN
50.0000 ug | INTRAMUSCULAR | Status: DC | PRN
  Administered 2016-02-22 (×3): 75 ug via INTRAVENOUS
  Filled 2016-02-22 (×3): qty 2

## 2016-02-22 MED ORDER — SIMETHICONE 80 MG PO CHEW: 80.0000 mg | CHEWABLE_TABLET | ORAL | Status: DC | PRN

## 2016-02-22 MED ORDER — ACETAMINOPHEN 325 MG PO TABS: 650.0000 mg | ORAL_TABLET | ORAL | Status: DC | PRN

## 2016-02-22 MED ORDER — FLEET ENEMA 7-19 GM/118ML RE ENEM: 1.0000 | ENEMA | RECTAL | Status: DC | PRN

## 2016-02-22 MED ORDER — OXYTOCIN 40 UNITS IN LACTATED RINGERS INFUSION - SIMPLE MED
2.5000 [IU]/h | INTRAVENOUS | Status: DC
  Administered 2016-02-22: 2.5 [IU]/h via INTRAVENOUS
  Filled 2016-02-22: qty 1000

## 2016-02-22 MED ORDER — ZOLPIDEM TARTRATE 5 MG PO TABS: 5.0000 mg | ORAL_TABLET | Freq: Every evening | ORAL | Status: DC | PRN

## 2016-02-22 MED ORDER — LIDOCAINE HCL (PF) 1 % IJ SOLN
INTRAMUSCULAR | Status: DC | PRN
  Administered 2016-02-22 (×2): 4 mL via EPIDURAL

## 2016-02-22 MED ORDER — PHENYLEPHRINE 40 MCG/ML (10ML) SYRINGE FOR IV PUSH (FOR BLOOD PRESSURE SUPPORT)
80.0000 ug | PREFILLED_SYRINGE | INTRAVENOUS | Status: DC | PRN
  Filled 2016-02-22: qty 5

## 2016-02-22 MED ORDER — ACETAMINOPHEN 325 MG PO TABS
650.0000 mg | ORAL_TABLET | ORAL | Status: DC | PRN
  Administered 2016-02-23 – 2016-02-24 (×3): 650 mg via ORAL
  Filled 2016-02-22 (×3): qty 2

## 2016-02-22 MED ORDER — IBUPROFEN 600 MG PO TABS
600.0000 mg | ORAL_TABLET | Freq: Four times a day (QID) | ORAL | Status: DC
  Administered 2016-02-22 – 2016-02-24 (×8): 600 mg via ORAL
  Filled 2016-02-22 (×8): qty 1

## 2016-02-22 MED ORDER — ONDANSETRON HCL 4 MG PO TABS
4.0000 mg | ORAL_TABLET | ORAL | Status: DC | PRN
  Administered 2016-02-22: 4 mg via ORAL
  Filled 2016-02-22: qty 1

## 2016-02-22 MED ORDER — LACTATED RINGERS IV SOLN
INTRAVENOUS | Status: DC
  Administered 2016-02-22 (×2): via INTRAVENOUS

## 2016-02-22 MED ORDER — WITCH HAZEL-GLYCERIN EX PADS
1.0000 | MEDICATED_PAD | CUTANEOUS | Status: DC | PRN
Start: 2016-02-22 — End: 2016-02-24

## 2016-02-22 MED ORDER — DIPHENHYDRAMINE HCL 50 MG/ML IJ SOLN: 12.5000 mg | INTRAMUSCULAR | Status: DC | PRN

## 2016-02-22 MED ORDER — COCONUT OIL OIL: 1.0000 "application " | TOPICAL_OIL | Status: DC | PRN

## 2016-02-22 MED ORDER — SODIUM CHLORIDE 0.9% FLUSH: 3.0000 mL | Freq: Two times a day (BID) | INTRAVENOUS | Status: DC

## 2016-02-22 MED ORDER — DIPHENHYDRAMINE HCL 25 MG PO CAPS: 25.0000 mg | ORAL_CAPSULE | Freq: Four times a day (QID) | ORAL | Status: DC | PRN

## 2016-02-22 MED ORDER — FENTANYL 2.5 MCG/ML BUPIVACAINE 1/10 % EPIDURAL INFUSION (WH - ANES)
INTRAMUSCULAR | Status: AC
  Administered 2016-02-22: 14 mL/h via EPIDURAL
  Filled 2016-02-22: qty 100

## 2016-02-22 MED ORDER — FENTANYL 2.5 MCG/ML BUPIVACAINE 1/10 % EPIDURAL INFUSION (WH - ANES)
14.0000 mL/h | INTRAMUSCULAR | Status: DC | PRN
  Administered 2016-02-22 (×2): 14 mL/h via EPIDURAL

## 2016-02-22 MED ORDER — MEASLES, MUMPS & RUBELLA VAC ~~LOC~~ INJ
0.5000 mL | INJECTION | Freq: Once | SUBCUTANEOUS | Status: AC
  Administered 2016-02-24: 0.5 mL via SUBCUTANEOUS
  Filled 2016-02-22 (×3): qty 0.5

## 2016-02-22 MED ORDER — ONDANSETRON HCL 4 MG/2ML IJ SOLN: 4.0000 mg | Freq: Four times a day (QID) | INTRAMUSCULAR | Status: DC | PRN

## 2016-02-22 NOTE — Anesthesia Postprocedure Evaluation (Signed)
Anesthesia Post Note  Patient: Katrina Stewart  Procedure(s) Performed: * No procedures listed *  Patient location during evaluation: Mother Baby Anesthesia Type: Epidural Level of consciousness: awake Pain management: pain level controlled Vital Signs Assessment: post-procedure vital signs reviewed and stable Respiratory status: spontaneous breathing Cardiovascular status: stable Postop Assessment: no headache, no backache, epidural receding, patient able to bend at knees, no signs of nausea or vomiting and adequate PO intake Anesthetic complications: no        Last Vitals:  Vitals:   02/22/16 1357 02/22/16 1500  BP: 130/67 134/64  Pulse: (!) 102 (!) 113  Resp: 19 20  Temp: 37.1 C 36.4 C    Last Pain:  Vitals:   02/22/16 1500  TempSrc: Oral  PainSc: 0-No pain   Pain Goal: Patients Stated Pain Goal: 5 (02/22/16 0315)               Barkley Boards

## 2016-02-22 NOTE — Progress Notes (Signed)
Pt resting comfortably

## 2016-02-22 NOTE — Progress Notes (Signed)
Katrina Stewart is a 21 y.o. G3P0010 at [redacted]w[redacted]d  admitted for active labor  Subjective: PT IS tolerating labor adequately , supported by pt's mother. Will desire epidural. Intensity of contractions has not changed   Objective: BP 136/80 (BP Location: Right Arm)   Pulse 97   Temp 98.7 F (37.1 C) (Oral)   Resp 20   Ht 5\' 8"  (1.727 m)   Wt 116.1 kg (256 lb)   LMP 05/15/2015 (Approximate)   SpO2 100%   BMI 38.92 kg/m  No intake/output data recorded. Total I/O In: 533.3 [I.V.:533.3] Out: -   FHT:  FHR: 135 bpm, variability: moderate,  accelerations:  Present,  decelerations:  Absent UC:   irregular, every 4-6 minutes SVE:   Dilation: 5 Effacement (%): 90 Station: -1 (bulging bag) Exam by:: Straight RN   Labs: Lab Results  Component Value Date   WBC 10.3 02/21/2016   HGB 10.7 (L) 02/21/2016   HCT 32.9 (L) 02/21/2016   MCV 67.0 (L) 02/21/2016   PLT 265 02/21/2016    Assessment / Plan: Protracted latent phase  Labor: AROM, will assess in an hour and consider IUPC/pitocin. Preeclampsia:   Fetal Wellbeing:  Category I Pain Control:  Epidural to be placed soon I/D:  n/a Anticipated MOD:  NSVD  Johnnie Moten V 02/22/2016, 6:28 AM

## 2016-02-22 NOTE — Lactation Note (Signed)
This note was copied from a baby's chart. Lactation Consultation Note Initial visit at 8 hours of age.  Mom reports a few good feedings and now baby has been sleepy for several hours. LC changed dirty diaper with stool and void, set on counter for collection. [place] assisted mom with hand expression.  Mom has large breast with large flat nipples.  Left nipple everts some with stimulation.  Several drops of clear colostrum noted bilaterally.   Baby awakened with fist to mouth. LC assisted with STS in football hold on left breast.  Baby opens mouth wide, doesn't suck and then gags X 2 attempts.  Baby then asleep STS with mom. Mom reports having a DEBP for home use and will call [facility] for appointment. [facility] LC resources given and discussed.  Encouraged to feed with early cues on demand.  Early newborn behavior discussed.  Mom to call for assist as needed.  Report given to RN at bedside.    Patient Name: Katrina Stewart [id number] Date: 02/22/2016 Reason for consult: Initial assessment   Maternal Data Has patient been taught Hand Expression?: Yes  Feeding Feeding Type: Breast Fed  LATCH Score/Interventions Latch: Too sleepy or reluctant, no latch achieved, no sucking elicited. Intervention(s): Skin to skin;Teach feeding cues;Waking techniques  Audible Swallowing: None  Type of Nipple: Flat Intervention(s): No intervention needed  Comfort (Breast/Nipple): Soft / non-tender     Hold (Positioning): Assistance needed to correctly position infant at breast and maintain latch. Intervention(s): Breastfeeding basics reviewed;Support Pillows;Position options;Skin to skin  LATCH Score: 4  Lactation Tools Discussed/Used WIC Program: Yes   Consult Status Consult Status: Follow-up Date: 02/23/16 Follow-up type: In-patient    Kendell Bane Justine Null 02/22/2016, 7:38 PM

## 2016-02-22 NOTE — Anesthesia Preprocedure Evaluation (Signed)
Anesthesia Evaluation  Patient identified by MRN, date of birth, ID band Patient awake    Reviewed: Allergy & Precautions, NPO status , Patient's Chart, lab work & pertinent test results  Airway Mallampati: III       Dental no notable dental hx. (+) Teeth Intact   Pulmonary neg pulmonary ROS,    Pulmonary exam normal breath sounds clear to auscultation       Cardiovascular negative cardio ROS Normal cardiovascular exam Rhythm:Regular Rate:Normal     Neuro/Psych PSYCHIATRIC DISORDERS Anxiety Depression negative neurological ROS     GI/Hepatic Neg liver ROS, GERD  ,  Endo/Other  Morbid obesity  Renal/GU negative Renal ROS  negative genitourinary   Musculoskeletal negative musculoskeletal ROS (+)   Abdominal (+) + obese,   Peds  Hematology  (+) anemia ,   Anesthesia Other Findings   Reproductive/Obstetrics (+) Pregnancy                             Anesthesia Physical Anesthesia Plan  ASA: III  Anesthesia Plan: Epidural   Post-op Pain Management:    Induction:   Airway Management Planned: Natural Airway  Additional Equipment:   Intra-op Plan:   Post-operative Plan:   Informed Consent: I have reviewed the patients History and Physical, chart, labs and discussed the procedure including the risks, benefits and alternatives for the proposed anesthesia with the patient or authorized representative who has indicated his/her understanding and acceptance.     Plan Discussed with: Anesthesiologist  Anesthesia Plan Comments:         Anesthesia Quick Evaluation

## 2016-02-22 NOTE — Progress Notes (Signed)
Old healed linear scars noted on inner left forearm.

## 2016-02-22 NOTE — H&P (Signed)
LABOR AND DELIVERY ADMISSION HISTORY AND PHYSICAL NOTE  Katrina Stewart is a 21 y.o. female G3P0010 with IUP at 33w1dby 6 wks [place] for [place].   She reports positive fetal movement. She denies leakage of fluid or vaginal bleeding.  Patient initially had two elevated SBPs in 140s when arrived in MAU, no signs/symptoms of preE, labs drawn and are pending (platelets normal). Will follow up when they result, BPs have been normal since the first two taken in MAU.   Prenatal History/Complications:  Past Medical History: Past Medical History:  Diagnosis Date  . Anxiety   . Depression     Past Surgical History: Past Surgical History:  Procedure Laterality Date  . BREAST SURGERY    . MASS EXCISION  10/29/2011   Procedure: EXCISION MASS;  Surgeon: DHarl Bowie MD;  Location: [facility]S;  Service: General;  Laterality: Bilateral;  excision of bilateral breast masses    Obstetrical History: OB History    Gravida Para Term Preterm AB Living   3 0     1     SAB TAB Ectopic Multiple Live Births   1 0            Social History: Social History   Social History  . Marital status: Single    Spouse name: N/A  . Number of children: N/A  . Years of education: N/A   Social History Main Topics  . Smoking status: Never Smoker  . Smokeless tobacco: Never Used  . Alcohol use 1.2 - 1.8 oz/week    2 - 3 Shots of liquor per week     Comment: none since pregnancy  . Drug use: Yes    Types: Marijuana     Comment: none in pregnancy  . Sexual activity: Yes    Birth control/ protection: None   Other Topics Concern  . None   Social History Narrative  . None    Family History: Family History  Problem Relation Age of Onset  . Cancer Maternal Grandmother     lung cancer  . Cancer Other     bladder cancer    Allergies: No Known Allergies  Prescriptions Prior to Admission  Medication Sig Dispense Refill Last Dose  . acetaminophen (TYLENOL) 500 MG tablet Take 1,000 mg  by mouth every 6 (six) hours as needed for moderate pain.   02/20/2016 at Unknown time  . ferrous sulfate (FERROUSUL) 325 (65 FE) MG tablet Take 1 tablet (325 mg total) by mouth daily with breakfast. 30 tablet 3 02/20/2016 at Unknown time  . FLUoxetine (PROZAC) 40 MG capsule Take 1 capsule (40 mg total) by mouth daily. 30 capsule 3 02/20/2016 at Unknown time  . hydrocortisone-pramoxine (PROCTOFOAM HC) rectal foam Place 1 applicator rectally 2 (two) times daily. 10 g 2 Past Week at Unknown time  . Prenat-FeFum-FePo-FA-Omega 3 (CONCEPT DHA) 53.5-38-1 MG CAPS Take 1 tablet by mouth daily. 30 capsule 2 02/20/2016 at Unknown time     Review of Systems   All systems reviewed and negative except as stated in HPI  Blood pressure 134/87, pulse 97, temperature 99.1 F (37.3 C), temperature source Oral, resp. rate 18, height _0  (1.727 m), weight 256 lb (116.1 kg), last menstrual period 05/15/2015, SpO2 100 %. General appearance: alert, cooperative, appears stated age and no distress Lungs: clear to auscultation bilaterally Heart: regular rate and rhythm Abdomen: soft, non-tender; bowel sounds normal Extremities: No calf swelling or tenderness Presentation: cephalic Fetal monitoring: 120 bpm, mod var +  accels, no decels Uterine activity: Irregular Dilation: 5 (4.5-5 cm) Effacement (%): 90 Station: -2 Exam by:: Katrina Rossetti RN   Prenatal labs: ABO, Rh: --/--/B NEG (08/09 2043) Antibody: NEG (08/09 2043) Rubella: !Error! RPR: NON REAC (10/26 1009)  HBsAg: NEGATIVE (07/26 1121)  HIV: NONREACTIVE (10/26 1009)  GBS:    1 hr Glucola: 80 Genetic screening:  Quad normal Anatomy US: Normal, female  Prenatal Transfer Tool  Maternal Diabetes: No Genetic Screening: Normal Maternal Ultrasounds/Referrals: Normal Fetal Ultrasounds or other Referrals:  None Maternal Substance Abuse:  Yes:  Type: Other:  Alcohol (history of) Significant Maternal Medications:  Meds include: Prozac Significant Maternal  Lab Results: Lab values include: Group B Strep positive, Rh negative, Other: Rubella non-immune  Results for orders placed or performed during the hospital encounter of 02/21/16 (from the past 24 hour(s))  Protein / creatinine ratio, urine   Collection Time: 02/21/16 11:26 PM  Result Value Ref Range   Creatinine, Urine 108.00 mg/dL   Total Protein, Urine 16 mg/dL   Protein Creatinine Ratio 0.15 0.00 - 0.15 mg/mg[Cre]  CBC   Collection Time: 02/21/16 11:34 PM  Result Value Ref Range   WBC 10.3 4.0 - 10.5 K/uL   RBC 4.91 3.87 - 5.11 MIL/uL   Hemoglobin 10.7 (L) 12.0 - 15.0 g/dL   HCT 32.9 (L) 36.0 - 46.0 %   MCV 67.0 (L) 78.0 - 100.0 fL   MCH 21.8 (L) 26.0 - 34.0 pg   MCHC 32.5 30.0 - 36.0 g/dL   RDW 16.4 (H) 11.5 - 15.5 %   Platelets 265 150 - 400 K/uL    Patient Active Problem List   Diagnosis Date Noted  . GBS bacteriuria 02/06/2016  . Rh negative state in antepartum period 09/03/2015  . Rubella non-immune status, antepartum 09/03/2015  . Supervision of normal pregnancy, antepartum 08/31/2015  . Severe episode of recurrent major depressive disorder, without psychotic features ()   . Alcohol use disorder ([place])     Assessment: Katrina Stewart is a 21 y.o. G3P0010 at 14w1dhere for labor.  #Labor:SOL #Pain: Epidural on request, IV pain meds #FWB: Cat I #ID:  GBS Pos (in urine) - PCN;  Rubella non-immune - MMR postpartum; Rh Neg - Rhogam PP if needed #MOF: Bottle #MOC:Undecided #Circ:  Yes, outpatient  EKatherine Basset DO [place] [facility]1/21/2018, 12:23 AM

## 2016-02-22 NOTE — Anesthesia Procedure Notes (Signed)
Epidural Patient location during procedure: OB Start time: 02/22/2016 6:58 AM  Staffing Anesthesiologist: Josephine Igo Performed: anesthesiologist   Preanesthetic Checklist Completed: patient identified, site marked, surgical consent, pre-op evaluation, timeout performed, IV checked, risks and benefits discussed and monitors and equipment checked  Epidural Patient position: sitting Prep: site prepped and draped and DuraPrep Patient monitoring: continuous pulse ox and blood pressure Approach: midline Location: L3-L4 Injection technique: LOR air  Needle:  Needle type: Tuohy  Needle gauge: 17 G Needle length: 9 cm and 9 Needle insertion depth: 7 cm Catheter type: closed end flexible Catheter size: 19 Gauge Catheter at skin depth: 12 cm Test dose: negative and Other  Assessment Events: blood not aspirated, injection not painful, no injection resistance, negative IV test and no paresthesia  Additional Notes Patient identified. Risks and benefits discussed including failed block, incomplete  Pain control, post dural puncture headache, nerve damage, paralysis, blood pressure Changes, nausea, vomiting, reactions to medications-both toxic and allergic and post Partum back pain. All questions were answered. Patient expressed understanding and wished to proceed. Sterile technique was used throughout procedure. Epidural site was Dressed with sterile barrier dressing. No paresthesias, signs of intravascular injection Or signs of intrathecal spread were encountered.  Patient was more comfortable after the epidural was dosed. Please see RN's note for documentation of vital signs and FHR which are stable.

## 2016-02-23 MED ORDER — RHO D IMMUNE GLOBULIN 1500 UNIT/2ML IJ SOSY
300.0000 ug | PREFILLED_SYRINGE | Freq: Once | INTRAMUSCULAR | Status: AC
  Administered 2016-02-23: 300 ug via INTRAVENOUS
  Filled 2016-02-23: qty 2

## 2016-02-23 NOTE — Lactation Note (Signed)
This note was copied from a baby's chart. Lactation Consultation Note  Patient Name: Katrina Stewart [id number] Date: 02/23/2016 Reason for consult: Follow-up assessment Baby at 29 hr of life. Upon entry baby was sleeping in Dad's arms. Mom stated baby has been latching better today. She denies breast or nipple pain, voiced no concerns. Offered to help with latch and mom stated she would call "the next time he eats". She has lactation number.   Maternal Data    Feeding Feeding Type: Breast Fed Length of feed: 17 min  LATCH Score/Interventions Latch: Grasps breast easily, tongue down, lips flanged, rhythmical sucking.  Audible Swallowing: Spontaneous and intermittent  Type of Nipple: Everted at rest and after stimulation  Comfort (Breast/Nipple): Soft / non-tender     Hold (Positioning): Assistance needed to correctly position infant at breast and maintain latch.  LATCH Score: 9  Lactation Tools Discussed/Used     Consult Status Consult Status: Follow-up Date: 02/24/16 Follow-up type: In-patient    Denzil Hughes 02/23/2016, 3:52 PM

## 2016-02-23 NOTE — Clinical Social Work Maternal (Signed)
CLINICAL SOCIAL WORK MATERNAL/CHILD NOTE  Patient Details  Name: Katrina Stewart MRN: [id number] Date of Birth: 10/07/1995  Date:  Mar 14, 2016  Clinical Social Worker Initiating Note:  Laurey Arrow Date/ Time Initiated:  02/23/16/1355     Child's Name:  Tora Kindred   Legal Guardian:  Mother (FOB is Roma Kayser (last name unknown).)   Need for Interpreter:  None   Date of Referral:  15-May-2016     Reason for Referral:  Behavioral Health Issues, including SI , Current Substance Use/Substance Use During Pregnancy    Referral Source:  Central Nursery   Address:  [address] [place] [address]  Phone number:  [phone]   Household Members:  Self, Parents, Siblings   Natural Supports (not living in the home):  Friends   Professional Supports: None   Employment: Unemployed   Type of Work:     Education:  Database administrator Resources:  [employer]   Other Resources:  [employer], Physicist, medical    Cultural/Religious Considerations Which May Impact Care:  None Reported  Strengths:  Ability to meet basic needs , Engineer, materials , Home prepared for child , Understanding of illness   Risk Factors/Current Problems:  Mental Health Concerns    Cognitive State:  Alert , Goal Oriented , Insightful , Linear Thinking    Mood/Affect:  Bright , Happy , Comfortable , Interested    CSW Assessment: CSW met with MOB to complete an assessment for a consult for MH and substance abuse hx. When CSW arrived, MOB was in bed and MOB's brother Hildred Alamin) was relaxing in the recliner.   MOB gave CSW permission to continue to the assessment while MOB's brother was present. MOB was polite and receptive to meeting with CSW.  CSW inquired about MOB's substance abuse hx, and MOB acknowledged the use of prior to MOB's pregnancy confirmation. MOB denied utilizing any substances during pregnancy. CSW made MOB aware of the hospital's policy and procedure as it relates  to substance use; CSW made MOB aware of the two screenings for the infant. MOB was understanding and again admitted to utilizing marijuana prior to pregnancy. CSW thanked MOB for being honest, and informed MOB that the infant had a negative UDS and CSW will monitor the infant's cord and will make a report to [facility] CPS if the results are positive without an explanation. MOB was understanding and did not have any questions or concerns. CSW offered resources and referrals for SA treatment and MOB declined.   CSW inquired about MOB's MH hx and MOB acknowledged a hx of anxiety and depression.  MOB communicated that MOB was dx around age 50/13.  MOB has been consistent with medication management prior to MOB's pregnancy. MOB reported that MOB discontinued the use of medication in hopes of having a healthier pregnancy and baby.   MOB stated that MOB's symptoms have been minium with the help of outpatient therapy at [facility]. MOB reportedthat MOB is and established patient with the [facility], and meets with MOB's therapist PRN.  CSW educated MOB about PPD and informed MOB of possible supports and interventions to decrease PPD.  CSW also encouraged MOB to seek medical attention if needed for increased signs and symptoms for PPD. MOB communicated that she is familiar with behavior health resources and she is not afraid to ask for seek help.   CSW reviewed safe sleep and SIDS and MOB asked and responded appropriately.  CSW thanked MOB for meeting  CSW.  CSW provided MOB with CSW contact information and was encouraged to contact CSW if any questions or concerns arise.    CSW Plan/Description:  Information/Referral to [employer] , [employer] , No Further Intervention Required/No Barriers to Discharge (CSW will monitor CDS and will make a report to CPS if needed. )    Katrina Kirn D BOYD-GILYARD, LCSW 02/23/2016, 1:58 PM

## 2016-02-23 NOTE — Progress Notes (Signed)
Post Partum Day 1 Subjective: no complaints, up ad lib, voiding and tolerating PO  Objective: Blood pressure 121/80, pulse 93, temperature 98 F (36.7 C), resp. rate 18, height 5\' 8"  (1.727 m), weight 256 lb (116.1 kg), last menstrual period 05/15/2015, SpO2 100 %, unknown if currently breastfeeding.  Physical Exam:  General: alert, cooperative, appears stated age and no distress Lochia: appropriate Uterine Fundus: firm Incision: n/a DVT Evaluation: No evidence of DVT seen on physical exam.   Recent Labs  02/21/16 2334  HGB 10.7*  HCT 32.9*    Assessment/Plan: Plan for discharge tomorrow   LOS: 1 day   Koren Shiver 02/23/2016, 7:17 AM

## 2016-02-23 NOTE — Progress Notes (Signed)
UR chart review completed.  

## 2016-02-23 NOTE — Lactation Note (Signed)
This note was copied from a baby's chart. Lactation Consultation Note Mom having difficulty latching. Mom stated BF wasn't going well at all. Having difficulty latching and keeping baby on the breast. Mom has large pendulum breast w/large flat nipples. Very compressible nipples and areolas. Encouraged to roll nipples in finger tips to latch easier. Latched in football position. Encouraged to do chin tug if needed.  Asked mom what her plans were for feeding plans when she went home. Mom stated that she was probably going to do formula feeding only. Asked mom why, mom stated it was hard and baby wasn't doing well. Mom had asked for formula prior to [place] [place] coming into rm. Discussed BF and formula, LEAD.  Encouraged to call for assistance or questions.    Patient Name: Katrina Stewart [id number] Date: 02/23/2016 Reason for consult: Follow-up assessment;Difficult latch;Infant weight loss   Maternal Data    Feeding Feeding Type: Breast Fed Length of feed: 10 min (still BF)  LATCH Score/Interventions Latch: Repeated attempts needed to sustain latch, nipple held in mouth throughout feeding, stimulation needed to elicit sucking reflex. Intervention(s): Skin to skin;Teach feeding cues;Waking techniques Intervention(s): Adjust position;Assist with latch;Breast massage;Breast compression  Audible Swallowing: A few with stimulation Intervention(s): Skin to skin;Hand expression  Type of Nipple: Flat Intervention(s): Hand pump;Shells  Comfort (Breast/Nipple): Soft / non-tender     Hold (Positioning): Assistance needed to correctly position infant at breast and maintain latch. Intervention(s): Breastfeeding basics reviewed;Support Pillows;Position options;Skin to skin  LATCH Score: 6  Lactation Tools Discussed/Used Tools: Shells;Pump Shell Type: Inverted Breast pump type: Manual Pump Review: Setup, frequency, and cleaning;Milk Storage Initiated by:: Allayne Stack RN IBCLC Date initiated::  02/23/16   Consult Status Consult Status: Follow-up Date: 02/23/16 (in pm) Follow-up type: In-patient    Theodoro Kalata 02/23/2016, 2:49 AM

## 2016-02-24 LAB — RH IG WORKUP (INCLUDES ABO/RH)
ABO/RH(D): B NEG
Fetal Screen: NEGATIVE
GESTATIONAL AGE(WKS): 40.1
Unit division: 0

## 2016-02-24 MED ORDER — IBUPROFEN 600 MG PO TABS: 600.0000 mg | ORAL_TABLET | Freq: Four times a day (QID) | ORAL | 0 refills | Status: DC

## 2016-02-24 MED ORDER — SENNOSIDES-DOCUSATE SODIUM 8.6-50 MG PO TABS: 2.0000 | ORAL_TABLET | ORAL | 0 refills | Status: DC

## 2016-02-24 NOTE — Lactation Note (Addendum)
This note was copied from a baby's chart. Lactation Consultation Note  Patient Name: Katrina Stewart [id number] Date: 02/24/2016 Reason for consult: Follow-up assessment;Infant weight loss   Follow up with mom of 21 hour old infant. Infant mostly BF with 2 bottle feeds of 15 and 19 cc, 1 voids and 3 stool in last 24 hours. Infant weight 7 lb 0.2 oz with 8% weight loss since birth. LATCH Scores 6-9.  Mom reports BF is good at times and not so good at others. She is having some difficulty latching infant at times. Mom with large compressible breasts and areola. Colostrum is easily expressible from both breasts. Nipples are short shafted and evert more with stim. Mom reports she is having some nipple tenderness to the right breast at the beginning of the feeding that improves with feeding. Enc her to apply EBM and coconut or olive oil to nipples post BF.   Infant was cueing to feed, assisted mom in latching infant to left breast in the football hold. Mom initially latched infant very shallowly and reported it was tender, assisted her to latch infant deeper and mom reports feeding felt much better. Infant fed for 20 minutes and then fell asleep. Infant needed some stimulation to maintain suckling but fed well with intermittent swallows. Enc mom to massage/compress breast with feeding, infant noted to have increased swallows with breast compression. Infant off to sleep post BF.   Showed mom how to hand express and mom was able to return demonstration. Infant was spoon fed 1 cc colostrum and tolerated it well. Mom has a hand pump and was shown how to use and discussed cleaning. Enc mom to pump and hand express if infant will not latch and to feed infant EBM first via spoon or bottle and then give formula as needed per supplement guidelines. Discussed supply and demand and milk coming to volume and enc mom to always offer breast first if she is planning to give formula. Mom and GM voiced understanding.    Reviewed I/O, BF basics, positioning, Engorgement prevention/treatment, I/O, supplemental feeding amounts,  and breast milk handling and storage. [facility] Brochure reviewed, mom aware of OP Services, BF Support Groups and [place] phone #. Infant with follow up ped appt tomorrow afternoon. Mom is to call [facility] and make appt. Enc mom to call LC with any questions/concerns prn. Report to [place].    Maternal Data Formula Feeding for Exclusion: No Has patient been taught Hand Expression?: Yes Does the patient have breastfeeding experience prior to this delivery?: No  Feeding Feeding Type: Breast Fed Length of feed: 20 min  LATCH Score/Interventions Latch: Grasps breast easily, tongue down, lips flanged, rhythmical sucking. Intervention(s): Skin to skin;Teach feeding cues;Waking techniques Intervention(s): Adjust position;Assist with latch;Breast massage;Breast compression  Audible Swallowing: Spontaneous and intermittent Intervention(s): Skin to skin;Hand expression;Alternate breast massage  Type of Nipple: Everted at rest and after stimulation  Comfort (Breast/Nipple): Soft / non-tender     Hold (Positioning): Assistance needed to correctly position infant at breast and maintain latch. Intervention(s): Breastfeeding basics reviewed;Support Pillows;Position options;Skin to skin  LATCH Score: 9  Lactation Tools Discussed/Used WIC Program: Yes   Consult Status Consult Status: Complete Date: 02/24/16 Follow-up type: Call as needed    Katrina Stewart 02/24/2016, 11:05 AM

## 2016-02-24 NOTE — Discharge Summary (Signed)
OB Discharge Summary     Patient Name: Katrina Stewart DOB: [birth year] MRN: [id number]  Date of admission: 02/21/2016 Delivering MD: Koren Shiver D   Date of discharge: 02/24/2016  Admitting diagnosis: 35 WKS, BACK PAIN Intrauterine pregnancy: [redacted]w[redacted]d     Secondary diagnosis:  Principal Problem:   SVD (spontaneous vaginal delivery) Active Problems:   Normal labor  Additional problems:  Rubella non-immune Need for rhogam post partum GBS Positive      Discharge diagnosis: Term Pregnancy Delivered and Gestational Hypertension                                                                                                Post partum procedures:rhogam  Augmentation: AROM  Complications: None  Hospital course:  Onset of Labor With Vaginal Delivery     21 y.o. yo G3P1011 at [redacted]w[redacted]d was admitted in Active Labor on 02/21/2016. Patient had an uncomplicated labor course as follows:  Membrane Rupture Time/Date: 6:15 AM ,02/22/2016   Intrapartum Procedures: Episiotomy: None [1]                                         Lacerations:  Vaginal [6]  Patient had a delivery of a Viable infant. 02/22/2016  Information for the patient's newborn:  Malissa, Lundquist [id number]  Delivery Method: Vaginal, Spontaneous Delivery (Filed from Delivery Summary)    Pateint had an uncomplicated postpartum course.  She is ambulating, tolerating a regular diet, passing flatus, and urinating well. Patient is discharged home in stable condition on 02/24/16.   Physical exam  Vitals:   02/22/16 2323 02/23/16 0558 02/23/16 1857 02/24/16 0500  BP: (!) 124/53 121/80 (!) 111/54 (!) 111/59  Pulse: 98 93 98 92  Resp: 18 18 18 18   Temp: 98.4 F (36.9 C) 98 F (36.7 C) 98.4 F (36.9 C) 98 F (36.7 C)  TempSrc: Oral  Oral Oral  SpO2:      Weight:      Height:       General: alert, cooperative and no distress Lochia: appropriate Uterine Fundus: firm Incision: N/A DVT Evaluation: No evidence of DVT seen  on physical exam. No significant calf/ankle edema. Labs: Lab Results  Component Value Date   WBC 10.3 02/21/2016   HGB 10.7 (L) 02/21/2016   HCT 32.9 (L) 02/21/2016   MCV 67.0 (L) 02/21/2016   PLT 265 02/21/2016   CMP Latest Ref Rng & Units 02/21/2016  Glucose 65 - 99 mg/dL 79  BUN 6 - 20 mg/dL 10  Creatinine 0.44 - 1.00 mg/dL 0.60  Sodium 135 - 145 mmol/L 136  Potassium 3.5 - 5.1 mmol/L 4.1  Chloride 101 - 111 mmol/L 108  CO2 22 - 32 mmol/L 20(L)  Calcium 8.9 - 10.3 mg/dL 9.3  Total Protein 6.5 - 8.1 g/dL 7.1  Total Bilirubin 0.3 - 1.2 mg/dL 0.5  Alkaline Phos 38 - 126 U/L 193(H)  AST 15 - 41 U/L 23  ALT 14 - 54 U/L 12(L)    Discharge  instruction: per After Visit Summary and "Baby and Me Booklet".  After visit meds:  Allergies as of 02/24/2016   No Known Allergies     Medication List    TAKE these medications   acetaminophen 500 MG tablet Commonly known as:  TYLENOL Take 1,000 mg by mouth every 6 (six) hours as needed for moderate pain.   CONCEPT DHA 53.5-38-1 MG Caps Take 1 tablet by mouth daily.   ferrous sulfate 325 (65 FE) MG tablet Commonly known as:  FERROUSUL Take 1 tablet (325 mg total) by mouth daily with breakfast.   FLUoxetine 40 MG capsule Commonly known as:  PROZAC Take 1 capsule (40 mg total) by mouth daily.   hydrocortisone-pramoxine rectal foam Commonly known as:  PROCTOFOAM HC Place 1 applicator rectally 2 (two) times daily.   ibuprofen 600 MG tablet Commonly known as:  ADVIL,MOTRIN Take 1 tablet (600 mg total) by mouth every 6 (six) hours.   senna-docusate 8.6-50 MG tablet Commonly known as:  Senokot-S Take 2 tablets by mouth daily. Start taking on:  02/25/2016       Diet: routine diet  Activity: Advance as tolerated. Pelvic rest for 6 weeks.   Outpatient follow up:6 weeks Follow up Appt:No future appointments. Follow up Visit:No Follow-up on file.  Postpartum contraception: Undecided  Newborn Data: Live born female  Birth  Weight: 7 lb 9.3 oz (3440 g) APGAR: 9, 9  Baby Feeding: Bottle Disposition:home with mother   02/24/2016 Crissie Sickles, MD

## 2016-02-24 NOTE — Lactation Note (Addendum)
This note was copied from a baby's chart. Lactation Consultation Note Baby BF when [place] entered rm. In football position. Mom stating BF going well. Denies painful latch. Baby had wide flange on the breast. Heard occasional swallows. Baby had 8% weight loss in 38 hrs. 4 voids, 7 stools. Encouraged strict I&O until d/c home. Encouraged to massage breast at intervals while BF. Mom is breast/formula, has used formula once d/t cluster feeding. Reviewed supply and demand.  Praised mom, encouraged mom to call for assistance or questions.  Patient Name: Katrina Stewart [id number] Date: 02/24/2016 Reason for consult: Follow-up assessment;Infant weight loss   Maternal Data    Feeding Feeding Type: Breast Fed Length of feed: 20 min  LATCH Score/Interventions Latch: Grasps breast easily, tongue down, lips flanged, rhythmical sucking. Intervention(s): Breast massage;Breast compression  Audible Swallowing: A few with stimulation Intervention(s): Hand expression;Skin to skin  Type of Nipple: Everted at rest and after stimulation  Comfort (Breast/Nipple): Soft / non-tender     Hold (Positioning): No assistance needed to correctly position infant at breast.  LATCH Score: 9  Lactation Tools Discussed/Used     Consult Status Consult Status: Follow-up Date: 02/24/16 (in pm) Follow-up type: In-patient    Taneesha Edgin, Elta Guadeloupe 02/24/2016, 3:03 AM

## 2016-02-24 NOTE — Discharge Instructions (Signed)

## 2016-02-25 ENCOUNTER — Encounter: Payer: Self-pay | Admitting: Obstetrics and Gynecology

## 2016-02-27 ENCOUNTER — Other Ambulatory Visit: Payer: Self-pay | Admitting: Obstetrics & Gynecology

## 2016-02-28 ENCOUNTER — Inpatient Hospital Stay (HOSPITAL_COMMUNITY): Admission: RE | Admit: 2016-02-28 | Payer: Medicaid Other | Source: Ambulatory Visit

## 2016-03-11 ENCOUNTER — Telehealth: Payer: Self-pay | Admitting: General Practice

## 2016-03-11 NOTE — Telephone Encounter (Signed)
Fayne Mediate a postpartum nurse from [facility] went out to patient's home yesterday. She delivered on 1/21. She is calling to inform us that her Flavia Shipper score was a 13. She states the patient has been in our office several times & spoke with Roselyn Reef. She is already on prozac. Patient did answer question stating she does want to drink/use drugs. Patient is awaiting appt with [place]. Patient has scheduled pp visit on 3/7.

## 2016-04-07 ENCOUNTER — Ambulatory Visit (INDEPENDENT_AMBULATORY_CARE_PROVIDER_SITE_OTHER): Payer: Medicaid Other | Admitting: Advanced Practice Midwife

## 2016-04-07 ENCOUNTER — Ambulatory Visit (INDEPENDENT_AMBULATORY_CARE_PROVIDER_SITE_OTHER): Payer: Medicaid Other | Admitting: Clinical

## 2016-04-07 ENCOUNTER — Encounter: Payer: Self-pay | Admitting: Advanced Practice Midwife

## 2016-04-07 VITALS — BP 117/69 | HR 84 | Ht 68.0 in | Wt 237.5 lb

## 2016-04-07 DIAGNOSIS — Z30430 Encounter for insertion of intrauterine contraceptive device: Secondary | ICD-10-CM

## 2016-04-07 DIAGNOSIS — F3341 Major depressive disorder, recurrent, in partial remission: Secondary | ICD-10-CM | POA: Diagnosis not present

## 2016-04-07 LAB — POCT PREGNANCY, URINE: PREG TEST UR: NEGATIVE

## 2016-04-07 MED ORDER — LEVONORGESTREL 18.6 MCG/DAY IU IUD
INTRAUTERINE_SYSTEM | Freq: Once | INTRAUTERINE | Status: AC
  Administered 2016-04-07: 1 via INTRAUTERINE

## 2016-04-07 NOTE — BH Specialist Note (Signed)
Integrated Behavioral Health Follow Up Visit  MRN: [id number] Name: Katrina Stewart   Session Start time: 2:30 Session End time: 3:15 Total time: 45 minutes Number of Integrated Behavioral Health Clinician visits: 3/10  Type of Service: [place] Interpretor:No. Interpretor Name and Language: n/a   Warm Hand Off Completed.       SUBJECTIVE: Katrina Stewart is a 21 y.o. female accompanied by self. Patient was referred by Hansel Feinstein, CNM for depression. Patient reports the following symptoms/concerns: Pt states that she still feels depressed, some anxiety over baby crying and lack of sleep, uncertain that Prozac is working, and admits she does not take daily; also dealing with recent loss of family member and friend's stillborn.  Pt is coping by attending routine therapy at [facility], using relaxation techniques with therapist, plans to re-establish care with [facility] for PCP. Pt says "thoughts of harm" are not concerning to her, as they are fleeting and feels safe, but open to putting together safety plan for herself. Duration of problem: 6 months; Severity of problem: severe  OBJECTIVE: Mood: Depressed and Affect: Depressed Risk of harm to self or others: Suicidal ideation, "thoughts in the past but no plan", no current suicide ideation    LIFE CONTEXT: Family and Social: Lives with mom, baby, 2 brothers(19,13); pt feels well-supported by family School/Work: Plans to go back to work at an undetermined time Self-Care: IT trainer Life Changes: Childbirth  GOALS ADDRESSED: Patient will reduce symptoms of: anxiety and depression and increase knowledge and/or ability of: coping skills and also: Increase healthy adjustment to current life circumstances  INTERVENTIONS: Supportive Counseling and Psychoeducation and/or Health Education Standardized Assessments completed: PHQ 9  ASSESSMENT: Patient currently experiencing Recurrent major  depressive disorder with partial remission. Patient may benefit from psychoeducation and supportive counseling, along with contract for safety.  PLAN: 1. Follow up with behavioral health clinician on : As needed 2. Behavioral recommendations:  -Take Contract for Safety home today, discuss with mother -Take BH meds, as prescribed -Continue with [place] appointments with therapist Lylan -Re-establish with [facility] for PCP care -Continue to do daily relaxation techniques, per therapist Lylan at [facility] -Consider daily walks for additional self-care 3. Referral(s): [place] (In Clinic) 4. "From scale of 1-10, how likely are you to follow plan?": 9  Garlan Fair, LCSWA  Depression screen [facility] 2/9 04/07/2016 02/20/2016 02/06/2016 01/12/2016 12/29/2015  Decreased Interest 1 1 1 1 1   Down, Depressed, Hopeless 1 1 1 1 1   PHQ - 2 Score 2 2 2 2 2   Altered sleeping 1 1 1 1 1   Tired, decreased energy 1 1 1 1 1   Change in appetite 1 1 1 1 2   Feeling bad or failure about yourself  1 - 1 0 0  Trouble concentrating 0 0 0 0 0  Moving slowly or fidgety/restless 0 0 0 0 0  Suicidal thoughts 1 0 0 0 0  PHQ-9 Score 7 5 6 5 6    GAD 7 : Generalized Anxiety Score 04/07/2016 02/20/2016 01/12/2016 12/29/2015  Nervous, Anxious, on Edge 1 1 1 1   Control/stop worrying 1 1 1 1   Worry too much - different things 1 1 1 1   Trouble relaxing 1 0 0 0  Restless 0 0 0 0  Easily annoyed or irritable 3 1 2 2   Afraid - awful might happen 1 1 1  0  Total GAD 7 Score 8 5 6  5

## 2016-04-07 NOTE — Patient Instructions (Signed)
My Safety Plan:   Step 1: Warning signs (thoughts, images, mood, situation, behavior) that a crisis may be developing: When I feel my mood changing  Step 2: Internal coping strategies: Things I can do to take my mind off my problems without contacting another person (music, relaxation technique, physical activity): Relaxation techniques  Step 3: People and social settings that provide distraction:  Name and contact: Katrina Stewart [phone] Name and contact: Katrina Stewart [phone] Name and contact: Katrina Stewart [phone]  Step 4: People I can ask for help:  Name, relationship, contact: Katrina Stewart, [phone] Name, relationship, contact: Katrina Stewart, [phone] Name, relationship, contact: Katrina Stewart [phone]  Step 5: Professionals or agencies I can contact during a crisis:  Local urgent care services:      1. 9-1-1     2. [facility] [employer] (24/7 walk-in) [address], [place], [place]     3. [place]: Intake- [id number] [phone]     4. [place] : [phone]  Closest Urgent Care/Emergency Room Address:  Suicide Prevention [facility] Phone: [phone]  Step 6: Making the environment safe- Have friend/family member remove from home: Katrina Stewart, Katrina Stewart, will do this: * Weapons in the home: kitchen knives, razor blades * Medication in the home (including Tylenol)(all)  Step 7: The one thing that is most important to me and worth living for is:   Signature of Patient: _________________________________________________  Signature of Provider: ________________________________________________

## 2016-04-07 NOTE — Patient Instructions (Signed)

## 2016-04-07 NOTE — Progress Notes (Signed)
Subjective:     Katrina Stewart is a 21 y.o. female who presents for a postpartum visit. She is 6 weeks postpartum following a spontaneous vaginal delivery. I have fully reviewed the prenatal and intrapartum course. The delivery was at 40 gestational weeks. Outcome: spontaneous vaginal delivery. Anesthesia: epidural. Postpartum course has been uneventful, though patient has been having some depression recurrence. Baby's course has been uneventful. Baby is feeding by both breast and bottle - Similac Advance. Bleeding no bleeding. Bowel function is normal. Bladder function is normal. Patient is not sexually active. Contraception method is IUD. Postpartum depression screening: positive.  Depression scale noted to have mention of thoughts of death   Patient denies that these have been recent but does admit to depression over loss of her uncle (they found him dead at her house) and cousin who had a term stillbirth.   Wants Liletta IUD but concerned it has estrogen, as her mother had complications from estrogen. Reasurred her there is no estrogen  The following portions of the patient's history were reviewed and updated as appropriate: allergies, current medications, past family history, past medical history, past social history, past surgical history and problem list.  Review of Systems Pertinent items are noted in HPI.   Objective:    There were no vitals taken for this visit.  General:  alert, cooperative and no distress   Breasts:  inspection negative, no nipple discharge or bleeding, no masses or nodularity palpable  Lungs: clear to auscultation bilaterally  Heart:  regular rate and rhythm, S1, S2 normal, no murmur, click, rub or gallop  Abdomen: soft, non-tender; bowel sounds normal; no masses,  no organomegaly   Vulva:  normal  Vagina: normal vagina, no discharge, exudate, lesion, or erythema  Cervix:  multiparous appearance  Corpus: normal size, contour, position, consistency, mobility,  non-tender  Adnexa:  no mass, fullness, tenderness  Rectal Exam: Not performed.        Patient identified, informed consent performed, signed copy in chart, time out was performed.  Urine pregnancy test negative.  Speculum placed in the vagina.  Cervix visualized.  Cleaned with Betadine x 2.  Grasped anteriourly with a single tooth tenaculum.  Uterus sounded to 4-5cm.  Mirena IUD placed per manufacturer's recommendations.  Strings trimmed to 3 cm.   Patient given post procedure instructions and Mirena care card with expiration date.  Patient is asked to check IUD strings periodically and follow up in 4-6 weeks for IUD check.  Assessment:     Normal postpartum exam. Pap smear not done at today's visit.   History of depression, recent signs of recurrent depression  Plan:    1. Contraception: IUD 2. Referred to Social worker today for counseling 3. Follow up in: 1 month or as needed.

## 2016-09-03 ENCOUNTER — Inpatient Hospital Stay (HOSPITAL_COMMUNITY)
Admission: AD | Admit: 2016-09-03 | Discharge: 2016-09-03 | Disposition: A | Discharge status: Home / Self Care | Payer: Medicaid Other | Source: Ambulatory Visit | Attending: Obstetrics & Gynecology | Admitting: Obstetrics & Gynecology

## 2016-09-03 ENCOUNTER — Encounter (HOSPITAL_COMMUNITY): Payer: Self-pay | Admitting: *Deleted

## 2016-09-03 DIAGNOSIS — Z30432 Encounter for removal of intrauterine contraceptive device: Secondary | ICD-10-CM | POA: Insufficient documentation

## 2016-09-03 DIAGNOSIS — F329 Major depressive disorder, single episode, unspecified: Secondary | ICD-10-CM | POA: Insufficient documentation

## 2016-09-03 DIAGNOSIS — Z79899 Other long term (current) drug therapy: Secondary | ICD-10-CM | POA: Insufficient documentation

## 2016-09-03 DIAGNOSIS — R109 Unspecified abdominal pain: Secondary | ICD-10-CM | POA: Diagnosis not present

## 2016-09-03 DIAGNOSIS — B9689 Other specified bacterial agents as the cause of diseases classified elsewhere: Secondary | ICD-10-CM | POA: Insufficient documentation

## 2016-09-03 DIAGNOSIS — F419 Anxiety disorder, unspecified: Secondary | ICD-10-CM | POA: Insufficient documentation

## 2016-09-03 DIAGNOSIS — N760 Acute vaginitis: Secondary | ICD-10-CM | POA: Insufficient documentation

## 2016-09-03 LAB — URINALYSIS, ROUTINE W REFLEX MICROSCOPIC
Bilirubin Urine: NEGATIVE
Glucose, UA: NEGATIVE mg/dL
Ketones, ur: NEGATIVE mg/dL
Leukocytes, UA: NEGATIVE
NITRITE: NEGATIVE
PH: 6 (ref 5.0–8.0)
Protein, ur: NEGATIVE mg/dL
SPECIFIC GRAVITY, URINE: 1.023 (ref 1.005–1.030)

## 2016-09-03 LAB — CBC
HEMATOCRIT: 35.5 % — AB (ref 36.0–46.0)
HEMOGLOBIN: 11.2 g/dL — AB (ref 12.0–15.0)
MCH: 21.7 pg — ABNORMAL LOW (ref 26.0–34.0)
MCHC: 31.5 g/dL (ref 30.0–36.0)
MCV: 68.9 fL — AB (ref 78.0–100.0)
PLATELETS: 354 10*3/uL (ref 150–400)
RBC: 5.15 MIL/uL — ABNORMAL HIGH (ref 3.87–5.11)
RDW: 16.6 % — AB (ref 11.5–15.5)
WBC: 7.6 10*3/uL (ref 4.0–10.5)

## 2016-09-03 LAB — WET PREP, GENITAL
Sperm: NONE SEEN
Trich, Wet Prep: NONE SEEN
YEAST WET PREP: NONE SEEN

## 2016-09-03 LAB — POCT PREGNANCY, URINE: PREG TEST UR: NEGATIVE

## 2016-09-03 MED ORDER — NORGESTIMATE-ETH ESTRADIOL 0.25-35 MG-MCG PO TABS: 1.0000 | ORAL_TABLET | Freq: Every day | ORAL | 11 refills | Status: DC

## 2016-09-03 MED ORDER — METRONIDAZOLE 500 MG PO TABS: 500.0000 mg | ORAL_TABLET | Freq: Two times a day (BID) | ORAL | 0 refills | Status: DC

## 2016-09-03 NOTE — MAU Note (Signed)
Pt presents to MAU c/o pain in her lower abdomen into her vaginal area pt describes pain as a sharp shooting pain that is constant. Pt denies bleeding at this time. Pt also c/o dizziness upon standing and HA.

## 2016-09-03 NOTE — MAU Provider Note (Signed)
History     CSN: [id number]  Arrival date and time: 09/03/16 2144   First Provider Initiated Contact with Patient 09/03/16 2258      Chief Complaint  Patient presents with  . IUD PAIN   HPI   Katrina Stewart is a 21 y.o. female [id number] here in MAU with abdominal pain, back pain and vaginal pain. The pain started after she got her IUD put in in March 2018. Says the vaginal pain is bothering her the most. States she has been taking ibuprofen for the pain. The last time she took ibuprofen was at 7 pm tonight. Says that the Ibuprofen did not help much. She took 600 mg of ibuprofen. States she is not having any bleeding now. However has had abnormal bleeding with the IUD in place. Denies fever, or vomiting. Occasional nausea   OB History    Gravida Para Term Preterm AB Living   3 1 1   1 1    SAB TAB Ectopic Multiple Live Births   1 0   0 1      Past Medical History:  Diagnosis Date  . Anxiety   . Depression     Past Surgical History:  Procedure Laterality Date  . BREAST SURGERY    . MASS EXCISION  10/29/2011   Procedure: EXCISION MASS;  Surgeon: Harl Bowie, MD;  Location: [facility];  Service: General;  Laterality: Bilateral;  excision of bilateral breast masses    Family History  Problem Relation Age of Onset  . Cancer Maternal Grandmother        lung cancer  . Cancer Other        bladder cancer    Social History  Substance Use Topics  . Smoking status: Never Smoker  . Smokeless tobacco: Never Used  . Alcohol use 1.2 - 1.8 oz/week    2 - 3 Shots of liquor per week     Comment: none since pregnancy    Allergies: No Known Allergies  Prescriptions Prior to Admission  Medication Sig Dispense Refill Last Dose  . acetaminophen (TYLENOL) 500 MG tablet Take 1,000 mg by mouth every 6 (six) hours as needed for moderate pain.   09/03/2016 at Unknown time  . FLUoxetine (PROZAC) 40 MG capsule Take 1 capsule (40 mg total) by mouth daily. 30 capsule 3 Past Week at  Unknown time  . ibuprofen (ADVIL,MOTRIN) 600 MG tablet Take 1 tablet (600 mg total) by mouth every 6 (six) hours. 30 tablet 0 09/03/2016 at Unknown time  . ferrous sulfate (FERROUSUL) 325 (65 FE) MG tablet Take 1 tablet (325 mg total) by mouth daily with breakfast. (Patient not taking: Reported on 04/07/2016) 30 tablet 3 Not Taking  . hydrocortisone-pramoxine (PROCTOFOAM HC) rectal foam Place 1 applicator rectally 2 (two) times daily. (Patient not taking: Reported on 04/07/2016) 10 g 2 Not Taking  . Prenat-FeFum-FePo-FA-Omega 3 (CONCEPT DHA) 53.5-38-1 MG CAPS Take 1 tablet by mouth daily. (Patient not taking: Reported on 04/07/2016) 30 capsule 2 Not Taking  . senna-docusate (SENOKOT-S) 8.6-50 MG tablet Take 2 tablets by mouth daily. (Patient not taking: Reported on 04/07/2016) 30 tablet 0 Not Taking   Results for orders placed or performed during the hospital encounter of 09/03/16 (from the past 48 hour(s))  Urinalysis, Routine w reflex microscopic     Status: Abnormal   Collection Time: 09/03/16 10:13 PM  Result Value Ref Range   Color, Urine YELLOW YELLOW   APPearance CLEAR CLEAR   Specific Gravity, Urine 1.023  1.005 - 1.030   pH 6.0 5.0 - 8.0   Glucose, UA NEGATIVE NEGATIVE mg/dL   Hgb urine dipstick SMALL (A) NEGATIVE   Bilirubin Urine NEGATIVE NEGATIVE   Ketones, ur NEGATIVE NEGATIVE mg/dL   Protein, ur NEGATIVE NEGATIVE mg/dL   Nitrite NEGATIVE NEGATIVE   Leukocytes, UA NEGATIVE NEGATIVE   RBC / HPF 0-5 0 - 5 RBC/hpf   WBC, UA 0-5 0 - 5 WBC/hpf   Bacteria, UA RARE (A) NONE SEEN   Squamous Epithelial / LPF 0-5 (A) NONE SEEN   Mucous PRESENT   Pregnancy, urine POC     Status: None   Collection Time: 09/03/16 10:26 PM  Result Value Ref Range   Preg Test, Ur NEGATIVE NEGATIVE    Comment:        THE SENSITIVITY OF THIS METHODOLOGY IS >24 mIU/mL   CBC     Status: Abnormal   Collection Time: 09/03/16 11:06 PM  Result Value Ref Range   WBC 7.6 4.0 - 10.5 K/uL   RBC 5.15 (H) 3.87 - 5.11  MIL/uL   Hemoglobin 11.2 (L) 12.0 - 15.0 g/dL   HCT 35.5 (L) 36.0 - 46.0 %   MCV 68.9 (L) 78.0 - 100.0 fL   MCH 21.7 (L) 26.0 - 34.0 pg   MCHC 31.5 30.0 - 36.0 g/dL   RDW 16.6 (H) 11.5 - 15.5 %   Platelets 354 150 - 400 K/uL  Wet prep, genital     Status: Abnormal   Collection Time: 09/03/16 11:14 PM  Result Value Ref Range   Yeast Wet Prep HPF POC NONE SEEN NONE SEEN   Trich, Wet Prep NONE SEEN NONE SEEN   Clue Cells Wet Prep HPF POC PRESENT (A) NONE SEEN   WBC, Wet Prep HPF POC MANY (A) NONE SEEN    Comment: MANY BACTERIA SEEN   Sperm NONE SEEN     Review of Systems  Constitutional: Negative for fever.  Gastrointestinal: Positive for nausea. Negative for vomiting.   Physical Exam   Blood pressure 133/84, pulse (!) 109, temperature 98.3 F (36.8 C), temperature source Oral, resp. rate 17, height 5\' 8"  (1.727 m), weight 230 lb (104.3 kg), unknown if currently breastfeeding.  Physical Exam  Constitutional: She is oriented to person, place, and time. She appears well-developed and well-nourished. No distress.  HENT:  Head: Normocephalic.  Eyes: Pupils are equal, round, and reactive to light.  GI: Soft. She exhibits no distension. There is no tenderness. There is no rebound.  Genitourinary:  Genitourinary Comments: Vagina - Small amount of white vaginal discharge, no odor  Cervix - No contact bleeding, no active bleeding, IUD arms noted in the cervix. IUD gently removed with ring forceps. IUD intact post removal.   Bimanual exam: Cervix slightly open, no CMT  Uterus non tender, normal size Adnexa non tender, no masses bilaterally GC/Chlam, wet prep done Chaperone present for exam.   Musculoskeletal: Normal range of motion.  Neurological: She is alert and oriented to person, place, and time.  Skin: Skin is warm. She is not diaphoretic.  Psychiatric: Her behavior is normal.    MAU Course  Procedures  None  MDM  CBC Wet prep & GC   Assessment and Plan    A:  Encounter for IUD removal  Abdominal pain in female  BV (bacterial vaginosis)   P:  Discharge home in stable condition Rx: Sprintec, Flagyl Call the [facility] to schedule reinsertion of IUD if desired Return to MAU if symptoms  worsen Ok to take ibuprofen as needed, as directed on the bottle.    Noni Saupe I, NP 09/04/2016 12:17 AM

## 2016-09-03 NOTE — Discharge Instructions (Signed)
Hormonal Contraception Information °Hormonal contraception is a type of birth control that uses hormones to prevent pregnancy. It usually involves a combination of the hormones estrogen and progesterone or only the hormone progesterone. Hormonal contraception works in these ways: °· It thickens the mucus in the cervix, making it harder for sperm to enter the uterus. °· It changes the lining of the uterus, making it harder for an egg to implant. °· It may stop the ovaries from releasing eggs (ovulation). Some women who take hormonal contraceptives that contain only progesterone may continue to ovulate. ° °Hormonal contraception cannot prevent sexually transmitted infections (STIs). Pregnancy may still occur. °Estrogen and progesterone contraceptives °Contraceptives that use a combination of estrogen and progesterone are available in these forms: °· Pill. Pills come in different combinations of hormones. They must be taken at the same time each day. Pills can affect your period, causing you to get your period once every three months or not at all. °· Patch. The patch must be worn on the lower abdomen for three weeks and then removed on the fourth. °· Vaginal ring. The ring is placed in the vagina and left there for three weeks. It is then removed for one week. ° °Progesterone contraceptives °Contraceptives that use progesterone only are available in these forms: °· Pill. Pills should be taken every day of the cycle. °· Intrauterine device (IUD). This device is inserted into the uterus and removed or replaced every five years or sooner. °· Implant. Plastic rods are placed under the skin of the upper arm. They are removed or replaced every three years or sooner. °· Injection. The injection is given once every 90 days. ° °What are the side effects? °The side effects of estrogen and progesterone contraceptives include: °· Nausea. °· Headaches. °· Breast tenderness. °· Bleeding or spotting between menstrual cycles. °· High  blood pressure (rare). °· Strokes, heart attacks, or blood clots (rare) ° °Side effects of progesterone-only contraceptives include: °· Nausea. °· Headaches. °· Breast tenderness. °· Unpredictable menstrual bleeding. °· High blood pressure (rare). ° °Talk to your health care provider about what side effects may affect you. °Where to find more information: °· Ask your health care provider for more information and resources about hormonal contraception. °· U.[place] Department of Health and Human Services Office on Women's Health: www.womenshealth.gov °Questions to ask: °· What type of hormonal contraception is right for me? °· How long should I plan to use hormonal contraception? °· What are the side effects of the hormonal contraception method I choose? °· How can I prevent STIs while using hormonal contraception? °Contact a health care provider if: °· You start taking hormonal contraceptives and you develop persistent or severe side effects. °Summary °· Estrogen and progesterone are hormones used in many forms of birth control. °· Talk to your health care provider about what side effects may affect you. °· Hormonal contraception cannot prevent sexually transmitted infections (STIs). °· Ask your health care provider for more information and resources about hormonal contraception. °This information is not intended to replace advice given to you by your health care provider. Make sure you discuss any questions you have with your health care provider. °Document Released: 02/07/2007 Document Revised: 12/19/2015 Document Reviewed: 12/19/2015 °Elsevier Interactive Patient Education © 2018 Else[employer] Inc[employer] ° °

## 2016-09-06 LAB — GC/CHLAMYDIA PROBE AMP (~~LOC~~) NOT AT ARMC
Chlamydia: NEGATIVE
NEISSERIA GONORRHEA: NEGATIVE

## 2016-10-13 ENCOUNTER — Inpatient Hospital Stay (HOSPITAL_COMMUNITY)
Admission: AD | Admit: 2016-10-13 | Discharge: 2016-10-14 | Disposition: A | Discharge status: Home / Self Care | Payer: Medicaid Other | Source: Ambulatory Visit | Attending: Obstetrics and Gynecology | Admitting: Obstetrics and Gynecology

## 2016-10-13 ENCOUNTER — Encounter (HOSPITAL_COMMUNITY): Payer: Self-pay

## 2016-10-13 DIAGNOSIS — F419 Anxiety disorder, unspecified: Secondary | ICD-10-CM | POA: Insufficient documentation

## 2016-10-13 DIAGNOSIS — N3001 Acute cystitis with hematuria: Secondary | ICD-10-CM | POA: Diagnosis not present

## 2016-10-13 DIAGNOSIS — N730 Acute parametritis and pelvic cellulitis: Secondary | ICD-10-CM | POA: Diagnosis not present

## 2016-10-13 DIAGNOSIS — M549 Dorsalgia, unspecified: Secondary | ICD-10-CM | POA: Diagnosis present

## 2016-10-13 DIAGNOSIS — N898 Other specified noninflammatory disorders of vagina: Secondary | ICD-10-CM | POA: Diagnosis present

## 2016-10-13 DIAGNOSIS — F329 Major depressive disorder, single episode, unspecified: Secondary | ICD-10-CM | POA: Diagnosis not present

## 2016-10-13 LAB — URINALYSIS, ROUTINE W REFLEX MICROSCOPIC
Bilirubin Urine: NEGATIVE
Glucose, UA: NEGATIVE mg/dL
Ketones, ur: NEGATIVE mg/dL
Leukocytes, UA: NEGATIVE
NITRITE: NEGATIVE
PROTEIN: 30 mg/dL — AB
SPECIFIC GRAVITY, URINE: 1.029 (ref 1.005–1.030)
pH: 5 (ref 5.0–8.0)

## 2016-10-13 LAB — CBC
HEMATOCRIT: 34.8 % — AB (ref 36.0–46.0)
Hemoglobin: 11.3 g/dL — ABNORMAL LOW (ref 12.0–15.0)
MCH: 22.3 pg — ABNORMAL LOW (ref 26.0–34.0)
MCHC: 32.5 g/dL (ref 30.0–36.0)
MCV: 68.6 fL — ABNORMAL LOW (ref 78.0–100.0)
Platelets: 310 10*3/uL (ref 150–400)
RBC: 5.07 MIL/uL (ref 3.87–5.11)
RDW: 16.9 % — ABNORMAL HIGH (ref 11.5–15.5)
WBC: 6.4 10*3/uL (ref 4.0–10.5)

## 2016-10-13 LAB — WET PREP, GENITAL
SPERM: NONE SEEN
TRICH WET PREP: NONE SEEN
YEAST WET PREP: NONE SEEN

## 2016-10-13 LAB — POCT PREGNANCY, URINE: PREG TEST UR: NEGATIVE

## 2016-10-13 MED ORDER — AZITHROMYCIN 250 MG PO TABS
1000.0000 mg | ORAL_TABLET | Freq: Once | ORAL | Status: AC
  Administered 2016-10-13: 1000 mg via ORAL
  Filled 2016-10-13: qty 4

## 2016-10-13 MED ORDER — KETOROLAC TROMETHAMINE 30 MG/ML IJ SOLN
60.0000 mg | Freq: Once | INTRAMUSCULAR | Status: AC
  Administered 2016-10-13: 60 mg via INTRAMUSCULAR
  Filled 2016-10-13 (×2): qty 2

## 2016-10-13 MED ORDER — CEFTRIAXONE SODIUM 250 MG IJ SOLR
250.0000 mg | Freq: Once | INTRAMUSCULAR | Status: AC
  Administered 2016-10-13: 250 mg via INTRAMUSCULAR
  Filled 2016-10-13: qty 250

## 2016-10-13 MED ORDER — AZITHROMYCIN 250 MG PO TABS: 1000.0000 mg | ORAL_TABLET | Freq: Every day | ORAL | Status: DC

## 2016-10-13 NOTE — MAU Provider Note (Signed)
History     CSN: [id number]  Arrival date and time: 10/13/16 2212   First Provider Initiated Contact with Patient 10/13/16 2303      Chief Complaint  Patient presents with  . Back Pain  . Vaginal Discharge   Non-pregnant female here with LAP. Pain is ongoing since early August. Describes as bilateral, constant and cramping. Rates 8/10. Took Tylenol and had no relief. Denies fever. Denies urinary sx. Reports 2-3 day hx of N/V. No diarrhea. No sick contacts. Tolerating some po. Last IC was in July. Had IUD removed last month. Menses started today.   Past Medical History:  Diagnosis Date  . Anxiety   . Depression     Past Surgical History:  Procedure Laterality Date  . BREAST SURGERY    . MASS EXCISION  10/29/2011   Procedure: EXCISION MASS;  Surgeon: Harl Bowie, MD;  Location: [facility];  Service: General;  Laterality: Bilateral;  excision of bilateral breast masses    Family History  Problem Relation Age of Onset  . Cancer Maternal Grandmother        lung cancer  . Cancer Other        bladder cancer    Social History  Substance Use Topics  . Smoking status: Never Smoker  . Smokeless tobacco: Never Used  . Alcohol use 1.2 - 1.8 oz/week    2 - 3 Shots of liquor per week     Comment: none since pregnancy    Allergies: No Known Allergies  Prescriptions Prior to Admission  Medication Sig Dispense Refill Last Dose  . acetaminophen (TYLENOL) 500 MG tablet Take 1,000 mg by mouth every 6 (six) hours as needed for moderate pain.   10/13/2016 at Unknown time  . FLUoxetine (PROZAC) 40 MG capsule Take 1 capsule (40 mg total) by mouth daily. 30 capsule 3 10/12/2016 at Unknown time  . metroNIDAZOLE (FLAGYL) 500 MG tablet Take 1 tablet (500 mg total) by mouth 2 (two) times daily. 14 tablet 0 Past Month at Unknown time  . ferrous sulfate (FERROUSUL) 325 (65 FE) MG tablet Take 1 tablet (325 mg total) by mouth daily with breakfast. (Patient not taking: Reported on 04/07/2016) 30  tablet 3 More than a month at Unknown time  . ibuprofen (ADVIL,MOTRIN) 600 MG tablet Take 1 tablet (600 mg total) by mouth every 6 (six) hours. 30 tablet 0 More than a month at Unknown time  . norgestimate-ethinyl estradiol (ORTHO-CYCLEN,SPRINTEC,PREVIFEM) 0.25-35 MG-MCG tablet Take 1 tablet by mouth daily. 1 Package 11 More than a month at Unknown time  . Prenat-FeFum-FePo-FA-Omega 3 (CONCEPT DHA) 53.5-38-1 MG CAPS Take 1 tablet by mouth daily. (Patient not taking: Reported on 04/07/2016) 30 capsule 2 More than a month at Unknown time    Review of Systems  Constitutional: Negative for fever.  Gastrointestinal: Positive for abdominal pain, nausea and vomiting. Negative for constipation and diarrhea.  Genitourinary: Positive for vaginal bleeding. Negative for dysuria, frequency, hematuria, urgency and vaginal discharge.  Musculoskeletal: Negative for back pain.   Physical Exam   Blood pressure (!) 148/93, pulse 100, temperature 98.5 F (36.9 C), temperature source Oral, resp. rate 17, SpO2 100 %, unknown if currently breastfeeding.  Physical Exam  Constitutional: She is oriented to person, place, and time. She appears well-developed and well-nourished. No distress.  HENT:  Head: Normocephalic and atraumatic.  Neck: Normal range of motion.  Respiratory: Effort normal.  GI: Soft. She exhibits no distension and no mass. There is no tenderness. There is  no rebound and no guarding.  Genitourinary:  Genitourinary Comments: External: no lesions or erythema Vagina: rugated, pink, moist, mod bloody discharge Uterus: non enlarged, anteverted, + tender, ++ CMT Adnexae: no masses, + tenderness left, no tenderness right   Musculoskeletal: Normal range of motion.  Neurological: She is alert and oriented to person, place, and time.  Skin: Skin is warm and dry.  Psychiatric: She has a normal mood and affect.   Results for orders placed or performed during the hospital encounter of 10/13/16 (from the  past 24 hour(s))  Urinalysis, Routine w reflex microscopic     Status: Abnormal   Collection Time: 10/13/16 10:29 PM  Result Value Ref Range   Color, Urine YELLOW YELLOW   APPearance HAZY (A) CLEAR   Specific Gravity, Urine 1.029 1.005 - 1.030   pH 5.0 5.0 - 8.0   Glucose, UA NEGATIVE NEGATIVE mg/dL   Hgb urine dipstick LARGE (A) NEGATIVE   Bilirubin Urine NEGATIVE NEGATIVE   Ketones, ur NEGATIVE NEGATIVE mg/dL   Protein, ur 30 (A) NEGATIVE mg/dL   Nitrite NEGATIVE NEGATIVE   Leukocytes, UA NEGATIVE NEGATIVE   RBC / HPF TOO NUMEROUS TO COUNT 0 - 5 RBC/hpf   WBC, UA TOO NUMEROUS TO COUNT 0 - 5 WBC/hpf   Bacteria, UA RARE (A) NONE SEEN   Squamous Epithelial / LPF 0-5 (A) NONE SEEN   Mucus PRESENT   Pregnancy, urine POC     Status: None   Collection Time: 10/13/16 10:41 PM  Result Value Ref Range   Preg Test, Ur NEGATIVE NEGATIVE  Wet prep, genital     Status: Abnormal   Collection Time: 10/13/16 11:08 PM  Result Value Ref Range   Yeast Wet Prep HPF POC NONE SEEN NONE SEEN   Trich, Wet Prep NONE SEEN NONE SEEN   Clue Cells Wet Prep HPF POC PRESENT (A) NONE SEEN   WBC, Wet Prep HPF POC FEW (A) NONE SEEN   Sperm NONE SEEN   CBC     Status: Abnormal   Collection Time: 10/13/16 11:17 PM  Result Value Ref Range   WBC 6.4 4.0 - 10.5 K/uL   RBC 5.07 3.87 - 5.11 MIL/uL   Hemoglobin 11.3 (L) 12.0 - 15.0 g/dL   HCT 34.8 (L) 36.0 - 46.0 %   MCV 68.6 (L) 78.0 - 100.0 fL   MCH 22.3 (L) 26.0 - 34.0 pg   MCHC 32.5 30.0 - 36.0 g/dL   RDW 16.9 (H) 11.5 - 15.5 %   Platelets 310 150 - 400 K/uL   MAU Course  Procedures Rocephin Azithromycin Toradol  MDM Labs ordered and reviewed. Will treat PID and UTI. UC sent. Stable for discharge home.  Assessment and Plan   1. PID (acute pelvic inflammatory disease)   2. Acute cystitis with hematuria    Discharge home Follow up with PCP as needed Rx Azithro Rx Flagyl Rx Bactrim Ibuprofen OTC prn  Allergies as of 10/14/2016   No Known  Allergies     Medication List    STOP taking these medications   CONCEPT DHA 53.5-38-1 MG Caps     TAKE these medications   acetaminophen 500 MG tablet Commonly known as:  TYLENOL Take 1,000 mg by mouth every 6 (six) hours as needed for moderate pain.   azithromycin 250 MG tablet Commonly known as:  ZITHROMAX Take 4 tablets (1,000 mg total) by mouth once. Take in 1 week   ferrous sulfate 325 (65 FE) MG tablet Commonly  known as:  FERROUSUL Take 1 tablet (325 mg total) by mouth daily with breakfast.   FLUoxetine 40 MG capsule Commonly known as:  PROZAC Take 1 capsule (40 mg total) by mouth daily.   ibuprofen 600 MG tablet Commonly known as:  ADVIL,MOTRIN Take 1 tablet (600 mg total) by mouth every 6 (six) hours.   metroNIDAZOLE 500 MG tablet Commonly known as:  FLAGYL Take 1 tablet (500 mg total) by mouth 2 (two) times daily.   norgestimate-ethinyl estradiol 0.25-35 MG-MCG tablet Commonly known as:  ORTHO-CYCLEN,SPRINTEC,PREVIFEM Take 1 tablet by mouth daily.   sulfamethoxazole-trimethoprim 800-160 MG tablet Commonly known as:  BACTRIM DS,SEPTRA DS Take 1 tablet by mouth 2 (two) times daily.            Discharge Care Instructions        Start     Ordered   10/14/16 0000  Discharge patient    Question Answer Comment  Discharge disposition 01-Home or Self Care   Discharge patient date 10/14/2016      10/14/16 0007   10/14/16 0000  metroNIDAZOLE (FLAGYL) 500 MG tablet  2 times daily    Question:  Supervising Provider  Answer:  Jonnie Kind   10/14/16 0007   10/14/16 0000  azithromycin (ZITHROMAX) 250 MG tablet   Once    Question:  Supervising Provider  Answer:  Jonnie Kind   10/14/16 0007   10/14/16 0000  sulfamethoxazole-trimethoprim (BACTRIM DS,SEPTRA DS) 800-160 MG tablet  2 times daily    Question:  Supervising Provider  Answer:  Jonnie Kind   10/14/16 0007     Julianne Handler, CNM 10/13/2016, 11:13 PM

## 2016-10-13 NOTE — MAU Note (Signed)
Pt states she has been having vaginal pain. Similar to 8/3 when we had to remove her IUD because it was out of place.  She says the pain is still there.  She has lower abdominal and back pain as well.  States her discharge is clear/white with an odor.  Her lower back hurts as well. Tried tylenol for the pain but she says it didn't work.  Last took it around 2000 (1000mg ).  She states her left leg feels like sometimes it goes numb like she feels like she may fall.

## 2016-10-14 DIAGNOSIS — N3001 Acute cystitis with hematuria: Secondary | ICD-10-CM | POA: Diagnosis not present

## 2016-10-14 DIAGNOSIS — N730 Acute parametritis and pelvic cellulitis: Secondary | ICD-10-CM

## 2016-10-14 LAB — GC/CHLAMYDIA PROBE AMP (~~LOC~~) NOT AT ARMC
Chlamydia: NEGATIVE
Neisseria Gonorrhea: NEGATIVE

## 2016-10-14 MED ORDER — SULFAMETHOXAZOLE-TRIMETHOPRIM 800-160 MG PO TABS: 1.0000 | ORAL_TABLET | Freq: Two times a day (BID) | ORAL | 0 refills | Status: DC

## 2016-10-14 MED ORDER — METRONIDAZOLE 500 MG PO TABS: 500.0000 mg | ORAL_TABLET | Freq: Two times a day (BID) | ORAL | 0 refills | Status: DC

## 2016-10-14 MED ORDER — AZITHROMYCIN 250 MG PO TABS: 1000.0000 mg | ORAL_TABLET | Freq: Once | ORAL | 0 refills | Status: AC

## 2016-10-14 NOTE — Discharge Instructions (Signed)
Urinary Tract Infection, Adult A urinary tract infection (UTI) is an infection of any part of the urinary tract. The urinary tract includes the:  Kidneys.  Ureters.  Bladder.  Urethra.  These organs make, store, and get rid of pee (urine) in the body. Follow these instructions at home:  Take over-the-counter and prescription medicines only as told by your doctor.  If you were prescribed an antibiotic medicine, take it as told by your doctor. Do not stop taking the antibiotic even if you start to feel better.  Avoid the following drinks: ? Alcohol. ? Caffeine. ? Tea. ? Carbonated drinks.  Drink enough fluid to keep your pee clear or pale yellow.  Keep all follow-up visits as told by your doctor. This is important.  Make sure to: ? Empty your bladder often and completely. Do not to hold pee for long periods of time. ? Empty your bladder before and after sex. ? Wipe from front to back after a bowel movement if you are female. Use each tissue one time when you wipe. Contact a doctor if:  You have back pain.  You have a fever.  You feel sick to your stomach (nauseous).  You throw up (vomit).  Your symptoms do not get better after 3 days.  Your symptoms go away and then come back. Get help right away if:  You have very bad back pain.  You have very bad lower belly (abdominal) pain.  You are throwing up and cannot keep down any medicines or water. This information is not intended to replace advice given to you by your health care provider. Make sure you discuss any questions you have with your health care provider. Document Released: 07/07/2007 Document Revised: 06/26/2015 Document Reviewed: 12/09/2014 Elsevier Interactive Patient Education  2018 [place]. Pelvic Inflammatory Disease Pelvic inflammatory disease (PID) is an infection in some or all of the female organs. PID can be in the uterus, ovaries, fallopian tubes, or the surrounding tissues that are inside  the lower belly area (pelvis). PID can lead to lasting problems if it is not treated. To check for this disease, your doctor may:  Do a physical exam.  Do blood tests, urine tests, or a pregnancy test.  Look at your vaginal discharge.  Do tests to look inside the pelvis.  Test you for other infections.  Follow these instructions at home:  Take over-the-counter and prescription medicines only as told by your doctor.  If you were prescribed an antibiotic medicine, take it as told by your doctor. Do not stop taking it even if you start to feel better.  Do not have sex until treatment is done or as told by your doctor.  Tell your sex partner if you have PID. Your partner may need to be treated.  Keep all follow-up visits as told by your doctor. This is important.  Your doctor may test you for infection again 3 months after you are treated. Contact a doctor if:  You have more fluid (discharge) coming from your vagina or fluid that is not normal.  Your pain does not improve.  You throw up (vomit).  You have a fever.  You cannot take your medicines.  Your partner has a sexually transmitted disease (STD).  You have pain when you pee (urinate). Get help right away if:  You have more belly (abdominal) or lower belly pain.  You have chills.  You are not better after 72 hours. This information is not intended to replace advice given  to you by your health care provider. Make sure you discuss any questions you have with your health care provider. Document Released: 04/16/2008 Document Revised: 06/26/2015 Document Reviewed: 02/25/2014 Elsevier Interactive Patient Education  [employer].

## 2016-10-15 LAB — URINE CULTURE

## 2016-11-21 ENCOUNTER — Encounter (HOSPITAL_COMMUNITY): Payer: Self-pay

## 2016-11-21 DIAGNOSIS — Z79899 Other long term (current) drug therapy: Secondary | ICD-10-CM | POA: Insufficient documentation

## 2016-11-21 DIAGNOSIS — N898 Other specified noninflammatory disorders of vagina: Secondary | ICD-10-CM | POA: Diagnosis not present

## 2016-11-21 DIAGNOSIS — K644 Residual hemorrhoidal skin tags: Secondary | ICD-10-CM | POA: Diagnosis not present

## 2016-11-21 DIAGNOSIS — K649 Unspecified hemorrhoids: Secondary | ICD-10-CM | POA: Diagnosis present

## 2016-11-21 DIAGNOSIS — R1010 Upper abdominal pain, unspecified: Secondary | ICD-10-CM | POA: Diagnosis not present

## 2016-11-21 NOTE — ED Triage Notes (Signed)
States had recurrent problem with hemorrhoids and for last 2 days worsening sx and bright red bleeding.

## 2016-11-21 NOTE — ED Notes (Signed)
Pt c/o having hemorrhoids, and the cream she was given isn't working. Also, she thinks she may have a bacterial infection or a UTI because she has had the same symptoms as the last time.

## 2016-11-22 ENCOUNTER — Emergency Department (HOSPITAL_COMMUNITY)
Admission: EM | Admit: 2016-11-22 | Discharge: 2016-11-22 | Disposition: A | Discharge status: Home / Self Care | Payer: Medicaid Other | Attending: Emergency Medicine | Admitting: Emergency Medicine

## 2016-11-22 DIAGNOSIS — K644 Residual hemorrhoidal skin tags: Secondary | ICD-10-CM

## 2016-11-22 LAB — WET PREP, GENITAL
Clue Cells Wet Prep HPF POC: NONE SEEN
Sperm: NONE SEEN
Trich, Wet Prep: NONE SEEN
YEAST WET PREP: NONE SEEN

## 2016-11-22 LAB — URINALYSIS, ROUTINE W REFLEX MICROSCOPIC
BACTERIA UA: NONE SEEN
BILIRUBIN URINE: NEGATIVE
Glucose, UA: NEGATIVE mg/dL
Ketones, ur: NEGATIVE mg/dL
LEUKOCYTES UA: NEGATIVE
NITRITE: NEGATIVE
PROTEIN: NEGATIVE mg/dL
Specific Gravity, Urine: 1.029 (ref 1.005–1.030)
pH: 6 (ref 5.0–8.0)

## 2016-11-22 LAB — GC/CHLAMYDIA PROBE AMP (~~LOC~~) NOT AT ARMC
Chlamydia: NEGATIVE
NEISSERIA GONORRHEA: NEGATIVE

## 2016-11-22 LAB — POC URINE PREG, ED: PREG TEST UR: NEGATIVE

## 2016-11-22 MED ORDER — HYDROCORTISONE 2.5 % RE CREA: TOPICAL_CREAM | RECTAL | 0 refills | Status: DC

## 2016-11-22 NOTE — ED Provider Notes (Signed)
TIME SEEN: 2:08 AM  CHIEF COMPLAINT: Multiple complaints  HPI: Patient is a 21 year old female with history of depression, anxiety, obesity who presents emergency department with multiple complaints. Patient reports that she has had external hemorrhoids for 2 years. She states at times they will be painful. No bloody stools or melena. Also reports several days of vaginal discharge. She thinks she has a "bacterial infection". She is sexually active and does use protection. No history of STDs. Also complaining of upper abdominal pain. No fevers, vomiting or diarrhea. No dysuria or hematuria. Is concerned she could have a urinary tract infection.  ROS: See HPI Constitutional: no fever  Eyes: no drainage  ENT: no runny nose   Cardiovascular:  no chest pain  Resp: no SOB  GI: no vomiting GU: no dysuria Integumentary: no rash  Allergy: no hives  Musculoskeletal: no leg swelling  Neurological: no slurred speech ROS otherwise negative  PAST MEDICAL HISTORY/PAST SURGICAL HISTORY:  Past Medical History:  Diagnosis Date  . Anxiety   . Depression     MEDICATIONS:  Prior to Admission medications   Medication Sig Start Date End Date Taking? Authorizing Provider  acetaminophen (TYLENOL) 500 MG tablet Take 1,000 mg by mouth every 6 (six) hours as needed for moderate pain.    [provider]  ferrous sulfate (FERROUSUL) 325 (65 FE) MG tablet Take 1 tablet (325 mg total) by mouth daily with breakfast. Patient not taking: Reported on 04/07/2016 12/10/15   Chancy Milroy, MD  FLUoxetine (PROZAC) 40 MG capsule Take 1 capsule (40 mg total) by mouth daily. 11/27/15   Chancy Milroy, MD  ibuprofen (ADVIL,MOTRIN) 600 MG tablet Take 1 tablet (600 mg total) by mouth every 6 (six) hours. 02/24/16   Loann Quill, MD  metroNIDAZOLE (FLAGYL) 500 MG tablet Take 1 tablet (500 mg total) by mouth 2 (two) times daily. 10/14/16   Julianne Handler, CNM  norgestimate-ethinyl estradiol  (ORTHO-CYCLEN,SPRINTEC,PREVIFEM) 0.25-35 MG-MCG tablet Take 1 tablet by mouth daily. 09/03/16   Rasch, Anderson Malta I, NP  sulfamethoxazole-trimethoprim (BACTRIM DS,SEPTRA DS) 800-160 MG tablet Take 1 tablet by mouth 2 (two) times daily. 10/14/16   Julianne Handler, CNM    ALLERGIES:  No Known Allergies  SOCIAL HISTORY:  Social History  Substance Use Topics  . Smoking status: Never Smoker  . Smokeless tobacco: Never Used  . Alcohol use 1.2 - 1.8 oz/week    2 - 3 Shots of liquor per week     Comment: none since pregnancy    FAMILY HISTORY: Family History  Problem Relation Age of Onset  . Cancer Maternal Grandmother        lung cancer  . Cancer Other        bladder cancer    EXAM: BP (!) 158/98 (BP Location: Left Arm)   Pulse 86   Temp 98.1 F (36.7 C) (Oral)   Resp 20   Ht 5\' 8"  (1.727 m)   Wt 104.3 kg (230 lb)   SpO2 100%   BMI 34.97 kg/m  CONSTITUTIONAL: Alert and oriented and responds appropriately to questions. Well-appearing; well-nourished, obese, afebrile, nontoxic, well-hydrated HEAD: Normocephalic EYES: Conjunctivae clear, pupils appear equal, EOMI ENT: normal nose; moist mucous membranes NECK: Supple, no meningismus, no nuchal rigidity, no LAD  CARD: RRR; S1 and S2 appreciated; no murmurs, no clicks, no rubs, no gallops RESP: Normal chest excursion without splinting or tachypnea; breath sounds clear and equal bilaterally; no wheezes, no rhonchi, no rales, no hypoxia or respiratory distress, speaking  full sentences ABD/GI: Normal bowel sounds; non-distended; soft, non-tender, no rebound, no guarding, no peritoneal signs, no hepatosplenomegaly, negative Murphy sign, no tenderness at McBurney's point GU:  Normal external genitalia. No lesions, rashes noted. Patient has no vaginal bleeding on exam. No significant vaginal discharge.  No adnexal tenderness, mass or fullness, no cervical motion tenderness. Cervix is not appear friable.  Cervix is closed.  Chaperone present for  exam. RECTAL:  Normal rectal tone, no gross blood or melena, several nonthrombosed nonbleeding external hemorrhoids appreciated, nontender rectal exam, no fecal impaction BACK:  The back appears normal and is non-tender to palpation, there is no CVA tenderness EXT: Normal ROM in all joints; non-tender to palpation; no edema; normal capillary refill; no cyanosis, no calf tenderness or swelling    SKIN: Normal color for age and race; warm; no rash NEURO: Moves all extremities equally PSYCH: The patient's mood and manner are appropriate. Grooming and personal hygiene are appropriate.  MEDICAL DECISION MAKING: Patient here with multiple complaints.  Patient complains of upper abdominal pain. No fevers, vomiting or diarrhea. No tenderness on exam. When I asked her if this is what prompted her to come to the emergency department she states no declines further workup for this. Patient also plenty of hemorrhoids for 2 years. They're not bleeding and not thrombosed. No sign of infection. We'll discharge with Anusol and give outpatient follow-up. Recommend stool softeners, water and increase fiber intake. Patient also complaining of vaginal discharge. Pelvic exam performed which is unremarkable. Urine shows no sign of infection, pregnancy test negative. Wet prep also unremarkable. I have sent GC and chlamydia cultures. No signs to suggest PID, TOA, torsion based on her exam. Doubt appendicitis or cholecystitis or pancreatitis. I do not feel she needs further emergent workup. I have given her a list of outpatient primary care doctors as well as outpatient OB/GYN physicians. Discussed importance of outpatient follow-up for her multiple chronic symptoms. She is here with her brother who is also checked in as a patient for other complaints.  At this time, I do not feel there is any life-threatening condition present. I have reviewed and discussed all results (EKG, imaging, lab, urine as appropriate) and exam findings  with patient/family. I have reviewed nursing notes and appropriate previous records.  I feel the patient is safe to be discharged home without further emergent workup and can continue workup as an outpatient as needed. Discussed usual and customary return precautions. Patient/family verbalize understanding and are comfortable with this plan.  Outpatient follow-up has been provided if needed. All questions have been answered.      Ward, Delice Bison, DO 11/22/16 [phone]

## 2016-11-22 NOTE — Discharge Instructions (Signed)
For your hemorrhoids, I recommend that you use a stool softener such as Colace 100 mg twice a day to keep your bowel movements soft.  I also recommend that you do not use toilet paper and instead use witch hazel wipes or baby wipes. A high-fiber diet will also help to keep your stools soft as well increasing your water intake. If you cannot eat foods high in fiber, you may use Benefiber or Metamucil over-the-counter once daily. I also recommend over the counter preparation H for your hemorrhoid.   Your urine showed no sign of infection. Pregnancy test is negative. Your pelvic cultures were normal and did not show bacterial vaginosis or yeast. We have checked you for sexual transmitted diseases. If these are positive you will be contacted. If you continue to have abnormal vaginal discharge, you can follow-up with outpatient primary care physician or OB/GYN.   To find a primary care or specialty doctor please call [phone] or [phone] to access "[facility]."  You may also go on the [facility] website at [email or web address]  There are also multiple [facility], [facility], [facility] and [facility] practices throughout the [place] that are frequently accepting new patients. You may find a clinic that is close to your home and contact them.  [place] [id number] [place]  [place] [address] [phone]   [email or web address] [place] [address] [place], [place] [phone]    [email or web address] [address] [place], [place] ([place]) [place] # [place], [place] [phone]    [facility] [email or web address] [address] [place], [place] [phone]   [facility] [email or web address] [address] [place], [place] [phone]   [facility] [email or web address], [place] [phone]

## 2016-11-22 NOTE — ED Notes (Signed)
Bed: WA21 Expected date:  Expected time:  Means of arrival:  Comments: 

## 2016-11-22 NOTE — ED Notes (Signed)
Pelvic cart set up 

## 2016-11-22 NOTE — ED Notes (Signed)
Bed: WA20 Expected date:  Expected time:  Means of arrival:  Comments: 

## 2017-01-07 ENCOUNTER — Encounter (HOSPITAL_COMMUNITY): Payer: Self-pay

## 2017-01-07 ENCOUNTER — Other Ambulatory Visit: Payer: Self-pay

## 2017-01-07 ENCOUNTER — Inpatient Hospital Stay (HOSPITAL_COMMUNITY)
Admission: AD | Admit: 2017-01-07 | Discharge: 2017-01-07 | Disposition: A | Discharge status: Home / Self Care | Payer: Medicaid Other | Source: Ambulatory Visit | Attending: Obstetrics & Gynecology | Admitting: Obstetrics & Gynecology

## 2017-01-07 DIAGNOSIS — R300 Dysuria: Secondary | ICD-10-CM | POA: Diagnosis present

## 2017-01-07 DIAGNOSIS — M545 Low back pain: Secondary | ICD-10-CM | POA: Insufficient documentation

## 2017-01-07 DIAGNOSIS — Z3202 Encounter for pregnancy test, result negative: Secondary | ICD-10-CM | POA: Diagnosis not present

## 2017-01-07 DIAGNOSIS — N390 Urinary tract infection, site not specified: Secondary | ICD-10-CM | POA: Diagnosis not present

## 2017-01-07 DIAGNOSIS — N3000 Acute cystitis without hematuria: Secondary | ICD-10-CM | POA: Diagnosis not present

## 2017-01-07 LAB — CBC WITH DIFFERENTIAL/PLATELET
Basophils Absolute: 0 10*3/uL (ref 0.0–0.1)
Basophils Relative: 1 %
Eosinophils Absolute: 0.2 10*3/uL (ref 0.0–0.7)
Eosinophils Relative: 3 %
HEMATOCRIT: 37.2 % (ref 36.0–46.0)
HEMOGLOBIN: 11.6 g/dL — AB (ref 12.0–15.0)
LYMPHS PCT: 42 %
Lymphs Abs: 3 10*3/uL (ref 0.7–4.0)
MCH: 21.9 pg — ABNORMAL LOW (ref 26.0–34.0)
MCHC: 31.2 g/dL (ref 30.0–36.0)
MCV: 70.2 fL — AB (ref 78.0–100.0)
MONO ABS: 0.2 10*3/uL (ref 0.1–1.0)
Monocytes Relative: 2 %
NEUTROS ABS: 3.7 10*3/uL (ref 1.7–7.7)
NEUTROS PCT: 52 %
Platelets: 366 10*3/uL (ref 150–400)
RBC: 5.3 MIL/uL — AB (ref 3.87–5.11)
RDW: 16 % — ABNORMAL HIGH (ref 11.5–15.5)
WBC: 7.1 10*3/uL (ref 4.0–10.5)

## 2017-01-07 LAB — URINALYSIS, ROUTINE W REFLEX MICROSCOPIC
Bilirubin Urine: NEGATIVE
Glucose, UA: NEGATIVE mg/dL
Ketones, ur: NEGATIVE mg/dL
NITRITE: NEGATIVE
Protein, ur: 100 mg/dL — AB
SPECIFIC GRAVITY, URINE: 1.027 (ref 1.005–1.030)
pH: 5 (ref 5.0–8.0)

## 2017-01-07 LAB — POCT PREGNANCY, URINE: PREG TEST UR: NEGATIVE

## 2017-01-07 MED ORDER — PHENAZOPYRIDINE HCL 200 MG PO TABS: 200.0000 mg | ORAL_TABLET | Freq: Three times a day (TID) | ORAL | 0 refills | Status: DC

## 2017-01-07 MED ORDER — IBUPROFEN 600 MG PO TABS: 600.0000 mg | ORAL_TABLET | Freq: Four times a day (QID) | ORAL | 0 refills | Status: DC

## 2017-01-07 MED ORDER — CIPROFLOXACIN HCL 250 MG PO TABS: 250.0000 mg | ORAL_TABLET | Freq: Two times a day (BID) | ORAL | 0 refills | Status: DC

## 2017-01-07 NOTE — MAU Note (Signed)
Pt says she started having back pain and abdominal pain 2 days ago and today started having right side pain also.  She describes the pain as constant and aching. She also reports having burning with urination for two days as well.

## 2017-01-07 NOTE — Discharge Instructions (Signed)

## 2017-01-07 NOTE — MAU Note (Addendum)
For 2 days having pain when I pee and going more than usual. Don't go a lot when I go. Today started having pain in R side. Some nausea and feeling weak. Denies fever or chills.

## 2017-01-07 NOTE — MAU Provider Note (Signed)
History     CSN: [id number]  Arrival date and time: 01/07/17 2006   First Provider Initiated Contact with Patient 01/07/17 2101      Chief Complaint  Patient presents with  . Dysuria  . Back Pain   HPI [name] is a 21 y.o. G2P1011 who presents to MAU today with complaint of dysuria x 2 days. She has also noted frequency and low UOP. She denies flank pain or fever. She has noted low back pain, worse with movement. She has not taken anything for pain. She denies a known injury.   OB History    Gravida Para Term Preterm AB Living   2 1 1   1 1    SAB TAB Ectopic Multiple Live Births   1 0   0 1      Past Medical History:  Diagnosis Date  . Anxiety   . Depression     Past Surgical History:  Procedure Laterality Date  . BREAST SURGERY    . MASS EXCISION  10/29/2011   Procedure: EXCISION MASS;  Surgeon: Harl Bowie, MD;  Location: [facility];  Service: General;  Laterality: Bilateral;  excision of bilateral breast masses    Family History  Problem Relation Age of Onset  . Cancer Maternal Grandmother        lung cancer  . Cancer Other        bladder cancer    Social History   Tobacco Use  . Smoking status: Never Smoker  . Smokeless tobacco: Never Used  Substance Use Topics  . Alcohol use: Yes    Alcohol/week: 1.2 - 1.8 oz    Types: 2 - 3 Shots of liquor per week    Comment: none since pregnancy  . Drug use: No    Comment: none in pregnancy    Allergies: No Known Allergies  Medications Prior to Admission  Medication Sig Dispense Refill Last Dose  . acetaminophen (TYLENOL) 500 MG tablet Take 1,000 mg by mouth every 6 (six) hours as needed for moderate pain.   01/07/2017 at Unknown time  . ferrous sulfate (FERROUSUL) 325 (65 FE) MG tablet Take 1 tablet (325 mg total) by mouth daily with breakfast. 30 tablet 3 01/07/2017 at Unknown time  . sertraline (ZOLOFT) 25 MG tablet Take 25 mg by mouth daily.   01/06/2017 at Unknown time  . FLUoxetine (PROZAC)  40 MG capsule Take 1 capsule (40 mg total) by mouth daily. (Patient not taking: Reported on 01/07/2017) 30 capsule 3 More than a month at Unknown time  . hydrocortisone (ANUSOL-HC) 2.5 % rectal cream Apply rectally 2 times daily (Patient not taking: Reported on 01/07/2017) 28.35 g 0 Not Taking at Unknown time  . metroNIDAZOLE (FLAGYL) 500 MG tablet Take 1 tablet (500 mg total) by mouth 2 (two) times daily. (Patient not taking: Reported on 01/07/2017) 28 tablet 0 More than a month at Unknown time  . norgestimate-ethinyl estradiol (ORTHO-CYCLEN,SPRINTEC,PREVIFEM) 0.25-35 MG-MCG tablet Take 1 tablet by mouth daily. (Patient not taking: Reported on 01/07/2017) 1 Package 11 Not Taking at Unknown time  . sulfamethoxazole-trimethoprim (BACTRIM DS,SEPTRA DS) 800-160 MG tablet Take 1 tablet by mouth 2 (two) times daily. (Patient not taking: Reported on 01/07/2017) 6 tablet 0 Not Taking at Unknown time    Review of Systems  Constitutional: Negative for fever.  Gastrointestinal: Positive for abdominal pain. Negative for constipation, diarrhea, nausea and vomiting.  Genitourinary: Positive for dysuria and frequency. Negative for urgency, vaginal bleeding and vaginal discharge.  Musculoskeletal: Positive for back pain.   Physical Exam   Blood pressure (!) 141/91, pulse 95, temperature 98.7 F (37.1 C), temperature source Oral, resp. rate 16, height 5\' 8"  (1.727 m), weight 260 lb (117.9 kg), last menstrual period 01/03/2017, SpO2 100 %, not currently breastfeeding.  Physical Exam  Nursing note and vitals reviewed. Constitutional: She is oriented to person, place, and time. She appears well-developed and well-nourished. No distress.  HENT:  Head: Normocephalic and atraumatic.  Cardiovascular: Normal rate.  Respiratory: Effort normal.  GI: Soft. She exhibits no distension and no mass. There is tenderness (mild suprapubic tenderness to palpation). There is no rebound, no guarding and no CVA tenderness.   Neurological: She is alert and oriented to person, place, and time.  Skin: Skin is warm and dry. No erythema.  Psychiatric: She has a normal mood and affect.     Results for orders placed or performed during the hospital encounter of 01/07/17 (from the past 24 hour(s))  Urinalysis, Routine w reflex microscopic     Status: Abnormal   Collection Time: 01/07/17  8:30 PM  Result Value Ref Range   Color, Urine YELLOW YELLOW   APPearance CLOUDY (A) CLEAR   Specific Gravity, Urine 1.027 1.005 - 1.030   pH 5.0 5.0 - 8.0   Glucose, UA NEGATIVE NEGATIVE mg/dL   Hgb urine dipstick MODERATE (A) NEGATIVE   Bilirubin Urine NEGATIVE NEGATIVE   Ketones, ur NEGATIVE NEGATIVE mg/dL   Protein, ur 100 (A) NEGATIVE mg/dL   Nitrite NEGATIVE NEGATIVE   Leukocytes, UA LARGE (A) NEGATIVE   RBC / HPF TOO NUMEROUS TO COUNT 0 - 5 RBC/hpf   WBC, UA TOO NUMEROUS TO COUNT 0 - 5 WBC/hpf   Bacteria, UA FEW (A) NONE SEEN   Squamous Epithelial / LPF 0-5 (A) NONE SEEN   Mucus PRESENT    Non Squamous Epithelial 0-5 (A) NONE SEEN  Pregnancy, urine POC     Status: None   Collection Time: 01/07/17  8:50 PM  Result Value Ref Range   Preg Test, Ur NEGATIVE NEGATIVE  CBC with Differential/Platelet     Status: Abnormal   Collection Time: 01/07/17  8:58 PM  Result Value Ref Range   WBC 7.1 4.0 - 10.5 K/uL   RBC 5.30 (H) 3.87 - 5.11 MIL/uL   Hemoglobin 11.6 (L) 12.0 - 15.0 g/dL   HCT 37.2 36.0 - 46.0 %   MCV 70.2 (L) 78.0 - 100.0 fL   MCH 21.9 (L) 26.0 - 34.0 pg   MCHC 31.2 30.0 - 36.0 g/dL   RDW 16.0 (H) 11.5 - 15.5 %   Platelets 366 150 - 400 K/uL   Neutrophils Relative % 52 %   Neutro Abs 3.7 1.7 - 7.7 K/uL   Lymphocytes Relative 42 %   Lymphs Abs 3.0 0.7 - 4.0 K/uL   Monocytes Relative 2 %   Monocytes Absolute 0.2 0.1 - 1.0 K/uL   Eosinophils Relative 3 %   Eosinophils Absolute 0.2 0.0 - 0.7 K/uL   Basophils Relative 1 %   Basophils Absolute 0.0 0.0 - 0.1 K/uL     MAU Course  Procedures  MDM UPT -  negative UA, CBC today  Urine culture ordered   Assessment and Plan  A: UTI, acute MSK back pain  P: Discharge home Rx for Cipro, Pyridium and Ibuprofen given to patient  Ice to the back and rest advised Warning signs for pyelonephritis discussed Patient advised to follow-up with PCP of choice. Contact  information for [facility] given.  Patient may return to MAU as needed or if her condition were to change or worsen  Kerry Hough, PA-C 01/07/2017, 9:24 PM

## 2017-01-10 LAB — URINE CULTURE

## 2017-02-08 ENCOUNTER — Telehealth: Payer: Self-pay | Admitting: General Practice

## 2017-02-08 NOTE — Telephone Encounter (Signed)
Called and notified patient of appointment scheduled for 03/01/17 at 9:35am.  Patient voiced understanding.

## 2017-02-11 ENCOUNTER — Encounter: Payer: Self-pay | Admitting: *Deleted

## 2017-02-24 ENCOUNTER — Other Ambulatory Visit: Payer: Self-pay

## 2017-02-24 ENCOUNTER — Emergency Department (HOSPITAL_COMMUNITY)
Admission: EM | Admit: 2017-02-24 | Discharge: 2017-02-25 | Disposition: A | Discharge status: Home / Self Care | Payer: Medicaid Other | Attending: Emergency Medicine | Admitting: Emergency Medicine

## 2017-02-24 ENCOUNTER — Encounter (HOSPITAL_COMMUNITY): Payer: Self-pay | Admitting: Emergency Medicine

## 2017-02-24 DIAGNOSIS — Z202 Contact with and (suspected) exposure to infections with a predominantly sexual mode of transmission: Secondary | ICD-10-CM | POA: Diagnosis not present

## 2017-02-24 DIAGNOSIS — Z113 Encounter for screening for infections with a predominantly sexual mode of transmission: Secondary | ICD-10-CM

## 2017-02-24 DIAGNOSIS — R300 Dysuria: Secondary | ICD-10-CM | POA: Insufficient documentation

## 2017-02-24 LAB — URINALYSIS, ROUTINE W REFLEX MICROSCOPIC
BACTERIA UA: NONE SEEN
Bilirubin Urine: NEGATIVE
Glucose, UA: NEGATIVE mg/dL
Ketones, ur: NEGATIVE mg/dL
Leukocytes, UA: NEGATIVE
Nitrite: NEGATIVE
Protein, ur: NEGATIVE mg/dL
Specific Gravity, Urine: 1.018 (ref 1.005–1.030)
pH: 5 (ref 5.0–8.0)

## 2017-02-24 LAB — PREGNANCY, URINE: PREG TEST UR: NEGATIVE

## 2017-02-24 NOTE — ED Triage Notes (Signed)
Pt is c/o burning with urination  Pt also states she has a white vaginal discharge that started 4 days ago Pt states she wants to be checked for STDs because she has had unprotected sex

## 2017-02-25 LAB — WET PREP, GENITAL
CLUE CELLS WET PREP: NONE SEEN
Sperm: NONE SEEN
TRICH WET PREP: NONE SEEN
Yeast Wet Prep HPF POC: NONE SEEN

## 2017-02-25 MED ORDER — METRONIDAZOLE 500 MG PO TABS: 500.0000 mg | ORAL_TABLET | Freq: Two times a day (BID) | ORAL | 0 refills | Status: DC

## 2017-02-25 MED ORDER — CEPHALEXIN 500 MG PO CAPS: 500.0000 mg | ORAL_CAPSULE | Freq: Two times a day (BID) | ORAL | 0 refills | Status: DC

## 2017-02-25 MED ORDER — AZITHROMYCIN 250 MG PO TABS
1000.0000 mg | ORAL_TABLET | Freq: Once | ORAL | Status: AC
  Administered 2017-02-25: 1000 mg via ORAL
  Filled 2017-02-25: qty 4

## 2017-02-25 MED ORDER — STERILE WATER FOR INJECTION IJ SOLN
INTRAMUSCULAR | Status: AC
  Administered 2017-02-25: 10 mL
  Filled 2017-02-25: qty 10

## 2017-02-25 MED ORDER — CEFTRIAXONE SODIUM 250 MG IJ SOLR
250.0000 mg | Freq: Once | INTRAMUSCULAR | Status: AC
  Administered 2017-02-25: 250 mg via INTRAMUSCULAR
  Filled 2017-02-25 (×2): qty 250

## 2017-02-25 NOTE — ED Provider Notes (Signed)
[facility] DEPT Provider Note   CSN: [id number] Arrival date & time: 02/24/17  2218     History   Chief Complaint Chief Complaint  Patient presents with  . Dysuria  . Vaginal Discharge    HPI Katrina Stewart is a 22 y.o. female.  Patient presents to the emergency department with chief complaint of dysuria.  She states that symptoms started this morning.  She also reports having a new vaginal odor.  She denies fevers, chills, nausea, vomiting, or abdominal pain.  She denies having taken anything for symptoms.  Denies any other associated symptoms.  She would like to be checked for STDs.   The history is provided by the patient. No language interpreter was used.    Past Medical History:  Diagnosis Date  . Anxiety   . Depression     Patient Active Problem List   Diagnosis Date Noted  . Rubella non-immune status, antepartum 09/03/2015  . Severe episode of recurrent major depressive disorder, without psychotic features ([place])   . Alcohol use disorder     Past Surgical History:  Procedure Laterality Date  . BREAST SURGERY    . MASS EXCISION  10/29/2011   Procedure: EXCISION MASS;  Surgeon: Harl Bowie, MD;  Location: [facility];  Service: General;  Laterality: Bilateral;  excision of bilateral breast masses    OB History    Gravida Para Term Preterm AB Living   2 1 1   1 1    SAB TAB Ectopic Multiple Live Births   1 0   0 1       Home Medications    Prior to Admission medications   Medication Sig Start Date End Date Taking? Authorizing Provider  acetaminophen (TYLENOL) 500 MG tablet Take 1,000 mg by mouth every 6 (six) hours as needed for moderate pain.    [provider]  ciprofloxacin (CIPRO) 250 MG tablet Take 1 tablet (250 mg total) by mouth every 12 (twelve) hours. 01/07/17   Luvenia Redden, PA-C  ferrous sulfate (FERROUSUL) 325 (65 FE) MG tablet Take 1 tablet (325 mg total) by mouth daily with breakfast. 12/10/15    Chancy Milroy, MD  ibuprofen (ADVIL,MOTRIN) 600 MG tablet Take 1 tablet (600 mg total) by mouth every 6 (six) hours. 01/07/17   Luvenia Redden, PA-C  phenazopyridine (PYRIDIUM) 200 MG tablet Take 1 tablet (200 mg total) by mouth 3 (three) times daily. 01/07/17   Luvenia Redden, PA-C  sertraline (ZOLOFT) 25 MG tablet Take 25 mg by mouth daily.    [provider]    Family History Family History  Problem Relation Age of Onset  . Cancer Maternal Grandmother        lung cancer  . Cancer Other        bladder cancer    Social History Social History   Tobacco Use  . Smoking status: Never Smoker  . Smokeless tobacco: Never Used  Substance Use Topics  . Alcohol use: Yes    Alcohol/week: 1.2 - 1.8 oz    Types: 2 - 3 Shots of liquor per week    Comment: none since pregnancy  . Drug use: No     Allergies   Patient has no known allergies.   Review of Systems Review of Systems  All other systems reviewed and are negative.    Physical Exam Updated Vital Signs BP (!) 148/97 (BP Location: Right Arm)   Pulse (!) 114   Temp 98.4  F (36.9 C) (Oral)   Resp 19   LMP 01/30/2017 (Exact Date)   SpO2 100%   Physical Exam  Constitutional: She is oriented to person, place, and time. She appears well-developed and well-nourished.  HENT:  Head: Normocephalic and atraumatic.  Eyes: Conjunctivae and EOM are normal. Pupils are equal, round, and reactive to light.  Neck: Normal range of motion. Neck supple.  Cardiovascular: Normal rate and regular rhythm. Exam reveals no gallop and no friction rub.  No murmur heard. Pulmonary/Chest: Effort normal and breath sounds normal. No respiratory distress. She has no wheezes. She has no rales. She exhibits no tenderness.  Abdominal: Soft. Bowel sounds are normal. She exhibits no distension and no mass. There is no tenderness. There is no rebound and no guarding.  Genitourinary:  Genitourinary Comments: Pelvic exam chaperoned by female ER  tech, no right or left adnexal tenderness, no uterine tenderness, no vaginal discharge, no bleeding, no CMT or friability, no foreign body, no injury to the external genitalia, no other significant findings   Musculoskeletal: Normal range of motion. She exhibits no edema or tenderness.  Neurological: She is alert and oriented to person, place, and time.  Skin: Skin is warm and dry.  Psychiatric: She has a normal mood and affect. Her behavior is normal. Judgment and thought content normal.  Nursing note and vitals reviewed.    ED Treatments / Results  Labs (all labs ordered are listed, but only abnormal results are displayed) Labs Reviewed  URINALYSIS, ROUTINE W REFLEX MICROSCOPIC - Abnormal; Notable for the following components:      Result Value   Hgb urine dipstick MODERATE (*)    Squamous Epithelial / LPF 0-5 (*)    All other components within normal limits  WET PREP, GENITAL  URINE CULTURE  PREGNANCY, URINE  GC/CHLAMYDIA PROBE AMP ([place]) NOT AT [facility]    EKG  EKG Interpretation None       Radiology No results found.  Procedures Procedures (including critical care time)  Medications Ordered in ED Medications - No data to display   Initial Impression / Assessment and Plan / ED Course  I have reviewed the triage vital signs and the nursing notes.  Pertinent labs & imaging results that were available during my care of the patient were reviewed by me and considered in my medical decision making (see chart for details).     Patient with dysuria and vaginal odor.  She would like to be checked for STDs.  Her urinalysis is not consistent with UTI, but her symptoms could be.  Will check urine culture.  Gonorrhea and Chlamydia testing is pending.  Will treat prophylactically.  Final Clinical Impressions(s) / ED Diagnoses   Final diagnoses:  Dysuria  Screen for STD (sexually transmitted disease)    ED Discharge Orders    None       Montine Circle,  PA-C 02/25/17 0247    Sherwood Gambler, MD 02/25/17 [phone]

## 2017-02-26 LAB — URINE CULTURE

## 2017-02-28 LAB — GC/CHLAMYDIA PROBE AMP (~~LOC~~) NOT AT ARMC
Chlamydia: NEGATIVE
NEISSERIA GONORRHEA: NEGATIVE

## 2017-03-01 ENCOUNTER — Ambulatory Visit (INDEPENDENT_AMBULATORY_CARE_PROVIDER_SITE_OTHER): Payer: Medicaid Other | Admitting: Obstetrics & Gynecology

## 2017-03-01 ENCOUNTER — Encounter: Payer: Self-pay | Admitting: *Deleted

## 2017-03-01 ENCOUNTER — Encounter: Payer: Self-pay | Admitting: Obstetrics & Gynecology

## 2017-03-01 VITALS — Wt 263.0 lb

## 2017-03-01 DIAGNOSIS — R87610 Atypical squamous cells of undetermined significance on cytologic smear of cervix (ASC-US): Secondary | ICD-10-CM

## 2017-03-01 LAB — HM PAP SMEAR

## 2017-03-01 NOTE — Progress Notes (Signed)
History:  22 y.o. G2P1011 here today for eval of an abnormal PAP.   Pts PAP was fax from [facility]. It showed ASCUS. Pt has no sx. Her PAP was done in Nov 2018.    The following portions of the patient's history were reviewed and updated as appropriate: allergies, current medications, past family history, past medical history, past social history, past surgical history and problem list.  Review of Systems:  Pertinent items are noted in HPI.   Objective:  Physical Exam Last menstrual period 01/30/2017, not currently breastfeeding.  CONSTITUTIONAL: Well-developed, well-nourished female in no acute distress.  HENT:  Normocephalic, atraumatic EYES: Conjunctivae and EOM are normal. No scleral icterus.  NECK: Normal range of motion SKIN: Skin is warm and dry. No rash noted. Not diaphoretic.No pallor. [place]: Alert and oriented to person, place, and time. Normal coordination.   Labs and Imaging 12/2016 ASCUS   Assessment & Plan:  ASCUS in 22 yo  I reviewed the results and the significance with the pt Rec f/u PAP in 10 months. (1 year from Abnormal PAP)  Total face-to-face time with patient was 10 min.  Greater than 50% was spent in counseling and coordination of care with the patient.   Rainbow Salman L. Harraway-Smith, M.D., [place]lynn June

## 2017-03-01 NOTE — Patient Instructions (Signed)

## 2017-03-02 LAB — POCT PREGNANCY, URINE: PREG TEST UR: NEGATIVE

## 2017-03-29 ENCOUNTER — Other Ambulatory Visit: Payer: Self-pay

## 2017-03-29 ENCOUNTER — Inpatient Hospital Stay (HOSPITAL_COMMUNITY)
Admission: AD | Admit: 2017-03-29 | Discharge: 2017-03-29 | Disposition: A | Discharge status: Home / Self Care | Payer: Medicaid Other | Source: Ambulatory Visit | Attending: Obstetrics & Gynecology | Admitting: Obstetrics & Gynecology

## 2017-03-29 ENCOUNTER — Encounter (HOSPITAL_COMMUNITY): Payer: Self-pay

## 2017-03-29 DIAGNOSIS — F419 Anxiety disorder, unspecified: Secondary | ICD-10-CM | POA: Diagnosis not present

## 2017-03-29 DIAGNOSIS — Z79899 Other long term (current) drug therapy: Secondary | ICD-10-CM | POA: Insufficient documentation

## 2017-03-29 DIAGNOSIS — F329 Major depressive disorder, single episode, unspecified: Secondary | ICD-10-CM | POA: Insufficient documentation

## 2017-03-29 DIAGNOSIS — N939 Abnormal uterine and vaginal bleeding, unspecified: Secondary | ICD-10-CM | POA: Diagnosis not present

## 2017-03-29 LAB — CBC
HCT: 34.7 % — ABNORMAL LOW (ref 36.0–46.0)
Hemoglobin: 11 g/dL — ABNORMAL LOW (ref 12.0–15.0)
MCH: 21.7 pg — ABNORMAL LOW (ref 26.0–34.0)
MCHC: 31.7 g/dL (ref 30.0–36.0)
MCV: 68.3 fL — ABNORMAL LOW (ref 78.0–100.0)
PLATELETS: 333 10*3/uL (ref 150–400)
RBC: 5.08 MIL/uL (ref 3.87–5.11)
RDW: 16.5 % — ABNORMAL HIGH (ref 11.5–15.5)
WBC: 7 10*3/uL (ref 4.0–10.5)

## 2017-03-29 LAB — POCT PREGNANCY, URINE: Preg Test, Ur: NEGATIVE

## 2017-03-29 LAB — URINALYSIS, ROUTINE W REFLEX MICROSCOPIC
BILIRUBIN URINE: NEGATIVE
GLUCOSE, UA: NEGATIVE mg/dL
Ketones, ur: NEGATIVE mg/dL
LEUKOCYTES UA: NEGATIVE
NITRITE: NEGATIVE
PROTEIN: NEGATIVE mg/dL
SPECIFIC GRAVITY, URINE: 1.014 (ref 1.005–1.030)
pH: 5 (ref 5.0–8.0)

## 2017-03-29 LAB — WET PREP, GENITAL
Sperm: NONE SEEN
Trich, Wet Prep: NONE SEEN
Yeast Wet Prep HPF POC: NONE SEEN

## 2017-03-29 MED ORDER — METRONIDAZOLE 500 MG PO TABS: 500.0000 mg | ORAL_TABLET | Freq: Two times a day (BID) | ORAL | 0 refills | Status: AC

## 2017-03-29 MED ORDER — MEDROXYPROGESTERONE ACETATE 10 MG PO TABS: 20.0000 mg | ORAL_TABLET | Freq: Three times a day (TID) | ORAL | 0 refills | Status: DC

## 2017-03-29 MED ORDER — NORGESTIMATE-ETH ESTRADIOL 0.25-35 MG-MCG PO TABS: 1.0000 | ORAL_TABLET | Freq: Every day | ORAL | 11 refills | Status: DC

## 2017-03-29 NOTE — MAU Note (Signed)
Been bleeding for about 10 days now, heavy.  Changing every hour, heavy since day 4, quarter sized clots. Never had bleeding like this before.  Started before expected period

## 2017-03-29 NOTE — Discharge Instructions (Signed)
-  take provera 20 mg (2 tablets) morning, noon and evening. Do this for 7 days.   Abnormal Uterine Bleeding Abnormal uterine bleeding means bleeding more than usual from your uterus. It can include:  Bleeding between periods.  Bleeding after sex.  Bleeding that is heavier than normal.  Periods that last longer than usual.  Bleeding after you have stopped having your period (menopause).  There are many problems that may cause this. You should see a doctor for any kind of bleeding that is not normal. Treatment depends on the cause of the bleeding. Follow these instructions at home:  Watch your condition for any changes.  Do not use tampons, douche, or have sex, if your doctor tells you not to.  Change your pads often.  Get regular well-woman exams. Make sure they include a pelvic exam and cervical cancer screening.  Keep all follow-up visits as told by your doctor. This is important. Contact a doctor if:  The bleeding lasts more than one week.  You feel dizzy at times.  You feel like you are going to throw up (nauseous).  You throw up. Get help right away if:  You pass out.  You have to change pads every hour.  You have belly (abdominal) pain.  You have a fever.  You get sweaty.  You get weak.  You passing large blood clots from your vagina. Summary  Abnormal uterine bleeding means bleeding more than usual from your uterus.  There are many problems that may cause this. You should see a doctor for any kind of bleeding that is not normal.  Treatment depends on the cause of the bleeding. This information is not intended to replace advice given to you by your health care provider. Make sure you discuss any questions you have with your health care provider. Document Released: 11/15/2008 Document Revised: 01/13/2016 Document Reviewed: 01/13/2016 Elsevier Interactive Patient Education  2017 [employer].

## 2017-03-29 NOTE — MAU Provider Note (Addendum)
Patient Katrina Stewart is a 22 y.o. G86P1011 non-pregnant female here with complaints of abnormal uterine bleeding. She denies abnormal discharge prior to this episode; denies dysuria, low back pain, pain during intercourse or other gyn complaint.  History     CSN: [id number]  Arrival date and time: 03/29/17 1351   None     Chief Complaint  Patient presents with  . Vaginal Bleeding   Vaginal Bleeding  The patient's primary symptoms include vaginal bleeding. The patient's pertinent negatives include no genital itching, genital lesions, genital odor or pelvic pain. This is a new problem. The current episode started 1 to 4 weeks ago. The problem occurs constantly. The problem has been gradually improving. The pain is mild. Associated symptoms include abdominal pain. Associated symptoms comments: Cramping like her period. . The vaginal discharge was bloody. The vaginal bleeding is typical of menses. She has been passing clots. She has not been passing tissue. Nothing aggravates the symptoms. She has tried nothing for the symptoms.    OB History    Gravida Para Term Preterm AB Living   2 1 1   1 1    SAB TAB Ectopic Multiple Live Births   1 0   0 1      Past Medical History:  Diagnosis Date  . Anxiety   . Depression     Past Surgical History:  Procedure Laterality Date  . BREAST SURGERY    . MASS EXCISION  10/29/2011   Procedure: EXCISION MASS;  Surgeon: Harl Bowie, MD;  Location: [facility];  Service: General;  Laterality: Bilateral;  excision of bilateral breast masses    Family History  Problem Relation Age of Onset  . Cancer Maternal Grandmother        lung cancer  . Cancer Other        bladder cancer    Social History   Tobacco Use  . Smoking status: Never Smoker  . Smokeless tobacco: Never Used  Substance Use Topics  . Alcohol use: No    Alcohol/week: 1.2 - 1.8 oz    Types: 2 - 3 Shots of liquor per week    Frequency: Never  . Drug use: No    Allergies:  No Known Allergies  Medications Prior to Admission  Medication Sig Dispense Refill Last Dose  . acetaminophen (TYLENOL) 500 MG tablet Take 1,000 mg by mouth every 6 (six) hours as needed for moderate pain.   03/28/2017 at Unknown time  . ferrous sulfate (FERROUSUL) 325 (65 FE) MG tablet Take 1 tablet (325 mg total) by mouth daily with breakfast. 30 tablet 3 Past Week at Unknown time  . ibuprofen (ADVIL,MOTRIN) 600 MG tablet Take 1 tablet (600 mg total) by mouth every 6 (six) hours. (Patient taking differently: Take 1,200 mg by mouth every 6 (six) hours as needed for mild pain. ) 30 tablet 0 03/28/2017 at Unknown time  . sertraline (ZOLOFT) 50 MG tablet Take 50 mg by mouth daily.   Past Week at Unknown time  . Vitamin D, Ergocalciferol, (DRISDOL) 50000 units CAPS capsule Take 50,000 Units by mouth every 7 (seven) days. Wednesday   Past Week at Unknown time  . cephALEXin (KEFLEX) 500 MG capsule Take 1 capsule (500 mg total) by mouth 2 (two) times daily. (Patient not taking: Reported on 03/29/2017) 14 capsule 0 Not Taking at Unknown time  . ciprofloxacin (CIPRO) 250 MG tablet Take 1 tablet (250 mg total) by mouth every 12 (twelve) hours. (Patient not taking: Reported on  03/29/2017) 10 tablet 0 Not Taking at Unknown time  . phenazopyridine (PYRIDIUM) 200 MG tablet Take 1 tablet (200 mg total) by mouth 3 (three) times daily. (Patient not taking: Reported on 03/29/2017) 6 tablet 0 Not Taking at Unknown time    Review of Systems  Constitutional: Negative.   HENT: Negative.   Respiratory: Negative.   Cardiovascular: Negative.   Gastrointestinal: Positive for abdominal pain.  Genitourinary: Positive for vaginal bleeding. Negative for pelvic pain.  Musculoskeletal: Negative.   Psychiatric/Behavioral: Negative.    Physical Exam   Blood pressure 140/90, pulse 89, temperature 99 F (37.2 C), temperature source Oral, resp. rate 18, weight 266 lb 4 oz (120.8 kg), last menstrual period 03/18/2017, SpO2 100 %,  not currently breastfeeding.  Physical Exam  Constitutional: She is oriented to person, place, and time. She appears well-developed and well-nourished.  HENT:  Head: Normocephalic.  Eyes: Pupils are equal, round, and reactive to light.  Neck: Normal range of motion.  Respiratory: Effort normal.  GI: Soft.  Genitourinary:  Genitourinary Comments: Normal external genitalia; no lesions on vaginal walls. Moderate amount of dark red blood in the vagina and blood extruding from the os; no odor. No CMT, suprapubic or adnexal tenderness.   Musculoskeletal: Normal range of motion.  Neurological: She is alert and oriented to person, place, and time.  Skin: Skin is warm and dry.  Psychiatric: She has a normal mood and affect.    MAU Course  Procedures  MDM -CBC: normal -wet prep  Positive for clue cells -GC Ct pending Counseled patient that bleeding seems typical of menses, although for a longer duration than usual.   Assessment and Plan   1. Abnormal uterine bleeding    2. Patient stable for discharge with RX for provera 20 mg three times a day a day until bleeding stops.   3. RX for Flagyl given.  4. Outpatient [place] ordered; patient to have appt with provider afterwards; message sent to clinic [facility] (patient preference).   5. All questions answered; patient stable for discharge.    Mervyn Skeeters K[place]ra CNM 03/29/2017, 5:50 PM

## 2017-03-30 LAB — GC/CHLAMYDIA PROBE AMP (~~LOC~~) NOT AT ARMC
Chlamydia: NEGATIVE
Neisseria Gonorrhea: NEGATIVE

## 2017-04-07 ENCOUNTER — Ambulatory Visit: Payer: Medicaid Other | Admitting: Obstetrics and Gynecology

## 2017-04-08 NOTE — Progress Notes (Signed)
Patient not seen.

## 2017-05-05 ENCOUNTER — Other Ambulatory Visit: Payer: Self-pay | Admitting: Pediatrics

## 2017-05-05 DIAGNOSIS — Z207 Contact with and (suspected) exposure to pediculosis, acariasis and other infestations: Secondary | ICD-10-CM

## 2017-05-05 DIAGNOSIS — Z2089 Contact with and (suspected) exposure to other communicable diseases: Secondary | ICD-10-CM

## 2017-05-05 MED ORDER — PERMETHRIN 5 % EX CREA
1.0000 | TOPICAL_CREAM | Freq: Once | CUTANEOUS | 0 refills | Status: AC
Start: 2017-05-05 — End: 2017-05-05

## 2017-05-05 NOTE — Progress Notes (Signed)
Household contact seen in clinic today with recurrent scabies.  Entire household is being treated at the same time

## 2017-05-17 ENCOUNTER — Encounter: Payer: Self-pay | Admitting: *Deleted

## 2017-06-02 DIAGNOSIS — N920 Excessive and frequent menstruation with regular cycle: Secondary | ICD-10-CM | POA: Diagnosis not present

## 2017-06-02 DIAGNOSIS — R87610 Atypical squamous cells of undetermined significance on cytologic smear of cervix (ASC-US): Secondary | ICD-10-CM | POA: Diagnosis not present

## 2017-06-02 DIAGNOSIS — Z113 Encounter for screening for infections with a predominantly sexual mode of transmission: Secondary | ICD-10-CM | POA: Diagnosis not present

## 2017-06-02 DIAGNOSIS — N945 Secondary dysmenorrhea: Secondary | ICD-10-CM | POA: Diagnosis not present

## 2017-10-04 IMAGING — US US OB TRANSVAGINAL
1 series · 15 of 28 positions shown · non-contrast
Comparison: 06/23/2015

CLINICAL DATA: First trimester pregnancy with inconclusive fetal
viability. Pelvic pain. Gestational age by LMP of 6 weeks 6 days.

EXAM:
TRANSVAGINAL OB ULTRASOUND
TECHNIQUE: Transvaginal ultrasound was performed for complete evaluation of the
gestation as well as the maternal uterus, adnexal regions, and
pelvic cul-de-sac.

[Series 1: us ob transvaginal · 36 acquisitions, 15 frames shown]
[im 1/36]
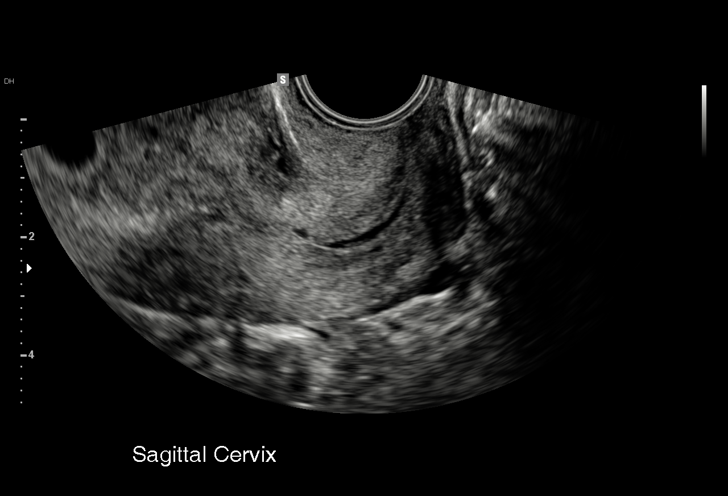
[im 3/36]
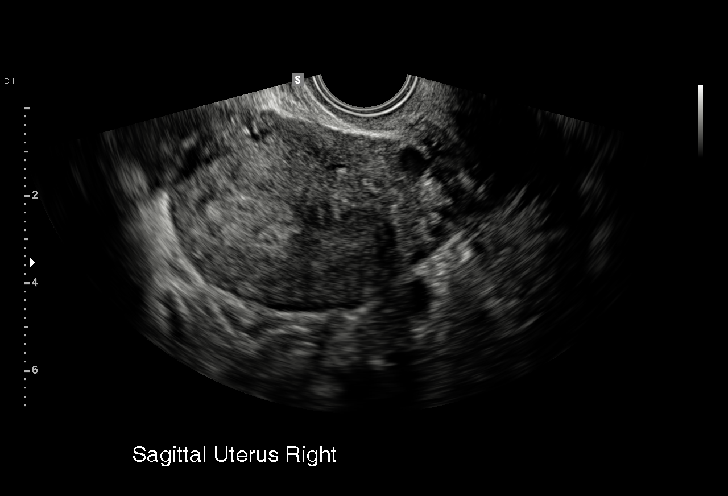
[im 6/36]
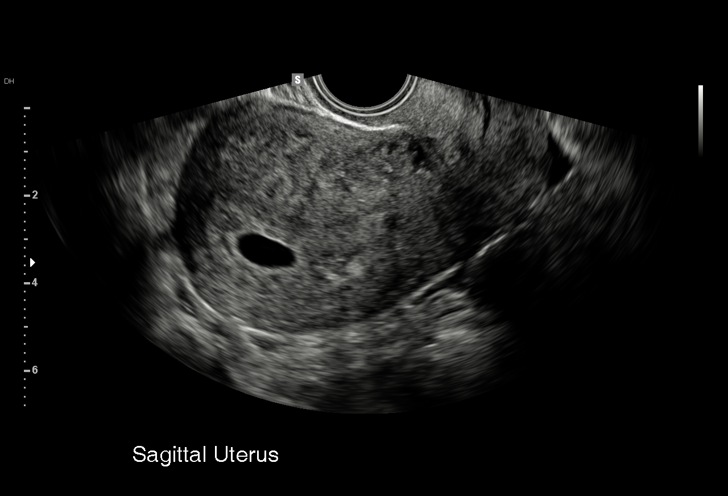
[im 8/36]
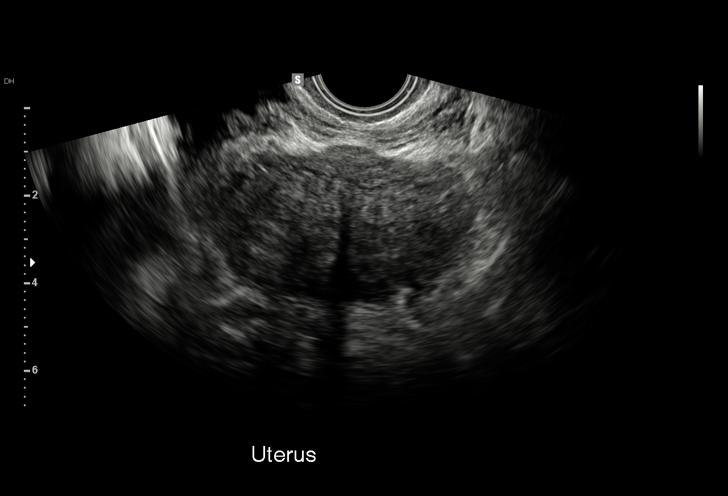
[im 11/36]
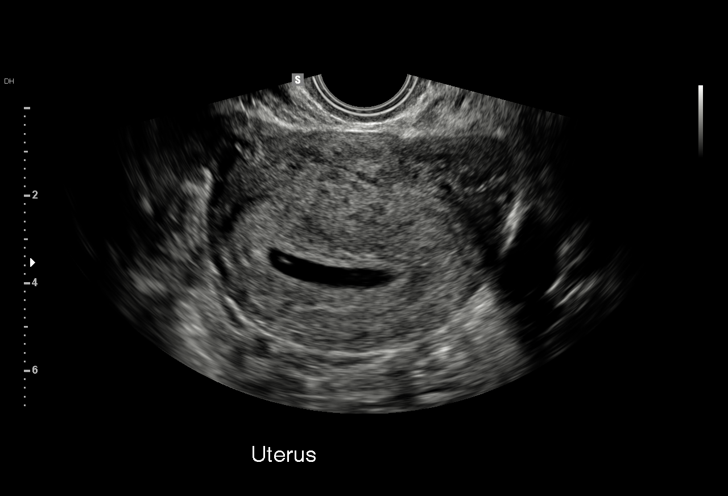
[im 13/36]
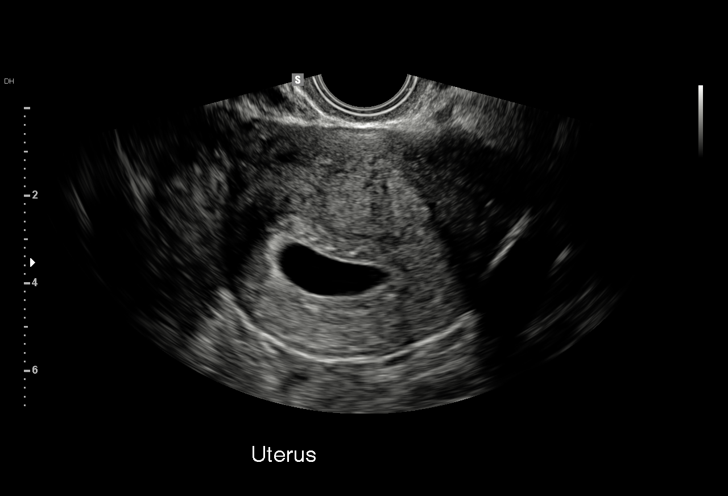
[im 16/36]
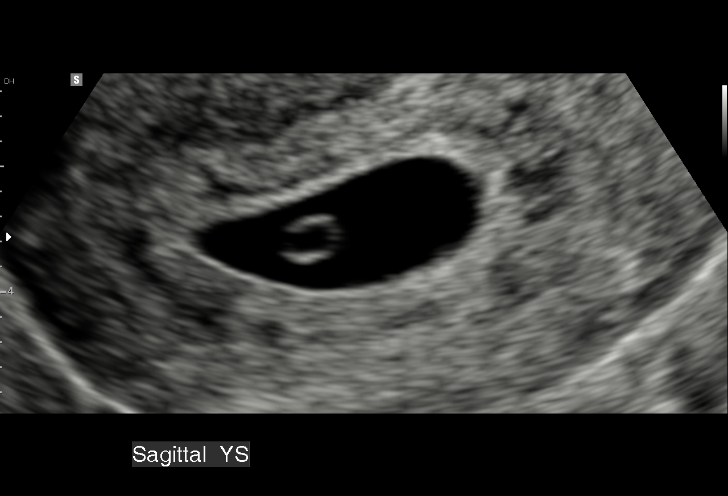
[im 19/36]
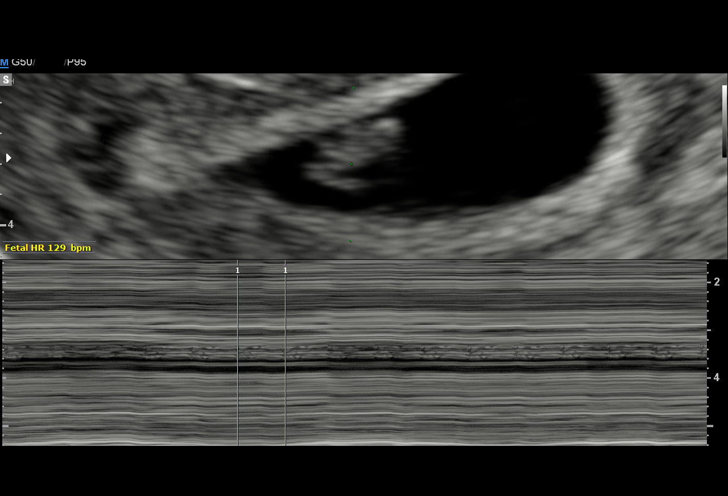
[im 20/36]
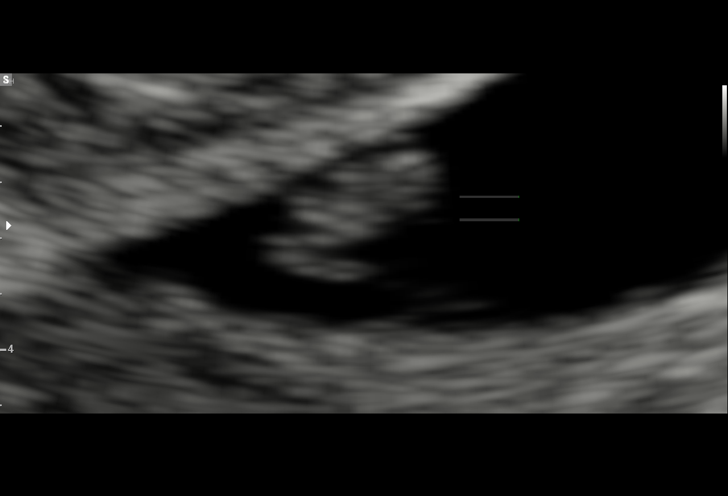
[im 23/36]
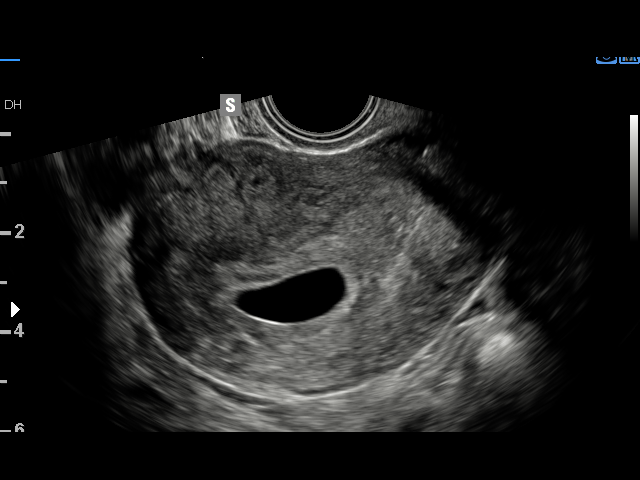
[im 25/36]
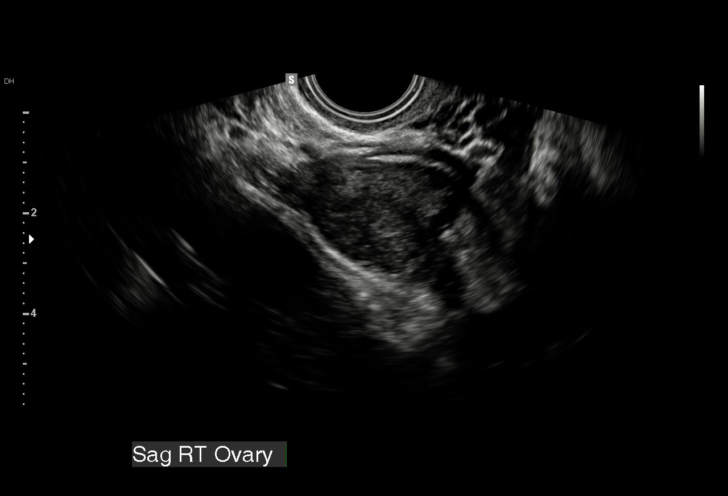
[im 28/36]
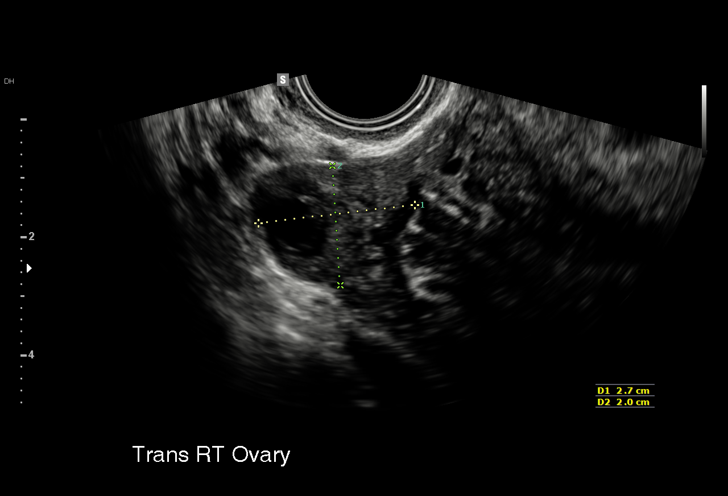
[im 30/36]
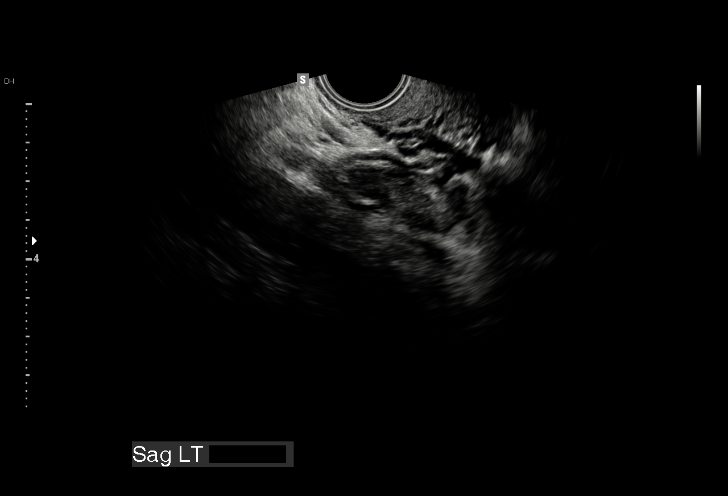
[im 33/36]
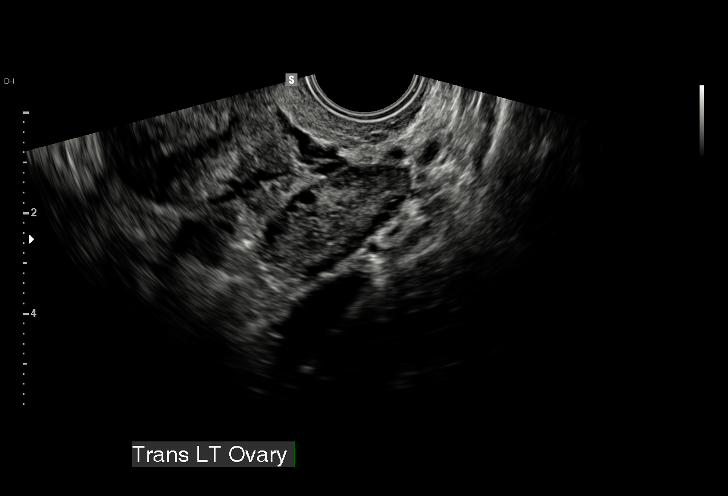
[im 36/36]
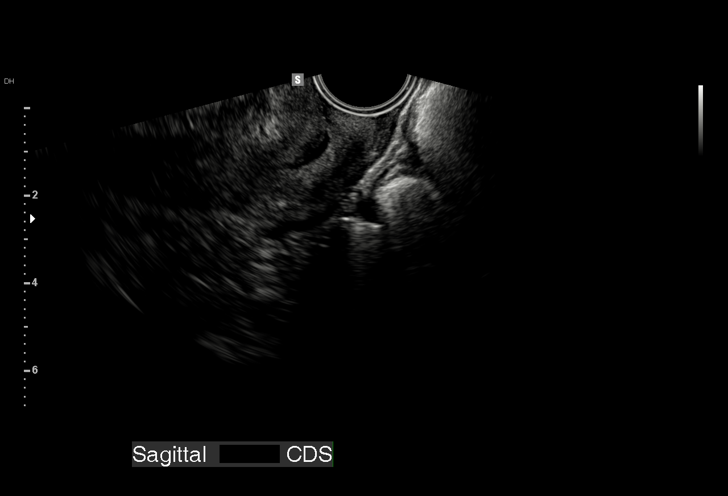

[15 of 28 positions shown; findings below may reference images not displayed]

FINDINGS: Intrauterine gestational sac: Single

Yolk sac:  Visualized

Embryo:  Visualized

Cardiac Activity: Visualized

Heart Rate: 129 bpm

CRL:   7  mm   6 w 4 d                  US EDC: 02/21/2016

Subchorionic hemorrhage:  None visualized.

Maternal uterus/adnexae: A second 5 mm cystic sac-like structure is
seen along the superior aspect of the living IUP. This is suspicious
for a failed/vanishing twin gestation.

Both ovaries are normal in appearance. No adnexal mass or free fluid
identified.
IMPRESSION: Single living IUP measuring 6 weeks 4 days with US EDC of
02/21/2016. This is concordant with LMP.

5 mm sac-like structure along superior aspect of the living IUP,
suspicious for failed/vanishing twin gestation.

## 2018-01-12 ENCOUNTER — Emergency Department (HOSPITAL_COMMUNITY)
Admission: EM | Admit: 2018-01-12 | Discharge: 2018-01-12 | Disposition: A | Discharge status: Home / Self Care | Payer: Medicaid Other | Attending: Emergency Medicine | Admitting: Emergency Medicine

## 2018-01-12 ENCOUNTER — Encounter (HOSPITAL_COMMUNITY): Payer: Self-pay | Admitting: Emergency Medicine

## 2018-01-12 DIAGNOSIS — J069 Acute upper respiratory infection, unspecified: Secondary | ICD-10-CM | POA: Insufficient documentation

## 2018-01-12 DIAGNOSIS — R05 Cough: Secondary | ICD-10-CM | POA: Insufficient documentation

## 2018-01-12 DIAGNOSIS — H9202 Otalgia, left ear: Secondary | ICD-10-CM | POA: Insufficient documentation

## 2018-01-12 MED ORDER — BENZONATATE 100 MG PO CAPS: 100.0000 mg | ORAL_CAPSULE | Freq: Three times a day (TID) | ORAL | 0 refills | Status: DC

## 2018-01-12 MED ORDER — NAPROXEN 500 MG PO TABS: 500.0000 mg | ORAL_TABLET | Freq: Two times a day (BID) | ORAL | 0 refills | Status: DC

## 2018-01-12 MED ORDER — AMOXICILLIN 500 MG PO CAPS: 500.0000 mg | ORAL_CAPSULE | Freq: Three times a day (TID) | ORAL | 0 refills | Status: DC

## 2018-01-12 MED ORDER — FLUTICASONE PROPIONATE 50 MCG/ACT NA SUSP: 1.0000 | Freq: Every day | NASAL | 0 refills | Status: DC

## 2018-01-12 NOTE — Discharge Instructions (Addendum)
You were seen in the emergency today for upper respiratory symptoms, The following are medicines to help treat your symptoms.   -Flonase to be used 1 spray in each nostril daily.  This medication is used to treat your congestion.  -Tessalon can be taken once every 8 hours as needed.  This medication is used to treat your cough.  -- Naproxen is a nonsteroidal anti-inflammatory medication that will help with pain and swelling. Be sure to take this medication as prescribed with food, 1 pill every 12 hours,  It should be taken with food, as it can cause stomach upset, and more seriously, stomach bleeding. Do not take other nonsteroidal anti-inflammatory medications with this such as Advil, Motrin, Aleve, Mobic, Goodie Powder, or Motrin.    You make take Tylenol per over the counter dosing with these medications.    If your ear pain is not improved in 48 hours please start the prescription for amoxicillin- the antibiotic- to treat an ear infection. If your discomfort is improving please disregard this prescription.   We have prescribed you new medication(s) today. Discuss the medications prescribed today with your pharmacist as they can have adverse effects and interactions with your other medicines including over the counter and prescribed medications. Seek medical evaluation if you start to experience new or abnormal symptoms after taking one of these medicines, seek care immediately if you start to experience difficulty breathing, feeling of your throat closing, facial swelling, or rash as these could be indications of a more serious allergic reaction  You will need to follow-up with your primary care provider in 1 week if your symptoms have not improved.  If you do not have a primary care provider one is provided in your discharge instructions.  Return to the emergency department for any new or worsening symptoms including but not limited to persistent fever for 5 days, difficulty breathing, chest  pain, rashes, passing out, or any other concerns.    Please also have your blood pressure rechecked by primary care in 1-2 weeks as it was elevated in the Er today.  Vitals:   01/12/18 1344  BP: (!) 131/100  Pulse: 92  Resp: 18  Temp: 98.7 F (37.1 C)  SpO2: 100%

## 2018-01-12 NOTE — ED Triage Notes (Signed)
Pt c/o left ear pain for couple days.

## 2018-01-12 NOTE — ED Provider Notes (Signed)
[facility] DEPT Provider Note   CSN: [id number] Arrival date & time: 01/12/18  1335     History   Chief Complaint Chief Complaint  Patient presents with  . Otalgia    HPI Katrina Stewart is a 22 y.o. female with a hx of anxiety/depression who presents to the ED the ED with URI sxs and chief complaint of otalgia x 2-3 days. Patient reports L ear pain, congestion, sore throat, and cough. States L ear pain is most bothersome, pain is an 8/10 in severity, tried ibuprofen without relief. Otherwise no specific alleviating/aggravating factors. Denies fever, chills, chest pain, dyspnea, ear drainage, or hearing loss.   HPI  Past Medical History:  Diagnosis Date  . Anxiety   . Depression     Patient Active Problem List   Diagnosis Date Noted  . Rubella non-immune status, antepartum 09/03/2015  . Severe episode of recurrent major depressive disorder, without psychotic features ([place])   . Alcohol use disorder     Past Surgical History:  Procedure Laterality Date  . BREAST SURGERY    . MASS EXCISION  10/29/2011   Procedure: EXCISION MASS;  Surgeon: Harl Bowie, MD;  Location: [facility];  Service: General;  Laterality: Bilateral;  excision of bilateral breast masses     OB History    Gravida  2   Para  1   Term  1   Preterm      AB  1   Living  1     SAB  1   TAB  0   Ectopic      Multiple  0   Live Births  1            Home Medications    Prior to Admission medications   Medication Sig Start Date End Date Taking? Authorizing Provider  acetaminophen (TYLENOL) 500 MG tablet Take 1,000 mg by mouth every 6 (six) hours as needed for moderate pain.    [provider]  ferrous sulfate (FERROUSUL) 325 (65 FE) MG tablet Take 1 tablet (325 mg total) by mouth daily with breakfast. 12/10/15   Chancy Milroy, MD  ibuprofen (ADVIL,MOTRIN) 600 MG tablet Take 1 tablet (600 mg total) by mouth every 6 (six) hours. Patient  taking differently: Take 1,200 mg by mouth every 6 (six) hours as needed for mild pain.  01/07/17   Luvenia Redden, PA-C  medroxyPROGESTERone (PROVERA) 10 MG tablet Take 2 tablets (20 mg total) by mouth 3 (three) times daily. 03/29/17   Starr Lake, CNM  sertraline (ZOLOFT) 50 MG tablet Take 50 mg by mouth daily.    [provider]  Vitamin D, Ergocalciferol, (DRISDOL) 50000 units CAPS capsule Take 50,000 Units by mouth every 7 (seven) days. Wednesday    [provider]    Family History Family History  Problem Relation Age of Onset  . Cancer Maternal Grandmother        lung cancer  . Cancer Other        bladder cancer    Social History Social History   Tobacco Use  . Smoking status: Never Smoker  . Smokeless tobacco: Never Used  Substance Use Topics  . Alcohol use: No    Alcohol/week: 2.0 - 3.0 standard drinks    Types: 2 - 3 Shots of liquor per week    Frequency: Never  . Drug use: No     Allergies   Patient has no known allergies.   Review  of Systems Review of Systems  Constitutional: Negative for chills and fever.  HENT: Positive for congestion, ear pain, rhinorrhea and sore throat. Negative for ear discharge, hearing loss and trouble swallowing.   Respiratory: Positive for cough. Negative for shortness of breath.   Cardiovascular: Negative for chest pain.  Gastrointestinal: Negative for abdominal pain, diarrhea and vomiting.     Physical Exam Updated Vital Signs BP (!) 131/100 (BP Location: Left Arm)   Pulse 92   Temp 98.7 F (37.1 C) (Oral)   Resp 18   LMP 12/27/2017   SpO2 100%   Physical Exam Vitals signs and nursing note reviewed.  Constitutional:      General: She is not in acute distress.    Appearance: She is well-developed. She is not toxic-appearing.  HENT:     Head: Normocephalic and atraumatic.     Right Ear: Ear canal and external ear normal. Tympanic membrane is not perforated, erythematous, retracted or  bulging.     Left Ear: Ear canal and external ear normal. Tympanic membrane is erythematous (with some fullness, no complete loss of landmarks). Tympanic membrane is not perforated or retracted.     Ears:     Comments: No mastoid erythema/swelling/tenderness bilaterally.     Nose: Mucosal edema present.     Mouth/Throat:     Pharynx: Uvula midline. Posterior oropharyngeal erythema (mild) present. No pharyngeal swelling or oropharyngeal exudate.     Comments: Posterior oropharynx is symmetric appearing. Patient tolerating own secretions without difficulty. No trismus. No drooling. No hot potato voice. No swelling beneath the tongue, submandibular compartment is soft.   Eyes:     General:        Right eye: No discharge.        Left eye: No discharge.     Conjunctiva/sclera: Conjunctivae normal.     Pupils: Pupils are equal, round, and reactive to light.  Neck:     Musculoskeletal: Normal range of motion and neck supple. No edema or neck rigidity.  Cardiovascular:     Rate and Rhythm: Normal rate and regular rhythm.     Heart sounds: No murmur.  Pulmonary:     Effort: No respiratory distress.     Breath sounds: Normal breath sounds. No wheezing, rhonchi or rales.     Comments: Respirations even and unlabored  Abdominal:     General: There is no distension.     Palpations: Abdomen is soft.     Tenderness: There is no abdominal tenderness.  Lymphadenopathy:     Cervical: No cervical adenopathy.  Skin:    General: Skin is warm and dry.     Findings: No rash.  Neurological:     Mental Status: She is alert.  Psychiatric:        Behavior: Behavior normal.      ED Treatments / Results  Labs (all labs ordered are listed, but only abnormal results are displayed) Labs Reviewed - No data to display  EKG None  Radiology No results found.  Procedures Procedures (including critical care time)  Medications Ordered in ED Medications - No data to display   Initial Impression /  Assessment and Plan / ED Course  I have reviewed the triage vital signs and the nursing notes.  Pertinent labs & imaging results that were available during my care of the patient were reviewed by me and considered in my medical decision making (see chart for details).    Patient presents with URI type symptoms, primary complaint is  L ear pain.  Patient is nontoxic appearing, in no apparent distress, vitals are WNL other than elevated BP- doubt HTN emergency- PCP recheck. Patient is afebrile in the ED, lungs are CTA, doubt pneumonia. There is no wheezing or signs of respiratory distress. Sxs onset < 7 days, afebrile, no sinus tenderness, doubt acute bacterial sinusitis. Centor score 0, doubt strep pharyngitis. L TM is erythematous and full, no completely loss of landmarks, somewhat suspicion for AOM, no evidence of mastoiditis or meningismus. Will initially treat supportively with Naproxen, Flonase, and Tessalon, we will provide Amoxicillin prescription w/ watch & wait method for 48 hours for possible AOM- patient agreeable. I discussed results, treatment plan, need for PCP follow-up, and return precautions with the patient. Provided opportunity for questions, patient confirmed understanding and is in agreement with plan.    Final Clinical Impressions(s) / ED Diagnoses   Final diagnoses:  Left ear pain  URI with cough and congestion    ED Discharge Orders         Ordered    fluticasone (FLONASE) 50 MCG/ACT nasal spray  Daily     01/12/18 1423    benzonatate (TESSALON) 100 MG capsule  Every 8 hours     01/12/18 1423    naproxen (NAPROSYN) 500 MG tablet  2 times daily     01/12/18 1423    amoxicillin (AMOXIL) 500 MG capsule  3 times daily     01/12/18 [address], [place], PA-C 01/12/18 1425    Nat Christen, MD 01/16/18 1152

## 2018-06-13 ENCOUNTER — Other Ambulatory Visit: Payer: Self-pay

## 2018-06-13 ENCOUNTER — Encounter (HOSPITAL_COMMUNITY): Payer: Self-pay

## 2018-06-13 ENCOUNTER — Emergency Department (HOSPITAL_COMMUNITY)
Admission: EM | Admit: 2018-06-13 | Discharge: 2018-06-13 | Disposition: A | Discharge status: Home / Self Care | Payer: Medicaid Other | Attending: Emergency Medicine | Admitting: Emergency Medicine

## 2018-06-13 DIAGNOSIS — N760 Acute vaginitis: Secondary | ICD-10-CM | POA: Insufficient documentation

## 2018-06-13 DIAGNOSIS — Z79899 Other long term (current) drug therapy: Secondary | ICD-10-CM | POA: Diagnosis not present

## 2018-06-13 DIAGNOSIS — N739 Female pelvic inflammatory disease, unspecified: Secondary | ICD-10-CM | POA: Diagnosis not present

## 2018-06-13 DIAGNOSIS — M5441 Lumbago with sciatica, right side: Secondary | ICD-10-CM | POA: Diagnosis not present

## 2018-06-13 DIAGNOSIS — B9689 Other specified bacterial agents as the cause of diseases classified elsewhere: Secondary | ICD-10-CM

## 2018-06-13 DIAGNOSIS — N898 Other specified noninflammatory disorders of vagina: Secondary | ICD-10-CM | POA: Diagnosis present

## 2018-06-13 LAB — COMPREHENSIVE METABOLIC PANEL
ALT: 14 U/L (ref 0–44)
AST: 13 U/L — ABNORMAL LOW (ref 15–41)
Albumin: 4.4 g/dL (ref 3.5–5.0)
Alkaline Phosphatase: 60 U/L (ref 38–126)
Anion gap: 8 (ref 5–15)
BUN: 16 mg/dL (ref 6–20)
CO2: 21 mmol/L — ABNORMAL LOW (ref 22–32)
Calcium: 9.2 mg/dL (ref 8.9–10.3)
Chloride: 107 mmol/L (ref 98–111)
Creatinine, Ser: 0.89 mg/dL (ref 0.44–1.00)
GFR calc Af Amer: >60 mL/min (ref >60)
GFR calc non Af Amer: >60 mL/min (ref >60)
Glucose, Bld: 94 mg/dL (ref 70–99)
Potassium: 3.5 mmol/L (ref 3.5–5.1)
Sodium: 136 mmol/L (ref 135–145)
Total Bilirubin: 0.6 mg/dL (ref 0.3–1.2)
Total Protein: 8.2 g/dL — ABNORMAL HIGH (ref 6.5–8.1)

## 2018-06-13 LAB — URINALYSIS, ROUTINE W REFLEX MICROSCOPIC
Bacteria, UA: NONE SEEN
Bilirubin Urine: NEGATIVE
Glucose, UA: NEGATIVE mg/dL
Ketones, ur: NEGATIVE mg/dL
Leukocytes,Ua: NEGATIVE
Nitrite: NEGATIVE
Protein, ur: NEGATIVE mg/dL
Specific Gravity, Urine: 1.016 (ref 1.005–1.030)
pH: 5 (ref 5.0–8.0)

## 2018-06-13 LAB — WET PREP, GENITAL
Sperm: NONE SEEN
Trich, Wet Prep: NONE SEEN
Yeast Wet Prep HPF POC: NONE SEEN

## 2018-06-13 LAB — CBC
HCT: 42.3 % (ref 36.0–46.0)
Hemoglobin: 12.8 g/dL (ref 12.0–15.0)
MCH: 22.5 pg — ABNORMAL LOW (ref 26.0–34.0)
MCHC: 30.3 g/dL (ref 30.0–36.0)
MCV: 74.2 fL — ABNORMAL LOW (ref 80.0–100.0)
Platelets: 332 10*3/uL (ref 150–400)
RBC: 5.7 MIL/uL — ABNORMAL HIGH (ref 3.87–5.11)
RDW: 16.9 % — ABNORMAL HIGH (ref 11.5–15.5)
WBC: 7.6 10*3/uL (ref 4.0–10.5)
nRBC: 0 % (ref 0.0–0.2)

## 2018-06-13 LAB — I-STAT BETA HCG BLOOD, ED (MC, WL, AP ONLY): I-stat hCG, quantitative: <5 m[IU]/mL (ref <5)

## 2018-06-13 LAB — LIPASE, BLOOD: Lipase: 43 U/L (ref 11–51)

## 2018-06-13 MED ORDER — DOXYCYCLINE HYCLATE 100 MG PO CAPS: 100.0000 mg | ORAL_CAPSULE | Freq: Two times a day (BID) | ORAL | 0 refills | Status: AC

## 2018-06-13 MED ORDER — CEFTRIAXONE SODIUM 1 G IJ SOLR
1.0000 g | Freq: Once | INTRAMUSCULAR | Status: AC
  Administered 2018-06-13: 1 g via INTRAMUSCULAR
  Filled 2018-06-13: qty 10

## 2018-06-13 MED ORDER — KETOROLAC TROMETHAMINE 30 MG/ML IJ SOLN
30.0000 mg | Freq: Once | INTRAMUSCULAR | Status: AC
  Administered 2018-06-13: 30 mg via INTRAVENOUS
  Filled 2018-06-13: qty 1

## 2018-06-13 MED ORDER — METRONIDAZOLE 500 MG PO TABS: 500.0000 mg | ORAL_TABLET | Freq: Two times a day (BID) | ORAL | 0 refills | Status: DC

## 2018-06-13 MED ORDER — METHOCARBAMOL 500 MG PO TABS: 500.0000 mg | ORAL_TABLET | Freq: Two times a day (BID) | ORAL | 0 refills | Status: AC

## 2018-06-13 MED ORDER — SODIUM CHLORIDE 0.9% FLUSH: 3.0000 mL | Freq: Once | INTRAVENOUS | Status: DC

## 2018-06-13 NOTE — TOC Initial Note (Signed)
Transition of Care [facility]) - Initial/Assessment Note    Patient Details  Name: Katrina Stewart MRN: [id number] Date of Birth: 1995-10-30  [facility] CM/SW Contact:    Erenest Rasher, RN Phone Number: 06/13/2018, 2:46 PM  Clinical Narrative:   2 ED visits within 6 months-no insurance              Spoke to pt and states she had Medicaid but at her last PCP it was no longer active. Discussed contacting her DSS CW to reinstate Medicaid. Pt has small child in the home. Provided pt with brochure on [facility], [facility] and Counseling. [facility] Health Dept can continue to assist with testing and counseling.   Expected Discharge Plan: Home/Self Care Barriers to Discharge: No Barriers Identified   Patient Goals and CMS Choice        Expected Discharge Plan and Services Expected Discharge Plan: Home/Self Care   Discharge Planning Services: CM Consult                     DME Arranged: N/A DME Agency: NA                  Prior Living Arrangements/Services   Lives with:: Minor Children Patient language and need for interpreter reviewed:: Yes Do you feel safe going back to the place where you live?: Yes      Need for Family Participation in Patient Care: No (Comment) Care giver support system in place?: No (comment)   Criminal Activity/Legal Involvement Pertinent to Current Situation/Hospitalization: No - Comment as needed  Activities of Daily Living      Permission Sought/Granted Permission sought to share information with : Case Manager, PCP                Emotional Assessment Appearance:: Appears stated age Attitude/Demeanor/Rapport: Engaged Affect (typically observed): Accepting Orientation: : Oriented to Self, Oriented to Place, Oriented to  Time, Oriented to Situation      Admission diagnosis:  vaginal pain / back pain  Patient Active Problem List   Diagnosis Date Noted  . Rubella non-immune status, antepartum  09/03/2015  . Severe episode of recurrent major depressive disorder, without psychotic features ([place])   . Alcohol use disorder    PCP:  [facility] [place]:   [facility] [place] [place] - [address] [place] ([place]) [place] [address] Phone: [phone] Fax: [phone]     Social Determinants of Health (SDOH) Interventions    Readmission Risk Interventions No flowsheet data found.

## 2018-06-13 NOTE — Discharge Instructions (Signed)
The gonorrhea/chlamydia test results with take 2-3 days to return. You can also log on MyChart to see the results.   You have been treated today for pelvic inflammatory disease, which can result from STDs. You have been treated for STDs. Continue taking antibiotics.   Take Flagyl as directed.  It is very important that you do not consume any alcohol while taking this medication as it will cause you to become violently ill.  If your results are positive, your sexual partner needs to be treated too. Do not have sexual intercourse for the next 7 days and after your partner has been treated.   Follow-up with your primary care doctor in 2-4 days. If you do not have a primary care doctor, you can use one listed in the paperwork.   Return to the Emergency Department for any fever, abdominal pain, difficulty breathing, nausea/vomiting or any other worsening or concerning symptoms.   Return to the Emergency Department immediately for any worsening back pain, neck pain, difficulty walking, numbness/weaknss of your arms or legs, urinary or bowel accidents, fever or any other worsening or concerning symptoms.

## 2018-06-13 NOTE — ED Notes (Signed)
ED Provider at bedside. 

## 2018-06-13 NOTE — ED Provider Notes (Signed)
[facility] DEPT Provider Note   CSN: [id number] Arrival date & time: 06/13/18  1140    History   Chief Complaint Chief Complaint  Patient presents with  . Abdominal Pain  . Back Pain  . Vaginal Discharge  . Dysuria    HPI Katrina Stewart is a 23 y.o. female asthma history of anxiety, depression who presents for evaluation of lower abdominal pain x5 days.  She describes it as an intermittent cramping type pain.  She denies any alleviating or aggravating factors.  She has had some nausea but denies any vomiting.  She also reports she has had some dysuria.  She has not noticed any hematuria.  Patient reports she has had some white vaginal discharge.  She is currently sexually active with one partner.  They do not use protection.  She has not noted any vaginal bleeding.  Her LMP was 05/25/18.  Last bowel movement was earlier today.  No blood noted in stools.  Patient states she is also had some bilateral back pain on the lower back.  She states the pain is worse with movement.  She states occasionally, the pain will radiate down the posterior aspect of her right lower extremity along with a tingling sensation.  Denies any weakness.  Denies any preceding trauma, injury, fall.  She does not take any medication for the pain. Denies fevers, weight loss, numbness/weakness of upper and lower extremities, bowel/bladder incontinence, saddle anesthesia, history of back surgery, history of IVDA.  Patient denies any fevers, CP.      The history is provided by the patient.    Past Medical History:  Diagnosis Date  . Anxiety   . Depression     Patient Active Problem List   Diagnosis Date Noted  . Rubella non-immune status, antepartum 09/03/2015  . Severe episode of recurrent major depressive disorder, without psychotic features ([place])   . Alcohol use disorder     Past Surgical History:  Procedure Laterality Date  . BREAST SURGERY    . MASS EXCISION  10/29/2011   Procedure: EXCISION MASS;  Surgeon: Harl Bowie, MD;  Location: [facility];  Service: General;  Laterality: Bilateral;  excision of bilateral breast masses     OB History    Gravida  2   Para  1   Term  1   Preterm      AB  1   Living  1     SAB  1   TAB  0   Ectopic      Multiple  0   Live Births  1            Home Medications    Prior to Admission medications   Medication Sig Start Date End Date Taking? Authorizing Provider  acetaminophen (TYLENOL) 500 MG tablet Take 1,000 mg by mouth every 6 (six) hours as needed for moderate pain.   Yes [provider]  amoxicillin (AMOXIL) 500 MG capsule Take 1 capsule (500 mg total) by mouth 3 (three) times daily. Patient not taking: Reported on 06/13/2018 01/12/18   Petrucelli, Glynda Jaeger, PA-C  benzonatate (TESSALON) 100 MG capsule Take 1 capsule (100 mg total) by mouth every 8 (eight) hours. Patient not taking: Reported on 06/13/2018 01/12/18   Petrucelli, Glynda Jaeger, PA-C  doxycycline (VIBRAMYCIN) 100 MG capsule Take 1 capsule (100 mg total) by mouth 2 (two) times daily for 7 days. 06/13/18 06/20/18  Volanda Napoleon, PA-C  ferrous sulfate (FERROUSUL) 325 (65 FE)  MG tablet Take 1 tablet (325 mg total) by mouth daily with breakfast. Patient not taking: Reported on 06/13/2018 12/10/15   Chancy Milroy, MD  fluticasone [facility]) 50 MCG/ACT nasal spray Place 1 spray into both nostrils daily. Patient not taking: Reported on 06/13/2018 01/12/18   Petrucelli, Glynda Jaeger, PA-C  ibuprofen (ADVIL,MOTRIN) 600 MG tablet Take 1 tablet (600 mg total) by mouth every 6 (six) hours. Patient not taking: Reported on 06/13/2018 01/07/17   Luvenia Redden, PA-C  medroxyPROGESTERone (PROVERA) 10 MG tablet Take 2 tablets (20 mg total) by mouth 3 (three) times daily. Patient not taking: Reported on 06/13/2018 03/29/17   Starr Lake, CNM  methocarbamol (ROBAXIN) 500 MG tablet Take 1 tablet (500 mg total) by mouth 2 (two) times  daily for 7 days. 06/13/18 06/20/18  Volanda Napoleon, PA-C  metroNIDAZOLE (FLAGYL) 500 MG tablet Take 1 tablet (500 mg total) by mouth 2 (two) times daily. 06/13/18   Volanda Napoleon, PA-C  naproxen (NAPROSYN) 500 MG tablet Take 1 tablet (500 mg total) by mouth 2 (two) times daily. Patient not taking: Reported on 06/13/2018 01/12/18   Petrucelli, Glynda Jaeger, PA-C    Family History Family History  Problem Relation Age of Onset  . Cancer Maternal Grandmother        lung cancer  . Cancer Other        bladder cancer    Social History Social History   Tobacco Use  . Smoking status: Never Smoker  . Smokeless tobacco: Never Used  Substance Use Topics  . Alcohol use: No    Alcohol/week: 2.0 - 3.0 standard drinks    Types: 2 - 3 Shots of liquor per week    Frequency: Never  . Drug use: No     Allergies   Patient has no known allergies.   Review of Systems Review of Systems  Constitutional: Negative for fever.  Respiratory: Negative for cough and shortness of breath.   Cardiovascular: Negative for chest pain.  Gastrointestinal: Positive for abdominal pain. Negative for diarrhea, nausea and vomiting.  Genitourinary: Positive for dysuria and vaginal discharge. Negative for hematuria and vaginal bleeding.  Musculoskeletal: Positive for back pain.  Neurological: Negative for weakness, numbness and headaches.  All other systems reviewed and are negative.    Physical Exam Updated Vital Signs BP 132/88   Pulse 81   Temp 98.5 F (36.9 C) (Oral)   Resp 17   Ht 5\' 9"  (1.753 m)   Wt 122.5 kg   LMP 05/25/2018   SpO2 100%   BMI 39.87 kg/m   Physical Exam Vitals signs and nursing note reviewed. Exam conducted with a chaperone present.  Constitutional:      Appearance: Normal appearance. She is well-developed.     Comments: Sitting comfortably on examination table  HENT:     Head: Normocephalic and atraumatic.  Eyes:     General: Lids are normal.     Conjunctiva/sclera:  Conjunctivae normal.     Pupils: Pupils are equal, round, and reactive to light.  Neck:     Musculoskeletal: Full passive range of motion without pain.  Cardiovascular:     Rate and Rhythm: Normal rate and regular rhythm.     Pulses: Normal pulses.          Radial pulses are 2+ on the right side and 2+ on the left side.       Dorsalis pedis pulses are 2+ on the right side and 2+ on the  left side.     Heart sounds: Normal heart sounds. No murmur. No friction rub. No gallop.   Pulmonary:     Effort: Pulmonary effort is normal.     Breath sounds: Normal breath sounds.     Comments: Lungs clear to auscultation bilaterally.  Symmetric chest rise.  No wheezing, rales, rhonchi. Abdominal:     Palpations: Abdomen is soft. Abdomen is not rigid.     Tenderness: There is abdominal tenderness in the suprapubic area. There is right CVA tenderness and left CVA tenderness. There is no guarding.     Comments: Abdomen is soft, nondistended.  Tenderness in the suprapubic region.  No rigidity, guarding. Bilateral CVA tenderness.   Genitourinary:    Vagina: Normal.     Cervix: Cervical motion tenderness and discharge present.     Adnexa:        Right: Tenderness present. No mass.         Left: Tenderness present. No mass.       Comments: The exam was performed with a chaperone present. Normal external female genitalia. No lesions, rash, or sores. Musculoskeletal: Normal range of motion.  Skin:    General: Skin is warm and dry.     Capillary Refill: Capillary refill takes less than 2 seconds.  Neurological:     Mental Status: She is alert and oriented to person, place, and time.     Comments: Cranial nerves III-XII intact Follows commands, Moves all extremities  5/5 strength to BUE and BLE  Sensation intact throughout all major nerve distributions Positive SLR on right No gait abnormalities  No slurred speech. No facial droop.   Psychiatric:        Speech: Speech normal.      ED Treatments /  Results  Labs (all labs ordered are listed, but only abnormal results are displayed) Labs Reviewed  WET PREP, GENITAL - Abnormal; Notable for the following components:      Result Value   Clue Cells Wet Prep HPF POC PRESENT (*)    WBC, Wet Prep HPF POC MANY (*)    All other components within normal limits  COMPREHENSIVE METABOLIC PANEL - Abnormal; Notable for the following components:   CO2 21 (*)    Total Protein 8.2 (*)    AST 13 (*)    All other components within normal limits  CBC - Abnormal; Notable for the following components:   RBC 5.70 (*)    MCV 74.2 (*)    MCH 22.5 (*)    RDW 16.9 (*)    All other components within normal limits  URINALYSIS, ROUTINE W REFLEX MICROSCOPIC - Abnormal; Notable for the following components:   Color, Urine STRAW (*)    Hgb urine dipstick MODERATE (*)    All other components within normal limits  LIPASE, BLOOD  I-STAT BETA HCG BLOOD, ED ([facility], [facility], [facility] ONLY)  GC/CHLAMYDIA PROBE AMP ([place]) NOT AT [facility]    EKG None  Radiology No results found.  Procedures Procedures (including critical care time)  Medications Ordered in ED Medications  sodium chloride flush (NS) 0.9 % injection 3 mL (3 mLs Intravenous Not Given 06/13/18 1232)  ketorolac (TORADOL) 30 MG/ML injection 30 mg (30 mg Intravenous Given 06/13/18 1310)  cefTRIAXone (ROCEPHIN) injection 1 g (1 g Intramuscular Given 06/13/18 1435)     Initial Impression / Assessment and Plan / ED Course  I have reviewed the triage vital signs and the nursing notes.  Pertinent labs & imaging  results that were available during my care of the patient were reviewed by me and considered in my medical decision making (see chart for details).        23 year old female who presents for evaluation of abdominal pain x5 days.  As well as dysuria, vaginal discharge.  She reports she also has had some lower back pain.  No fevers, vomiting. Patient is afebrile, non-toxic appearing, sitting comfortably  on examination table. Vital signs reviewed and stable. On exam, she has some suprapubic abdominal tenderness.  Concern for infectious process versus PID versus STD versus UTI versus pyelonephritis.  Will plan to check labs, pelvic./Physical exam is not concerning for appendicitis, tubo-ovarian abscess, ovarian torsion.  Additionally, I suspect her back pain is related to musculoskeletal back pain with sciatica.  She reports pain rating down the posterior aspect of her right lower extremity as well as some lower back pain.  No history of trauma.  No indication for imaging.  History/physical exam not concerning for cauda equina, spinal abscess.  I-STAT beta negative.  Lipase unremarkable.  CMP shows no acute abnormalities.  CBC shows no evidence of leukocytosis or anemia.  UA shows moderate hemoglobin.  Wet prep shows positive clue cells.  No evidence of trichomoniasis or yeast.  Pelvic exam as documented.  She did have some CMT and bilateral adnexal tenderness.  Additionally, she did have some cervical discharge.  Concern for STD/PID.  We will plan to treat accordingly.  Reevaluation after analgesics.  Patient reports improvement in pain both abdominal and back pain.  Repeat abdominal exam improved.  Vital signs are stable.  Discussed with patient regarding treatment options for PID.  Patient with no known drug allergies.  At this time, her exam is not concerning for surgical intra-abdominal process.  Indication for further imaging via CT or ultrasound.  Encouraged safe sex practices.  Additionally, I suspect her back pain is most likely related to musculoskeletal pain with sciatica.  Encouraged on further care measures.  Additionally, we will give her a short course of Robaxin for pain relief. At this time, patient exhibits no emergent life-threatening condition that require further evaluation in ED or admission. Patient had ample opportunity for questions and discussion. All patient's questions were answered  with full understanding. Strict return precautions discussed. Patient expresses understanding and agreement to plan.   Portions of this note were generated with [employer]. Dictation errors may occur despite best attempts at proofreading.  Final Clinical Impressions(s) / ED Diagnoses   Final diagnoses:  PID (pelvic inflammatory disease)  BV (bacterial vaginosis)  Acute bilateral low back pain with right-sided sciatica    ED Discharge Orders         Ordered    methocarbamol (ROBAXIN) 500 MG tablet  2 times daily     06/13/18 1422    metroNIDAZOLE (FLAGYL) 500 MG tablet  2 times daily     06/13/18 1422    doxycycline (VIBRAMYCIN) 100 MG capsule  2 times daily     06/13/18 1422           Desma Mcgregor 06/13/18 [place], Kevin, MD 06/16/18 1851

## 2018-06-13 NOTE — ED Triage Notes (Signed)
Patient c/o white vaginal discharge, lower abdominal pain, bilateral lower back pain, and dysuria x 1 week.

## 2018-06-14 LAB — GC/CHLAMYDIA PROBE AMP (~~LOC~~) NOT AT ARMC
Chlamydia: NEGATIVE
Neisseria Gonorrhea: NEGATIVE

## 2018-07-28 ENCOUNTER — Encounter (HOSPITAL_COMMUNITY): Payer: Self-pay | Admitting: *Deleted

## 2018-07-28 ENCOUNTER — Inpatient Hospital Stay (HOSPITAL_COMMUNITY): Payer: Medicaid Other

## 2018-07-28 ENCOUNTER — Other Ambulatory Visit: Payer: Self-pay

## 2018-07-28 ENCOUNTER — Inpatient Hospital Stay (HOSPITAL_COMMUNITY)
Admission: AD | Admit: 2018-07-28 | Discharge: 2018-07-28 | Disposition: A | Discharge status: Home / Self Care | Payer: Medicaid Other | Attending: Obstetrics and Gynecology | Admitting: Obstetrics and Gynecology

## 2018-07-28 DIAGNOSIS — Z3A01 Less than 8 weeks gestation of pregnancy: Secondary | ICD-10-CM | POA: Insufficient documentation

## 2018-07-28 DIAGNOSIS — O3680X0 Pregnancy with inconclusive fetal viability, not applicable or unspecified: Secondary | ICD-10-CM

## 2018-07-28 DIAGNOSIS — O26891 Other specified pregnancy related conditions, first trimester: Secondary | ICD-10-CM | POA: Insufficient documentation

## 2018-07-28 DIAGNOSIS — R109 Unspecified abdominal pain: Secondary | ICD-10-CM | POA: Diagnosis not present

## 2018-07-28 LAB — URINALYSIS, ROUTINE W REFLEX MICROSCOPIC
Bilirubin Urine: NEGATIVE
Glucose, UA: NEGATIVE mg/dL
Ketones, ur: NEGATIVE mg/dL
Leukocytes,Ua: NEGATIVE
Nitrite: NEGATIVE
Protein, ur: NEGATIVE mg/dL
Specific Gravity, Urine: 1.025 (ref 1.005–1.030)
pH: 5 (ref 5.0–8.0)

## 2018-07-28 LAB — ABO/RH
ABO/RH(D): B NEG
Antibody Screen: NEGATIVE

## 2018-07-28 LAB — WET PREP, GENITAL
Clue Cells Wet Prep HPF POC: NONE SEEN
Sperm: NONE SEEN
Trich, Wet Prep: NONE SEEN
Yeast Wet Prep HPF POC: NONE SEEN

## 2018-07-28 LAB — CBC
HCT: 39.7 % (ref 36.0–46.0)
Hemoglobin: 12.2 g/dL (ref 12.0–15.0)
MCH: 21.9 pg — ABNORMAL LOW (ref 26.0–34.0)
MCHC: 30.7 g/dL (ref 30.0–36.0)
MCV: 71.4 fL — ABNORMAL LOW (ref 80.0–100.0)
Platelets: 353 10*3/uL (ref 150–400)
RBC: 5.56 MIL/uL — ABNORMAL HIGH (ref 3.87–5.11)
RDW: 16 % — ABNORMAL HIGH (ref 11.5–15.5)
WBC: 6.8 10*3/uL (ref 4.0–10.5)
nRBC: 0 % (ref 0.0–0.2)

## 2018-07-28 LAB — HCG, QUANTITATIVE, PREGNANCY: hCG, Beta Chain, Quant, S: 434 m[IU]/mL — ABNORMAL HIGH (ref <5)

## 2018-07-28 NOTE — MAU Provider Note (Addendum)
Chief Complaint: Abdominal Pain and Back Pain   First Provider Initiated Contact with Patient 07/28/18 1200     SUBJECTIVE HPI: Katrina Stewart is a 23 y.o. G3P1011 at [redacted]w[redacted]d who presents to [facility] Admissions reporting abdominal pain & back pain. Symptoms started last night. Reports constant low back pain for the last few days. Intermittent lower abdominal cramping. Denies n/v/d, constipation, dysuria, vaginal bleeding, vaginal discharge, or fever/chills.   Location: abdomen & back Quality: cramping, aching Severity: 3/10 on pain scale Duration: 3 dasy Timing: constant & intermittent Modifying factors: nothing makes symptoms worse. Hasn't treated symptoms Associated signs and symptoms: none  Past Medical History:  Diagnosis Date  . Anxiety   . Depression    OB History  Gravida Para Term Preterm AB Living  3 1 1   1 1   SAB TAB Ectopic Multiple Live Births  1 0   0 1    # Outcome Date GA Lbr Len/2nd Weight Sex Delivery Anes PTL Lv  3 Current           2 Term 02/22/16 [redacted]w[redacted]d 24:20 / 00:26 3440 g M Vag-Spont EPI  LIV  1 SAB            Past Surgical History:  Procedure Laterality Date  . BREAST SURGERY    . MASS EXCISION  10/29/2011   Procedure: EXCISION MASS;  Surgeon: Harl Bowie, MD;  Location: [facility];  Service: General;  Laterality: Bilateral;  excision of bilateral breast masses   Social History   Socioeconomic History  . Marital status: Single    Spouse name: Not on file  . Number of children: Not on file  . Years of education: Not on file  . Highest education level: Not on file  Occupational History  . Not on file  Social Needs  . Financial resource strain: Not on file  . Food insecurity    Worry: Not on file    Inability: Not on file  . Transportation needs    Medical: Not on file    Non-medical: Not on file  Tobacco Use  . Smoking status: Never Smoker  . Smokeless tobacco: Never Used  Substance and Sexual Activity  . Alcohol use: No     Alcohol/week: 2.0 - 3.0 standard drinks    Types: 2 - 3 Shots of liquor per week    Frequency: Never  . Drug use: No  . Sexual activity: Yes    Birth control/protection: None  Lifestyle  . Physical activity    Days per week: Not on file    Minutes per session: Not on file  . Stress: Not on file  Relationships  . Social Herbalist on phone: Not on file    Gets together: Not on file    Attends religious service: Not on file    Active member of club or organization: Not on file    Attends meetings of clubs or organizations: Not on file    Relationship status: Not on file  . Intimate partner violence    Fear of current or ex partner: Not on file    Emotionally abused: Not on file    Physically abused: Not on file    Forced sexual activity: Not on file  Other Topics Concern  . Not on file  Social History Narrative  . Not on file   Family History  Problem Relation Age of Onset  . Cancer Maternal Grandmother  lung cancer  . Cancer Other        bladder cancer   No current facility-administered medications on file prior to encounter.    Current Outpatient Medications on File Prior to Encounter  Medication Sig Dispense Refill  . acetaminophen (TYLENOL) 500 MG tablet Take 1,000 mg by mouth every 6 (six) hours as needed for moderate pain.     No Known Allergies  I have reviewed patient's Past Medical Hx, Surgical Hx, Family Hx, Social Hx, medications and allergies.   Review of Systems  Constitutional: Negative.   Gastrointestinal: Positive for abdominal pain. Negative for constipation, diarrhea, nausea and vomiting.  Genitourinary: Negative.   Musculoskeletal: Positive for back pain.    OBJECTIVE Patient Vitals for the past 24 hrs:  BP Temp Temp src Pulse Resp SpO2 Height Weight  07/28/18 1408 (!) 145/88 - - (!) 107 16 - - -  07/28/18 1154 (!) 155/89 - - - - - - -  07/28/18 1117 (!) 143/86 98.7 F (37.1 C) Oral (!) 116 18 100 % 5\' 8"  (1.727 m) 126.1 kg    Constitutional: Well-developed, well-nourished female in no acute distress.  Cardiovascular: normal rate & rhythm, no murmur Respiratory: normal rate and effort. Lung sounds clear throughout GI: Abd soft, non-tender, Pos BS x 4. No guarding or rebound tenderness. No CVAT MS: Extremities nontender, no edema, normal ROM Neurologic: Alert and oriented x 4.    LAB RESULTS Results for orders placed or performed during the hospital encounter of 07/28/18 (from the past 24 hour(s))  Urinalysis, Routine w reflex microscopic     Status: Abnormal   Collection Time: 07/28/18 11:28 AM  Result Value Ref Range   Color, Urine YELLOW YELLOW   APPearance CLOUDY (A) CLEAR   Specific Gravity, Urine 1.025 1.005 - 1.030   pH 5.0 5.0 - 8.0   Glucose, UA NEGATIVE NEGATIVE mg/dL   Hgb urine dipstick MODERATE (A) NEGATIVE   Bilirubin Urine NEGATIVE NEGATIVE   Ketones, ur NEGATIVE NEGATIVE mg/dL   Protein, ur NEGATIVE NEGATIVE mg/dL   Nitrite NEGATIVE NEGATIVE   Leukocytes,Ua NEGATIVE NEGATIVE   RBC / HPF 0-5 0 - 5 RBC/hpf   WBC, UA 0-5 0 - 5 WBC/hpf   Bacteria, UA RARE (A) NONE SEEN   Squamous Epithelial / LPF 11-20 0 - 5   Mucus PRESENT   Wet prep, genital     Status: Abnormal   Collection Time: 07/28/18 12:23 PM   Specimen: Cervical/Vaginal swab  Result Value Ref Range   Yeast Wet Prep HPF POC NONE SEEN NONE SEEN   Trich, Wet Prep NONE SEEN NONE SEEN   Clue Cells Wet Prep HPF POC NONE SEEN NONE SEEN   WBC, Wet Prep HPF POC FEW (A) NONE SEEN   Sperm NONE SEEN   CBC     Status: Abnormal   Collection Time: 07/28/18 12:47 PM  Result Value Ref Range   WBC 6.8 4.0 - 10.5 K/uL   RBC 5.56 (H) 3.87 - 5.11 MIL/uL   Hemoglobin 12.2 12.0 - 15.0 g/dL   HCT 39.7 36.0 - 46.0 %   MCV 71.4 (L) 80.0 - 100.0 fL   MCH 21.9 (L) 26.0 - 34.0 pg   MCHC 30.7 30.0 - 36.0 g/dL   RDW 16.0 (H) 11.5 - 15.5 %   Platelets 353 150 - 400 K/uL   nRBC 0.0 0.0 - 0.2 %  ABO/Rh     Status: None   Collection Time: 07/28/18  12:47 PM  Result Value Ref Range   ABO/RH(D) B NEG    Antibody Screen      NEG Performed at [facility] [address] [place], [place] [address]   hCG, quantitative, pregnancy     Status: Abnormal   Collection Time: 07/28/18 12:47 PM  Result Value Ref Range   hCG, Beta Chain, Quant, S 434 (H) <5 mIU/mL    IMAGING US Ob Less Than 14 Weeks With Ob Transvaginal  Result Date: 07/28/2018 CLINICAL DATA:  Cramping. EXAM: OBSTETRIC <14 WK [place] AND TRANSVAGINAL OB US TECHNIQUE: Both transabdominal and transvaginal ultrasound examinations were performed for complete evaluation of the gestation as well as the maternal uterus, adnexal regions, and pelvic cul-de-sac. Transvaginal technique was performed to assess early pregnancy. COMPARISON:  No recent prior. FINDINGS: Intrauterine gestational sac: None visualized Yolk sac:  No Embryo:  No Cardiac Activity: No Heart Rate:   bpm Subchorionic hemorrhage:  None visualized. Maternal uterus/adnexae: Tiny 1.7 cm right ovarian cyst incidentally noted. This appears to be a corpus luteal cyst. Trace free pelvic fluid. IMPRESSION: 1.  No intrauterine pregnancy identified. 2.  Tiny 1.7 cm right corpus luteal cyst.  Trace free pelvic fluid. Electronically Signed   By: Marcello Moores  Register   On: 07/28/2018 13:22    MAU COURSE Orders Placed This Encounter  Procedures  . Culture, OB Urine  . Wet prep, genital  . US OB LESS THAN 14 WEEKS WITH OB TRANSVAGINAL  . Urinalysis, Routine w reflex microscopic  . CBC  . hCG, quantitative, pregnancy  . ABO/Rh  . Discharge patient   No orders of the defined types were placed in this encounter.   MDM +UPT UA, wet prep, GC/chlamydia, CBC, ABO/Rh, quant hCG, and [place] today to rule out ectopic pregnancy  Ultrasound shows no IUP or adnexal mass.  HCG 434  This abdominal pain could represent a normal pregnancy, spontaneous abortion, or even an ectopic pregnancy which can be life-threatening. Cultures were obtained to  rule out pelvic infection.  Pt will return for f/u HCG on Sunday  ASSESSMENT 1. Pregnancy of unknown anatomic location   2. Abdominal pain during pregnancy in first trimester     PLAN Discharge home in stable condition. SAB vs ectopic precautions GC/CT & urine culture pending Pt to return to MAU on Sunday for repeat HCG  Follow-up Information    [facility] Follow up.   Specialty: Obstetrics and Gynecology Why: Come Sunday for blood work. Or sooner for worsening symptoms.  Contact information: [address] [phone] [place] [phone]         Allergies as of 07/28/2018   No Known Allergies     Medication List    STOP taking these medications   amoxicillin 500 MG capsule Commonly known as: AMOXIL   benzonatate 100 MG capsule Commonly known as: TESSALON   ferrous sulfate 325 (65 FE) MG tablet Commonly known as: FerrouSul   fluticasone 50 MCG/ACT nasal spray Commonly known as: FLONASE   ibuprofen 600 MG tablet Commonly known as: ADVIL   medroxyPROGESTERone 10 MG tablet Commonly known as: Provera   metroNIDAZOLE 500 MG tablet Commonly known as: FLAGYL   naproxen 500 MG tablet Commonly known as: NAPROSYN     TAKE these medications   acetaminophen 500 MG tablet Commonly known as: TYLENOL Take 1,000 mg by mouth every 6 (six) hours as needed for moderate pain.        Jorje Guild, NP 07/28/2018  4:08 PM

## 2018-07-28 NOTE — MAU Note (Signed)
Been having a lot of lower cramping (like period cramps), and pain in her lower back. Took 2 preg test yesterday, both were positive.  Didn't know what she could take or how far along she is.

## 2018-07-28 NOTE — Discharge Instructions (Signed)
Return to care   If you have heavier bleeding that soaks through more that 2 pads per hour for an hour or more  If you bleed so much that you feel like you might pass out or you do pass out  If you have significant abdominal pain that is not improved with Tylenol   If you develop a fever > 100.5      Abdominal Pain During Pregnancy  Abdominal pain is common during pregnancy, and has many possible causes. Some causes are more serious than others, and sometimes the cause is not known. Abdominal pain can be a sign that labor is starting. It can also be caused by normal growth and stretching of muscles and ligaments during pregnancy. Always tell your health care provider if you have any abdominal pain. Follow these instructions at home:  Do not have sex or put anything in your vagina until your pain goes away completely.  Get plenty of rest until your pain improves.  Drink enough fluid to keep your urine pale yellow.  Take over-the-counter and prescription medicines only as told by your health care provider.  Keep all follow-up visits as told by your health care provider. This is important. Contact a health care provider if:  Your pain continues or gets worse after resting.  You have lower abdominal pain that: ? Comes and goes at regular intervals. ? Spreads to your back. ? Is similar to menstrual cramps.  You have pain or burning when you urinate. Get help right away if:  You have a fever or chills.  You have vaginal bleeding.  You are leaking fluid from your vagina.  You are passing tissue from your vagina.  You have vomiting or diarrhea that lasts for more than 24 hours.  Your baby is moving less than usual.  You feel very weak or faint.  You have shortness of breath.  You develop severe pain in your upper abdomen. Summary  Abdominal pain is common during pregnancy, and has many possible causes.  If you experience abdominal pain during pregnancy, tell your  health care provider right away.  Follow your health care provider's home care instructions and keep all follow-up visits as directed. This information is not intended to replace advice given to you by your health care provider. Make sure you discuss any questions you have with your health care provider. Document Released: 01/18/2005 Document Revised: 05/08/2018 Document Reviewed: 04/22/2016 Elsevier Patient Education  2020 [employer].

## 2018-07-29 LAB — CULTURE, OB URINE

## 2018-07-30 ENCOUNTER — Other Ambulatory Visit: Payer: Self-pay

## 2018-07-30 ENCOUNTER — Inpatient Hospital Stay (HOSPITAL_COMMUNITY)
Admission: AD | Admit: 2018-07-30 | Discharge: 2018-07-30 | Disposition: A | Discharge status: Home / Self Care | Payer: Medicaid Other | Attending: Obstetrics and Gynecology | Admitting: Obstetrics and Gynecology

## 2018-07-30 ENCOUNTER — Inpatient Hospital Stay (HOSPITAL_COMMUNITY): Admission: AD | Admit: 2018-07-30 | Payer: Self-pay | Source: Ambulatory Visit | Admitting: Obstetrics and Gynecology

## 2018-07-30 DIAGNOSIS — O26893 Other specified pregnancy related conditions, third trimester: Secondary | ICD-10-CM | POA: Insufficient documentation

## 2018-07-30 DIAGNOSIS — O3680X0 Pregnancy with inconclusive fetal viability, not applicable or unspecified: Secondary | ICD-10-CM | POA: Diagnosis not present

## 2018-07-30 DIAGNOSIS — Z3A01 Less than 8 weeks gestation of pregnancy: Secondary | ICD-10-CM | POA: Diagnosis not present

## 2018-07-30 LAB — HCG, QUANTITATIVE, PREGNANCY: hCG, Beta Chain, Quant, S: 987 m[IU]/mL — ABNORMAL HIGH (ref <5)

## 2018-07-30 NOTE — MAU Provider Note (Signed)
Subjective:  Katrina Stewart is a 23 y.o. G3P1011 at [redacted]w[redacted]d who presents today for FU BHCG. She was seen on 07/28/2018. Results from that day show no IUP on [place], and HCG 434. She denies vaginal bleeding. She denies abdominal or pelvic pain.   Objective:  Physical Exam  Nursing note and vitals reviewed.  Patient Vitals for the past 24 hrs:  BP Temp Temp src Pulse Resp SpO2  07/30/18 1149 136/87 98.9 F (37.2 C) Oral (!) 107 18 100 %    Constitutional: She is oriented to person, place, and time. She appears well-developed and well-nourished. No distress.  HENT:  Head: Normocephalic.  Cardiovascular: Normal rate.  Respiratory: Effort normal.  GI: Soft. There is no tenderness.  Neurological: She is alert and oriented to person, place, and time. Skin: Skin is warm and dry.  Psychiatric: She has a normal mood and affect.   Results for orders placed or performed during the hospital encounter of 07/30/18 (from the past 24 hour(s))  hCG, quantitative, pregnancy     Status: Abnormal   Collection Time: 07/30/18 12:04 PM  Result Value Ref Range   hCG, Beta Chain, Quant, S 987 (H) <5 mIU/mL    Assessment/Plan: Pregnancy of unknown location HCG did  rise appropriately FU in 2 days for: repeat hCG Appt scheduled @[facility]  for 08/01/2018 @1330 , pt aware and agrees with plan Pt discharged to home in stable condition

## 2018-07-30 NOTE — Discharge Instructions (Signed)
Ectopic Pregnancy ° °An ectopic pregnancy is when the fertilized egg attaches (implants) outside the uterus. Most ectopic pregnancies occur in one of the tubes where eggs travel from the ovary to the uterus (fallopian tubes), but the implanting can occur in other locations. In rare cases, ectopic pregnancies occur on the ovary, intestine, pelvis, abdomen, or cervix. In an ectopic pregnancy, the fertilized egg does not have the ability to develop into a normal, healthy baby. °A ruptured ectopic pregnancy is one in which tearing or bursting of a fallopian tube causes internal bleeding. Often, there is intense lower abdominal pain, and vaginal bleeding sometimes occurs. Having an ectopic pregnancy can be life-threatening. If this dangerous condition is not treated, it can lead to blood loss, shock, or even death. °What are the causes? °The most common cause of this condition is damage to one of the fallopian tubes. A fallopian tube may be narrowed or blocked, and that keeps the fertilized egg from reaching the uterus. °What increases the risk? °This condition is more likely to develop in women of childbearing age who have different levels of risk. The levels of risk can be divided into three categories. °High risk °· You have gone through infertility treatment. °· You have had an ectopic pregnancy before. °· You have had surgery on the fallopian tubes, or another surgical procedure, such as an abortion. °· You have had surgery to have the fallopian tubes tied (tubal ligation). °· You have problems or diseases of the fallopian tubes. °· You have been exposed to diethylstilbestrol (DES). This medicine was used until 1971, and it had effects on babies whose mothers took the medicine. °· You become pregnant while using an IUD (intrauterine device) for birth control. °Moderate risk °· You have a history of infertility. °· You have had an STI (sexually transmitted infection). °· You have a history of pelvic inflammatory  disease (PID). °· You have scarring from endometriosis. °· You have multiple sexual partners. °· You smoke. °Low risk °· You have had pelvic surgery. °· You use vaginal douches. °· You became sexually active before age 18. °What are the signs or symptoms? °Common symptoms of this condition include normal pregnancy symptoms, such as missing a period, nausea, tiredness, abdominal pain, breast tenderness, and bleeding. However, ectopic pregnancy will have additional symptoms, such as: °· Pain with intercourse. °· Irregular vaginal bleeding or spotting. °· Cramping or pain on one side or in the lower abdomen. °· Fast heartbeat, low blood pressure, and sweating. °· Passing out while having a bowel movement. °Symptoms of a ruptured ectopic pregnancy and internal bleeding may include: °· Sudden, severe pain in the abdomen and pelvis. °· Dizziness, weakness, light-headedness, or fainting. °· Pain in the shoulder or neck area. °How is this diagnosed? °This condition is diagnosed by: °· A pelvic exam to locate pain or a mass in the abdomen. °· A pregnancy test. This blood test checks for the presence as well as the specific level of pregnancy hormone in the bloodstream. °· Ultrasound. This is performed if a pregnancy test is positive. In this test, a probe is inserted into the vagina. The probe will detect a fetus, possibly in a location other than the uterus. °· Taking a sample of uterus tissue (dilation and curettage, or D&C). °· Surgery to perform a visual exam of the inside of the abdomen using a thin, lighted tube that has a tiny camera on the end (laparoscope). °· Culdocentesis. This procedure involves inserting a needle at the top of   the vagina, behind the uterus. If blood is present in this area, it may indicate that a fallopian tube is torn. How is this treated? This condition is treated with medicine or surgery. Medicine  An injection of a medicine (methotrexate) may be given to cause the pregnancy tissue to be  absorbed. This medicine may save your fallopian tube. It may be given if: ? The diagnosis is made early, with no signs of active bleeding. ? The fallopian tube has not ruptured. ? You are considered to be a good candidate for the medicine. Usually, pregnancy hormone blood levels are checked after methotrexate treatment. This is to be sure that the medicine is effective. It may take 4-6 weeks for the pregnancy to be absorbed. Most pregnancies will be absorbed by 3 weeks. Surgery  A laparoscope may be used to remove the pregnancy tissue.  If severe internal bleeding occurs, a larger cut (incision) may be made in the lower abdomen (laparotomy) to remove the fetus and placenta. This is done to stop the bleeding.  Part or all of the fallopian tube may be removed (salpingectomy) along with the fetus and placenta. The fallopian tube may also be repaired during the surgery.  In very rare circumstances, removal of the uterus (hysterectomy) may be required.  After surgery, pregnancy hormone testing may be done to be sure that there is no pregnancy tissue left. Whether your treatment is medicine or surgery, you may receive a Rho (D) immune globulin shot to prevent problems with any future pregnancy. This shot may be given if:  You are Rh-negative and the baby's father is Rh-positive.  You are Rh-negative and you do not know the Rh type of the baby's father. Follow these instructions at home:  Rest and limit your activity after the procedure for as long as told by your health care provider.  Until your health care provider says that it is safe: ? Do not lift anything that is heavier than 10 lb (4.5 kg), or the limit that your health care provider tells you. ? Avoid physical exercise and any movement that requires effort (is strenuous).  To help prevent constipation: ? Eat a healthy diet that includes fruits, vegetables, and whole grains. ? Drink 6-8 glasses of water per day. Get help right away  if:  You develop worsening pain that is not relieved by medicine.  You have: ? A fever or chills. ? Vaginal bleeding. ? Redness and swelling at the incision site. ? Nausea and vomiting.  You feel dizzy or weak.  You feel light-headed or you faint. This information is not intended to replace advice given to you by your health care provider. Make sure you discuss any questions you have with your health care provider. Document Released: 02/26/2004 Document Revised: 12/31/2016 Document Reviewed: 08/20/2015 Elsevier Patient Education  [employer].

## 2018-07-30 NOTE — MAU Note (Signed)
Doing ok, feeling better. Not really having any pain. No bleeding.

## 2018-07-31 ENCOUNTER — Telehealth: Payer: Self-pay | Admitting: Family Medicine

## 2018-07-31 NOTE — Telephone Encounter (Signed)
Attempted to call patient about her appointment on 6/30 @ 1:30. No answer on the mobile number and could not leave a voicemail because it was not setup. Attempted to call the home number and the person who answered the phone stated that she was not there and if they could take a message. Advised the person who answered the phone to have the patient to call us back when she is available.

## 2018-08-01 ENCOUNTER — Other Ambulatory Visit: Payer: Self-pay

## 2018-08-01 ENCOUNTER — Ambulatory Visit (INDEPENDENT_AMBULATORY_CARE_PROVIDER_SITE_OTHER): Payer: Self-pay

## 2018-08-01 DIAGNOSIS — O3680X0 Pregnancy with inconclusive fetal viability, not applicable or unspecified: Secondary | ICD-10-CM

## 2018-08-01 LAB — GC/CHLAMYDIA PROBE AMP (~~LOC~~) NOT AT ARMC
Chlamydia: NEGATIVE
Neisseria Gonorrhea: NEGATIVE

## 2018-08-01 LAB — BETA HCG QUANT (REF LAB): hCG Quant: 2002 m[IU]/mL

## 2018-08-01 NOTE — Progress Notes (Signed)
Pt here today for STAT Beta Lab.  Pt denies any pain or bleeding.  Pt advised that she is here today for a pregnancy hormone level due to at risk for ectopic pregnancy.  Pt informed that due to specimen going to [place] the results may take longer than two hours.  Pt agreed with no further questions.   Received [facility] results for stat beta lab of 2002.  Notified Marcille Buffy, CNM pt's sx's and results.  Provider recommendation to have [place] in two weeks.  OB US scheduled for 08/15/18 @ 1400.  Notified pt provider's recommendation and [place] appt.  I advised pt to continue to monitor for increase in pain or vaginal bleeding to please go to MAU.  Pt verbalized understanding with no further questions.   Mel Almond, RN 08/01/18

## 2018-08-02 ENCOUNTER — Other Ambulatory Visit: Payer: Self-pay

## 2018-08-02 ENCOUNTER — Encounter (HOSPITAL_COMMUNITY): Payer: Self-pay

## 2018-08-02 ENCOUNTER — Inpatient Hospital Stay (HOSPITAL_COMMUNITY)
Admission: AD | Admit: 2018-08-02 | Discharge: 2018-08-02 | Disposition: A | Discharge status: Home / Self Care | Payer: Medicaid Other | Source: Ambulatory Visit | Attending: Family Medicine | Admitting: Family Medicine

## 2018-08-02 DIAGNOSIS — O3680X0 Pregnancy with inconclusive fetal viability, not applicable or unspecified: Secondary | ICD-10-CM

## 2018-08-02 DIAGNOSIS — Z3A01 Less than 8 weeks gestation of pregnancy: Secondary | ICD-10-CM | POA: Insufficient documentation

## 2018-08-02 DIAGNOSIS — O209 Hemorrhage in early pregnancy, unspecified: Secondary | ICD-10-CM | POA: Insufficient documentation

## 2018-08-02 DIAGNOSIS — Z6791 Unspecified blood type, Rh negative: Secondary | ICD-10-CM | POA: Diagnosis not present

## 2018-08-02 DIAGNOSIS — O26891 Other specified pregnancy related conditions, first trimester: Secondary | ICD-10-CM

## 2018-08-02 MED ORDER — RHO D IMMUNE GLOBULIN 1500 UNIT/2ML IJ SOSY
300.0000 ug | PREFILLED_SYRINGE | Freq: Once | INTRAMUSCULAR | Status: AC
  Administered 2018-08-02: 300 ug via INTRAMUSCULAR
  Filled 2018-08-02: qty 2

## 2018-08-02 NOTE — Discharge Instructions (Signed)
Vaginal Bleeding During Pregnancy, First Trimester  A small amount of bleeding from the vagina (spotting) is relatively common during early pregnancy. It usually stops on its own. Various things may cause bleeding or spotting during early pregnancy. Some bleeding may be related to the pregnancy, and some may not. In many cases, the bleeding is normal and is not a problem. However, bleeding can also be a sign of something serious. Be sure to tell your health care provider about any vaginal bleeding right away. Some possible causes of vaginal bleeding during the first trimester include:  Infection or inflammation of the cervix.  Growths (polyps) on the cervix.  Miscarriage or threatened miscarriage.  Pregnancy tissue developing outside of the uterus (ectopic pregnancy).  A mass of tissue developing in the uterus due to an egg being fertilized incorrectly (molar pregnancy). Follow these instructions at home: Activity  Follow instructions from your health care provider about limiting your activity. Ask what activities are safe for you.  If needed, make plans for someone to help with your regular activities.  Do not have sex or orgasms until your health care provider says that this is safe. General instructions  Take over-the-counter and prescription medicines only as told by your health care provider.  Pay attention to any changes in your symptoms.  Do not use tampons or douche.  Write down how many pads you use each day, how often you change pads, and how soaked (saturated) they are.  If you pass any tissue from your vagina, save the tissue so you can show it to your health care provider.  Keep all follow-up visits as told by your health care provider. This is important. Contact a health care provider if:  You have vaginal bleeding during any part of your pregnancy.  You have cramps or labor pains.  You have a fever. Get help right away if:  You have severe cramps in your  back or abdomen.  You pass large clots or a large amount of tissue from your vagina.  Your bleeding increases.  You feel light-headed or weak, or you faint.  You have chills.  You are leaking fluid or have a gush of fluid from your vagina. Summary  A small amount of bleeding (spotting) from the vagina is relatively common during early pregnancy.  Various things may cause bleeding or spotting in early pregnancy.  Be sure to tell your health care provider about any vaginal bleeding right away. This information is not intended to replace advice given to you by your health care provider. Make sure you discuss any questions you have with your health care provider. Document Released: 10/28/2004 Document Revised: 05/09/2018 Document Reviewed: 04/22/2016 Elsevier Patient Education  2020 [employer].

## 2018-08-02 NOTE — MAU Provider Note (Signed)
Chief Complaint: Vaginal Bleeding   First Provider Initiated Contact with Patient 08/02/18 0053        SUBJECTIVE HPI: Katrina Stewart is a 23 y.o. G3P1011 at [redacted]w[redacted]d by LMP who presents to maternity admissions reporting vaginal bleeding.  Passed some clots at home.  Denies any pain.   Was seen originally on 07/28/2018 for cramping.  Has been followed since then. HCG levels have doubled twice. Most recent HCG level was done 12 hours ago.   Initial US showed nothing in uterus.  Has [place] scheduled for followup.  She denies vaginal itching/burning, urinary symptoms, h/a, dizziness, n/v, or fever/chills.       Past Medical History:  Diagnosis Date  . Anxiety   . Depression    Past Surgical History:  Procedure Laterality Date  . BREAST SURGERY    . MASS EXCISION  10/29/2011   Procedure: EXCISION MASS;  Surgeon: Harl Bowie, MD;  Location: [facility];  Service: General;  Laterality: Bilateral;  excision of bilateral breast masses   Social History   Socioeconomic History  . Marital status: Single    Spouse name: Not on file  . Number of children: Not on file  . Years of education: Not on file  . Highest education level: Not on file  Occupational History  . Not on file  Social Needs  . Financial resource strain: Not on file  . Food insecurity    Worry: Not on file    Inability: Not on file  . Transportation needs    Medical: Not on file    Non-medical: Not on file  Tobacco Use  . Smoking status: Never Smoker  . Smokeless tobacco: Never Used  Substance and Sexual Activity  . Alcohol use: No    Alcohol/week: 2.0 - 3.0 standard drinks    Types: 2 - 3 Shots of liquor per week    Frequency: Never  . Drug use: No  . Sexual activity: Yes    Birth control/protection: None  Lifestyle  . Physical activity    Days per week: Not on file    Minutes per session: Not on file  . Stress: Not on file  Relationships  . Social Herbalist on phone: Not on file    Gets  together: Not on file    Attends religious service: Not on file    Active member of club or organization: Not on file    Attends meetings of clubs or organizations: Not on file    Relationship status: Not on file  . Intimate partner violence    Fear of current or ex partner: Not on file    Emotionally abused: Not on file    Physically abused: Not on file    Forced sexual activity: Not on file  Other Topics Concern  . Not on file  Social History Narrative  . Not on file   No current facility-administered medications on file prior to encounter.    Current Outpatient Medications on File Prior to Encounter  Medication Sig Dispense Refill  . acetaminophen (TYLENOL) 500 MG tablet Take 1,000 mg by mouth every 6 (six) hours as needed for moderate pain.     No Known Allergies  I have reviewed patient's Past Medical Hx, Surgical Hx, Family Hx, Social Hx, medications and allergies.   ROS:  Review of Systems  Constitutional: Negative for chills and fever.  Respiratory: Negative for shortness of breath.   Gastrointestinal: Negative for abdominal pain, constipation, diarrhea and  nausea.  Genitourinary: Positive for vaginal bleeding. Negative for pelvic pain.  Musculoskeletal: Negative for back pain.   Review of Systems  Other systems negative   Physical Exam  Physical Exam Vitals:   08/02/18 0053 08/02/18 0127  BP: (!) 129/59   Pulse: (!) 125 (!) 108  Resp: 16   Temp: 99.7 F (37.6 C) 99 F (37.2 C)  SpO2: 97%     Constitutional: Well-developed, well-nourished female in no acute distress.  Cardiovascular: normal rate Respiratory: normal effort GI: Abd soft, non-tender. Pos BS x 4 MS: Extremities nontender, no edema, normal ROM Neurologic: Alert and oriented x 4.  GU: Neg CVAT.  PELVIC EXAM: Cervix pink, visually closed, without lesion, small amount of dark red mucous discharge in vault.   Vaginal walls and external genitalia normal Bimanual exam: Cervix 0/long/high, firm,  anterior, neg CMT, uterus nontender, nonenlarged, adnexa without tenderness, enlargement, or mass   LAB RESULTS DONE YESTERDAY (12 hours ago)  Results for orders placed or performed in visit on 08/01/18 (from the past 24 hour(s))  Beta hCG quant (ref lab)     Status: None   Collection Time: 08/01/18  1:38 PM  Result Value Ref Range   hCG Quant 2,002 mIU/mL   Narrative   Performed at:  [address] [place], [place]  [id number] Lab Director: Rush Farmer MD, Phone:  [phone]    --/--/B NEG (06/26 1247)  IMAGING DONE on 07/28/2018  US Ob Less Than 14 Weeks With Ob Transvaginal  Result Date: 07/28/2018 CLINICAL DATA:  Cramping. EXAM: OBSTETRIC <14 WK [place] AND TRANSVAGINAL OB US TECHNIQUE: Both transabdominal and transvaginal ultrasound examinations were performed for complete evaluation of the gestation as well as the maternal uterus, adnexal regions, and pelvic cul-de-sac. Transvaginal technique was performed to assess early pregnancy. COMPARISON:  No recent prior. FINDINGS: Intrauterine gestational sac: None visualized Yolk sac:  No Embryo:  No Cardiac Activity: No Heart Rate:   bpm Subchorionic hemorrhage:  None visualized. Maternal uterus/adnexae: Tiny 1.7 cm right ovarian cyst incidentally noted. This appears to be a corpus luteal cyst. Trace free pelvic fluid. IMPRESSION: 1.  No intrauterine pregnancy identified. 2.  Tiny 1.7 cm right corpus luteal cyst.  Trace free pelvic fluid. Electronically Signed   By: Marcello Moores  Register   On: 07/28/2018 13:22    MAU Management/MDM: Discussed options with patient.  Since her HCG was done only 12 hours ago, repeating the test now would not shed much light on the status of the pregnancy.  Likewise, the [place] was done only 4 days ago.  Discussed bleeding can represent a normal pregnancy with bleeding (like subchorionic hemorrhage), ectopic or impending miscarriage.  Recommend followup as scheduled.  If she continued bleeding  over the next few days to a week, a repeat HCG level may help ascertain whether this represents a miscarriage  This bleeding/pain can represent a normal pregnancy with bleeding, spontaneous abortion or even an ectopic which can be life-threatening.  The process as listed above helps to determine which of these is present.  Since she is Rh Negative, will give a dose of Rhophylac tonight.   ASSESSMENT Pregnancy at 109w5d Bleeding in first trimester Pregnancy of unknown location Rh Negative status  PLAN Discharge home Rhophylac given Plan to repeat HCG level PRN over next week if continues to bleed.   Will repeat  Ultrasound as scheduled.    Ectopic and bleeding precautions   Pt stable at time of discharge. Encouraged to return  here or to other Urgent Care/ED if she develops worsening of symptoms, increase in pain, fever, or other concerning symptoms.    Hansel Feinstein CNM, MSN Certified Nurse-Midwife 08/02/2018  12:53 AM

## 2018-08-02 NOTE — MAU Note (Signed)
Pt states that when she went to the bathroom at 0000 she saw small blood clots in the toilet. Pt states she went again at [place] and saw one big blood clot.   Pt was told to come to MAU if she had any bleeding.

## 2018-08-03 LAB — RH IG WORKUP (INCLUDES ABO/RH)
ABO/RH(D): B NEG
Antibody Screen: NEGATIVE
Gestational Age(Wks): 5
Unit division: 0

## 2018-08-04 NOTE — Progress Notes (Signed)
I have reviewed the chart and agree with nursing staff's documentation of this patient's encounter.  Marcille Buffy, CNM 08/04/2018 2:06 PM

## 2018-08-15 ENCOUNTER — Ambulatory Visit (INDEPENDENT_AMBULATORY_CARE_PROVIDER_SITE_OTHER): Payer: Medicaid Other | Admitting: General Practice

## 2018-08-15 ENCOUNTER — Ambulatory Visit (HOSPITAL_COMMUNITY)
Admission: RE | Admit: 2018-08-15 | Discharge: 2018-08-15 | Disposition: A | Discharge status: Home / Self Care | Payer: Medicaid Other | Source: Ambulatory Visit | Attending: Advanced Practice Midwife | Admitting: Advanced Practice Midwife

## 2018-08-15 ENCOUNTER — Other Ambulatory Visit: Payer: Self-pay

## 2018-08-15 DIAGNOSIS — Z712 Person consulting for explanation of examination or test findings: Secondary | ICD-10-CM

## 2018-08-15 DIAGNOSIS — Z3A01 Less than 8 weeks gestation of pregnancy: Secondary | ICD-10-CM

## 2018-08-15 DIAGNOSIS — O3481 Maternal care for other abnormalities of pelvic organs, first trimester: Secondary | ICD-10-CM | POA: Diagnosis not present

## 2018-08-15 DIAGNOSIS — O3680X0 Pregnancy with inconclusive fetal viability, not applicable or unspecified: Secondary | ICD-10-CM | POA: Insufficient documentation

## 2018-08-15 MED ORDER — PRENATAL PLUS 27-1 MG PO TABS: 1.0000 | ORAL_TABLET | Freq: Every day | ORAL | 11 refills | Status: DC

## 2018-08-15 MED ORDER — PROMETHAZINE HCL 25 MG PO TABS: 25.0000 mg | ORAL_TABLET | Freq: Four times a day (QID) | ORAL | 0 refills | Status: DC | PRN

## 2018-08-15 NOTE — Progress Notes (Signed)
Patient presents to office today for viability ultrasound results. Reviewed results with Noni Saupe who finds single living IUP with small non concerning right ovarian cyst. Per Anderson Malta, Patient should begin prenatal care & cyst will resolve on its own.  Informed patient of results, provided pictures, and reviewed dating. Asked patient if she was taking any meds/vitamins and if she was having issues with nausea/vomiting. Patient verbalized understanding and states she occasionally takes tylenol but nothing else. Patient reports nausea/vomiting is an issue and would like Rx. Phenergan & PNV sent in per protocol & informed patient. Patient verbalized understanding & will return to initiate OB care.  Koren Bound RN BSN 08/15/18

## 2018-08-15 NOTE — Progress Notes (Signed)
I agree with the nurses note and documentation.    Noni Saupe I, NP 08/15/2018 4:07 PM

## 2018-08-29 ENCOUNTER — Other Ambulatory Visit: Payer: Self-pay

## 2018-08-29 ENCOUNTER — Ambulatory Visit (INDEPENDENT_AMBULATORY_CARE_PROVIDER_SITE_OTHER): Payer: Medicaid Other | Admitting: *Deleted

## 2018-08-29 DIAGNOSIS — Z8659 Personal history of other mental and behavioral disorders: Secondary | ICD-10-CM | POA: Insufficient documentation

## 2018-08-29 DIAGNOSIS — Z3490 Encounter for supervision of normal pregnancy, unspecified, unspecified trimester: Secondary | ICD-10-CM

## 2018-08-29 DIAGNOSIS — Z712 Person consulting for explanation of examination or test findings: Secondary | ICD-10-CM

## 2018-08-29 DIAGNOSIS — O0990 Supervision of high risk pregnancy, unspecified, unspecified trimester: Secondary | ICD-10-CM | POA: Insufficient documentation

## 2018-08-29 HISTORY — DX: Personal history of other mental and behavioral disorders: Z86.59

## 2018-08-29 MED ORDER — AMBULATORY NON FORMULARY MEDICATION: 1.0000 | 0 refills | Status: DC

## 2018-08-29 NOTE — Progress Notes (Signed)
Patient seen and assessed by nursing staff during this encounter. I have reviewed the chart and agree with the documentation and plan.  Mora Bellman, MD 08/29/2018 12:21 PM

## 2018-08-29 NOTE — Progress Notes (Signed)
I connected with  Katrina Stewart on 08/29/18 at 10:15 AM EDT by telephone and verified that I am speaking with the correct person using two identifiers.   I discussed the limitations, risks, security and privacy concerns of performing an evaluation and management service by telephone and the availability of in person appointments. I also discussed with the patient that there may be a patient responsible charge related to this service. The patient expressed understanding and agreed to proceed.  New Ob intake completed. Pt advised that her prenatal care will include a combination of virtual as well as face to face visits. Covid visitor restrictions explained. Pt is currently having vomiting about 3 times daily. Promethazine is somewhat helpful. Dietary measures discussed and recommendation were made. Per chart review, pt had abnormal pap (ASCUS) @ [facility] in November 2018. She was seen by Dr. Ihor Dow in January 2019 who recommended repeat Pap in November 2019. Pt reports she has not had repeat Pap. She was advised she will have Pap test during her first office visit as well as routine prenatal labs. Pt reports she was taking Sertraline for anxiety/depression but stopped taking several months ago because she lost her insurance and could not afford medication. She currently has pregnancy Medicaid active. Pt was advised to discuss this @ the time of her visit on 8/13. Pt agrees to check BP weekly and will record results to [facility] portal. BP cuff ordered. Pt voiced understanding of all information and instructions given.    Day, Ronnell Freshwater, RN 08/29/2018  11:09 AM

## 2018-08-30 ENCOUNTER — Telehealth: Payer: Medicaid Other

## 2018-09-11 ENCOUNTER — Telehealth: Payer: Self-pay

## 2018-09-11 DIAGNOSIS — Z3A01 Less than 8 weeks gestation of pregnancy: Secondary | ICD-10-CM

## 2018-09-11 MED ORDER — PROMETHAZINE HCL 25 MG PO TABS: 25.0000 mg | ORAL_TABLET | Freq: Four times a day (QID) | ORAL | 0 refills | Status: DC | PRN

## 2018-09-11 NOTE — Telephone Encounter (Addendum)
Pt called requesting a Rx refill.  Called pt and pt requested a refill on her Phenergan.  Per protocol, pt medication refilled.  Pt stated thank you with no further questions.

## 2018-09-14 ENCOUNTER — Ambulatory Visit (INDEPENDENT_AMBULATORY_CARE_PROVIDER_SITE_OTHER): Payer: Medicaid Other | Admitting: Obstetrics and Gynecology

## 2018-09-14 ENCOUNTER — Other Ambulatory Visit: Payer: Self-pay

## 2018-09-14 ENCOUNTER — Other Ambulatory Visit (HOSPITAL_COMMUNITY)
Admission: RE | Admit: 2018-09-14 | Discharge: 2018-09-14 | Disposition: A | Discharge status: Home / Self Care | Payer: Medicaid Other | Source: Ambulatory Visit | Attending: Obstetrics and Gynecology | Admitting: Obstetrics and Gynecology

## 2018-09-14 ENCOUNTER — Encounter: Payer: Self-pay | Admitting: Obstetrics and Gynecology

## 2018-09-14 VITALS — BP 127/78 | HR 93 | Wt 276.0 lb

## 2018-09-14 DIAGNOSIS — O99211 Obesity complicating pregnancy, first trimester: Secondary | ICD-10-CM

## 2018-09-14 DIAGNOSIS — Z3A11 11 weeks gestation of pregnancy: Secondary | ICD-10-CM

## 2018-09-14 DIAGNOSIS — O99341 Other mental disorders complicating pregnancy, first trimester: Secondary | ICD-10-CM

## 2018-09-14 DIAGNOSIS — Z6791 Unspecified blood type, Rh negative: Secondary | ICD-10-CM

## 2018-09-14 DIAGNOSIS — Z3491 Encounter for supervision of normal pregnancy, unspecified, first trimester: Secondary | ICD-10-CM | POA: Insufficient documentation

## 2018-09-14 DIAGNOSIS — F329 Major depressive disorder, single episode, unspecified: Secondary | ICD-10-CM | POA: Diagnosis not present

## 2018-09-14 DIAGNOSIS — Z8659 Personal history of other mental and behavioral disorders: Secondary | ICD-10-CM | POA: Diagnosis not present

## 2018-09-14 DIAGNOSIS — Z3481 Encounter for supervision of other normal pregnancy, first trimester: Secondary | ICD-10-CM | POA: Diagnosis not present

## 2018-09-14 DIAGNOSIS — O26891 Other specified pregnancy related conditions, first trimester: Secondary | ICD-10-CM

## 2018-09-14 DIAGNOSIS — F32A Depression, unspecified: Secondary | ICD-10-CM

## 2018-09-14 DIAGNOSIS — O99210 Obesity complicating pregnancy, unspecified trimester: Secondary | ICD-10-CM | POA: Insufficient documentation

## 2018-09-14 MED ORDER — VITAFOL GUMMIES 3.33-0.333-34.8 MG PO CHEW: 2.0000 | CHEWABLE_TABLET | Freq: Every day | ORAL | 6 refills | Status: AC

## 2018-09-14 MED ORDER — SERTRALINE HCL 50 MG PO TABS: 50.0000 mg | ORAL_TABLET | Freq: Every day | ORAL | 6 refills | Status: DC

## 2018-09-14 MED ORDER — ASPIRIN EC 81 MG PO TBEC: 81.0000 mg | DELAYED_RELEASE_TABLET | Freq: Every day | ORAL | 2 refills | Status: DC

## 2018-09-14 NOTE — Patient Instructions (Signed)
First Trimester of Pregnancy The first trimester of pregnancy is from week 1 until the end of week 13 (months 1 through 3). A week after a sperm fertilizes an egg, the egg will implant on the wall of the uterus. This embryo will begin to develop into a baby. Genes from you and your partner will form the baby. The female genes will determine whether the baby will be a boy or a girl. At 6-8 weeks, the eyes and face will be formed, and the heartbeat can be seen on ultrasound. At the end of 12 weeks, all the baby's organs will be formed. Now that you are pregnant, you will want to do everything you can to have a healthy baby. Two of the most important things are to get good prenatal care and to follow your health care provider's instructions. Prenatal care is all the medical care you receive before the baby's birth. This care will help prevent, find, and treat any problems during the pregnancy and childbirth. Body changes during your first trimester Your body goes through many changes during pregnancy. The changes vary from woman to woman.  You may gain or lose a couple of pounds at first.  You may feel sick to your stomach (nauseous) and you may throw up (vomit). If the vomiting is uncontrollable, call your health care provider.  You may tire easily.  You may develop headaches that can be relieved by medicines. All medicines should be approved by your health care provider.  You may urinate more often. Painful urination may mean you have a bladder infection.  You may develop heartburn as a result of your pregnancy.  You may develop constipation because certain hormones are causing the muscles that push stool through your intestines to slow down.  You may develop hemorrhoids or swollen veins (varicose veins).  Your breasts may begin to grow larger and become tender. Your nipples may stick out more, and the tissue that surrounds them (areola) may become darker.  Your gums may bleed and may be  sensitive to brushing and flossing.  Dark spots or blotches (chloasma, mask of pregnancy) may develop on your face. This will likely fade after the baby is born.  Your menstrual periods will stop.  You may have a loss of appetite.  You may develop cravings for certain kinds of food.  You may have changes in your emotions from day to day, such as being excited to be pregnant or being concerned that something may go wrong with the pregnancy and baby.  You may have more vivid and strange dreams.  You may have changes in your hair. These can include thickening of your hair, rapid growth, and changes in texture. Some women also have hair loss during or after pregnancy, or hair that feels dry or thin. Your hair will most likely return to normal after your baby is born. What to expect at prenatal visits During a routine prenatal visit:  You will be weighed to make sure you and the baby are growing normally.  Your blood pressure will be taken.  Your abdomen will be measured to track your baby's growth.  The fetal heartbeat will be listened to between weeks 10 and 14 of your pregnancy.  Test results from any previous visits will be discussed. Your health care provider may ask you:  How you are feeling.  If you are feeling the baby move.  If you have had any abnormal symptoms, such as leaking fluid, bleeding, severe headaches, or  abdominal cramping.  If you are using any tobacco products, including cigarettes, chewing tobacco, and electronic cigarettes.  If you have any questions. Other tests that may be performed during your first trimester include:  Blood tests to find your blood type and to check for the presence of any previous infections. The tests will also be used to check for low iron levels (anemia) and protein on red blood cells (Rh antibodies). Depending on your risk factors, or if you previously had diabetes during pregnancy, you may have tests to check for high blood sugar  that affects pregnant women (gestational diabetes).  Urine tests to check for infections, diabetes, or protein in the urine.  An ultrasound to confirm the proper growth and development of the baby.  Fetal screens for spinal cord problems (spina bifida) and Down syndrome.  HIV (human immunodeficiency virus) testing. Routine prenatal testing includes screening for HIV, unless you choose not to have this test.  You may need other tests to make sure you and the baby are doing well. Follow these instructions at home: Medicines  Follow your health care provider's instructions regarding medicine use. Specific medicines may be either safe or unsafe to take during pregnancy.  Take a prenatal vitamin that contains at least 600 micrograms (mcg) of folic acid.  If you develop constipation, try taking a stool softener if your health care provider approves. Eating and drinking   Eat a balanced diet that includes fresh fruits and vegetables, whole grains, good sources of protein such as meat, eggs, or tofu, and low-fat dairy. Your health care provider will help you determine the amount of weight gain that is right for you.  Avoid raw meat and uncooked cheese. These carry germs that can cause birth defects in the baby.  Eating four or five small meals rather than three large meals a day may help relieve nausea and vomiting. If you start to feel nauseous, eating a few soda crackers can be helpful. Drinking liquids between meals, instead of during meals, also seems to help ease nausea and vomiting.  Limit foods that are high in fat and processed sugars, such as fried and sweet foods.  To prevent constipation: ? Eat foods that are high in fiber, such as fresh fruits and vegetables, whole grains, and beans. ? Drink enough fluid to keep your urine clear or pale yellow. Activity  Exercise only as directed by your health care provider. Most women can continue their usual exercise routine during  pregnancy. Try to exercise for 30 minutes at least 5 days a week. Exercising will help you: ? Control your weight. ? Stay in shape. ? Be prepared for labor and delivery.  Experiencing pain or cramping in the lower abdomen or lower back is a good sign that you should stop exercising. Check with your health care provider before continuing with normal exercises.  Try to avoid standing for long periods of time. Move your legs often if you must stand in one place for a long time.  Avoid heavy lifting.  Wear low-heeled shoes and practice good posture.  You may continue to have sex unless your health care provider tells you not to. Relieving pain and discomfort  Wear a good support bra to relieve breast tenderness.  Take warm sitz baths to soothe any pain or discomfort caused by hemorrhoids. Use hemorrhoid cream if your health care provider approves.  Rest with your legs elevated if you have leg cramps or low back pain.  If you develop varicose veins  in your legs, wear support hose. Elevate your feet for 15 minutes, 3-4 times a day. Limit salt in your diet. Prenatal care  Schedule your prenatal visits by the twelfth week of pregnancy. They are usually scheduled monthly at first, then more often in the last 2 months before delivery.  Write down your questions. Take them to your prenatal visits.  Keep all your prenatal visits as told by your health care provider. This is important. Safety  Wear your seat belt at all times when driving.  Make a list of emergency phone numbers, including numbers for family, friends, the hospital, and police and fire departments. General instructions  Ask your health care provider for a referral to a local prenatal education class. Begin classes no later than the beginning of month 6 of your pregnancy.  Ask for help if you have counseling or nutritional needs during pregnancy. Your health care provider can offer advice or refer you to specialists for help  with various needs.  Do not use hot tubs, steam rooms, or saunas.  Do not douche or use tampons or scented sanitary pads.  Do not cross your legs for long periods of time.  Avoid cat litter boxes and soil used by cats. These carry germs that can cause birth defects in the baby and possibly loss of the fetus by miscarriage or stillbirth.  Avoid all smoking, herbs, alcohol, and medicines not prescribed by your health care provider. Chemicals in these products affect the formation and growth of the baby.  Do not use any products that contain nicotine or tobacco, such as cigarettes and e-cigarettes. If you need help quitting, ask your health care provider. You may receive counseling support and other resources to help you quit.  Schedule a dentist appointment. At home, brush your teeth with a soft toothbrush and be gentle when you floss. Contact a health care provider if:  You have dizziness.  You have mild pelvic cramps, pelvic pressure, or nagging pain in the abdominal area.  You have persistent nausea, vomiting, or diarrhea.  You have a bad smelling vaginal discharge.  You have pain when you urinate.  You notice increased swelling in your face, hands, legs, or ankles.  You are exposed to fifth disease or chickenpox.  You are exposed to [place] measles (rubella) and have never had it. Get help right away if:  You have a fever.  You are leaking fluid from your vagina.  You have spotting or bleeding from your vagina.  You have severe abdominal cramping or pain.  You have rapid weight gain or loss.  You vomit blood or material that looks like coffee grounds.  You develop a severe headache.  You have shortness of breath.  You have any kind of trauma, such as from a fall or a car accident. Summary  The first trimester of pregnancy is from week 1 until the end of week 13 (months 1 through 3).  Your body goes through many changes during pregnancy. The changes vary from  woman to woman.  You will have routine prenatal visits. During those visits, your health care provider will examine you, discuss any test results you may have, and talk with you about how you are feeling. This information is not intended to replace advice given to you by your health care provider. Make sure you discuss any questions you have with your health care provider. Document Released: 01/12/2001 Document Revised: 12/31/2016 Document Reviewed: 12/31/2015 Elsevier Patient Education  2020 [employer].  Second Trimester of Pregnancy The second trimester is from week 14 through week 27 (months 4 through 6). The second trimester is often a time when you feel your best. Your body has adjusted to being pregnant, and you begin to feel better physically. Usually, morning sickness has lessened or quit completely, you may have more energy, and you may have an increase in appetite. The second trimester is also a time when the fetus is growing rapidly. At the end of the sixth month, the fetus is about 9 inches long and weighs about 1 pounds. You will likely begin to feel the baby move (quickening) between 16 and 20 weeks of pregnancy. Body changes during your second trimester Your body continues to go through many changes during your second trimester. The changes vary from woman to woman.  Your weight will continue to increase. You will notice your lower abdomen bulging out.  You may begin to get stretch marks on your hips, abdomen, and breasts.  You may develop headaches that can be relieved by medicines. The medicines should be approved by your health care provider.  You may urinate more often because the fetus is pressing on your bladder.  You may develop or continue to have heartburn as a result of your pregnancy.  You may develop constipation because certain hormones are causing the muscles that push waste through your intestines to slow down.  You may develop hemorrhoids or swollen,  bulging veins (varicose veins).  You may have back pain. This is caused by: ? Weight gain. ? Pregnancy hormones that are relaxing the joints in your pelvis. ? A shift in weight and the muscles that support your balance.  Your breasts will continue to grow and they will continue to become tender.  Your gums may bleed and may be sensitive to brushing and flossing.  Dark spots or blotches (chloasma, mask of pregnancy) may develop on your face. This will likely fade after the baby is born.  A dark line from your belly button to the pubic area (linea nigra) may appear. This will likely fade after the baby is born.  You may have changes in your hair. These can include thickening of your hair, rapid growth, and changes in texture. Some women also have hair loss during or after pregnancy, or hair that feels dry or thin. Your hair will most likely return to normal after your baby is born. What to expect at prenatal visits During a routine prenatal visit:  You will be weighed to make sure you and the fetus are growing normally.  Your blood pressure will be taken.  Your abdomen will be measured to track your baby's growth.  The fetal heartbeat will be listened to.  Any test results from the previous visit will be discussed. Your health care provider may ask you:  How you are feeling.  If you are feeling the baby move.  If you have had any abnormal symptoms, such as leaking fluid, bleeding, severe headaches, or abdominal cramping.  If you are using any tobacco products, including cigarettes, chewing tobacco, and electronic cigarettes.  If you have any questions. Other tests that may be performed during your second trimester include:  Blood tests that check for: ? Low iron levels (anemia). ? High blood sugar that affects pregnant women (gestational diabetes) between 70 and 28 weeks. ? Rh antibodies. This is to check for a protein on red blood cells (Rh factor).  Urine tests to check  for infections, diabetes, or protein in  the urine.  An ultrasound to confirm the proper growth and development of the baby.  An amniocentesis to check for possible genetic problems.  Fetal screens for spina bifida and Down syndrome.  HIV (human immunodeficiency virus) testing. Routine prenatal testing includes screening for HIV, unless you choose not to have this test. Follow these instructions at home: Medicines  Follow your health care provider's instructions regarding medicine use. Specific medicines may be either safe or unsafe to take during pregnancy.  Take a prenatal vitamin that contains at least 600 micrograms (mcg) of folic acid.  If you develop constipation, try taking a stool softener if your health care provider approves. Eating and drinking   Eat a balanced diet that includes fresh fruits and vegetables, whole grains, good sources of protein such as meat, eggs, or tofu, and low-fat dairy. Your health care provider will help you determine the amount of weight gain that is right for you.  Avoid raw meat and uncooked cheese. These carry germs that can cause birth defects in the baby.  If you have low calcium intake from food, talk to your health care provider about whether you should take a daily calcium supplement.  Limit foods that are high in fat and processed sugars, such as fried and sweet foods.  To prevent constipation: ? Drink enough fluid to keep your urine clear or pale yellow. ? Eat foods that are high in fiber, such as fresh fruits and vegetables, whole grains, and beans. Activity  Exercise only as directed by your health care provider. Most women can continue their usual exercise routine during pregnancy. Try to exercise for 30 minutes at least 5 days a week. Stop exercising if you experience uterine contractions.  Avoid heavy lifting, wear low heel shoes, and practice good posture.  A sexual relationship may be continued unless your health care provider  directs you otherwise. Relieving pain and discomfort  Wear a good support bra to prevent discomfort from breast tenderness.  Take warm sitz baths to soothe any pain or discomfort caused by hemorrhoids. Use hemorrhoid cream if your health care provider approves.  Rest with your legs elevated if you have leg cramps or low back pain.  If you develop varicose veins, wear support hose. Elevate your feet for 15 minutes, 3-4 times a day. Limit salt in your diet. Prenatal Care  Write down your questions. Take them to your prenatal visits.  Keep all your prenatal visits as told by your health care provider. This is important. Safety  Wear your seat belt at all times when driving.  Make a list of emergency phone numbers, including numbers for family, friends, the hospital, and police and fire departments. General instructions  Ask your health care provider for a referral to a local prenatal education class. Begin classes no later than the beginning of month 6 of your pregnancy.  Ask for help if you have counseling or nutritional needs during pregnancy. Your health care provider can offer advice or refer you to specialists for help with various needs.  Do not use hot tubs, steam rooms, or saunas.  Do not douche or use tampons or scented sanitary pads.  Do not cross your legs for long periods of time.  Avoid cat litter boxes and soil used by cats. These carry germs that can cause birth defects in the baby and possibly loss of the fetus by miscarriage or stillbirth.  Avoid all smoking, herbs, alcohol, and unprescribed drugs. Chemicals in these products can affect the formation  and growth of the baby.  Do not use any products that contain nicotine or tobacco, such as cigarettes and e-cigarettes. If you need help quitting, ask your health care provider.  Visit your dentist if you have not gone yet during your pregnancy. Use a soft toothbrush to brush your teeth and be gentle when you  floss. Contact a health care provider if:  You have dizziness.  You have mild pelvic cramps, pelvic pressure, or nagging pain in the abdominal area.  You have persistent nausea, vomiting, or diarrhea.  You have a bad smelling vaginal discharge.  You have pain when you urinate. Get help right away if:  You have a fever.  You are leaking fluid from your vagina.  You have spotting or bleeding from your vagina.  You have severe abdominal cramping or pain.  You have rapid weight gain or weight loss.  You have shortness of breath with chest pain.  You notice sudden or extreme swelling of your face, hands, ankles, feet, or legs.  You have not felt your baby move in over an hour.  You have severe headaches that do not go away when you take medicine.  You have vision changes. Summary  The second trimester is from week 14 through week 27 (months 4 through 6). It is also a time when the fetus is growing rapidly.  Your body goes through many changes during pregnancy. The changes vary from woman to woman.  Avoid all smoking, herbs, alcohol, and unprescribed drugs. These chemicals affect the formation and growth your baby.  Do not use any tobacco products, such as cigarettes, chewing tobacco, and e-cigarettes. If you need help quitting, ask your health care provider.  Contact your health care provider if you have any questions. Keep all prenatal visits as told by your health care provider. This is important. This information is not intended to replace advice given to you by your health care provider. Make sure you discuss any questions you have with your health care provider. Document Released: 01/12/2001 Document Revised: 05/12/2018 Document Reviewed: 02/24/2016 Elsevier Patient Education  2020 [employer].   Contraception Choices Contraception, also called birth control, refers to methods or devices that prevent pregnancy. Hormonal methods Contraceptive implant  A  contraceptive implant is a thin, plastic tube that contains a hormone. It is inserted into the upper part of the arm. It can remain in place for up to 3 years. Progestin-only injections Progestin-only injections are injections of progestin, a synthetic form of the hormone progesterone. They are given every 3 months by a health care provider. Birth control pills  Birth control pills are pills that contain hormones that prevent pregnancy. They must be taken once a day, preferably at the same time each day. Birth control patch  The birth control patch contains hormones that prevent pregnancy. It is placed on the skin and must be changed once a week for three weeks and removed on the fourth week. A prescription is needed to use this method of contraception. Vaginal ring  A vaginal ring contains hormones that prevent pregnancy. It is placed in the vagina for three weeks and removed on the fourth week. After that, the process is repeated with a new ring. A prescription is needed to use this method of contraception. Emergency contraceptive Emergency contraceptives prevent pregnancy after unprotected sex. They come in pill form and can be taken up to 5 days after sex. They work best the sooner they are taken after having sex. Most emergency contraceptives  are available without a prescription. This method should not be used as your only form of birth control. Barrier methods Female condom  A female condom is a thin sheath that is worn over the penis during sex. Condoms keep sperm from going inside a woman's body. They can be used with a spermicide to increase their effectiveness. They should be disposed after a single use. Female condom  A female condom is a soft, loose-fitting sheath that is put into the vagina before sex. The condom keeps sperm from going inside a woman's body. They should be disposed after a single use. Diaphragm  A diaphragm is a soft, dome-shaped barrier. It is inserted into the  vagina before sex, along with a spermicide. The diaphragm blocks sperm from entering the uterus, and the spermicide kills sperm. A diaphragm should be left in the vagina for 6-8 hours after sex and removed within 24 hours. A diaphragm is prescribed and fitted by a health care provider. A diaphragm should be replaced every 1-2 years, after giving birth, after gaining more than 15 lb (6.8 kg), and after pelvic surgery. Cervical cap  A cervical cap is a round, soft latex or plastic cup that fits over the cervix. It is inserted into the vagina before sex, along with spermicide. It blocks sperm from entering the uterus. The cap should be left in place for 6-8 hours after sex and removed within 48 hours. A cervical cap must be prescribed and fitted by a health care provider. It should be replaced every 2 years. Sponge  A sponge is a soft, circular piece of polyurethane foam with spermicide on it. The sponge helps block sperm from entering the uterus, and the spermicide kills sperm. To use it, you make it wet and then insert it into the vagina. It should be inserted before sex, left in for at least 6 hours after sex, and removed and thrown away within 30 hours. Spermicides Spermicides are chemicals that kill or block sperm from entering the cervix and uterus. They can come as a cream, jelly, suppository, foam, or tablet. A spermicide should be inserted into the vagina with an applicator at least 83-38 minutes before sex to allow time for it to work. The process must be repeated every time you have sex. Spermicides do not require a prescription. Intrauterine contraception Intrauterine device (IUD) An IUD is a T-shaped device that is put in a woman's uterus. There are two types:  Hormone IUD.This type contains progestin, a synthetic form of the hormone progesterone. This type can stay in place for 3-5 years.  Copper IUD.This type is wrapped in copper wire. It can stay in place for 10 years.  Permanent  methods of contraception Female tubal ligation In this method, a woman's fallopian tubes are sealed, tied, or blocked during surgery to prevent eggs from traveling to the uterus. Hysteroscopic sterilization In this method, a small, flexible insert is placed into each fallopian tube. The inserts cause scar tissue to form in the fallopian tubes and block them, so sperm cannot reach an egg. The procedure takes about 3 months to be effective. Another form of birth control must be used during those 3 months. Female sterilization This is a procedure to tie off the tubes that carry sperm (vasectomy). After the procedure, the man can still ejaculate fluid (semen). Natural planning methods Natural family planning In this method, a couple does not have sex on days when the woman could become pregnant. Calendar method This means keeping track of  the length of each menstrual cycle, identifying the days when pregnancy can happen, and not having sex on those days. Ovulation method In this method, a couple avoids sex during ovulation. Symptothermal method This method involves not having sex during ovulation. The woman typically checks for ovulation by watching changes in her temperature and in the consistency of cervical mucus. Post-ovulation method In this method, a couple waits to have sex until after ovulation. Summary  Contraception, also called birth control, means methods or devices that prevent pregnancy.  Hormonal methods of contraception include implants, injections, pills, patches, vaginal rings, and emergency contraceptives.  Barrier methods of contraception can include female condoms, female condoms, diaphragms, cervical caps, sponges, and spermicides.  There are two types of IUDs (intrauterine devices). An IUD can be put in a woman's uterus to prevent pregnancy for 3-5 years.  Permanent sterilization can be done through a procedure for males, females, or both.  Natural family planning methods  involve not having sex on days when the woman could become pregnant. This information is not intended to replace advice given to you by your health care provider. Make sure you discuss any questions you have with your health care provider. Document Released: 01/18/2005 Document Revised: 01/20/2017 Document Reviewed: 02/21/2016 Elsevier Patient Education  2020 [employer].   Breastfeeding  Choosing to breastfeed is one of the best decisions you can make for yourself and your baby. A change in hormones during pregnancy causes your breasts to make breast milk in your milk-producing glands. Hormones prevent breast milk from being released before your baby is born. They also prompt milk flow after birth. Once breastfeeding has begun, thoughts of your baby, as well as his or her sucking or crying, can stimulate the release of milk from your milk-producing glands. Benefits of breastfeeding Research shows that breastfeeding offers many health benefits for infants and mothers. It also offers a cost-free and convenient way to feed your baby. For your baby  Your first milk (colostrum) helps your baby's digestive system to function better.  Special cells in your milk (antibodies) help your baby to fight off infections.  Breastfed babies are less likely to develop asthma, allergies, obesity, or type 2 diabetes. They are also at lower risk for sudden infant death syndrome (SIDS).  Nutrients in breast milk are better able to meet your babys needs compared to infant formula.  Breast milk improves your baby's brain development. For you  Breastfeeding helps to create a very special bond between you and your baby.  Breastfeeding is convenient. Breast milk costs nothing and is always available at the correct temperature.  Breastfeeding helps to burn calories. It helps you to lose the weight that you gained during pregnancy.  Breastfeeding makes your uterus return faster to its size before pregnancy. It  also slows bleeding (lochia) after you give birth.  Breastfeeding helps to lower your risk of developing type 2 diabetes, osteoporosis, rheumatoid arthritis, cardiovascular disease, and breast, ovarian, uterine, and endometrial cancer later in life. Breastfeeding basics Starting breastfeeding  Find a comfortable place to sit or lie down, with your neck and back well-supported.  Place a pillow or a rolled-up blanket under your baby to bring him or her to the level of your breast (if you are seated). Nursing pillows are specially designed to help support your arms and your baby while you breastfeed.  Make sure that your baby's tummy (abdomen) is facing your abdomen.  Gently massage your breast. With your fingertips, massage from the outer edges  of your breast inward toward the nipple. This encourages milk flow. If your milk flows slowly, you may need to continue this action during the feeding.  Support your breast with 4 fingers underneath and your thumb above your nipple (make the letter "C" with your hand). Make sure your fingers are well away from your nipple and your babys mouth.  Stroke your baby's lips gently with your finger or nipple.  When your baby's mouth is open wide enough, quickly bring your baby to your breast, placing your entire nipple and as much of the areola as possible into your baby's mouth. The areola is the colored area around your nipple. ? More areola should be visible above your baby's upper lip than below the lower lip. ? Your baby's lips should be opened and extended outward (flanged) to ensure an adequate, comfortable latch. ? Your baby's tongue should be between his or her lower gum and your breast.  Make sure that your baby's mouth is correctly positioned around your nipple (latched). Your baby's lips should create a seal on your breast and be turned out (everted).  It is common for your baby to suck about 2-3 minutes in order to start the flow of breast  milk. Latching Teaching your baby how to latch onto your breast properly is very important. An improper latch can cause nipple pain, decreased milk supply, and poor weight gain in your baby. Also, if your baby is not latched onto your nipple properly, he or she may swallow some air during feeding. This can make your baby fussy. Burping your baby when you switch breasts during the feeding can help to get rid of the air. However, teaching your baby to latch on properly is still the best way to prevent fussiness from swallowing air while breastfeeding. Signs that your baby has successfully latched onto your nipple  Silent tugging or silent sucking, without causing you pain. Infant's lips should be extended outward (flanged).  Swallowing heard between every 3-4 sucks once your milk has started to flow (after your let-down milk reflex occurs).  Muscle movement above and in front of his or her ears while sucking. Signs that your baby has not successfully latched onto your nipple  Sucking sounds or smacking sounds from your baby while breastfeeding.  Nipple pain. If you think your baby has not latched on correctly, slip your finger into the corner of your babys mouth to break the suction and place it between your baby's gums. Attempt to start breastfeeding again. Signs of successful breastfeeding Signs from your baby  Your baby will gradually decrease the number of sucks or will completely stop sucking.  Your baby will fall asleep.  Your baby's body will relax.  Your baby will retain a small amount of milk in his or her mouth.  Your baby will let go of your breast by himself or herself. Signs from you  Breasts that have increased in firmness, weight, and size 1-3 hours after feeding.  Breasts that are softer immediately after breastfeeding.  Increased milk volume, as well as a change in milk consistency and color by the fifth day of breastfeeding.  Nipples that are not sore, cracked, or  bleeding. Signs that your baby is getting enough milk  Wetting at least 1-2 diapers during the first 24 hours after birth.  Wetting at least 5-6 diapers every 24 hours for the first week after birth. The urine should be clear or pale yellow by the age of 5 days.  Wetting  6-8 diapers every 24 hours as your baby continues to grow and develop.  At least 3 stools in a 24-hour period by the age of 5 days. The stool should be soft and yellow.  At least 3 stools in a 24-hour period by the age of 7 days. The stool should be seedy and yellow.  No loss of weight greater than 10% of birth weight during the first 3 days of life.  Average weight gain of 4-7 oz (113-198 g) per week after the age of 4 days.  Consistent daily weight gain by the age of 5 days, without weight loss after the age of 2 weeks. After a feeding, your baby may spit up a small amount of milk. This is normal. Breastfeeding frequency and duration Frequent feeding will help you make more milk and can prevent sore nipples and extremely full breasts (breast engorgement). Breastfeed when you feel the need to reduce the fullness of your breasts or when your baby shows signs of hunger. This is called "breastfeeding on demand." Signs that your baby is hungry include:  Increased alertness, activity, or restlessness.  Movement of the head from side to side.  Opening of the mouth when the corner of the mouth or cheek is stroked (rooting).  Increased sucking sounds, smacking lips, cooing, sighing, or squeaking.  Hand-to-mouth movements and sucking on fingers or hands.  Fussing or crying. Avoid introducing a pacifier to your baby in the first 4-6 weeks after your baby is born. After this time, you may choose to use a pacifier. Research has shown that pacifier use during the first year of a baby's life decreases the risk of sudden infant death syndrome (SIDS). Allow your baby to feed on each breast as long as he or she wants. When your  baby unlatches or falls asleep while feeding from the first breast, offer the second breast. Because newborns are often sleepy in the first few weeks of life, you may need to awaken your baby to get him or her to feed. Breastfeeding times will vary from baby to baby. However, the following rules can serve as a guide to help you make sure that your baby is properly fed:  Newborns (babies 40 weeks of age or younger) may breastfeed every 1-3 hours.  Newborns should not go without breastfeeding for longer than 3 hours during the day or 5 hours during the night.  You should breastfeed your baby a minimum of 8 times in a 24-hour period. Breast milk pumping     Pumping and storing breast milk allows you to make sure that your baby is exclusively fed your breast milk, even at times when you are unable to breastfeed. This is especially important if you go back to work while you are still breastfeeding, or if you are not able to be present during feedings. Your lactation consultant can help you find a method of pumping that works best for you and give you guidelines about how long it is safe to store breast milk. Caring for your breasts while you breastfeed Nipples can become dry, cracked, and sore while breastfeeding. The following recommendations can help keep your breasts moisturized and healthy:  Avoid using soap on your nipples.  Wear a supportive bra designed especially for nursing. Avoid wearing underwire-style bras or extremely tight bras (sports bras).  Air-dry your nipples for 3-4 minutes after each feeding.  Use only cotton bra pads to absorb leaked breast milk. Leaking of breast milk between feedings is normal.  Use lanolin on your nipples after breastfeeding. Lanolin helps to maintain your skin's normal moisture barrier. Pure lanolin is not harmful (not toxic) to your baby. You may also hand express a few drops of breast milk and gently massage that milk into your nipples and allow the milk  to air-dry. In the first few weeks after giving birth, some women experience breast engorgement. Engorgement can make your breasts feel heavy, warm, and tender to the touch. Engorgement peaks within 3-5 days after you give birth. The following recommendations can help to ease engorgement:  Completely empty your breasts while breastfeeding or pumping. You may want to start by applying warm, moist heat (in the shower or with warm, water-soaked hand towels) just before feeding or pumping. This increases circulation and helps the milk flow. If your baby does not completely empty your breasts while breastfeeding, pump any extra milk after he or she is finished.  Apply ice packs to your breasts immediately after breastfeeding or pumping, unless this is too uncomfortable for you. To do this: ? Put ice in a plastic bag. ? Place a towel between your skin and the bag. ? Leave the ice on for 20 minutes, 2-3 times a day.  Make sure that your baby is latched on and positioned properly while breastfeeding. If engorgement persists after 48 hours of following these recommendations, contact your health care provider or a Science writer. Overall health care recommendations while breastfeeding  Eat 3 healthy meals and 3 snacks every day. Well-nourished mothers who are breastfeeding need an additional 450-500 calories a day. You can meet this requirement by increasing the amount of a balanced diet that you eat.  Drink enough water to keep your urine pale yellow or clear.  Rest often, relax, and continue to take your prenatal vitamins to prevent fatigue, stress, and low vitamin and mineral levels in your body (nutrient deficiencies).  Do not use any products that contain nicotine or tobacco, such as cigarettes and e-cigarettes. Your baby may be harmed by chemicals from cigarettes that pass into breast milk and exposure to secondhand smoke. If you need help quitting, ask your health care provider.  Avoid  alcohol.  Do not use illegal drugs or marijuana.  Talk with your health care provider before taking any medicines. These include over-the-counter and prescription medicines as well as vitamins and herbal supplements. Some medicines that may be harmful to your baby can pass through breast milk.  It is possible to become pregnant while breastfeeding. If birth control is desired, ask your health care provider about options that will be safe while breastfeeding your baby. Where to find more information: [employer]: www.llli.org Contact a health care provider if:  You feel like you want to stop breastfeeding or have become frustrated with breastfeeding.  Your nipples are cracked or bleeding.  Your breasts are red, tender, or warm.  You have: ? Painful breasts or nipples. ? A swollen area on either breast. ? A fever or chills. ? Nausea or vomiting. ? Drainage other than breast milk from your nipples.  Your breasts do not become full before feedings by the fifth day after you give birth.  You feel sad and depressed.  Your baby is: ? Too sleepy to eat well. ? Having trouble sleeping. ? More than 76 week old and wetting fewer than 6 diapers in a 24-hour period. ? Not gaining weight by 47 days of age.  Your baby has fewer than 3 stools in a 24-hour period.  Your baby's skin or the white parts of his or her eyes become yellow. Get help right away if:  Your baby is overly tired (lethargic) and does not want to wake up and feed.  Your baby develops an unexplained fever. Summary  Breastfeeding offers many health benefits for infant and mothers.  Try to breastfeed your infant when he or she shows early signs of hunger.  Gently tickle or stroke your baby's lips with your finger or nipple to allow the baby to open his or her mouth. Bring the baby to your breast. Make sure that much of the areola is in your baby's mouth. Offer one side and burp the baby before you offer  the other side.  Talk with your health care provider or lactation consultant if you have questions or you face problems as you breastfeed. This information is not intended to replace advice given to you by your health care provider. Make sure you discuss any questions you have with your health care provider. Document Released: 01/18/2005 Document Revised: 04/14/2017 Document Reviewed: 02/20/2016 Elsevier Patient Education  2020 [employer].

## 2018-09-14 NOTE — Progress Notes (Addendum)
Anatomy US scheduled October 2nd @ 1330.  Pt notified.   Home Medicaid Form Completed

## 2018-09-14 NOTE — Progress Notes (Signed)
Subjective:    Katrina Stewart is a [id number] [redacted]w[redacted]d being seen today for her first obstetrical visit.  Her obstetrical history is significant for obesity and history of depression during last pregnancy. Patient does intend to breast feed. Pregnancy history fully reviewed. Patient reports depressive symptoms without suicidal/homicidal ideations and desires to be restarted on zoloft  Patient reports no complaints.  Vitals:   09/14/18 1509  BP: 127/78  Pulse: 93  Weight: 276 lb (125.2 kg)    HISTORY: OB History  Gravida Para Term Preterm AB Living  3 1 1   1 1   SAB TAB Ectopic Multiple Live Births  1 0   0 1    # Outcome Date GA Lbr Len/2nd Weight Sex Delivery Anes PTL Lv  3 Current           2 Term 02/22/16 [redacted]w[redacted]d 24:20 / 00:26 7 lb 9.3 oz (3.44 kg) M Vag-Spont EPI  LIV  1 SAB            Past Medical History:  Diagnosis Date  . Anxiety   . Depression   . Vaginal Pap smear, abnormal    Past Surgical History:  Procedure Laterality Date  . BREAST SURGERY  2013   Lt & Rt excision juvenile fibroadenoma  . MASS EXCISION  10/29/2011   Procedure: EXCISION MASS;  Surgeon: Harl Bowie, MD;  Location: [facility];  Service: General;  Laterality: Bilateral;  excision of bilateral breast masses   Family History  Problem Relation Age of Onset  . Cancer Maternal Grandmother        lung cancer  . Cancer Other        bladder cancer  . Asthma Brother      Exam    Uterus:   12-weeks in size  Pelvic Exam:    Perineum: Normal Perineum   Vulva: normal   Vagina:  normal mucosa, normal discharge   pH:    Cervix: multiparous appearance and cervix is closed and long   Adnexa: normal adnexa and no mass, fullness, tenderness   Bony Pelvis: gynecoid  System: Breast:  normal appearance, no masses or tenderness   Skin: normal coloration and turgor, no rashes    Neurologic: oriented, no focal deficits   Extremities: normal strength, tone, and muscle mass   HEENT extra ocular movement  intact   Mouth/Teeth mucous membranes moist, pharynx normal without lesions and dental hygiene good   Neck supple and no masses   Cardiovascular: regular rate and rhythm   Respiratory:  appears well, vitals normal, no respiratory distress, acyanotic, normal RR, chest clear, no wheezing, crepitations, rhonchi, normal symmetric air entry   Abdomen: soft, non-tender; bowel sounds normal; no masses,  no organomegaly   Urinary:       Assessment:    Pregnancy: G3P1011 Patient Active Problem List   Diagnosis Date Noted  . Maternal obesity affecting pregnancy, antepartum 09/14/2018  . Supervision of low-risk pregnancy 08/29/2018  . History of depression 08/29/2018  . Rh negative state in antepartum period 09/03/2015  . Rubella non-immune status, antepartum 09/03/2015  . Severe episode of recurrent major depressive disorder, without psychotic features ([place])   . Alcohol use disorder         Plan:     Initial labs drawn. Prenatal vitamins. Problem list reviewed and updated. Genetic Screening discussed : panorama ordered.  Ultrasound discussed; fetal survey: ordered. Rx ASA provided to start tomorrow Rx zoloft provided and patient referred to integrated behavioral health  Follow up in 4 weeks. 50% of 30 min visit spent on counseling and coordination of care.     Baylynn Shifflett 09/14/2018

## 2018-09-15 MED ORDER — BLOOD PRESSURE KIT DEVI: 1.0000 | 0 refills | Status: DC | PRN

## 2018-09-15 NOTE — Addendum Note (Signed)
Addended by: Michel Harrow on: 09/15/2018 08:23 AM   Modules accepted: Orders

## 2018-09-16 LAB — GC/CHLAMYDIA PROBE AMP (~~LOC~~) NOT AT ARMC
Chlamydia: NEGATIVE
Neisseria Gonorrhea: NEGATIVE

## 2018-09-17 LAB — HEMOGLOBIN A1C
Est. average glucose Bld gHb Est-mCnc: 117 mg/dL
Hgb A1c MFr Bld: 5.7 % — ABNORMAL HIGH (ref 4.8–5.6)

## 2018-09-17 LAB — OBSTETRIC PANEL, INCLUDING HIV
Basophils Absolute: 0.1 10*3/uL (ref 0.0–0.2)
Basos: 1 %
EOS (ABSOLUTE): 0.2 10*3/uL (ref 0.0–0.4)
Eos: 3 %
HIV Screen 4th Generation wRfx: NONREACTIVE
Hematocrit: 39 % (ref 34.0–46.6)
Hemoglobin: 12.2 g/dL (ref 11.1–15.9)
Hepatitis B Surface Ag: NEGATIVE
Immature Grans (Abs): 0 10*3/uL (ref 0.0–0.1)
Immature Granulocytes: 0 %
Lymphocytes Absolute: 2.5 10*3/uL (ref 0.7–3.1)
Lymphs: 36 %
MCH: 22.6 pg — ABNORMAL LOW (ref 26.6–33.0)
MCHC: 31.3 g/dL — ABNORMAL LOW (ref 31.5–35.7)
MCV: 72 fL — ABNORMAL LOW (ref 79–97)
Monocytes Absolute: 0.5 10*3/uL (ref 0.1–0.9)
Monocytes: 7 %
Neutrophils Absolute: 3.6 10*3/uL (ref 1.4–7.0)
Neutrophils: 53 %
Platelets: 306 10*3/uL (ref 150–450)
RBC: 5.4 x10E6/uL — ABNORMAL HIGH (ref 3.77–5.28)
RDW: 16.1 % — ABNORMAL HIGH (ref 11.7–15.4)
RPR Ser Ql: NONREACTIVE
Rh Factor: NEGATIVE
Rubella Antibodies, IGG: 1.54 index (ref >0.99)
WBC: 6.9 10*3/uL (ref 3.4–10.8)

## 2018-09-17 LAB — URINE CULTURE, OB REFLEX

## 2018-09-17 LAB — AB SCR+ANTIBODY ID: Antibody Screen: POSITIVE — AB

## 2018-09-17 LAB — CULTURE, OB URINE

## 2018-09-18 LAB — CYTOLOGY - PAP: Diagnosis: NEGATIVE

## 2018-09-20 ENCOUNTER — Telehealth: Payer: Self-pay | Admitting: Family Medicine

## 2018-09-20 NOTE — Telephone Encounter (Signed)
Spoke with patient to get her scheduled for her 2 hr gtt. PAtient was scheduled for 8/21 @ 8:50. Patient instructed to wear a face mask for the entire appointment and no visitors are allowed. Patient screened for covid symptoms and denied having any.

## 2018-09-22 ENCOUNTER — Other Ambulatory Visit: Payer: Medicaid Other

## 2018-09-22 ENCOUNTER — Encounter: Payer: Self-pay | Admitting: General Practice

## 2018-09-22 ENCOUNTER — Other Ambulatory Visit: Payer: Self-pay | Admitting: General Practice

## 2018-09-22 ENCOUNTER — Other Ambulatory Visit: Payer: Self-pay

## 2018-09-22 DIAGNOSIS — Z3490 Encounter for supervision of normal pregnancy, unspecified, unspecified trimester: Secondary | ICD-10-CM | POA: Diagnosis not present

## 2018-09-22 DIAGNOSIS — Z3493 Encounter for supervision of normal pregnancy, unspecified, third trimester: Secondary | ICD-10-CM

## 2018-09-22 NOTE — Progress Notes (Unsigned)
Patient stated she had not received her BP cuff yet from the pharmacy. [facility] and they stated there was an issue with the patient's insurance that they are going to look into and call the patient back shortly with an update. Patient informed.

## 2018-09-23 LAB — GLUCOSE TOLERANCE, 2 HOURS W/ 1HR
Glucose, 1 hour: 172 mg/dL (ref 65–179)
Glucose, 2 hour: 130 mg/dL (ref 65–152)
Glucose, Fasting: 80 mg/dL (ref 65–91)

## 2018-09-25 ENCOUNTER — Other Ambulatory Visit: Payer: Medicaid Other

## 2018-09-26 ENCOUNTER — Encounter: Payer: Self-pay | Admitting: General Practice

## 2018-09-27 ENCOUNTER — Encounter: Payer: Self-pay | Admitting: *Deleted

## 2018-09-28 ENCOUNTER — Telehealth (INDEPENDENT_AMBULATORY_CARE_PROVIDER_SITE_OTHER): Payer: Medicaid Other | Admitting: General Practice

## 2018-09-28 ENCOUNTER — Encounter: Payer: Self-pay | Admitting: General Practice

## 2018-09-28 DIAGNOSIS — Z3492 Encounter for supervision of normal pregnancy, unspecified, second trimester: Secondary | ICD-10-CM

## 2018-09-28 NOTE — Telephone Encounter (Signed)
Called patient and informed her of Horizon test results & provided phone number to schedule genetic counseling through [place]. Patient verbalized understanding & asked about other genetic test results. Informed her of low risk Panorama results. Patient verbalized understanding & had no questions.

## 2018-10-06 ENCOUNTER — Telehealth: Payer: Self-pay | Admitting: Lactation Services

## 2018-10-06 NOTE — Telephone Encounter (Signed)
Received call from [facility] to inform us that pt had BP alert.   BP readings were 140/92 with repeat of 139/89.   Attempted to call pt to ask about symptoms related to BP. LM for pt to call the office at her earliest convenience.

## 2018-10-10 ENCOUNTER — Other Ambulatory Visit: Payer: Self-pay | Admitting: Advanced Practice Midwife

## 2018-10-10 ENCOUNTER — Encounter: Payer: Self-pay | Admitting: *Deleted

## 2018-10-10 DIAGNOSIS — Z3A01 Less than 8 weeks gestation of pregnancy: Secondary | ICD-10-CM

## 2018-10-12 ENCOUNTER — Telehealth: Payer: Medicaid Other | Admitting: Obstetrics and Gynecology

## 2018-10-13 ENCOUNTER — Encounter: Payer: Self-pay | Admitting: *Deleted

## 2018-10-19 ENCOUNTER — Telehealth (INDEPENDENT_AMBULATORY_CARE_PROVIDER_SITE_OTHER): Payer: Medicaid Other | Admitting: Obstetrics and Gynecology

## 2018-10-19 ENCOUNTER — Other Ambulatory Visit: Payer: Self-pay

## 2018-10-19 DIAGNOSIS — Z3492 Encounter for supervision of normal pregnancy, unspecified, second trimester: Secondary | ICD-10-CM

## 2018-10-19 DIAGNOSIS — O99210 Obesity complicating pregnancy, unspecified trimester: Secondary | ICD-10-CM

## 2018-10-19 DIAGNOSIS — Z3A16 16 weeks gestation of pregnancy: Secondary | ICD-10-CM

## 2018-10-19 DIAGNOSIS — Z8659 Personal history of other mental and behavioral disorders: Secondary | ICD-10-CM

## 2018-10-19 DIAGNOSIS — O99212 Obesity complicating pregnancy, second trimester: Secondary | ICD-10-CM | POA: Diagnosis not present

## 2018-10-19 MED ORDER — ASPIRIN EC 81 MG PO TBEC: 81.0000 mg | DELAYED_RELEASE_TABLET | Freq: Every day | ORAL | 1 refills | Status: DC

## 2018-10-19 NOTE — Progress Notes (Signed)
   TELEHEALTH VIRTUAL OBSTETRICS VISIT ENCOUNTER NOTE  I connected with Katrina Stewart on 10/19/18 at  2:15 PM EDT by telephone at home and verified that I am speaking with the correct person using two identifiers.   I discussed the limitations, risks, security and privacy concerns of performing an evaluation and management service by telephone and the availability of in person appointments. I also discussed with the patient that there may be a patient responsible charge related to this service. The patient expressed understanding and agreed to proceed.  Subjective:  Katrina Stewart is a 23 y.o. G3P1011 at [redacted]w[redacted]d being followed for ongoing prenatal care.  She is currently monitored for the following issues for this low-risk pregnancy and has Alcohol use disorder; Severe episode of recurrent major depressive disorder, without psychotic features ([place]); Rh negative state in antepartum period; Rubella non-immune status, antepartum; Supervision of low-risk pregnancy; History of depression; and Maternal obesity affecting pregnancy, antepartum on their problem list.  Patient reports no complaints. Reports fetal movement. Denies any contractions, bleeding or leaking of fluid.   The following portions of the patient's history were reviewed and updated as appropriate: allergies, current medications, past family history, past medical history, past social history, past surgical history and problem list.   Objective:   General:  Alert, oriented and cooperative.   Mental Status: Normal mood and affect perceived. Normal judgment and thought content.  Rest of physical exam deferred due to type of encounter  Assessment and Plan:  Pregnancy: G3P1011 at [redacted]w[redacted]d 1. Maternal obesity affecting pregnancy, antepartum  BASA- start today.   2. Encounter for supervision of low-risk pregnancy in second trimester  Bp 138/88  3. History of depression  Doing well.  Preterm labor symptoms and general obstetric  precautions including but not limited to vaginal bleeding, contractions, leaking of fluid and fetal movement were reviewed in detail with the patient.  I discussed the assessment and treatment plan with the patient. The patient was provided an opportunity to ask questions and all were answered. The patient agreed with the plan and demonstrated an understanding of the instructions. The patient was advised to call back or seek an in-person office evaluation/go to [facility] for any urgent or concerning symptoms. Please refer to After Visit Summary for other counseling recommendations.   I provided 12 minutes of non-face-to-face time during this encounter.  Return in about 4 weeks (around 11/16/2018), or virtual ok.  Future Appointments  Date Time Provider [place]  11/03/2018  1:30 PM [facility]-MFC [place] 1 WH-MFCUS MFC-US     , NP [facility], [place]

## 2018-11-03 ENCOUNTER — Other Ambulatory Visit: Payer: Self-pay

## 2018-11-03 ENCOUNTER — Ambulatory Visit (HOSPITAL_COMMUNITY)
Admission: RE | Admit: 2018-11-03 | Discharge: 2018-11-03 | Disposition: A | Discharge status: Home / Self Care | Payer: Medicaid Other | Source: Ambulatory Visit | Attending: Obstetrics and Gynecology | Admitting: Obstetrics and Gynecology

## 2018-11-03 ENCOUNTER — Other Ambulatory Visit: Payer: Self-pay | Admitting: Obstetrics and Gynecology

## 2018-11-03 ENCOUNTER — Other Ambulatory Visit (HOSPITAL_COMMUNITY): Payer: Self-pay | Admitting: *Deleted

## 2018-11-03 DIAGNOSIS — Z148 Genetic carrier of other disease: Secondary | ICD-10-CM | POA: Diagnosis not present

## 2018-11-03 DIAGNOSIS — O99210 Obesity complicating pregnancy, unspecified trimester: Secondary | ICD-10-CM

## 2018-11-03 DIAGNOSIS — Z3A19 19 weeks gestation of pregnancy: Secondary | ICD-10-CM

## 2018-11-03 DIAGNOSIS — O99312 Alcohol use complicating pregnancy, second trimester: Secondary | ICD-10-CM | POA: Diagnosis not present

## 2018-11-03 DIAGNOSIS — O99212 Obesity complicating pregnancy, second trimester: Secondary | ICD-10-CM | POA: Diagnosis not present

## 2018-11-03 DIAGNOSIS — Z3491 Encounter for supervision of normal pregnancy, unspecified, first trimester: Secondary | ICD-10-CM | POA: Insufficient documentation

## 2018-11-03 DIAGNOSIS — O360120 Maternal care for anti-D [Rh] antibodies, second trimester, not applicable or unspecified: Secondary | ICD-10-CM

## 2018-11-13 ENCOUNTER — Other Ambulatory Visit: Payer: Self-pay

## 2018-11-13 DIAGNOSIS — Z3A01 Less than 8 weeks gestation of pregnancy: Secondary | ICD-10-CM

## 2018-11-13 MED ORDER — PROMETHAZINE HCL 25 MG PO TABS: ORAL_TABLET | ORAL | 0 refills | Status: DC

## 2018-11-13 NOTE — Progress Notes (Signed)
Pt request for refill on Phenergan.  Medication e-prescribed per standing protocol.    Katrina Stewart

## 2018-11-20 ENCOUNTER — Encounter (HOSPITAL_COMMUNITY): Payer: Self-pay

## 2018-11-20 ENCOUNTER — Telehealth: Payer: Self-pay | Admitting: Family Medicine

## 2018-11-20 ENCOUNTER — Other Ambulatory Visit: Payer: Self-pay

## 2018-11-20 ENCOUNTER — Inpatient Hospital Stay (HOSPITAL_COMMUNITY)
Admission: AD | Admit: 2018-11-20 | Discharge: 2018-11-20 | Disposition: A | Discharge status: Home / Self Care | Payer: Medicaid Other | Attending: Obstetrics & Gynecology | Admitting: Obstetrics & Gynecology

## 2018-11-20 DIAGNOSIS — R519 Headache, unspecified: Secondary | ICD-10-CM | POA: Diagnosis not present

## 2018-11-20 DIAGNOSIS — O10912 Unspecified pre-existing hypertension complicating pregnancy, second trimester: Secondary | ICD-10-CM

## 2018-11-20 DIAGNOSIS — Z7982 Long term (current) use of aspirin: Secondary | ICD-10-CM | POA: Diagnosis not present

## 2018-11-20 DIAGNOSIS — Z3A21 21 weeks gestation of pregnancy: Secondary | ICD-10-CM | POA: Diagnosis not present

## 2018-11-20 DIAGNOSIS — O10919 Unspecified pre-existing hypertension complicating pregnancy, unspecified trimester: Secondary | ICD-10-CM

## 2018-11-20 DIAGNOSIS — O26892 Other specified pregnancy related conditions, second trimester: Secondary | ICD-10-CM | POA: Insufficient documentation

## 2018-11-20 DIAGNOSIS — O10012 Pre-existing essential hypertension complicating pregnancy, second trimester: Secondary | ICD-10-CM | POA: Insufficient documentation

## 2018-11-20 HISTORY — DX: Unspecified pre-existing hypertension complicating pregnancy, unspecified trimester: O10.919

## 2018-11-20 LAB — COMPREHENSIVE METABOLIC PANEL
ALT: 11 U/L (ref 0–44)
AST: 38 U/L (ref 15–41)
Albumin: 3.3 g/dL — ABNORMAL LOW (ref 3.5–5.0)
Alkaline Phosphatase: 51 U/L (ref 38–126)
Anion gap: 10 (ref 5–15)
BUN: 6 mg/dL (ref 6–20)
CO2: 17 mmol/L — ABNORMAL LOW (ref 22–32)
Calcium: 9.4 mg/dL (ref 8.9–10.3)
Chloride: 107 mmol/L (ref 98–111)
Creatinine, Ser: 0.62 mg/dL (ref 0.44–1.00)
GFR calc Af Amer: >60 mL/min (ref >60)
GFR calc non Af Amer: >60 mL/min (ref >60)
Glucose, Bld: 85 mg/dL (ref 70–99)
Potassium: 5.1 mmol/L (ref 3.5–5.1)
Sodium: 134 mmol/L — ABNORMAL LOW (ref 135–145)
Total Bilirubin: 1.8 mg/dL — ABNORMAL HIGH (ref 0.3–1.2)
Total Protein: 6.2 g/dL — ABNORMAL LOW (ref 6.5–8.1)

## 2018-11-20 LAB — URINALYSIS, ROUTINE W REFLEX MICROSCOPIC
Bilirubin Urine: NEGATIVE
Glucose, UA: NEGATIVE mg/dL
Ketones, ur: 20 mg/dL — AB
Nitrite: NEGATIVE
Protein, ur: NEGATIVE mg/dL
Specific Gravity, Urine: 1.017 (ref 1.005–1.030)
pH: 6 (ref 5.0–8.0)

## 2018-11-20 LAB — CBC
HCT: 35 % — ABNORMAL LOW (ref 36.0–46.0)
Hemoglobin: 11.3 g/dL — ABNORMAL LOW (ref 12.0–15.0)
MCH: 23.5 pg — ABNORMAL LOW (ref 26.0–34.0)
MCHC: 32.3 g/dL (ref 30.0–36.0)
MCV: 72.8 fL — ABNORMAL LOW (ref 80.0–100.0)
Platelets: 315 10*3/uL (ref 150–400)
RBC: 4.81 MIL/uL (ref 3.87–5.11)
RDW: 15.9 % — ABNORMAL HIGH (ref 11.5–15.5)
WBC: 8.1 10*3/uL (ref 4.0–10.5)
nRBC: 0 % (ref 0.0–0.2)

## 2018-11-20 LAB — PROTEIN / CREATININE RATIO, URINE
Creatinine, Urine: 163.71 mg/dL
Total Protein, Urine: <6 mg/dL

## 2018-11-20 MED ORDER — DEXAMETHASONE SODIUM PHOSPHATE 10 MG/ML IJ SOLN
10.0000 mg | Freq: Once | INTRAMUSCULAR | Status: AC
  Administered 2018-11-20: 09:00:00 10 mg via INTRAVENOUS
  Filled 2018-11-20: qty 1

## 2018-11-20 MED ORDER — METOCLOPRAMIDE HCL 5 MG/ML IJ SOLN
10.0000 mg | Freq: Once | INTRAMUSCULAR | Status: AC
  Administered 2018-11-20: 10 mg via INTRAVENOUS
  Filled 2018-11-20: qty 2

## 2018-11-20 MED ORDER — DIPHENHYDRAMINE HCL 50 MG/ML IJ SOLN
25.0000 mg | Freq: Once | INTRAMUSCULAR | Status: AC
  Administered 2018-11-20: 09:00:00 25 mg via INTRAVENOUS
  Filled 2018-11-20: qty 1

## 2018-11-20 MED ORDER — SODIUM CHLORIDE 0.9 % IV SOLN
INTRAVENOUS | Status: DC
  Administered 2018-11-20: 09:00:00 via INTRAVENOUS

## 2018-11-20 MED ORDER — METOCLOPRAMIDE HCL 10 MG PO TABS: 10.0000 mg | ORAL_TABLET | Freq: Three times a day (TID) | ORAL | 0 refills | Status: DC | PRN

## 2018-11-20 NOTE — Telephone Encounter (Signed)
Attempted to call patient with her follow-up OB appointment (11/4/ @ 11:15). No answer, left voicemail with appointment information. Reminder mailed

## 2018-11-20 NOTE — Discharge Instructions (Signed)
Hypertension During Pregnancy °High blood pressure (hypertension) is when the force of blood pumping through the arteries is too strong. Arteries are blood vessels that carry blood from the heart throughout the body. Hypertension during pregnancy can be mild or severe. Severe hypertension during pregnancy (preeclampsia) is a medical emergency that requires prompt evaluation and treatment. °Different types of hypertension can happen during pregnancy. These include: °· Chronic hypertension. This happens when you had high blood pressure before you became pregnant, and it continues during the pregnancy. Hypertension that develops before you are [redacted] weeks pregnant and continues during the pregnancy is also called chronic hypertension. If you have chronic hypertension, it will not go away after you have your baby. You will need follow-up visits with your health care provider after you have your baby. Your doctor may want you to keep taking medicine for your blood pressure. °· Gestational hypertension. This is hypertension that develops after the 20th week of pregnancy. Gestational hypertension usually goes away after you have your baby, but your health care provider will need to monitor your blood pressure to make sure that it is getting better. °· Preeclampsia. This is severe hypertension during pregnancy. This can cause serious complications for you and your baby and can also cause complications for you after the delivery of your baby. °· Postpartum preeclampsia. You may develop severe hypertension after giving birth. This usually occurs within 48 hours after childbirth but may occur up to 6 weeks after giving birth. This is rare. °How does this affect me? °Women who have hypertension during pregnancy have a greater chance of developing hypertension later in life or during future pregnancies. In some cases, hypertension during pregnancy can cause serious complications, such as: °· Stroke. °· Heart attack. °· Injury to  other organs, such as kidneys, lungs, or liver. °· Preeclampsia. °· Convulsions or seizures. °· Placental abruption. °How does this affect my baby? °Hypertension during pregnancy can affect your baby. Your baby may: °· Be born early (prematurely). °· Not weigh as much as he or she should at birth (low birth weight). °· Not tolerate labor well, leading to an unplanned cesarean delivery. °What are the risks? °There are certain factors that make it more likely for you to develop hypertension during pregnancy. These include: °· Having hypertension during a previous pregnancy. °· Being overweight. °· Being age 35 or older. °· Being pregnant for the first time. °· Being pregnant with more than one baby. °· Becoming pregnant using fertilization methods, such as IVF (in vitro fertilization). °· Having other medical problems, such as diabetes, kidney disease, or lupus. °· Having a family history of hypertension. °What can I do to lower my risk? °The exact cause of hypertension during pregnancy is not known. You may be able to lower your risk by: °· Maintaining a healthy weight. °· Eating a healthy and balanced diet. °· Following your health care provider's instructions about treating any long-term conditions that you had before becoming pregnant. °It is very important to keep all of your prenatal care appointments. Your health care provider will check your blood pressure and make sure that your pregnancy is progressing as expected. If a problem is found, early treatment can prevent complications. °How is this treated? °Treatment for hypertension during pregnancy varies depending on the type of hypertension you have and how serious it is. °· If you were taking medicine for high blood pressure before you became pregnant, talk with your health care provider. You may need to change medicine during pregnancy because   some medicines, like ACE inhibitors, may not be considered safe for your baby. °· If you have gestational  hypertension, your health care provider may order medicine to treat this during pregnancy. °· If you are at risk for preeclampsia, your health care provider may recommend that you take a low-dose aspirin during your pregnancy. °· If you have severe hypertension, you may need to be hospitalized so you and your baby can be monitored closely. You may also need to be given medicine to lower your blood pressure. This medicine may be given by mouth or through an IV. °· In some cases, if your condition gets worse, you may need to deliver your baby early. °Follow these instructions at home: °Eating and drinking ° °· Drink enough fluid to keep your urine pale yellow. °· Avoid caffeine. °Lifestyle °· Do not use any products that contain nicotine or tobacco, such as cigarettes, e-cigarettes, and chewing tobacco. If you need help quitting, ask your health care provider. °· Do not use alcohol or drugs. °· Avoid stress as much as possible. °· Rest and get plenty of sleep. °· Regular exercise can help to reduce your blood pressure. Ask your health care provider what kinds of exercise are best for you. °General instructions °· Take over-the-counter and prescription medicines only as told by your health care provider. °· Keep all prenatal and follow-up visits as told by your health care provider. This is important. °Contact a health care provider if: °· You have symptoms that your health care provider told you may require more treatment or monitoring, such as: °? Headaches. °? Nausea or vomiting. °? Abdominal pain. °? Dizziness. °? Light-headedness. °Get help right away if: °· You have: °? Severe abdominal pain that does not get better with treatment. °? A severe headache that does not get better. °? Vomiting that does not get better. °? Sudden, rapid weight gain. °? Sudden swelling in your hands, ankles, or face. °? Vaginal bleeding. °? Blood in your urine. °? Blurred or double vision. °? Shortness of breath or chest  pain. °? Weakness on one side of your body. °? Difficulty speaking. °· Your baby is not moving as much as usual. °Summary °· High blood pressure (hypertension) is when the force of blood pumping through the arteries is too strong. °· Hypertension during pregnancy can cause problems for you and your baby. °· Treatment for hypertension during pregnancy varies depending on the type of hypertension you have and how serious it is. °· Keep all prenatal and follow-up visits as told by your health care provider. This is important. °This information is not intended to replace advice given to you by your health care provider. Make sure you discuss any questions you have with your health care provider. °Document Released: 10/06/2010 Document Revised: 05/11/2018 Document Reviewed: 02/14/2018 °Elsevier Patient Education © 2020 [employer] ° °

## 2018-11-20 NOTE — MAU Provider Note (Signed)
Chief Complaint: Headache and Emesis   First Provider Initiated Contact with Patient 11/20/18 [phone]     SUBJECTIVE HPI: Katrina Stewart is a 23 y.o. G3P1011 at 7w3dwho presents to [facility] Admissions reporting headache & n/v. Symptoms began 2-3 days ago with gradual onset of headache. Reports frontal headache that she describes as throbbing & aching. Rates pain 8/10. Endorses photophobia & n/v. Has been taking 2 tylenol at a time but unsure if RS or ES; no relief with tylenol. States she hasn't been able to keep down food in the last 2 days d/t the n/v. Denies hx of headaches or migraines. Denies fever/chills, sore throat, diarrhea, abdominal pain, vaginal bleeding.    Past Medical History:  Diagnosis Date  . Anxiety   . Depression   . Vaginal Pap smear, abnormal    OB History  Gravida Para Term Preterm AB Living  3 1 1   1 1   SAB TAB Ectopic Multiple Live Births  1 0   0 1    # Outcome Date GA Lbr Len/2nd Weight Sex Delivery Anes PTL Lv  3 Current           2 Term 02/22/16 [id number]:20 / 00:26 3440 g M Vag-Spont EPI  LIV  1 SAB            Past Surgical History:  Procedure Laterality Date  . BREAST SURGERY  2013   Lt & Rt excision juvenile fibroadenoma  . MASS EXCISION  10/29/2011   Procedure: EXCISION MASS;  Surgeon: DoHarl BowieMD;  Location: [facility];  Service: General;  Laterality: Bilateral;  excision of bilateral breast masses   Social History   Socioeconomic History  . Marital status: Single    Spouse name: Not on file  . Number of children: Not on file  . Years of education: Not on file  . Highest education level: Not on file  Occupational History    Comment: unemployed  Social Needs  . Financial resource strain: Not on file  . Food insecurity    Worry: Never true    Inability: Never true  . Transportation needs    Medical: Yes    Non-medical: Yes  Tobacco Use  . Smoking status: Never Smoker  . Smokeless tobacco: Never Used  Substance and Sexual  Activity  . Alcohol use: Not Currently    Alcohol/week: 2.0 - 3.0 standard drinks    Types: 2 - 3 Shots of liquor per week    Frequency: Never    Comment: every weekend before pregnancy (4-5 drinks)  . Drug use: No  . Sexual activity: Not Currently    Birth control/protection: None  Lifestyle  . Physical activity    Days per week: Not on file    Minutes per session: Not on file  . Stress: Not on file  Relationships  . Social coHerbalistn phone: Not on file    Gets together: Not on file    Attends religious service: Not on file    Active member of club or organization: Not on file    Attends meetings of clubs or organizations: Not on file    Relationship status: Not on file  . Intimate partner violence    Fear of current or ex partner: No    Emotionally abused: No    Physically abused: No    Forced sexual activity: No  Other Topics Concern  . Not on file  Social History Narrative  .  Not on file   Family History  Problem Relation Age of Onset  . Cancer Maternal Grandmother        lung cancer  . Cancer Other        bladder cancer  . Asthma Brother    No current facility-administered medications on file prior to encounter.    Current Outpatient Medications on File Prior to Encounter  Medication Sig Dispense Refill  . acetaminophen (TYLENOL) 500 MG tablet Take 1,000 mg by mouth every 6 (six) hours as needed for moderate pain.    Marland Kitchen aspirin EC 81 MG tablet Take 1 tablet (81 mg total) by mouth daily. 90 tablet 1  . Blood Pressure Monitoring (BLOOD PRESSURE KIT) DEVI 1 Device by Does not apply route as needed. ICD 10: Z39.90 1 Device 0  . prenatal vitamin w/FE, FA (PRENATAL 1 + 1) 27-1 MG TABS tablet Take 1 tablet by mouth daily at 12 noon. 30 tablet 11  . promethazine (PHENERGAN) 25 MG tablet TAKE 1 TABLET BY MOUTH EVERY 6 HOURS AS NEEDED FOR NAUSEA FOR VOMITING 30 tablet 0  . sertraline (ZOLOFT) 50 MG tablet Take 1 tablet (50 mg total) by mouth daily. 30 tablet 6    No Known Allergies  I have reviewed patient's Past Medical Hx, Surgical Hx, Family Hx, Social Hx, medications and allergies.   Review of Systems  Constitutional: Negative.   HENT: Negative for sore throat.   Eyes: Positive for photophobia. Negative for visual disturbance.  Respiratory: Negative.   Cardiovascular: Negative.   Gastrointestinal: Positive for nausea and vomiting. Negative for abdominal pain, constipation and diarrhea.  Genitourinary: Negative.   Neurological: Positive for headaches.    OBJECTIVE Patient Vitals for the past 24 hrs:  BP Temp Pulse Resp SpO2  11/20/18 1042 114/67 - - - -  11/20/18 0941 121/72 - 92 - -  11/20/18 0921 116/60 - 97 - -  11/20/18 0910 113/73 - 93 - -  11/20/18 0816 140/80 - (!) 119 - -  11/20/18 0815 140/80 98.7 F (37.1 C) 95 18 96 %   Constitutional: Well-developed, well-nourished female in no acute distress.  Cardiovascular: normal rate & rhythm, no murmur Respiratory: normal rate and effort. Lung sounds clear throughout GI: Abd soft, non-tender, Pos BS x 4. No guarding or rebound tenderness MS: Extremities nontender, no edema, normal ROM Neurologic: Alert and oriented x 4.      LAB RESULTS Results for orders placed or performed during the hospital encounter of 11/20/18 (from the past 24 hour(s))  Urinalysis, Routine w reflex microscopic     Status: Abnormal   Collection Time: 11/20/18  8:23 AM  Result Value Ref Range   Color, Urine YELLOW YELLOW   APPearance HAZY (A) CLEAR   Specific Gravity, Urine 1.017 1.005 - 1.030   pH 6.0 5.0 - 8.0   Glucose, UA NEGATIVE NEGATIVE mg/dL   Hgb urine dipstick SMALL (A) NEGATIVE   Bilirubin Urine NEGATIVE NEGATIVE   Ketones, ur 20 (A) NEGATIVE mg/dL   Protein, ur NEGATIVE NEGATIVE mg/dL   Nitrite NEGATIVE NEGATIVE   Leukocytes,Ua TRACE (A) NEGATIVE   RBC / HPF 0-5 0 - 5 RBC/hpf   WBC, UA 0-5 0 - 5 WBC/hpf   Bacteria, UA RARE (A) NONE SEEN   Squamous Epithelial / LPF 11-20 0 - 5    Mucus PRESENT    Amorphous Crystal PRESENT   Protein / creatinine ratio, urine     Status: None   Collection Time: 11/20/18  8:23 AM  Result Value Ref Range   Creatinine, Urine 163.71 mg/dL   Total Protein, Urine <6 mg/dL   Protein Creatinine Ratio        0.00 - 0.15 mg/mg[Cre]  CBC     Status: Abnormal   Collection Time: 11/20/18  8:40 AM  Result Value Ref Range   WBC 8.1 4.0 - 10.5 K/uL   RBC 4.81 3.87 - 5.11 MIL/uL   Hemoglobin 11.3 (L) 12.0 - 15.0 g/dL   HCT 35.0 (L) 36.0 - 46.0 %   MCV 72.8 (L) 80.0 - 100.0 fL   MCH 23.5 (L) 26.0 - 34.0 pg   MCHC 32.3 30.0 - 36.0 g/dL   RDW 15.9 (H) 11.5 - 15.5 %   Platelets 315 150 - 400 K/uL   nRBC 0.0 0.0 - 0.2 %  Comprehensive metabolic panel     Status: Abnormal   Collection Time: 11/20/18  8:40 AM  Result Value Ref Range   Sodium 134 (L) 135 - 145 mmol/L   Potassium 5.1 3.5 - 5.1 mmol/L   Chloride 107 98 - 111 mmol/L   CO2 17 (L) 22 - 32 mmol/L   Glucose, Bld 85 70 - 99 mg/dL   BUN 6 6 - 20 mg/dL   Creatinine, Ser 0.62 0.44 - 1.00 mg/dL   Calcium 9.4 8.9 - 10.3 mg/dL   Total Protein 6.2 (L) 6.5 - 8.1 g/dL   Albumin 3.3 (L) 3.5 - 5.0 g/dL   AST 38 15 - 41 U/L   ALT 11 0 - 44 U/L   Alkaline Phosphatase 51 38 - 126 U/L   Total Bilirubin 1.8 (H) 0.3 - 1.2 mg/dL   GFR calc non Af Amer >60 >60 mL/min   GFR calc Af Amer >60 >60 mL/min   Anion gap 10 5 - 15    IMAGING No results found.  MAU COURSE Orders Placed This Encounter  Procedures  . Urinalysis, Routine w reflex microscopic  . CBC  . Comprehensive metabolic panel  . Protein / creatinine ratio, urine  . Insert peripheral IV  . Discharge patient   Meds ordered this encounter  Medications  . AND Linked Order Group   . 0.9 %  sodium chloride infusion   . diphenhydrAMINE (BENADRYL) injection 25 mg   . metoCLOPramide (REGLAN) injection 10 mg   . dexamethasone (DECADRON) injection 10 mg  . metoCLOPramide (REGLAN) 10 MG tablet    Sig: Take 1 tablet (10 mg total) by  mouth every 8 (eight) hours as needed for nausea (or headache (to be taken with tylenol)).    Dispense:  30 tablet    Refill:  0    Order Specific Question:   Supervising Provider    Answer:   Verita Schneiders A [[id number]]    MDM FHT present via doppler  Elevated BP. Not severe range. Pt has elevated BPs during an MAU visit in June; none other since then. Also developed GHTN at the end of her last pregnancy. ?CHTN. She is >20 wks now so will get PEC labs.  She's on BASA d/t obesity.   IV headache cocktail (decadron, benadryl, reglan). Patient reports improvement in headache down to 3/10 (from 8/10).  No further elevated BPs & PEC labs normal  Reviewed with Dr. Harolyn Rutherford. Will add chronic hypertension to problem list as patient had elevated BPs prior to [redacted] wks gestation. She is already on baby ASA. Has a BP cuff at home. Msg sent to office for f/u appt in  person. Problem list updated to supervision of high risk pregnancy.   ASSESSMENT 1. Pregnancy headache in second trimester   2. [redacted] weeks gestation of pregnancy   3. Chronic hypertension complicating or reason for care during pregnancy, second trimester     PLAN Discharge home in stable condition. PTL & hypertension precautions Rx reglan  Msg to [place] for in person HROB visit on 10/30 (day of her MFM appt)  Follow-up Information    [facility] Follow up.   Specialty: Obstetrics and Gynecology Why: return for worsening symptoms Contact information: [address] [phone] [place] [phone]         Allergies as of 11/20/2018   No Known Allergies     Medication List    TAKE these medications   acetaminophen 500 MG tablet Commonly known as: TYLENOL Take 1,000 mg by mouth every 6 (six) hours as needed for moderate pain.   aspirin EC 81 MG tablet Take 1 tablet (81 mg total) by mouth daily. What changed: Another medication with the same name was removed.  Continue taking this medication, and follow the directions you see here.   Blood Pressure Kit Devi 1 Device by Does not apply route as needed. ICD 10: Z39.90   metoCLOPramide 10 MG tablet Commonly known as: REGLAN Take 1 tablet (10 mg total) by mouth every 8 (eight) hours as needed for nausea (or headache (to be taken with tylenol)).   prenatal vitamin w/FE, FA 27-1 MG Tabs tablet Take 1 tablet by mouth daily at 12 noon.   promethazine 25 MG tablet Commonly known as: PHENERGAN TAKE 1 TABLET BY MOUTH EVERY 6 HOURS AS NEEDED FOR NAUSEA FOR VOMITING   sertraline 50 MG tablet Commonly known as: Zoloft Take 1 tablet (50 mg total) by mouth daily.        Jorje Guild, NP 11/20/2018  10:58 AM

## 2018-11-20 NOTE — MAU Note (Signed)
Pt presents to MAU with c/o vomiting and headaches x 3 days. She is also complaining of left arm pain, no known injury. Pt denies VB and LOF.

## 2018-12-01 ENCOUNTER — Other Ambulatory Visit: Payer: Self-pay

## 2018-12-01 ENCOUNTER — Ambulatory Visit (HOSPITAL_COMMUNITY)
Admission: RE | Admit: 2018-12-01 | Discharge: 2018-12-01 | Disposition: A | Discharge status: Home / Self Care | Payer: Medicaid Other | Source: Ambulatory Visit | Attending: Obstetrics and Gynecology | Admitting: Obstetrics and Gynecology

## 2018-12-01 ENCOUNTER — Encounter (HOSPITAL_COMMUNITY): Payer: Self-pay | Admitting: *Deleted

## 2018-12-01 ENCOUNTER — Other Ambulatory Visit (HOSPITAL_COMMUNITY): Payer: Self-pay | Admitting: *Deleted

## 2018-12-01 ENCOUNTER — Ambulatory Visit (HOSPITAL_COMMUNITY): Payer: Medicaid Other | Admitting: *Deleted

## 2018-12-01 DIAGNOSIS — O10912 Unspecified pre-existing hypertension complicating pregnancy, second trimester: Secondary | ICD-10-CM

## 2018-12-01 DIAGNOSIS — Z362 Encounter for other antenatal screening follow-up: Secondary | ICD-10-CM | POA: Diagnosis not present

## 2018-12-01 DIAGNOSIS — O0990 Supervision of high risk pregnancy, unspecified, unspecified trimester: Secondary | ICD-10-CM | POA: Insufficient documentation

## 2018-12-01 DIAGNOSIS — O99212 Obesity complicating pregnancy, second trimester: Secondary | ICD-10-CM

## 2018-12-01 DIAGNOSIS — O99312 Alcohol use complicating pregnancy, second trimester: Secondary | ICD-10-CM

## 2018-12-01 DIAGNOSIS — O99210 Obesity complicating pregnancy, unspecified trimester: Secondary | ICD-10-CM | POA: Insufficient documentation

## 2018-12-01 DIAGNOSIS — Z8659 Personal history of other mental and behavioral disorders: Secondary | ICD-10-CM | POA: Insufficient documentation

## 2018-12-01 DIAGNOSIS — O360120 Maternal care for anti-D [Rh] antibodies, second trimester, not applicable or unspecified: Secondary | ICD-10-CM | POA: Diagnosis not present

## 2018-12-01 DIAGNOSIS — O10012 Pre-existing essential hypertension complicating pregnancy, second trimester: Secondary | ICD-10-CM

## 2018-12-01 DIAGNOSIS — O10919 Unspecified pre-existing hypertension complicating pregnancy, unspecified trimester: Secondary | ICD-10-CM

## 2018-12-01 DIAGNOSIS — Z3A23 23 weeks gestation of pregnancy: Secondary | ICD-10-CM | POA: Diagnosis not present

## 2018-12-03 ENCOUNTER — Encounter (HOSPITAL_COMMUNITY): Payer: Self-pay

## 2018-12-03 ENCOUNTER — Inpatient Hospital Stay (HOSPITAL_COMMUNITY)
Admission: AD | Admit: 2018-12-03 | Discharge: 2018-12-03 | Disposition: A | Discharge status: Home / Self Care | Payer: Medicaid Other | Attending: Obstetrics & Gynecology | Admitting: Obstetrics & Gynecology

## 2018-12-03 ENCOUNTER — Other Ambulatory Visit: Payer: Self-pay

## 2018-12-03 DIAGNOSIS — Z8052 Family history of malignant neoplasm of bladder: Secondary | ICD-10-CM | POA: Insufficient documentation

## 2018-12-03 DIAGNOSIS — Z79899 Other long term (current) drug therapy: Secondary | ICD-10-CM | POA: Diagnosis not present

## 2018-12-03 DIAGNOSIS — O2692 Pregnancy related conditions, unspecified, second trimester: Secondary | ICD-10-CM

## 2018-12-03 DIAGNOSIS — O99342 Other mental disorders complicating pregnancy, second trimester: Secondary | ICD-10-CM | POA: Insufficient documentation

## 2018-12-03 DIAGNOSIS — R509 Fever, unspecified: Secondary | ICD-10-CM | POA: Diagnosis not present

## 2018-12-03 DIAGNOSIS — Z825 Family history of asthma and other chronic lower respiratory diseases: Secondary | ICD-10-CM | POA: Insufficient documentation

## 2018-12-03 DIAGNOSIS — Z3A23 23 weeks gestation of pregnancy: Secondary | ICD-10-CM

## 2018-12-03 DIAGNOSIS — O368320 Maternal care for abnormalities of the fetal heart rate or rhythm, second trimester, not applicable or unspecified: Secondary | ICD-10-CM

## 2018-12-03 DIAGNOSIS — F329 Major depressive disorder, single episode, unspecified: Secondary | ICD-10-CM | POA: Diagnosis not present

## 2018-12-03 DIAGNOSIS — F419 Anxiety disorder, unspecified: Secondary | ICD-10-CM | POA: Insufficient documentation

## 2018-12-03 DIAGNOSIS — Z801 Family history of malignant neoplasm of trachea, bronchus and lung: Secondary | ICD-10-CM | POA: Diagnosis not present

## 2018-12-03 DIAGNOSIS — U071 COVID-19: Secondary | ICD-10-CM

## 2018-12-03 DIAGNOSIS — O210 Mild hyperemesis gravidarum: Secondary | ICD-10-CM

## 2018-12-03 DIAGNOSIS — O98512 Other viral diseases complicating pregnancy, second trimester: Secondary | ICD-10-CM | POA: Diagnosis present

## 2018-12-03 DIAGNOSIS — Z3A01 Less than 8 weeks gestation of pregnancy: Secondary | ICD-10-CM

## 2018-12-03 LAB — CBC
HCT: 34.1 % — ABNORMAL LOW (ref 36.0–46.0)
Hemoglobin: 10.8 g/dL — ABNORMAL LOW (ref 12.0–15.0)
MCH: 22.8 pg — ABNORMAL LOW (ref 26.0–34.0)
MCHC: 31.7 g/dL (ref 30.0–36.0)
MCV: 72.1 fL — ABNORMAL LOW (ref 80.0–100.0)
Platelets: 265 10*3/uL (ref 150–400)
RBC: 4.73 MIL/uL (ref 3.87–5.11)
RDW: 15.9 % — ABNORMAL HIGH (ref 11.5–15.5)
WBC: 9.8 10*3/uL (ref 4.0–10.5)
nRBC: 0 % (ref 0.0–0.2)

## 2018-12-03 LAB — URINALYSIS, ROUTINE W REFLEX MICROSCOPIC
Bilirubin Urine: NEGATIVE
Glucose, UA: NEGATIVE mg/dL
Ketones, ur: 20 mg/dL — AB
Leukocytes,Ua: NEGATIVE
Nitrite: NEGATIVE
Protein, ur: NEGATIVE mg/dL
Specific Gravity, Urine: 1.016 (ref 1.005–1.030)
pH: 5 (ref 5.0–8.0)

## 2018-12-03 LAB — GROUP A STREP BY PCR: Group A Strep by PCR: NOT DETECTED

## 2018-12-03 LAB — INFLUENZA PANEL BY PCR (TYPE A & B)
Influenza A By PCR: NEGATIVE
Influenza B By PCR: NEGATIVE

## 2018-12-03 MED ORDER — ACETAMINOPHEN 325 MG PO TABS
650.0000 mg | ORAL_TABLET | Freq: Once | ORAL | Status: AC
  Administered 2018-12-03: 650 mg via ORAL
  Filled 2018-12-03: qty 2

## 2018-12-03 MED ORDER — DM-GUAIFENESIN ER 30-600 MG PO TB12
1.0000 | ORAL_TABLET | Freq: Two times a day (BID) | ORAL | Status: DC
  Filled 2018-12-03: qty 1

## 2018-12-03 MED ORDER — ONDANSETRON 4 MG PO TBDP
8.0000 mg | ORAL_TABLET | Freq: Once | ORAL | Status: AC
  Administered 2018-12-03: 18:00:00 8 mg via ORAL
  Filled 2018-12-03: qty 2

## 2018-12-03 MED ORDER — LACTATED RINGERS IV BOLUS
1000.0000 mL | Freq: Once | INTRAVENOUS | Status: AC
  Administered 2018-12-03: 1000 mL via INTRAVENOUS

## 2018-12-03 MED ORDER — PROMETHAZINE HCL 25 MG PO TABS: ORAL_TABLET | ORAL | 0 refills | Status: DC

## 2018-12-03 MED ORDER — PROMETHAZINE HCL 25 MG/ML IJ SOLN
25.0000 mg | Freq: Once | INTRAMUSCULAR | Status: AC
  Administered 2018-12-03: 25 mg via INTRAVENOUS
  Filled 2018-12-03: qty 1

## 2018-12-03 MED ORDER — DM-GUAIFENESIN ER 30-600 MG PO TB12
1.0000 | ORAL_TABLET | Freq: Once | ORAL | Status: AC
  Administered 2018-12-03: 1 via ORAL
  Filled 2018-12-03: qty 1

## 2018-12-03 NOTE — MAU Note (Signed)
Pt fluids almost done, stated she is feeling better, she is attempting another PO challenge, Barrington Ellison ,MD notified.

## 2018-12-03 NOTE — MAU Note (Addendum)
Pt reports to mau with c/o nasal congestion, sore throat, and headache since waking up this morning.  Pt also reports body aches and sob.  Pt denies vag bleeding or lof.  Pt reports that she hasn't felt baby move as much as normal.  FHR 165 during triage. Pt also reports emesis X4 today. Reports cough since Friday.

## 2018-12-03 NOTE — Discharge Instructions (Signed)
Prevent the Spread of COVID-19 if You Are Sick If you are sick with COVID-19 or think you might have COVID-19, follow the steps below to help protect other people in your home and community. Stay home except to get medical care.  Stay home. Most people with COVID-19 have mild illness and are able to recover at home without medical care. Do not leave your home, except to get medical care. Do not visit public areas.  Take care of yourself. Get rest and stay hydrated.  Get medical care when needed. Call your doctor before you go to their office for care. But, if you have trouble breathing or other concerning symptoms, call 911 for immediate help.  Avoid public transportation, ride-sharing, or taxis. Separate yourself from other people and pets in your home.  As much as possible, stay in a specific room and away from other people and pets in your home. Also, you should use a separate bathroom, if available. If you need to be around other people or animals in or outside of the home, wear a cloth face covering. ? See COVID-19 and Animals if you have questions about pets: https://www.cdc.gov/coronavirus/2019-ncov/faq.html#COVID19animals Monitor your symptoms.  Common symptoms of COVID-19 include fever and cough. Trouble breathing is a more serious symptom that means you should get medical attention.  Follow care instructions from your healthcare provider and local health department. Your local health authorities will give instructions on checking your symptoms and reporting information. If you develop emergency warning signs for COVID-19 get medical attention immediately.  Emergency warning signs include*:  Trouble breathing  Persistent pain or pressure in the chest  New confusion or not able to be woken  Bluish lips or face *This list is not all inclusive. Please consult your medical provider for any other symptoms that are severe or concerning to you. Call 911 if you have a medical  emergency. If you have a medical emergency and need to call 911, notify the operator that you have or think you might have, COVID-19. If possible, put on a facemask before medical help arrives. Call ahead before visiting your doctor.  Call ahead. Many medical visits for routine care are being postponed or done by phone or telemedicine.  If you have a medical appointment that cannot be postponed, call your doctor's office. This will help the office protect themselves and other patients. If you are sick, wear a cloth covering over your nose and mouth.  You should wear a cloth face covering over your nose and mouth if you must be around other people or animals, including pets (even at home).  You don't need to wear the cloth face covering if you are alone. If you can't put on a cloth face covering (because of trouble breathing for example), cover your coughs and sneezes in some other way. Try to stay at least 6 feet away from other people. This will help protect the people around you. Note: During the COVID-19 pandemic, medical grade facemasks are reserved for healthcare workers and some first responders. You may need to make a cloth face covering using a scarf or bandana. Cover your coughs and sneezes.  Cover your mouth and nose with a tissue when you cough or sneeze.  Throw used tissues in a lined trash can.  Immediately wash your hands with soap and water for at least 20 seconds. If soap and water are not available, clean your hands with an alcohol-based hand sanitizer that contains at least 60% alcohol. Clean your hands often.    Wash your hands often with soap and water for at least 20 seconds. This is especially important after blowing your nose, coughing, or sneezing; going to the bathroom; and before eating or preparing food.  Use hand sanitizer if soap and water are not available. Use an alcohol-based hand sanitizer with at least 60% alcohol, covering all surfaces of your hands and rubbing  them together until they feel dry.  Soap and water are the best option, especially if your hands are visibly dirty.  Avoid touching your eyes, nose, and mouth with unwashed hands. Avoid sharing personal household items.  Do not share dishes, drinking glasses, cups, eating utensils, towels, or bedding with other people in your home.  Wash these items thoroughly after using them with soap and water or put them in the dishwasher. Clean all "high-touch" surfaces everyday.  Clean and disinfect high-touch surfaces in your "sick room" and bathroom. Let someone else clean and disinfect surfaces in common areas, but not your bedroom and bathroom.  If a caregiver or other person needs to clean and disinfect a sick person's bedroom or bathroom, they should do so on an as-needed basis. The caregiver/other person should wear a mask and wait as long as possible after the sick person has used the bathroom. High-touch surfaces include phones, remote controls, counters, tabletops, doorknobs, bathroom fixtures, toilets, keyboards, tablets, and bedside tables.  Clean and disinfect areas that may have blood, stool, or body fluids on them.  Use household cleaners and disinfectants. Clean the area or item with soap and water or another detergent if it is dirty. Then use a household disinfectant. ? Be sure to follow the instructions on the label to ensure safe and effective use of the product. Many products recommend keeping the surface wet for several minutes to ensure germs are killed. Many also recommend precautions such as wearing gloves and making sure you have good ventilation during use of the product. ? Most EPA-registered household disinfectants should be effective. How to discontinue home isolation  People with COVID-19 who have stayed home (home isolated) can stop home isolation under the following conditions: ? If you will not have a test to determine if you are still contagious, you can leave home  after these three things have happened:  You have had no fever for at least 72 hours (that is three full days of no fever without the use of medicine that reduces fevers) AND  other symptoms have improved (for example, when your cough or shortness of breath has improved) AND  at least 10 days have passed since your symptoms first appeared. ? If you will be tested to determine if you are still contagious, you can leave home after these three things have happened:  You no longer have a fever (without the use of medicine that reduces fevers) AND  other symptoms have improved (for example, when your cough or shortness of breath has improved) AND  you received two negative tests in a row, 24 hours apart. Your doctor will follow CDC guidelines. In all cases, follow the guidance of your healthcare provider and local health department. The decision to stop home isolation should be made in consultation with your healthcare provider and state and local health departments. Local decisions depend on local circumstances. cdc.gov/coronavirus 06/04/2018 This information is not intended to replace advice given to you by your health care provider. Make sure you discuss any questions you have with your health care provider. Document Released: 05/16/2018 Document Revised: 06/14/2018 Document Reviewed: 05/16/2018   Elsevier Patient Education  2020 [employer]  

## 2018-12-03 NOTE — Progress Notes (Signed)
Crackers and ginger ale given.

## 2018-12-03 NOTE — MAU Provider Note (Addendum)
Patient Katrina Stewart is a 23 y.o. G3P1011 At 60w2dhere with complaints of sore throat, runny nose, cough, body aches, and headaches and nausea/vomting. She denies VB, LOF, dysuria, contractions, decreased fetal movement. She endorses some pelvic pain, but not bad enough to come to the emergency room.   She has not been exposed to anyone with COVID-19; her son is at home with her. Her brother and mom work outside the house.   She was seen on the 19th for nausea and vomiting; she went home with Reglan, which had been working until it stopped working yesterday.  History     CSN: [id number] Arrival date and time: 12/03/18 1621   First Provider Initiated Contact with Patient 12/03/18 1730      Chief Complaint  Patient presents with  . Nausea  . Emesis  . Sore Throat  . Nasal Congestion   Emesis  The problem occurs 2 to 4 times per day (she also threw up 3-4 times today). The emesis has an appearance of stomach contents. Associated symptoms include coughing. Pertinent negatives include no diarrhea.  Sore Throat  This is a new problem. The current episode started yesterday. The maximum temperature recorded prior to her arrival was 100.4 - 100.9 F (100.2 at home). Associated symptoms include congestion, coughing and vomiting. Pertinent negatives include no diarrhea, ear pain or shortness of breath. She has had no exposure to strep.   She didn't try to eat anything, instead she was trying to take her nausea medicine and keep her prenatal and aspirin down.  OB History    Gravida  3   Para  1   Term  1   Preterm      AB  1   Living  1     SAB  1   TAB  0   Ectopic      Multiple  0   Live Births  1           Past Medical History:  Diagnosis Date  . Anxiety   . Depression   . Vaginal Pap smear, abnormal     Past Surgical History:  Procedure Laterality Date  . BREAST SURGERY  2013   Lt & Rt excision juvenile fibroadenoma  . MASS EXCISION  10/29/2011    Procedure: EXCISION MASS;  Surgeon: DHarl Bowie MD;  Location: [facility];  Service: General;  Laterality: Bilateral;  excision of bilateral breast masses    Family History  Problem Relation Age of Onset  . Cancer Maternal Grandmother        lung cancer  . Cancer Other        bladder cancer  . Asthma Brother     Social History   Tobacco Use  . Smoking status: Never Smoker  . Smokeless tobacco: Never Used  Substance Use Topics  . Alcohol use: Not Currently    Alcohol/week: 2.0 - 3.0 standard drinks    Types: 2 - 3 Shots of liquor per week    Frequency: Never    Comment: every weekend before pregnancy (4-5 drinks)  . Drug use: No    Allergies: No Known Allergies  Medications Prior to Admission  Medication Sig Dispense Refill Last Dose  . acetaminophen (TYLENOL) 500 MG tablet Take 1,000 mg by mouth every 6 (six) hours as needed for moderate pain.   12/02/2018 at Unknown time  . aspirin EC 81 MG tablet Take 1 tablet (81 mg total) by mouth daily. 90 tablet 1  12/02/2018 at Unknown time  . Blood Pressure Monitoring (BLOOD PRESSURE KIT) DEVI 1 Device by Does not apply route as needed. ICD 10: Z39.90 1 Device 0   . metoCLOPramide (REGLAN) 10 MG tablet Take 1 tablet (10 mg total) by mouth every 8 (eight) hours as needed for nausea (or headache (to be taken with tylenol)). 30 tablet 0   . prenatal vitamin w/FE, FA (PRENATAL 1 + 1) 27-1 MG TABS tablet Take 1 tablet by mouth daily at 12 noon. 30 tablet 11   . promethazine (PHENERGAN) 25 MG tablet TAKE 1 TABLET BY MOUTH EVERY 6 HOURS AS NEEDED FOR NAUSEA FOR VOMITING 30 tablet 0   . sertraline (ZOLOFT) 50 MG tablet Take 1 tablet (50 mg total) by mouth daily. 30 tablet 6     Review of Systems  Constitutional: Negative.   HENT: Positive for congestion. Negative for ear pain.   Respiratory: Positive for cough. Negative for shortness of breath and wheezing.   Cardiovascular: Negative.   Gastrointestinal: Positive for vomiting. Negative  for diarrhea.  Psychiatric/Behavioral: Negative.    Physical Exam   Blood pressure 133/73, pulse (!) 118, temperature 99.9 F (37.7 C), temperature source Oral, resp. rate 18, last menstrual period 06/23/2018.  Physical Exam  Constitutional: She is oriented to person, place, and time. She appears well-developed.  HENT:  Head: Normocephalic.  Cardiovascular: Normal rate.  Respiratory: Effort normal and breath sounds normal. No respiratory distress. She has no wheezes. She has no rales.  GI: Soft. She exhibits no distension. There is no abdominal tenderness.  Musculoskeletal: Normal range of motion.  Neurological: She is alert and oriented to person, place, and time.  Skin: Skin is warm and dry.  Negative CVA tenderness; abdomen is soft and non-tender. Lungs are clear to auscultation bilaterally, no crackles, rales or wheezes. Patient able to inhale and exhale without exertion or difficulty.   MAU Course  Procedures  MDM -will do CBC, flu, strep tests and COVID (Hologic).  -try mucinex, tylenol, Zofran and see if patient can tolerate PO -UA shows ketones and Hemoglobin, will send for culture.   -1733: Patient feels better, CBC unremarkable; Flu and Strep negative -NST: 165 bpm, mod var, present acel, no decels,   Patient is well-appearing, no SOB, abdomen is soft and non-tender, no CVA tenderness, no fever.   1949: Will try PO challenge now.  2000: patient vomited her PO challenge, will try IV fluids.   Assessment and Plan    Patient care endorsed to Dr. Marice Potter, on-coming provider.    -Urine culture pending -Covid test pending (Hologic). Explained to patient that she will need to self-isolate at home until her results come back; if she tests positive she will need to further quarantine. Explained that her immediate family will also need to quarantine until her test results are back.   Mervyn Skeeters Kooistra 12/03/2018, 5:38 PM  Assumed care of patient at 2000. Patient  failed PO challenge. IV placed and patient given 1 L LR and IV Phenergan. Patient felt much better. Tachycardia improved and FHR no longer tachycardic as well. Patient felt comfortable discharging home. Phenergan refilled on discharge. Possibly COVID, but also another viral illness. Supportive care. [facility] CNM will f/u COVID testing. Discharged home with return precautions.  Barrington Ellison, MD [facility] Fellow, [facility] for [employer], [place]

## 2018-12-03 NOTE — MAU Note (Signed)
Pt was given PO challenge and she reported that the ginger ale did not stay down Barrington Ellison MD notified.

## 2018-12-04 LAB — SARS CORONAVIRUS 2 (TAT 6-24 HRS): SARS Coronavirus 2: POSITIVE — AB

## 2018-12-05 ENCOUNTER — Telehealth: Payer: Self-pay | Admitting: Family Medicine

## 2018-12-05 ENCOUNTER — Other Ambulatory Visit: Payer: Self-pay | Admitting: Student

## 2018-12-05 DIAGNOSIS — U071 COVID-19: Secondary | ICD-10-CM

## 2018-12-05 HISTORY — DX: COVID-19: U07.1

## 2018-12-05 LAB — CULTURE, OB URINE: Culture: >=100000 — AB

## 2018-12-05 MED ORDER — ONDANSETRON 8 MG PO TBDP: 8.0000 mg | ORAL_TABLET | Freq: Three times a day (TID) | ORAL | 0 refills | Status: DC | PRN

## 2018-12-05 NOTE — Telephone Encounter (Signed)
Called and left patient a Detailed message about her upcoming appointment being changed to a mychart visit.

## 2018-12-06 ENCOUNTER — Other Ambulatory Visit: Payer: Self-pay

## 2018-12-06 ENCOUNTER — Telehealth (INDEPENDENT_AMBULATORY_CARE_PROVIDER_SITE_OTHER): Payer: Medicaid Other | Admitting: Family Medicine

## 2018-12-06 ENCOUNTER — Telehealth: Payer: Self-pay | Admitting: Medical

## 2018-12-06 DIAGNOSIS — U071 COVID-19: Secondary | ICD-10-CM

## 2018-12-06 DIAGNOSIS — Z3A23 23 weeks gestation of pregnancy: Secondary | ICD-10-CM

## 2018-12-06 DIAGNOSIS — O10912 Unspecified pre-existing hypertension complicating pregnancy, second trimester: Secondary | ICD-10-CM

## 2018-12-06 DIAGNOSIS — O99342 Other mental disorders complicating pregnancy, second trimester: Secondary | ICD-10-CM

## 2018-12-06 DIAGNOSIS — B952 Enterococcus as the cause of diseases classified elsewhere: Secondary | ICD-10-CM

## 2018-12-06 DIAGNOSIS — O0990 Supervision of high risk pregnancy, unspecified, unspecified trimester: Secondary | ICD-10-CM

## 2018-12-06 DIAGNOSIS — O99212 Obesity complicating pregnancy, second trimester: Secondary | ICD-10-CM

## 2018-12-06 DIAGNOSIS — O99210 Obesity complicating pregnancy, unspecified trimester: Secondary | ICD-10-CM

## 2018-12-06 DIAGNOSIS — F332 Major depressive disorder, recurrent severe without psychotic features: Secondary | ICD-10-CM

## 2018-12-06 DIAGNOSIS — O0992 Supervision of high risk pregnancy, unspecified, second trimester: Secondary | ICD-10-CM

## 2018-12-06 DIAGNOSIS — O98512 Other viral diseases complicating pregnancy, second trimester: Secondary | ICD-10-CM

## 2018-12-06 MED ORDER — CEPHALEXIN 500 MG PO CAPS: 500.0000 mg | ORAL_CAPSULE | Freq: Four times a day (QID) | ORAL | 0 refills | Status: DC

## 2018-12-06 NOTE — Telephone Encounter (Signed)
Called Katrina Stewart and confirmed identity. Patient given results of + urine culture and need for treatment. Rx for Keflex sent to patient's pharmacy of choice. Confirmed NKDA. Patient advised to increase PO hydration. Warning signs for worsening condition discussed. Patient voiced understanding.   Luvenia Redden, PA-C 12/06/2018 9:11 AM

## 2018-12-06 NOTE — Progress Notes (Signed)
I connected with  [name] on 12/06/18 at 11:15 AM EST by telephone and verified that I am speaking with the correct person using two identifiers.   I discussed the limitations, risks, security and privacy concerns of performing an evaluation and management service by telephone and the availability of in person appointments. I also discussed with the patient that there may be a patient responsible charge related to this service. The patient expressed understanding and agreed to proceed.  [place], [place] 12/06/2018  11:06 AM

## 2018-12-07 ENCOUNTER — Telehealth: Payer: Self-pay

## 2018-12-07 ENCOUNTER — Encounter: Payer: Self-pay | Admitting: Student

## 2018-12-07 ENCOUNTER — Other Ambulatory Visit: Payer: Self-pay

## 2018-12-07 ENCOUNTER — Inpatient Hospital Stay (HOSPITAL_COMMUNITY)
Admission: EM | Admit: 2018-12-07 | Discharge: 2018-12-12 | DRG: 831 | Disposition: A | Discharge status: Home / Self Care | Payer: Medicaid Other | Attending: Family Medicine | Admitting: Family Medicine

## 2018-12-07 ENCOUNTER — Emergency Department (HOSPITAL_COMMUNITY): Payer: Medicaid Other

## 2018-12-07 ENCOUNTER — Encounter: Payer: Self-pay | Admitting: Family Medicine

## 2018-12-07 ENCOUNTER — Encounter (INDEPENDENT_AMBULATORY_CARE_PROVIDER_SITE_OTHER): Payer: Self-pay

## 2018-12-07 ENCOUNTER — Encounter (HOSPITAL_COMMUNITY): Payer: Self-pay | Admitting: Emergency Medicine

## 2018-12-07 DIAGNOSIS — R0602 Shortness of breath: Secondary | ICD-10-CM | POA: Diagnosis not present

## 2018-12-07 DIAGNOSIS — O98512 Other viral diseases complicating pregnancy, second trimester: Principal | ICD-10-CM | POA: Diagnosis present

## 2018-12-07 DIAGNOSIS — O99352 Diseases of the nervous system complicating pregnancy, second trimester: Secondary | ICD-10-CM | POA: Diagnosis present

## 2018-12-07 DIAGNOSIS — G4700 Insomnia, unspecified: Secondary | ICD-10-CM | POA: Diagnosis present

## 2018-12-07 DIAGNOSIS — O99512 Diseases of the respiratory system complicating pregnancy, second trimester: Secondary | ICD-10-CM | POA: Diagnosis present

## 2018-12-07 DIAGNOSIS — O2342 Unspecified infection of urinary tract in pregnancy, second trimester: Secondary | ICD-10-CM | POA: Diagnosis present

## 2018-12-07 DIAGNOSIS — O99412 Diseases of the circulatory system complicating pregnancy, second trimester: Secondary | ICD-10-CM | POA: Diagnosis present

## 2018-12-07 DIAGNOSIS — O99342 Other mental disorders complicating pregnancy, second trimester: Secondary | ICD-10-CM | POA: Diagnosis present

## 2018-12-07 DIAGNOSIS — U071 COVID-19: Secondary | ICD-10-CM | POA: Diagnosis present

## 2018-12-07 DIAGNOSIS — O212 Late vomiting of pregnancy: Secondary | ICD-10-CM | POA: Diagnosis present

## 2018-12-07 DIAGNOSIS — Z6791 Unspecified blood type, Rh negative: Secondary | ICD-10-CM

## 2018-12-07 DIAGNOSIS — J9601 Acute respiratory failure with hypoxia: Secondary | ICD-10-CM | POA: Diagnosis present

## 2018-12-07 DIAGNOSIS — J1289 Other viral pneumonia: Secondary | ICD-10-CM | POA: Diagnosis present

## 2018-12-07 DIAGNOSIS — I517 Cardiomegaly: Secondary | ICD-10-CM | POA: Diagnosis present

## 2018-12-07 DIAGNOSIS — Z148 Genetic carrier of other disease: Secondary | ICD-10-CM

## 2018-12-07 DIAGNOSIS — F329 Major depressive disorder, single episode, unspecified: Secondary | ICD-10-CM | POA: Diagnosis present

## 2018-12-07 DIAGNOSIS — O10012 Pre-existing essential hypertension complicating pregnancy, second trimester: Secondary | ICD-10-CM | POA: Diagnosis present

## 2018-12-07 DIAGNOSIS — O26892 Other specified pregnancy related conditions, second trimester: Secondary | ICD-10-CM | POA: Diagnosis present

## 2018-12-07 DIAGNOSIS — O26899 Other specified pregnancy related conditions, unspecified trimester: Secondary | ICD-10-CM

## 2018-12-07 DIAGNOSIS — N390 Urinary tract infection, site not specified: Secondary | ICD-10-CM | POA: Insufficient documentation

## 2018-12-07 DIAGNOSIS — O99212 Obesity complicating pregnancy, second trimester: Secondary | ICD-10-CM | POA: Diagnosis present

## 2018-12-07 DIAGNOSIS — Z3A24 24 weeks gestation of pregnancy: Secondary | ICD-10-CM

## 2018-12-07 DIAGNOSIS — O10912 Unspecified pre-existing hypertension complicating pregnancy, second trimester: Secondary | ICD-10-CM | POA: Diagnosis present

## 2018-12-07 DIAGNOSIS — E669 Obesity, unspecified: Secondary | ICD-10-CM | POA: Diagnosis present

## 2018-12-07 DIAGNOSIS — B9620 Unspecified Escherichia coli [E. coli] as the cause of diseases classified elsewhere: Secondary | ICD-10-CM | POA: Diagnosis present

## 2018-12-07 DIAGNOSIS — R918 Other nonspecific abnormal finding of lung field: Secondary | ICD-10-CM | POA: Diagnosis not present

## 2018-12-07 DIAGNOSIS — O10919 Unspecified pre-existing hypertension complicating pregnancy, unspecified trimester: Secondary | ICD-10-CM | POA: Diagnosis present

## 2018-12-07 LAB — BASIC METABOLIC PANEL
Anion gap: 13 (ref 5–15)
BUN: 5 mg/dL — ABNORMAL LOW (ref 6–20)
CO2: 18 mmol/L — ABNORMAL LOW (ref 22–32)
Calcium: 9.1 mg/dL (ref 8.9–10.3)
Chloride: 102 mmol/L (ref 98–111)
Creatinine, Ser: 0.78 mg/dL (ref 0.44–1.00)
GFR calc Af Amer: >60 mL/min (ref >60)
GFR calc non Af Amer: >60 mL/min (ref >60)
Glucose, Bld: 104 mg/dL — ABNORMAL HIGH (ref 70–99)
Potassium: 3.1 mmol/L — ABNORMAL LOW (ref 3.5–5.1)
Sodium: 133 mmol/L — ABNORMAL LOW (ref 135–145)

## 2018-12-07 LAB — CBC WITH DIFFERENTIAL/PLATELET
Abs Immature Granulocytes: 0.13 10*3/uL — ABNORMAL HIGH (ref 0.00–0.07)
Basophils Absolute: 0 10*3/uL (ref 0.0–0.1)
Basophils Relative: 0 %
Eosinophils Absolute: 0 10*3/uL (ref 0.0–0.5)
Eosinophils Relative: 0 %
HCT: 35 % — ABNORMAL LOW (ref 36.0–46.0)
Hemoglobin: 11 g/dL — ABNORMAL LOW (ref 12.0–15.0)
Immature Granulocytes: 2 %
Lymphocytes Relative: 13 %
Lymphs Abs: 1 10*3/uL (ref 0.7–4.0)
MCH: 22.8 pg — ABNORMAL LOW (ref 26.0–34.0)
MCHC: 31.4 g/dL (ref 30.0–36.0)
MCV: 72.6 fL — ABNORMAL LOW (ref 80.0–100.0)
Monocytes Absolute: 0.7 10*3/uL (ref 0.1–1.0)
Monocytes Relative: 8 %
Neutro Abs: 6.3 10*3/uL (ref 1.7–7.7)
Neutrophils Relative %: 77 %
Platelets: 329 10*3/uL (ref 150–400)
RBC: 4.82 MIL/uL (ref 3.87–5.11)
RDW: 15.4 % (ref 11.5–15.5)
WBC: 8.1 10*3/uL (ref 4.0–10.5)
nRBC: 0 % (ref 0.0–0.2)

## 2018-12-07 LAB — TROPONIN I (HIGH SENSITIVITY): Troponin I (High Sensitivity): 5 ng/L (ref <18)

## 2018-12-07 MED ORDER — POTASSIUM CHLORIDE CRYS ER 20 MEQ PO TBCR
20.0000 meq | EXTENDED_RELEASE_TABLET | Freq: Once | ORAL | Status: AC
  Administered 2018-12-07: 23:00:00 20 meq via ORAL
  Filled 2018-12-07: qty 1

## 2018-12-07 MED ORDER — SODIUM CHLORIDE 0.9 % IV BOLUS
500.0000 mL | Freq: Once | INTRAVENOUS | Status: AC
  Administered 2018-12-07: 23:00:00 500 mL via INTRAVENOUS

## 2018-12-07 MED ORDER — DEXAMETHASONE SODIUM PHOSPHATE 10 MG/ML IJ SOLN
6.0000 mg | Freq: Once | INTRAMUSCULAR | Status: AC
  Administered 2018-12-08: 6 mg via INTRAVENOUS
  Filled 2018-12-07: qty 1

## 2018-12-07 MED ORDER — ACETAMINOPHEN 500 MG PO TABS
1000.0000 mg | ORAL_TABLET | Freq: Once | ORAL | Status: AC
  Administered 2018-12-07: 23:00:00 1000 mg via ORAL
  Filled 2018-12-07: qty 2

## 2018-12-07 NOTE — Telephone Encounter (Signed)
Patient was advised on vomiting per protocol:    If appetite becomes worse: encourage patient to drink fluids as tolerated, work their way up to bland solid food such as crackers, pretzels, soup, bread or applesauce and boiled starches.   If patient is unable to tolerate any foods or liquids, notify PCP.   IF PATIENT DEVELOPS SEVERE VOMITING (MORE THAN 6 TIMES A DAY AND OR >8 HOURS) AND/OR SEVERE ABDOMINAL PAIN ADVISE PATIENT TO CALL 911 AND SEEK TREATMENT IN ED  Patient states that she has vomited twice and she thinks it from coughing so much. Patient verbalized understanding and agrees with plan.

## 2018-12-07 NOTE — Progress Notes (Signed)
TELEHEALTH OBSTETRICS PRENATAL VIRTUAL VIDEO VISIT ENCOUNTER NOTE  Provider location: [facility] [place]   I connected with Katrina Stewart on 12/07/18 at 11:15 AM EST by MyChart Video Encounter at home and verified that I am speaking with the correct person using two identifiers.   I discussed the limitations, risks, security and privacy concerns of performing an evaluation and management service virtually and the availability of in person appointments. I also discussed with the patient that there may be a patient responsible charge related to this service. The patient expressed understanding and agreed to proceed. Subjective:  Katrina Stewart is a 23 y.o. G3P1011 at [redacted]w[redacted]d being seen today for ongoing prenatal care.  She is currently monitored for the following issues for this high-risk pregnancy and has Alcohol use disorder; Severe episode of recurrent major depressive disorder, without psychotic features ([place]); Rh negative state in antepartum period; Rubella non-immune status, antepartum; Supervision of high risk pregnancy, antepartum; History of depression; Maternal obesity affecting pregnancy, antepartum; Chronic hypertension complicating or reason for care during pregnancy, second trimester; COVID-19 affecting pregnancy in second trimester; and UTI (urinary tract infection) on their problem list.  Patient reports cough, denies SOB.  Contractions: Not present. Vag. Bleeding: None.  Movement: Present. Denies any leaking of fluid.   The following portions of the patient's history were reviewed and updated as appropriate: allergies, current medications, past family history, past medical history, past social history, past surgical history and problem list.   Objective:  There were no vitals filed for this visit.  Fetal Status:     Movement: Present     General:  Alert, oriented and cooperative. Patient is in no acute distress.  Respiratory: Normal respiratory effort, no  problems with respiration noted  Mental Status: Normal mood and affect. Normal behavior. Normal judgment and thought content.  Rest of physical exam deferred due to type of encounter  Imaging: [place] Mfm Ob Follow Up  Result Date: 12/01/2018 ----------------------------------------------------------------------  OBSTETRICS REPORT                       (Signed Final 12/01/2018 04:49 pm) ---------------------------------------------------------------------- Patient Info  ID #:       [id number]                          D.O.B.:  1995-06-10 (23 yrs)  Name:       Katrina Stewart                Visit Date: 12/01/2018 01:16 pm ---------------------------------------------------------------------- Performed By  Performed By:     Rodrigo Ran BS      Ref. Address:      Faculty                    RDMS RVT  Attending:        Sander Nephew      [facility]                    MD                                        [facility]  Referred By:      Vickii Chafe  CONSTANT MD ---------------------------------------------------------------------- Orders   #  Description                          Code         Ordered By   1  [place] MFM OB FOLLOW UP                  [id number]     Sander Nephew  ----------------------------------------------------------------------   #  Order #                    Accession #                 Episode #   1  [id number]                  [id number]                  [id number]  ---------------------------------------------------------------------- Indications   Encounter for other antenatal screening        Z36.2   follow-up (low risk NIPS)   Hypertension - Chronic/Pre-existing (ASA)      O10.019   Rh negative state in antepartum                [id number]   Obesity complicating pregnancy, second         O99.212   trimester (BMI 41)   Alcohol use complicating pregnancy, second     O99.312   trimester   Genetic carrier  (alpha-thalassemia)            Z14.8   [redacted] weeks gestation of pregnancy                Z3A.23  ---------------------------------------------------------------------- Vital Signs                                                 Height:        5'8" ---------------------------------------------------------------------- Fetal Evaluation  Num Of Fetuses:          1  Fetal Heart Rate(bpm):   155  Cardiac Activity:        Observed  Presentation:            Variable  Placenta:                Posterior  P. Cord Insertion:       Visualized  Amniotic Fluid  AFI FV:      Subjectively upper-normal                              Largest Pocket(cm)                              8.5 ---------------------------------------------------------------------- Biometry  BPD:        54  mm     G. Age:  22w 3d  24  %    CI:        72.02   %    70 - 86                                                          FL/HC:       19.4  %    19.2 - 20.8  HC:      202.5  mm     G. Age:  22w 3d         15  %    HC/AC:       1.13       1.05 - 1.21  AC:      179.1  mm     G. Age:  22w 5d         34  %    FL/BPD:      72.6  %    71 - 87  FL:       39.2  mm     G. Age:  22w 4d         26  %    FL/AC:       21.9  %    20 - 24  HUM:      36.1  mm     G. Age:  22w 4d         33  %  Est. FW:     523   gm     1 lb 2 oz     27  % ---------------------------------------------------------------------- OB History  Gravidity:    3         Term:   1         SAB:   1  Living:       1 ---------------------------------------------------------------------- Gestational Age  LMP:           23w 0d        Date:  06/23/18                 EDD:   03/30/19  U/S Today:     22w 4d                                        EDD:   04/02/19  Best:          23w 0d     Det. By:  LMP  (06/23/18)          EDD:   03/30/19 ---------------------------------------------------------------------- Anatomy  Cranium:               Appears normal         Aortic Arch:            Previously seen   Cavum:                 Appears normal         Ductal Arch:            Previously seen  Ventricles:            Appears normal         Diaphragm:  Appears normal  Choroid Plexus:        Previously seen        Stomach:                Appears normal, left                                                                        sided  Cerebellum:            Previously seen        Abdomen:                Appears normal  Posterior Fossa:       Previously seen        Abdominal Wall:         Appears nml (cord                                                                        insert, abd wall)  Nuchal Fold:           Previously seen        Cord Vessels:           Appears normal (3                                                                        vessel cord)  Face:                  Profile nl; orbits     Kidneys:                Appear normal                         prev visualized  Lips:                  Appears normal         Bladder:                Appears normal  Thoracic:              Appears normal         Spine:                  Previously seen  Heart:                 Appears normal         Upper Extremities:      Previously seen                         (4CH, axis, and  situs)  RVOT:                  Previously seen        Lower Extremities:      Appears normal  LVOT:                  Previously seen  Other:  Nasal bone visualized. Heels and 5th digit previously visualized. ---------------------------------------------------------------------- Cervix Uterus Adnexa  Cervix  Length:            4.5  cm.  Normal appearance by transabdominal scan.  Uterus  No abnormality visualized.  Left Ovary  Within normal limits.  Right Ovary  Within normal limits.  Cul De Sac  No free fluid seen.  Adnexa  No abnormality visualized. ---------------------------------------------------------------------- Impression  Normal interval growth.  Low risk NIPS  Chronic Hypertension on low dose ASA  BMI  41 ---------------------------------------------------------------------- Recommendations  Follow up growth in 4-6 weeks. ----------------------------------------------------------------------               Sander Nephew, MD Electronically Signed Final Report   12/01/2018 04:49 pm ----------------------------------------------------------------------   Assessment and Plan:  Pregnancy: [id number] at [redacted]w[redacted]d 1. COVID-19 - MyChart COVID-19 home monitoring program; Future - Temperature monitoring; Future  2. Chronic hypertension complicating or reason for care during pregnancy, second trimester BP is well controlled in BabyScripts continue ASA, no meds  3. Severe episode of recurrent major depressive disorder, without psychotic features ([place]) Continue Zoloft, thinks symptoms are ok  4. Supervision of high risk pregnancy, antepartum Continue prenatal care  5. Maternal obesity affecting pregnancy, antepartum   6. COVID-19 affecting pregnancy in second trimester   Preterm labor symptoms and general obstetric precautions including but not limited to vaginal bleeding, contractions, leaking of fluid and fetal movement were reviewed in detail with the patient. I discussed the assessment and treatment plan with the patient. The patient was provided an opportunity to ask questions and all were answered. The patient agreed with the plan and demonstrated an understanding of the instructions. The patient was advised to call back or seek an in-person office evaluation/go to [facility] at [facility] for any urgent or concerning symptoms. Please refer to After Visit Summary for other counseling recommendations.   I provided 7 minutes of face-to-face time during this encounter.  Return in about 2 weeks (around 12/20/2018) for virtual, [facility], needs MD.  Future Appointments  Date Time Provider [place]  12/22/2018  9:55 AM [name], MD [facility]  12/27/2018  4:15 PM [place]e Jude, MD [facility] [place]  01/02/2019  1:15 PM [place] NURSE [place] [facility]  01/02/2019  1:15 PM [place], [place] for [facility], [place]

## 2018-12-07 NOTE — ED Provider Notes (Signed)
[facility] EMERGENCY DEPARTMENT Provider Note   CSN: [id number] Arrival date & time: 12/07/18  2042     History   Chief Complaint Chief Complaint  Patient presents with  . COVID+ SOB    HPI Katrina Stewart is a 23 y.o. female G2P1 [redacted] weeks pregnant with no significant past medical history who presents to the ED due to shortness of breath that started today. Patient notes she started experiencing COVID symptoms last Friday and test positive for COVID on Monday. She admits to roughly 10 episodes of non-bloody, non-bilious emesis and diarrhea for the past week. She states she is unable to tolerate po fluids or any food. She has tried Zofran with minimal relief. Shortness of breath occurs both at rest and with exertion. Patient denies wheezing. No history of asthma. Patient admits to fever, chills, productive cough with clear phlegm, and a sore throat. She is still able to feel her baby move frequently and denies vaginal bleeding. Patient denies chest pain, abdominal pain, difficulties swallowing, and lower extremity edema.   Past Medical History:  Diagnosis Date  . Anxiety   . COVID-19 virus detected   . Depression   . Vaginal Pap smear, abnormal     Patient Active Problem List   Diagnosis Date Noted  . UTI (urinary tract infection) 12/07/2018  . COVID-19 affecting pregnancy in second trimester 12/05/2018  . Chronic hypertension complicating or reason for care during pregnancy, second trimester 11/20/2018  . Maternal obesity affecting pregnancy, antepartum 09/14/2018  . Supervision of high risk pregnancy, antepartum 08/29/2018  . History of depression 08/29/2018  . Rh negative state in antepartum period 09/03/2015  . Rubella non-immune status, antepartum 09/03/2015  . Severe episode of recurrent major depressive disorder, without psychotic features ([place])   . Alcohol use disorder     Past Surgical History:  Procedure Laterality Date  . BREAST SURGERY  2013   Lt & Rt excision juvenile fibroadenoma  . MASS EXCISION  10/29/2011   Procedure: EXCISION MASS;  Surgeon: Harl Bowie, MD;  Location: [facility];  Service: General;  Laterality: Bilateral;  excision of bilateral breast masses     OB History    Gravida  3   Para  1   Term  1   Preterm      AB  1   Living  1     SAB  1   TAB  0   Ectopic      Multiple  0   Live Births  1            Home Medications    Prior to Admission medications   Medication Sig Start Date End Date Taking? Authorizing Provider  acetaminophen (TYLENOL) 500 MG tablet Take 1,000 mg by mouth every 6 (six) hours as needed for moderate pain.    [provider]  aspirin EC 81 MG tablet Take 1 tablet (81 mg total) by mouth daily. 10/19/18   Rasch, Anderson Malta I, NP  Blood Pressure Monitoring (BLOOD PRESSURE KIT) DEVI 1 Device by Does not apply route as needed. ICD 10: Z39.90 09/15/18   Constant, Peggy, MD  metoCLOPramide (REGLAN) 10 MG tablet Take 1 tablet (10 mg total) by mouth every 8 (eight) hours as needed for nausea (or headache (to be taken with tylenol)). 11/20/18   Jorje Guild, NP  prenatal vitamin w/FE, FA (PRENATAL 1 + 1) 27-1 MG TABS tablet Take 1 tablet by mouth daily at 12 noon. 08/15/18   Rasch,  Artist Pais, NP  sertraline (ZOLOFT) 50 MG tablet Take 1 tablet (50 mg total) by mouth daily. 09/14/18   Constant, Peggy, MD    Family History Family History  Problem Relation Age of Onset  . Cancer Maternal Grandmother        lung cancer  . Cancer Other        bladder cancer  . Asthma Brother     Social History Social History   Tobacco Use  . Smoking status: Never Smoker  . Smokeless tobacco: Never Used  Substance Use Topics  . Alcohol use: Not Currently    Alcohol/week: 2.0 - 3.0 standard drinks    Types: 2 - 3 Shots of liquor per week    Frequency: Never    Comment: every weekend before pregnancy (4-5 drinks)  . Drug use: No     Allergies   Patient has no known  allergies.   Review of Systems Review of Systems  Constitutional: Positive for chills and fever.  HENT: Positive for congestion and sore throat. Negative for trouble swallowing.   Respiratory: Positive for cough and shortness of breath. Negative for wheezing.   Cardiovascular: Negative for chest pain, palpitations and leg swelling.  Gastrointestinal: Positive for diarrhea, nausea and vomiting. Negative for abdominal pain.  Genitourinary: Negative for dysuria, pelvic pain and vaginal bleeding.  Musculoskeletal: Positive for myalgias.     Physical Exam Updated Vital Signs BP 128/76 (BP Location: Left Arm)   Pulse (!) 129   Temp (!) 100.4 F (38 C) (Oral)   Resp 20   LMP 06/23/2018 (Exact Date)   SpO2 94%   Physical Exam Vitals signs and nursing note reviewed.  Constitutional:      General: She is not in acute distress.    Appearance: She is not toxic-appearing.  HENT:     Head: Normocephalic.     Mouth/Throat:     Mouth: Mucous membranes are moist.     Pharynx: Posterior oropharyngeal erythema present. No oropharyngeal exudate.     Comments: Uvula midline. No abscess noted. Tolerating oral secretions without difficulty.  Eyes:     Conjunctiva/sclera: Conjunctivae normal.  Neck:     Musculoskeletal: Normal range of motion and neck supple. No neck rigidity or muscular tenderness.  Cardiovascular:     Rate and Rhythm: Regular rhythm. Tachycardia present.     Pulses: Normal pulses.     Heart sounds: Normal heart sounds. No murmur. No friction rub. No gallop.   Pulmonary:     Effort: Pulmonary effort is normal.     Breath sounds: Normal breath sounds. No wheezing, rhonchi or rales.     Comments: Clear to auscultation bilaterally. Equal rise and fall of chest. Speaking in full sentences. No accessory muscle usage. Abdominal:     General: There is no distension.     Tenderness: There is no abdominal tenderness.     Comments: Patient is [redacted] weeks pregnant  Musculoskeletal:      Right lower leg: No edema.     Left lower leg: No edema.     Comments: Able to move all 4 extremities without difficulty. No lower extremity edema  Skin:    General: Skin is warm and dry.  Neurological:     General: No focal deficit present.     Mental Status: She is alert and oriented to person, place, and time.      ED Treatments / Results  Labs (all labs ordered are listed, but only abnormal results are displayed)  Labs Reviewed  CBC WITH DIFFERENTIAL/PLATELET - Abnormal; Notable for the following components:      Result Value   Hemoglobin 11.0 (*)    HCT 35.0 (*)    MCV 72.6 (*)    MCH 22.8 (*)    Abs Immature Granulocytes 0.13 (*)    All other components within normal limits  BASIC METABOLIC PANEL - Abnormal; Notable for the following components:   Sodium 133 (*)    Potassium 3.1 (*)    CO2 18 (*)    Glucose, Bld 104 (*)    BUN 5 (*)    All other components within normal limits  TROPONIN I (HIGH SENSITIVITY)    EKG EKG Interpretation  Date/Time:  Thursday December 07 2018 20:57:06 EST Ventricular Rate:  123 PR Interval:  164 QRS Duration: 86 QT Interval:  312 QTC Calculation: 446 R Axis:   103 Text Interpretation: Sinus tachycardia Possible Left atrial enlargement Rightward axis Low voltage QRS Nonspecific ST abnormality Abnormal ECG Since last tracing rate faster Confirmed by Dorie Rank [phone]) on 12/07/2018 9:17:27 PM   Radiology Dg Chest Portable 1 View  Result Date: 12/07/2018 CLINICAL DATA:  COVID-19 EXAM: PORTABLE CHEST 1 VIEW COMPARISON:  05/15/2014 FINDINGS: Mild cardiomegaly with central vascular congestion. Patchy airspace disease at the bases. No pneumothorax. IMPRESSION: 1. Cardiomegaly with vascular congestion 2. Patchy airspace disease at the bases may reflect atelectasis or pneumonia Electronically Signed   By: Donavan Foil M.D.   On: 12/07/2018 22:40    Procedures Procedures (including critical care time)  Medications Ordered in ED  Medications  dexamethasone (DECADRON) injection 6 mg (has no administration in time range)  acetaminophen (TYLENOL) tablet 1,000 mg (1,000 mg Oral Given 12/07/18 2305)  sodium chloride 0.9 % bolus 500 mL (500 mLs Intravenous New Bag/Given 12/07/18 2252)  potassium chloride SA (KLOR-CON) CR tablet 20 mEq (20 mEq Oral Given 12/07/18 2250)     Initial Impression / Assessment and Plan / ED Course  I have reviewed the triage vital signs and the nursing notes.  Pertinent labs & imaging results that were available during my care of the patient were reviewed by me and considered in my medical decision making (see chart for details).  Clinical Course as of Dec 06 2337  Thu Dec 07, 2018  2327 Spoke to Dr. Chauncey Reading who agrees to evaluate and admit patient    [CC]    Clinical Course User Index [CC] Jonette Eva, PA-C      23 year old female presents to the ED for shortness of breath secondary to COVID-19 infection. Upon arrival, patient is febrile and tachycardic at 129. Patient in no acute distress. Non-toxic appearing. Able to speak in full sentences. Suspect shortness of breath is related to COVID-19 infection. Lungs clear to auscultation bilaterally with no rales or wheeze. Will give Tylenol and IVFs.   BMP with hyponatremia at 133 and hypokalemia at 3.1. Will replete potassium. CBC with microcytic anemia which appears to be patient's baseline. No leukocytosis. EKG reviewed which demonstrates sinus tachycardia with nonspecific ST wave abnormalities. Will obtain troponin. CXR reviewed which demonstrates cardiomegaly and patchy airspace disease at bases suggestive of atelectasis vs. Pneumonia. Suspect COVID pneumonia.   Given patient's worsening shortness of breath with positive COVID test, will consult family practice to admit patient.    Katrina Stewart was evaluated in Emergency Department on 12/07/2018 for the symptoms described in the history of present illness. She was evaluated in the  context of the global  COVID-19 pandemic, which necessitated consideration that the patient might be at risk for infection with the SARS-CoV-2 virus that causes COVID-19. Institutional protocols and algorithms that pertain to the evaluation of patients at risk for COVID-19 are in a state of rapid change based on information released by regulatory bodies including the CDC and federal and state organizations. These policies and algorithms were followed during the patient's care in the ED.  Final Clinical Impressions(s) / ED Diagnoses   Final diagnoses:  COVID-19 virus infection  Shortness of breath  [redacted] weeks gestation of pregnancy    ED Discharge Orders    None       Romie Levee 12/07/18 2340    Dorie Rank, MD 12/08/18 [phone]

## 2018-12-07 NOTE — ED Triage Notes (Signed)
Patient reports SOB with occasional productive cough , body aches onset today , patient stated that she is COVID + and [redacted] weeks pregnant.

## 2018-12-07 NOTE — H&P (Addendum)
[facility] Admission History and Physical Service Pager: [phone]  Patient name: Katrina Stewart Medical record number: [id number] Date of birth: 07-Mar-1995 Age: 23 y.o. Gender: female  Primary Care Provider: Patient, No Pcp Per Consultants: OB Code Status: Full  Chief Complaint: SOB, unable to tolerate PO  Assessment and Plan: Katrina Stewart is a G3P1011 at [redacted]w[redacted]d 23y.o. female presenting with SOB, tachypnea, nausea, vomiting and unable to tolerate PO. PMH is significant for COVID-19 (+) affecting 2nd trimester pregnancy, Chronic HTN, MDD, Rh negative, RNI, supervision of high risk pregnancy, maternal obesity affecting pregnancy, Alpha Thalassemia carrier, h/o EtOH SUD, UTI.  Nausea  Vomiting  COVID-19 during second trimester of pregnancy Patient has had nausea and vomiting for 1 week, with four episodes today.  COVID-19 positive on 11/1.  She has not had much to eat in recent days, and has not been able to keep down any food today.  She was only able to tolerate a small amount of water in the last 24 hours, about 1 cup.  Patient does not appear overtly dehydrated, no chapped lips, normal skin turgor, cap refill less than 2 seconds.  CXR reveals cardiomegaly with vascular congestion, although may be less accurate since portable AP view.  She also does not appear volume overloaded, no distinguishable JVP or LE edema.  So far she has received 1500 cc bolus in the ED.   She is a young person without other risks for heart disease, on auscultation she has sinus tachycardia.  High-sensitivity troponin within normal limits at 5, will recheck x 1 according to ED chest pain algorithm, although ACS is highly unlikely.  Will hold off with maintenance IV fluids at this time since patient can keep down some fluids, day team can consider this as well as possible echocardiogram to better assess cardiomegaly.  BMP reveals hyponatremia and hypokalemia, likely from vomiting and  decreased p.o. intake.  Potassium repleted with K-Dur 40 mEq x1 in ED.  Patient endorses fetal movement, denies vaginal bleeding, loss of fluid, and cramping. -Admit to FPTS for observation, attending Dr. MArdelia Mems-Continuous cardiac monitoring and pulse oximetry -Ondansetron 4 mg IV every 6 as needed -Metoclopramide 10 mg p.o. every 8 as needed, continued from home  -Acetaminophen for pain -Prenatal MVI -Lovenox for DVT PPx -Diet regular, as tolerated -AM BMP, CBC, Mg, Phos -consult OB in a.m. for monitoring during pregnancy  COVID-19  SOB Patient began experiencing GI symptoms a week ago, and on Monday she tested positive for COVID-19.  Patient had no respiratory symptoms until earlier today where she felt short of breath and feverish.  On presentation to the ED, she is tachypneic to 30s, but has maintained O2 saturation in the high 90s and has not required oxygen.  On exam she appears tachypneic, but not in any distress, continues to maintain her saturations appropriately.  Lung exam revealed diminished air movement at bilateral bases. CXR reveals patchy airspace disease at the bases, which may suggest atelectasis or pneumonia. Pt received Decadron x1 in the ED, as patient does not need supplemental oxygen at this time, no need for further steroid dosing.  If her respiratory status deteriorates and she requires supplemental oxygen, can consider further glucocorticoid treatment. -Continuous pulse oximetry -Keep SpO2 >94%, can use supplemental O2 via [place] as needed  Chronic HTN Patient was recently diagnosed with chronic hypertension during this pregnancy.  She has been taking baby aspirin.  In the ED she has had some hypertension to 140s over  80s.  Most recent blood pressure normotensive of 117/65.  She has had some headaches recently but thinks this might to be due to dehydration and the virus, she has had no change in vision, no leg swelling. - continue bASA  Asymptomatic bacteriuria in  pregnancy 4 days ago patient was diagnosed with asymptomatic bacteriuria, E. Coli.  She was prescribed Keflex 500 mg x 5 days.  She started the prescription on 11/4, took all doses on that day, and 3 out of 4 doses yesterday. - Continue Keflex 500 mg QID while admitted (up to total of 5 days)   MDD Home medications include Zoloft 50 mg. -Continue home meds  FEN/GI: Regular diet as tolerated, reglan, phenergan Prophylaxis: lovenox  Disposition: to med-surg for observation until improved breathing status, able to tolerate PO  History of Present Illness:  Katrina Stewart is a 23 y.o. female presenting with shortness of breath, tachypnea, tachycardia, body aches, nausea and vomiting.  She has had GI symptoms present for the last week, on 11/2 she tested positive for Covid-19.  She has had nausea and vomiting for the last several days, she is unable to take keep down food and can only drink a little bit of water.  Today she had approximately 6 to 8 ounces of water was all she could tolerate.  She began feeling short of breath today, prompting her presentation to the emergency department.  4 days ago she was diagnosed with E. coli UTI.  She started Keflex on 11/4 for 5 days of treatment.  She was recently diagnosed with chronic hypertension, and is on baby aspirin.  She denies vision changes, lower extremity edema, she has had headaches, however she believes is due to dehydration and the virus.  She reports feeling baby move well, no contractions, no vaginal bleeding, no leakage of fluid.  Review Of Systems: Per HPI with the following additions:   Review of Systems  Constitutional: Positive for fever and malaise/fatigue. Negative for chills and weight loss.  HENT: Negative.   Eyes: Negative.   Respiratory: Positive for cough and shortness of breath. Negative for wheezing.   Cardiovascular: Negative.   Gastrointestinal: Positive for nausea and vomiting. Negative for abdominal pain and diarrhea.   Genitourinary: Negative.   Musculoskeletal: Positive for joint pain.  Skin: Negative.   Neurological: Positive for headaches.  Endo/Heme/Allergies: Negative.   Psychiatric/Behavioral: Negative.     Patient Active Problem List   Diagnosis Date Noted  . UTI (urinary tract infection) 12/07/2018  . COVID-19 affecting pregnancy in second trimester 12/05/2018  . Chronic hypertension complicating or reason for care during pregnancy, second trimester 11/20/2018  . Maternal obesity affecting pregnancy, antepartum 09/14/2018  . Supervision of high risk pregnancy, antepartum 08/29/2018  . History of depression 08/29/2018  . Rh negative state in antepartum period 09/03/2015  . Rubella non-immune status, antepartum 09/03/2015  . Severe episode of recurrent major depressive disorder, without psychotic features ([place])   . Alcohol use disorder     Past Medical History: Past Medical History:  Diagnosis Date  . Anxiety   . COVID-19 virus detected   . Depression   . Vaginal Pap smear, abnormal     Past Surgical History: Past Surgical History:  Procedure Laterality Date  . BREAST SURGERY  2013   Lt & Rt excision juvenile fibroadenoma  . MASS EXCISION  10/29/2011   Procedure: EXCISION MASS;  Surgeon: Harl Bowie, MD;  Location: [facility]S;  Service: General;  Laterality: Bilateral;  excision of bilateral  breast masses    Social History: Social History   Tobacco Use  . Smoking status: Never Smoker  . Smokeless tobacco: Never Used  Substance Use Topics  . Alcohol use: Not Currently    Alcohol/week: 2.0 - 3.0 standard drinks    Types: 2 - 3 Shots of liquor per week    Frequency: Never    Comment: every weekend before pregnancy (4-5 drinks)  . Drug use: No   Please also refer to relevant sections of EMR.  Family History: Family History  Problem Relation Age of Onset  . Cancer Maternal Grandmother        lung cancer  . Cancer Other        bladder cancer  . Asthma Brother      Allergies and Medications: No Known Allergies No current facility-administered medications on file prior to encounter.    Current Outpatient Medications on File Prior to Encounter  Medication Sig Dispense Refill  . acetaminophen (TYLENOL) 500 MG tablet Take 1,000 mg by mouth every 6 (six) hours as needed for moderate pain.    Marland Kitchen aspirin EC 81 MG tablet Take 1 tablet (81 mg total) by mouth daily. 90 tablet 1  . Blood Pressure Monitoring (BLOOD PRESSURE KIT) DEVI 1 Device by Does not apply route as needed. ICD 10: Z39.90 1 Device 0  . metoCLOPramide (REGLAN) 10 MG tablet Take 1 tablet (10 mg total) by mouth every 8 (eight) hours as needed for nausea (or headache (to be taken with tylenol)). 30 tablet 0  . prenatal vitamin w/FE, FA (PRENATAL 1 + 1) 27-1 MG TABS tablet Take 1 tablet by mouth daily at 12 noon. 30 tablet 11  . sertraline (ZOLOFT) 50 MG tablet Take 1 tablet (50 mg total) by mouth daily. 30 tablet 6    Objective: BP 138/77   Pulse (!) 117   Temp (!) 100.4 F (38 C) (Oral)   Resp (!) 25   LMP 06/23/2018 (Exact Date)   SpO2 99%  Exam: Physical Exam Constitutional:      General: She is not in acute distress.    Appearance: Normal appearance. She is obese. She is ill-appearing. She is not toxic-appearing or diaphoretic.  HENT:     Head: Normocephalic and atraumatic.  Eyes:     Conjunctiva/sclera: Conjunctivae normal.  Cardiovascular:     Rate and Rhythm: Regular rhythm. Tachycardia present.     Pulses: Normal pulses.     Heart sounds: Normal heart sounds. No murmur. No friction rub. No gallop.   Pulmonary:     Effort: No respiratory distress.     Breath sounds: No stridor. No wheezing, rhonchi or rales.     Comments: Tachypneic, diminished air movement at bilateral bases Abdominal:     General: Abdomen is flat. Bowel sounds are normal. There is no distension.     Palpations: Abdomen is soft.     Tenderness: There is no abdominal tenderness. There is no guarding.   Musculoskeletal:        General: No swelling or tenderness.     Right lower leg: No edema.     Left lower leg: No edema.  Skin:    General: Skin is warm and dry.     Capillary Refill: Capillary refill takes less than 2 seconds.     Coloration: Skin is not pale.     Findings: No erythema.  Neurological:     General: No focal deficit present.     Mental Status: She is  alert and oriented to person, place, and time. Mental status is at baseline.  Psychiatric:        Mood and Affect: Mood normal.        Behavior: Behavior normal.     Labs and Imaging: CBC BMET  Recent Labs  Lab 12/07/18 2104  WBC 8.1  HGB 11.0*  HCT 35.0*  PLT 329   Recent Labs  Lab 12/07/18 2104  NA 133*  K 3.1*  CL 102  CO2 18*  BUN 5*  CREATININE 0.78  GLUCOSE 104*  CALCIUM 9.1     EKG: Sinus tachycardia Possible Left atrial enlargement Rightward axis Low voltage QRS Nonspecific ST abnormality  Dg Chest Portable 1 View  Result Date: 12/07/2018 CLINICAL DATA:  COVID-19 EXAM: PORTABLE CHEST 1 VIEW COMPARISON:  05/15/2014 FINDINGS: Mild cardiomegaly with central vascular congestion. Patchy airspace disease at the bases. No pneumothorax. IMPRESSION: 1. Cardiomegaly with vascular congestion 2. Patchy airspace disease at the bases may reflect atelectasis or pneumonia Electronically Signed   By: Donavan Foil M.D.   On: 12/07/2018 22:40    Gladys Damme, MD 12/07/2018, 11:29 PM PGY-1, [facility] pager: [phone], text pages welcome  FPTS Upper-Level Resident Addendum   I have independently interviewed and examined the patient. I have discussed the above with the original author and agree with their documentation. My edits for correction/addition/clarification are in blue. Please see also any attending notes.    Kathrene Alu, MD PGY-3, [facility] 12/08/2018 2:25 AM  FPTS Service pager: [phone] (text pages welcome through [facility])

## 2018-12-08 ENCOUNTER — Encounter (HOSPITAL_COMMUNITY): Payer: Self-pay

## 2018-12-08 DIAGNOSIS — J1289 Other viral pneumonia: Secondary | ICD-10-CM | POA: Diagnosis present

## 2018-12-08 DIAGNOSIS — J9601 Acute respiratory failure with hypoxia: Secondary | ICD-10-CM | POA: Diagnosis present

## 2018-12-08 DIAGNOSIS — E669 Obesity, unspecified: Secondary | ICD-10-CM | POA: Diagnosis present

## 2018-12-08 DIAGNOSIS — O99512 Diseases of the respiratory system complicating pregnancy, second trimester: Secondary | ICD-10-CM | POA: Diagnosis not present

## 2018-12-08 DIAGNOSIS — O26892 Other specified pregnancy related conditions, second trimester: Secondary | ICD-10-CM | POA: Diagnosis present

## 2018-12-08 DIAGNOSIS — O212 Late vomiting of pregnancy: Secondary | ICD-10-CM | POA: Diagnosis present

## 2018-12-08 DIAGNOSIS — O99212 Obesity complicating pregnancy, second trimester: Secondary | ICD-10-CM | POA: Diagnosis present

## 2018-12-08 DIAGNOSIS — Z148 Genetic carrier of other disease: Secondary | ICD-10-CM | POA: Diagnosis not present

## 2018-12-08 DIAGNOSIS — O99352 Diseases of the nervous system complicating pregnancy, second trimester: Secondary | ICD-10-CM | POA: Diagnosis present

## 2018-12-08 DIAGNOSIS — O10012 Pre-existing essential hypertension complicating pregnancy, second trimester: Secondary | ICD-10-CM | POA: Diagnosis present

## 2018-12-08 DIAGNOSIS — O99412 Diseases of the circulatory system complicating pregnancy, second trimester: Secondary | ICD-10-CM | POA: Diagnosis present

## 2018-12-08 DIAGNOSIS — U071 COVID-19: Secondary | ICD-10-CM

## 2018-12-08 DIAGNOSIS — F329 Major depressive disorder, single episode, unspecified: Secondary | ICD-10-CM | POA: Diagnosis present

## 2018-12-08 DIAGNOSIS — O10912 Unspecified pre-existing hypertension complicating pregnancy, second trimester: Secondary | ICD-10-CM | POA: Diagnosis not present

## 2018-12-08 DIAGNOSIS — R0602 Shortness of breath: Secondary | ICD-10-CM | POA: Diagnosis not present

## 2018-12-08 DIAGNOSIS — O98512 Other viral diseases complicating pregnancy, second trimester: Secondary | ICD-10-CM | POA: Diagnosis not present

## 2018-12-08 DIAGNOSIS — R918 Other nonspecific abnormal finding of lung field: Secondary | ICD-10-CM | POA: Diagnosis not present

## 2018-12-08 DIAGNOSIS — B9620 Unspecified Escherichia coli [E. coli] as the cause of diseases classified elsewhere: Secondary | ICD-10-CM | POA: Diagnosis present

## 2018-12-08 DIAGNOSIS — G4700 Insomnia, unspecified: Secondary | ICD-10-CM | POA: Diagnosis present

## 2018-12-08 DIAGNOSIS — O26899 Other specified pregnancy related conditions, unspecified trimester: Secondary | ICD-10-CM | POA: Diagnosis not present

## 2018-12-08 DIAGNOSIS — I517 Cardiomegaly: Secondary | ICD-10-CM | POA: Diagnosis present

## 2018-12-08 DIAGNOSIS — Z3A24 24 weeks gestation of pregnancy: Secondary | ICD-10-CM | POA: Diagnosis not present

## 2018-12-08 DIAGNOSIS — O99342 Other mental disorders complicating pregnancy, second trimester: Secondary | ICD-10-CM | POA: Diagnosis present

## 2018-12-08 DIAGNOSIS — Z6791 Unspecified blood type, Rh negative: Secondary | ICD-10-CM | POA: Diagnosis not present

## 2018-12-08 DIAGNOSIS — O2342 Unspecified infection of urinary tract in pregnancy, second trimester: Secondary | ICD-10-CM | POA: Diagnosis present

## 2018-12-08 LAB — BASIC METABOLIC PANEL
Anion gap: 14 (ref 5–15)
BUN: <5 mg/dL — ABNORMAL LOW (ref 6–20)
CO2: 17 mmol/L — ABNORMAL LOW (ref 22–32)
Calcium: 9 mg/dL (ref 8.9–10.3)
Chloride: 105 mmol/L (ref 98–111)
Creatinine, Ser: 0.68 mg/dL (ref 0.44–1.00)
GFR calc Af Amer: >60 mL/min (ref >60)
GFR calc non Af Amer: >60 mL/min (ref >60)
Glucose, Bld: 103 mg/dL — ABNORMAL HIGH (ref 70–99)
Potassium: 3.7 mmol/L (ref 3.5–5.1)
Sodium: 136 mmol/L (ref 135–145)

## 2018-12-08 LAB — CBC
HCT: 33.2 % — ABNORMAL LOW (ref 36.0–46.0)
Hemoglobin: 10.5 g/dL — ABNORMAL LOW (ref 12.0–15.0)
MCH: 22.5 pg — ABNORMAL LOW (ref 26.0–34.0)
MCHC: 31.6 g/dL (ref 30.0–36.0)
MCV: 71.2 fL — ABNORMAL LOW (ref 80.0–100.0)
Platelets: 298 10*3/uL (ref 150–400)
RBC: 4.66 MIL/uL (ref 3.87–5.11)
RDW: 15.5 % (ref 11.5–15.5)
WBC: 7.9 10*3/uL (ref 4.0–10.5)
nRBC: 0 % (ref 0.0–0.2)

## 2018-12-08 LAB — TYPE AND SCREEN
ABO/RH(D): B NEG
Antibody Screen: NEGATIVE

## 2018-12-08 LAB — MAGNESIUM: Magnesium: 1.7 mg/dL (ref 1.7–2.4)

## 2018-12-08 LAB — TROPONIN I (HIGH SENSITIVITY): Troponin I (High Sensitivity): 4 ng/L (ref <18)

## 2018-12-08 LAB — HIV ANTIBODY (ROUTINE TESTING W REFLEX): HIV Screen 4th Generation wRfx: NONREACTIVE

## 2018-12-08 LAB — PHOSPHORUS: Phosphorus: 3.8 mg/dL (ref 2.5–4.6)

## 2018-12-08 MED ORDER — SODIUM CHLORIDE 0.9 % IV SOLN
200.0000 mg | Freq: Once | INTRAVENOUS | Status: AC
  Administered 2018-12-08: 200 mg via INTRAVENOUS
  Filled 2018-12-08: qty 40

## 2018-12-08 MED ORDER — ASPIRIN EC 81 MG PO TBEC
81.0000 mg | DELAYED_RELEASE_TABLET | Freq: Every day | ORAL | Status: DC
  Administered 2018-12-08 – 2018-12-12 (×5): 81 mg via ORAL
  Filled 2018-12-08 (×5): qty 1

## 2018-12-08 MED ORDER — SODIUM CHLORIDE 0.9 % IV SOLN
100.0000 mg | INTRAVENOUS | Status: DC
  Administered 2018-12-09 – 2018-12-11 (×3): 100 mg via INTRAVENOUS
  Filled 2018-12-08 (×4): qty 20

## 2018-12-08 MED ORDER — DOCUSATE SODIUM 100 MG PO CAPS
100.0000 mg | ORAL_CAPSULE | Freq: Every day | ORAL | Status: DC
  Administered 2018-12-08 – 2018-12-12 (×5): 100 mg via ORAL
  Filled 2018-12-08 (×5): qty 1

## 2018-12-08 MED ORDER — SODIUM CHLORIDE 0.9 % IV BOLUS
1000.0000 mL | Freq: Once | INTRAVENOUS | Status: AC
  Administered 2018-12-08: 1000 mL via INTRAVENOUS

## 2018-12-08 MED ORDER — PRENATAL MULTIVITAMIN CH
1.0000 | ORAL_TABLET | Freq: Every day | ORAL | Status: DC
  Administered 2018-12-08 – 2018-12-12 (×5): 1 via ORAL
  Filled 2018-12-08 (×5): qty 1

## 2018-12-08 MED ORDER — PRENATAL PLUS 27-1 MG PO TABS
1.0000 | ORAL_TABLET | Freq: Every day | ORAL | Status: DC
  Filled 2018-12-08: qty 1

## 2018-12-08 MED ORDER — ONDANSETRON HCL 4 MG/2ML IJ SOLN
4.0000 mg | Freq: Four times a day (QID) | INTRAMUSCULAR | Status: DC | PRN
  Administered 2018-12-08: 4 mg via INTRAVENOUS
  Filled 2018-12-08: qty 2

## 2018-12-08 MED ORDER — MAGNESIUM SULFATE 2 GM/50ML IV SOLN
2.0000 g | Freq: Once | INTRAVENOUS | Status: AC
  Administered 2018-12-08: 2 g via INTRAVENOUS
  Filled 2018-12-08: qty 50

## 2018-12-08 MED ORDER — SODIUM CHLORIDE 0.9 % IV SOLN: INTRAVENOUS | Status: DC

## 2018-12-08 MED ORDER — CEPHALEXIN 500 MG PO CAPS
500.0000 mg | ORAL_CAPSULE | Freq: Three times a day (TID) | ORAL | Status: AC
  Administered 2018-12-08 – 2018-12-10 (×13): 500 mg via ORAL
  Filled 2018-12-08 (×6): qty 1
  Filled 2018-12-08 (×2): qty 2
  Filled 2018-12-08 (×3): qty 1

## 2018-12-08 MED ORDER — ACETAMINOPHEN 325 MG PO TABS
650.0000 mg | ORAL_TABLET | ORAL | Status: DC | PRN
  Administered 2018-12-08 – 2018-12-11 (×5): 650 mg via ORAL
  Filled 2018-12-08 (×5): qty 2

## 2018-12-08 MED ORDER — DEXAMETHASONE SODIUM PHOSPHATE 10 MG/ML IJ SOLN
6.0000 mg | INTRAMUSCULAR | Status: DC
  Administered 2018-12-08: 6 mg via INTRAVENOUS
  Filled 2018-12-08: qty 1

## 2018-12-08 MED ORDER — METOCLOPRAMIDE HCL 10 MG PO TABS: 10.0000 mg | ORAL_TABLET | Freq: Three times a day (TID) | ORAL | Status: DC | PRN

## 2018-12-08 MED ORDER — CALCIUM CARBONATE ANTACID 500 MG PO CHEW
2.0000 | CHEWABLE_TABLET | ORAL | Status: DC | PRN
  Filled 2018-12-08: qty 2

## 2018-12-08 MED ORDER — SERTRALINE HCL 50 MG PO TABS
50.0000 mg | ORAL_TABLET | Freq: Every day | ORAL | Status: DC
  Administered 2018-12-08 – 2018-12-12 (×5): 50 mg via ORAL
  Filled 2018-12-08 (×6): qty 1

## 2018-12-08 MED ORDER — ZOLPIDEM TARTRATE 5 MG PO TABS
5.0000 mg | ORAL_TABLET | Freq: Every evening | ORAL | Status: DC | PRN
  Administered 2018-12-08: 5 mg via ORAL
  Filled 2018-12-08: qty 1

## 2018-12-08 MED ORDER — ENOXAPARIN SODIUM 40 MG/0.4ML ~~LOC~~ SOLN
40.0000 mg | Freq: Every day | SUBCUTANEOUS | Status: DC
  Administered 2018-12-08 – 2018-12-12 (×5): 40 mg via SUBCUTANEOUS
  Filled 2018-12-08 (×5): qty 0.4

## 2018-12-08 MED ORDER — ACETAMINOPHEN 500 MG PO TABS
1000.0000 mg | ORAL_TABLET | Freq: Four times a day (QID) | ORAL | Status: DC | PRN
  Administered 2018-12-10 – 2018-12-12 (×3): 1000 mg via ORAL
  Filled 2018-12-08 (×3): qty 2

## 2018-12-08 NOTE — Progress Notes (Signed)
Pharmacy Consult   S/A: 23 yo f who is 50 w pregnant presenting with COVID after positive test 11/1  Now requiring 2 L o2  CXR noted for possible PNA  No LFTs  Plan: Remdesivir 200 mg x 1 then 100 mg daily for 4 days Consent noted in progress note by MD Daily CMP to monitor LFTs  Barth Kirks, PharmD, BCPS, BCCCP Clinical Pharmacist [phone]  Please check AMION for all [place] numbers  12/08/2018 6:01 PM

## 2018-12-08 NOTE — ED Notes (Signed)
Regular breakfast tray ordered.  

## 2018-12-08 NOTE — Discharge Summary (Addendum)
[facility] Discharge Summary  Patient name: Katrina Stewart Medical record number: [id number] Date of birth: [birth year] Age: 23 y.o. Gender: female Date of Admission: 12/07/2018  Date of Discharge: 12/12/18  Admitting Physician: Leeanne Rio, MD  Primary Care Provider: Patient, No Pcp Per Consultants: OB  Indication for Hospitalization: Shortness of breath, nausea vomiting  Discharge Diagnoses/Problem List:  COVID-19 Second semester pregnancy Hypertension Asymptomatic bacteriuria in pregnancy Major depressive disorder Insomnia Elevated blood sugar during pregnancy  Disposition: Home  Discharge Condition: Improved and stable  Discharge Exam:  GEN: pleasant, female in no acute distress CV: regular rate and rhythm, no murmurs appreciated  RESP: no increased work of breathing, clear to ascultation bilaterally with no crackles, wheezes, or rhonchi  ABD: Gravid abdomen, bowel sounds present, nontender MSK: no lower extremity edema, or cyanosis noted SKIN: warm, dry NEURO: grossly normal, moves all extremities appropriately PSYCH: Normal affect and thought content  Brief Hospital Course:  Katrina Stewart is a G3P1001 at [redacted]w[redacted]d 23y.o. female with history of acute worsening of shortness of breath, cough, nausea and vomiting.  Patient was known to be Covid positive however she was unable to tolerate oral intake for 24 hours prior to admission. In the ED, she received IV fluids.  Chest x-ray revealed cardiomegaly with vascular congestion and patchy airspace disease at the bases suggestive of atelectasis or pneumonia. On exam, she was euvolemic, tachypneic and had bibasilar diminished air movement.  Sensitivity troponins were in normal limits.  BMP revealed hyponatremia and hypokalemia that resolved prior to discharge.  Potassium was repleted with 40 K-Dur mEq as needed.  She was treated with remdesivir for 4 total days and dexamethasone for 5 days while  admitted. She was discharged to complete a 10 day course. Patient required supplemental oxygen however was weaned to room air.  She passed her ambulatory desaturation test on the day of discharge.  Regarding her pregnancy, she continued to deny cramping, loss of fluid, vaginal bleeding or decreased fetal movement.  Obstetrics followed during her admission.  Her blood pressures remained normotensive during her hospitalization. She has OB follow-up scheduled on 12/22/2018.    Issues for Follow Up:  1. Recommend follow up urinalysis to screen for asymptomatic bacteriuria.  She completed 5-day course of Keflex in her admission. 2. Recommend follow-up to check oxygen saturation remains at patient's baseline.  Significant Procedures: None   Significant Labs and Imaging:  Recent Labs  Lab 12/08/18 0954 12/10/18 0406 12/11/18 0544  WBC 7.9 8.7 8.1  HGB 10.5* 10.4* 10.1*  HCT 33.2* 32.1* 32.1*  PLT 298 358 382   Recent Labs  Lab 12/08/18 0500 12/08/18 0604 12/09/18 0353 12/10/18 0406 12/11/18 0544 12/12/18 0502  NA 136  --  137 137 138 138  K 3.7  --  3.5 3.1* 3.3* 3.2*  CL 105  --  106 104 106 107  CO2 17*  --  18* 22 21* 22  GLUCOSE 103*  --  112* 91 84 84  BUN <5*  --  5* 6 6 6   CREATININE 0.68  --  0.66 0.51 0.52 0.52  CALCIUM 9.0  --  8.9 9.0 8.9 9.0  MG 1.7  --   --   --   --  1.6*  PHOS  --  3.8  --   --   --   --   ALKPHOS  --   --  52 47 45 48  AST  --   --  16  13* 13* 16  ALT  --   --  13 11 12 15   ALBUMIN  --   --  2.5* 2.4* 2.4* 2.4*   Dg Chest Portable 1 View  Result Date: 12/07/2018 CLINICAL DATA:  COVID-19 EXAM: PORTABLE CHEST 1 VIEW COMPARISON:  05/15/2014 FINDINGS: Mild cardiomegaly with central vascular congestion. Patchy airspace disease at the bases. No pneumothorax. IMPRESSION: 1. Cardiomegaly with vascular congestion 2. Patchy airspace disease at the bases may reflect atelectasis or pneumonia Electronically Signed   By: Donavan Foil M.D.   On: 12/07/2018  22:40    Results/Tests Pending at Time of Discharge: None   Discharge Medications:  Allergies as of 12/12/2018   No Known Allergies     Medication List    STOP taking these medications   guaiFENesin-dextromethorphan 100-10 MG/5ML syrup Commonly known as: ROBITUSSIN DM     TAKE these medications   acetaminophen 500 MG tablet Commonly known as: TYLENOL Take 1,000 mg by mouth every 6 (six) hours as needed for moderate pain.   aspirin EC 81 MG tablet Take 1 tablet (81 mg total) by mouth daily. Notes to patient: 12/13/2018 AM   Blood Pressure Kit Devi 1 Device by Does not apply route as needed. ICD 10: Z39.90   dexamethasone 6 MG tablet Commonly known as: DECADRON Take 1 tablet (6 mg total) by mouth daily for 5 days. Notes to patient: 12/13/2018 AM   metoCLOPramide 10 MG tablet Commonly known as: REGLAN Take 1 tablet (10 mg total) by mouth every 8 (eight) hours as needed for nausea (or headache (to be taken with tylenol)).   ondansetron 4 MG disintegrating tablet Commonly known as: ZOFRAN-ODT Take 4 mg by mouth every 8 (eight) hours as needed for nausea or vomiting.   prenatal vitamin w/FE, FA 27-1 MG Tabs tablet Take 1 tablet by mouth daily at 12 noon. Notes to patient: 12/13/2018 Noon   sertraline 50 MG tablet Commonly known as: Zoloft Take 1 tablet (50 mg total) by mouth daily. Notes to patient: 12/13/2018 AM       Discharge Instructions: Please refer to Patient Instructions section of EMR for full details.  Patient was counseled important signs and symptoms that should prompt return to medical care, changes in medications, dietary instructions, activity restrictions, and follow up appointments.   Follow-Up Appointments:   Lyndee Hensen, MD 12/12/2018, 4:04 PM PGY-1, [facility] ------------------------------------------------------------------------------- Guadalupe Dawn MD PGY-3 Family Medicine Resident

## 2018-12-08 NOTE — Consult Note (Signed)
Reason for Consult:Pregnancy with covid 19 admission Referring Physician: Milus Banister MD  Katrina Stewart is an 23 y.o. female. [id number] [redacted]w[redacted]d Patient is admitted to [facility] for management of respiratory symptoms with Covid 19 positive. Patient admitted with 1 week of N, V. Unable to tolerate any food/drink 24 hours prior to admission. Patient was able to tolerate breakfast this morning, did not receive any antiemetics in the ED. She received 1.5L [place] in the ED. Patient endorses fetal movement, denies vaginal bleeding, loss of fluid, and cramping.   Pertinent Gynecological History:  Last pap: normal Date: 09/14/2018 OB History: G3, P1   Menstrual History:  Patient's last menstrual period was 06/23/2018 (exact date).    Past Medical History:  Diagnosis Date  . Anxiety   . COVID-19 virus detected   . Depression   . Vaginal Pap smear, abnormal     Past Surgical History:  Procedure Laterality Date  . BREAST SURGERY  2013   Lt & Rt excision juvenile fibroadenoma  . MASS EXCISION  10/29/2011   Procedure: EXCISION MASS;  Surgeon: Harl Bowie, MD;  Location: [facility];  Service: General;  Laterality: Bilateral;  excision of bilateral breast masses    Family History  Problem Relation Age of Onset  . Cancer Maternal Grandmother        lung cancer  . Cancer Other        bladder cancer  . Asthma Brother     Social History:  reports that she has never smoked. She has never used smokeless tobacco. She reports previous alcohol use of about 2.0 - 3.0 standard drinks of alcohol per week. She reports that she does not use drugs.  Allergies: No Known Allergies  Medications: I have reviewed the patient's current medications.  Review of Systems  Constitutional: Negative for fever.  Respiratory: Positive for cough. Negative for wheezing.        Increased rate  Gastrointestinal: Positive for nausea and vomiting.  Neurological: Negative.   Psychiatric/Behavioral:  Negative.   Fetal movement normal  Blood pressure 132/81, pulse (!) 109, temperature 98.4 F (36.9 C), temperature source Oral, resp. rate 20, height 5\' 8"  (1.727 m), weight 120 kg, last menstrual period 06/23/2018, SpO2 95 %.   Physical Exam  Nursing note and vitals reviewed. Constitutional: She appears well-developed. She appears distressed.  Psychiatric: She has a normal mood and affect. Her behavior is normal.  Patient was seen briefly from outside the room and I spoke to her on the phone, unable to connect a video call  Results for orders placed or performed during the hospital encounter of 12/07/18 (from the past 48 hour(s))  CBC with Differential     Status: Abnormal   Collection Time: 12/07/18  9:04 PM  Result Value Ref Range   WBC 8.1 4.0 - 10.5 K/uL   RBC 4.82 3.87 - 5.11 MIL/uL   Hemoglobin 11.0 (L) 12.0 - 15.0 g/dL   HCT 35.0 (L) 36.0 - 46.0 %   MCV 72.6 (L) 80.0 - 100.0 fL   MCH 22.8 (L) 26.0 - 34.0 pg   MCHC 31.4 30.0 - 36.0 g/dL   RDW 15.4 11.5 - 15.5 %   Platelets 329 150 - 400 K/uL   nRBC 0.0 0.0 - 0.2 %   Neutrophils Relative % 77 %   Neutro Abs 6.3 1.7 - 7.7 K/uL   Lymphocytes Relative 13 %   Lymphs Abs 1.0 0.7 - 4.0 K/uL   Monocytes Relative 8 %  Monocytes Absolute 0.7 0.1 - 1.0 K/uL   Eosinophils Relative 0 %   Eosinophils Absolute 0.0 0.0 - 0.5 K/uL   Basophils Relative 0 %   Basophils Absolute 0.0 0.0 - 0.1 K/uL   Immature Granulocytes 2 %   Abs Immature Granulocytes 0.13 (H) 0.00 - 0.07 K/uL    Comment: Performed at [facility] [address], [place], [place] [id number]  Basic metabolic panel     Status: Abnormal   Collection Time: 12/07/18  9:04 PM  Result Value Ref Range   Sodium 133 (L) 135 - 145 mmol/L   Potassium 3.1 (L) 3.5 - 5.1 mmol/L   Chloride 102 98 - 111 mmol/L   CO2 18 (L) 22 - 32 mmol/L   Glucose, Bld 104 (H) 70 - 99 mg/dL   BUN 5 (L) 6 - 20 mg/dL   Creatinine, Ser 0.78 0.44 - 1.00 mg/dL   Calcium 9.1 8.9 - 10.3 mg/dL    GFR calc non Af Amer >60 >60 mL/min   GFR calc Af Amer >60 >60 mL/min   Anion gap 13 5 - 15    Comment: Performed at [facility], [place] [address], [place], [place] [address]  Troponin I (High Sensitivity)     Status: None   Collection Time: 12/07/18  9:49 PM  Result Value Ref Range   Troponin I (High Sensitivity) 5 <18 ng/L    Comment: (NOTE) Elevated high sensitivity troponin I (hsTnI) values and significant  changes across serial measurements may suggest ACS but many other  chronic and acute conditions are known to elevate hsTnI results.  Refer to the "Links" section for chest pain algorithms and additional  guidance. Performed at [facility], [place] [address], [place], [place] [id number]   Basic metabolic panel     Status: Abnormal   Collection Time: 12/08/18  5:00 AM  Result Value Ref Range   Sodium 136 135 - 145 mmol/L   Potassium 3.7 3.5 - 5.1 mmol/L   Chloride 105 98 - 111 mmol/L   CO2 17 (L) 22 - 32 mmol/L   Glucose, Bld 103 (H) 70 - 99 mg/dL   BUN <5 (L) 6 - 20 mg/dL   Creatinine, Ser 0.68 0.44 - 1.00 mg/dL   Calcium 9.0 8.9 - 10.3 mg/dL   GFR calc non Af Amer >60 >60 mL/min   GFR calc Af Amer >60 >60 mL/min   Anion gap 14 5 - 15    Comment: Performed at [facility], [place] [address], [place], [place] [address]  Magnesium     Status: None   Collection Time: 12/08/18  5:00 AM  Result Value Ref Range   Magnesium 1.7 1.7 - 2.4 mg/dL    Comment: Performed at [facility], [place] [address], [place], [place] [address]  Troponin I (High Sensitivity)     Status: None   Collection Time: 12/08/18  6:04 AM  Result Value Ref Range   Troponin I (High Sensitivity) 4 <18 ng/L    Comment: (NOTE) Elevated high sensitivity troponin I (hsTnI) values and significant  changes across serial measurements may suggest ACS but many other  chronic and acute conditions are known to elevate hsTnI results.  Refer to the "Links" section for chest pain algorithms and  additional  guidance. Performed at [facility], [place] [address] [place], [place] [address]   HIV Antibody (routine testing w rflx)     Status: None  Collection Time: 12/08/18  6:04 AM  Result Value Ref Range   HIV Screen 4th Generation wRfx NON REACTIVE NON REACTIVE    Comment: Performed at [facility], [address] [place], [place] [address]  Phosphorus     Status: None   Collection Time: 12/08/18  6:04 AM  Result Value Ref Range   Phosphorus 3.8 2.5 - 4.6 mg/dL    Comment: Performed at [facility] [address], [place], [place] [address]  CBC     Status: Abnormal   Collection Time: 12/08/18  9:54 AM  Result Value Ref Range   WBC 7.9 4.0 - 10.5 K/uL   RBC 4.66 3.87 - 5.11 MIL/uL   Hemoglobin 10.5 (L) 12.0 - 15.0 g/dL   HCT 33.2 (L) 36.0 - 46.0 %   MCV 71.2 (L) 80.0 - 100.0 fL   MCH 22.5 (L) 26.0 - 34.0 pg   MCHC 31.6 30.0 - 36.0 g/dL   RDW 15.5 11.5 - 15.5 %   Platelets 298 150 - 400 K/uL   nRBC 0.0 0.0 - 0.2 %    Comment: Performed at [facility], [place] [address], [place], [place] [address]    Dg Chest Portable 1 View  Result Date: 12/07/2018 CLINICAL DATA:  COVID-19 EXAM: PORTABLE CHEST 1 VIEW COMPARISON:  05/15/2014 FINDINGS: Mild cardiomegaly with central vascular congestion. Patchy airspace disease at the bases. No pneumothorax. IMPRESSION: 1. Cardiomegaly with vascular congestion 2. Patchy airspace disease at the bases may reflect atelectasis or pneumonia Electronically Signed   By: Donavan Foil M.D.   On: 12/07/2018 22:40    Current Facility-Administered Medications:  .  acetaminophen (TYLENOL) tablet 1,000 mg, 1,000 mg, Oral, Q6H PRN, Winfrey, Amanda C, MD .  acetaminophen (TYLENOL) tablet 650 mg, 650 mg, Oral, Q4H PRN, [name], MD .  aspirin EC tablet 81 mg, 81 mg, Oral, Daily, Winfrey, Alcario Drought, MD, 81 mg at 12/08/18 0913 .  calcium carbonate (TUMS - dosed in mg elemental calcium) chewable tablet 400 mg of elemental  calcium, 2 tablet, Oral, Q4H PRN, [name], MD .  cephALEXin [facility]) capsule 500 mg, 500 mg, Oral, TID AC & HS, Hammons, Kimberly B, RPH, 500 mg at 12/08/18 0826 .  docusate sodium (COLACE) capsule 100 mg, 100 mg, Oral, Daily, [name], MD .  enoxaparin (LOVENOX) injection 40 mg, 40 mg, Subcutaneous, Daily, Winfrey, Alcario Drought, MD, 40 mg at 12/08/18 0913 .  magnesium sulfate IVPB 2 g 50 mL, 2 g, Intravenous, Once, Enid Derry, [place], DO .  metoCLOPramide (REGLAN) tablet 10 mg, 10 mg, Oral, Q8H PRN, Winfrey, Amanda C, MD .  ondansetron (ZOFRAN) injection 4 mg, 4 mg, Intravenous, Q6H PRN, Winfrey, Amanda C, MD .  prenatal multivitamin tablet 1 tablet, 1 tablet, Oral, Q1200, [name], MD .  sertraline (ZOLOFT) tablet 50 mg, 50 mg, Oral, Daily, Winfrey, Amanda C, MD, 50 mg at 12/08/18 0913 .  zolpidem (AMBIEN) tablet 5 mg, 5 mg, Oral, QHS PRN, [name], MD  Assessment/Plan: [redacted]w[redacted]d Covid positive with increased symptoms but not requiring supplemental oxygen at this time [place] Pregnancy Admission Checklist  All COVID admissions (our patients/unassigned patients with or without a formal consult)with viable pregnancies should have the following: 1) A formal consult note with detailed plan for fetal monitoring and obstetric care during the hospital course-daily doppler ordered currently 2) Discussion with patient about possible need for emergent cesarean delivery and consent on chart 3) Highlighted  on [facility] list 4) Review chart to see if exposure can be traced to recent office or MAU visits, and do contact traces 5) Remote daily progress note from MD (Second Attending) 6) MFM Consult 7) NICU Consult 8) Ensure the [facility] team and multidisciplinary team is always aware of the patient/Safety Rounds 9) Notify Drs. Harraway-Smith and Darron Doom of patient's admission 10) Add patient to [place] list for outpatient follow up   Emeterio Reeve 12/08/2018

## 2018-12-08 NOTE — ED Notes (Signed)
Pt sitting up and eating breakfast 

## 2018-12-08 NOTE — Progress Notes (Signed)
FPTS Interim Progress Note  S: Patient able to talk in complete sentences and states that she feels much better after being given Tylenol and being placed on 2L .  Discussed with OB provider, Dr. Kennon Rounds over the phone who recommended starting dexamethasone and remdesivir as indicated.  She states she will speak with NICU as indicated in Dr. Wilton Thrall Hawks previous note.  Paged CCM to discuss starting remdesivir.  Discussed with patient escalation of treatment including dexamethasone and remdesivir.  Patient able to verbalize the risks associated with the lack of studies on remdesivir in pregnant patients but would like to move forward with starting both remdesivir and dexamethasone.  O: BP 110/64   Pulse (!) 117   Temp 99.7 F (37.6 C) (Oral)   Resp 20   Ht 5\' 8"  (1.727 m)   Wt 120 kg   LMP 06/23/2018 (Exact Date)   SpO2 97%   BMI 40.22 kg/m    A/P: Continue to monitor closely.  Patient paced on telemetry.  Will start Decadron 6 mg x 9 more days as patient has already received 1 dose in the ED.  We will also initiate remdesivir therapy given that patient has consented knowing the risks involved with this medication.  Karlon Schlafer, [place], DO 12/08/2018, 5:48 PM PGY-3, [place] Medicine Service pager [phone]

## 2018-12-08 NOTE — Significant Event (Signed)
Rapid Response Event Note  Overview: Time Called: 1615    Initial Focused Assessment: Patient admitted this am N&V x 1 week, she is [redacted] weeks pregnant.  Covid (+) 11/1  Vital Signs MEWS/VS Documentation      12/08/2018 1525 12/08/2018 1600 12/08/2018 1700 12/08/2018 1730   MEWS Score:  4  2  2  2    MEWS Score Color:  Red  Yellow  Yellow  Yellow   Resp:  20  20  -  -   Pulse:  (!) 124  -  (!) 118  (!) 117   BP:  124/69  110/80  116/74  110/64   Temp:  (!) 103.1 F (39.5 C)  (!) 100.4 F (38 C)  100.1 F (37.8 C)  99.7 F (37.6 C)   O2 Device:  Nasal Cannula  Nasal Cannula  Nasal Cannula  -   O2 Flow Rate (L/min):  2 L/min  2 L/min  2 L/min  -   Level of Consciousness:  Alert  Alert  -  -     On Keflex 500 mg TID Received 1.5L in ED  Tylenol given PO Repeat temp 100.4    Raliegh Ip 12/08/2018,5:56 PM  Interventions:  Plan of Care (if not transferred): RN to call if assistance needed  Event Summary:   at      at          Raliegh Ip

## 2018-12-08 NOTE — Progress Notes (Signed)
   Vital Signs MEWS/VS Documentation      12/08/2018 1415 12/08/2018 1441 12/08/2018 1500 12/08/2018 1525   MEWS Score:  3  2  2  4    MEWS Score Color:  Yellow  Yellow  Yellow  Red   Pulse:  -  -  -  (!) 124   BP:  -  -  -  124/69   Temp:  -  -  -  (!) 103.1 F (39.5 C)   O2 Device:  -  -  -  Nasal Cannula   O2 Flow Rate (L/min):  -  -  -  2 L/min   Level of Consciousness:  -  -  -  Alert       Dr. Enid Derry notified of heart rate, 02 requirement of 2L [place] and 103.1 fever. Patient given tylenol for fever. Patient denies shortness of breath at this time. She states " I just feel like I have a fever." Patient placed on 2L of 02 and is now at 97%. Rapid Response nurse was notified of patient's red MEWS score. Follow-up VS showed a MEWS of 2(yellow). Will monitor closely and will notify appropriate provider with any changes.    Katrina Stewart 12/08/2018,3:53 PM

## 2018-12-08 NOTE — Progress Notes (Signed)
Family Medicine Teaching Service Daily Progress Note Intern Pager: [phone]  Patient name: Shaquala Cunico Medical record number: [id number] Date of birth: 05-01-1995 Age: 23 y.o. Gender: female  Primary Care Provider: Patient, No Pcp Per Consultants: OBGYN Code Status: Full  Pt Overview and Major Events to Date:  11/6: Admitted   Assessment and Plan: Amarah Dome is a G3P1011 at [redacted]w[redacted]d  23 y.o. female presenting with SOB, tachypnea, nausea, vomiting and unable to tolerate PO. PMH is significant for COVID-19 (+) affecting 2nd trimester pregnancy, Chronic HTN, MDD, Rh negative, RNI, supervision of high risk pregnancy, maternal obesity affecting pregnancy, Alpha Thalassemia carrier, h/o EtOH SUD, UTI.  Nausea  Vomiting  COVID-19 during second trimester of pregnancy Patient admitted with 1 week of N, V. Unable to tolerate any food/drink 24 hours prior to admission. Patient was able to tolerate breakfast this morning, did not receive any antiemetics in the ED. She received 1.5L [place] in the ED. Patient endorses fetal movement, denies vaginal bleeding, loss of fluid, and cramping. -Continuous cardiac monitoring and pulse oximetry -No IV fluids as she's able to hydrate PO -Ondansetron 4 mg IV every 6 as needed -Metoclopramide 10 mg p.o. every 8 as needed, continued from home  -Tylenol PRN pain -Prenatal MVI -Lovenox for DVT PPx -OB Consulted - appreciate recs!  COVID-19  SOB Patient satting ~95% on RA, tachypneic up to 30s. Reports her worst symptom is her nagging cough. Lungs this AM show diminished air movement in bases, but without wheezes or crackles. CXR on admission reveals patchy airspace disease at the bases, which may suggest atelectasis or pneumonia. Pt received 1.5L NS and Decadron x1 in the ED.  -Continue to monitor respiratory status -Honey for cough - packets to be brought up from cafeteria -Continuous pulse oximetry -Will no longer give decadron as patient does not need  supplemental oxygen -Keep SpO2 >94%, can use supplemental O2 via [place] as needed  Chronic HTN BP this AM 132/81 11/6. Was recently diagnosed with chronic HTN during this pregnancy, on Aspirin 81mg . Denies headaches, vision changes, chest pain. -continue bASA -If blood pressure increases/stays high we can reach out to OBGYN about whether or not they would like [place] to order extra labs on urine  Asymptomatic bacteriuria in pregnancy Patient diagnosed w/ pan-sensitive E.coli asymptomatic bacteruria 4 days ago, was prescribed Keflex 500 mg QID x 5 days (11/4-11/8). - Continue Keflex 500 mg QID through 11/8  MDD Home medications include Zoloft 50 mg. -Continue Zoloft 50mg   FEN/GI: Regular diet as tolerated, reglan, phenergan Prophylaxis: lovenox  Disposition: med-surg for supportive care  Subjective:  Patient is seen resting in bed this morning. She was able to tolerate her breakfast this morning after receiving   Objective: Temp:  [98.4 F (36.9 C)-100.4 F (38 C)] 98.4 F (36.9 C) (11/06 0843) Pulse Rate:  [97-129] 109 (11/06 0843) Resp:  [19-40] 35 (11/06 0715) BP: (115-150)/(76-93) 132/81 (11/06 0843) SpO2:  [94 %-99 %] 95 % (11/06 0843) Physical Exam: General: no apparent distress, resting comfortably in bed, nontoxic-appearing, pleasant patient Cardiovascular: Heart rate about 100 bpm, regular rhythm, S1-S2 appreciated, no murmurs appreciated Respiratory: Distant breath sounds with questionable diminished air movement, no wheezes or crackles Abdomen: Gravid abdomen, fundal height measuring about 5 cm superior to umbilicus Extremities: Spontaneous movement in all 4 extremities, brisk capillary refill, well-perfused; no lacteration, cyanosis, deformity, or injury appreciated in physical exam  Laboratory: Recent Labs  Lab 12/03/18 1806 12/07/18 2104  WBC 9.8 8.1  HGB 10.8* 11.0*  HCT 34.1* 35.0*  PLT 265 329   Recent Labs  Lab 12/07/18 2104 12/08/18 0500  NA 133*  136  K 3.1* 3.7  CL 102 105  CO2 18* 17*  BUN 5* <5*  CREATININE 0.78 0.68  CALCIUM 9.1 9.0  GLUCOSE 104* 103*   Mg 1.7 Trop 5>4 Phos 3.8 HIV: pending  Imaging/Diagnostic Tests: CXR 11/5:  Cardiomegaly with vascular congestion 2. Patchy airspace disease at the bases may reflect atelectasis or pneumonia   Daisy Floro, DO 12/08/2018, 9:55 AM PGY-2, [place] Intern pager: [phone], text pages welcome

## 2018-12-09 ENCOUNTER — Encounter (HOSPITAL_COMMUNITY): Payer: Self-pay | Admitting: *Deleted

## 2018-12-09 LAB — COMPREHENSIVE METABOLIC PANEL
ALT: 13 U/L (ref 0–44)
AST: 16 U/L (ref 15–41)
Albumin: 2.5 g/dL — ABNORMAL LOW (ref 3.5–5.0)
Alkaline Phosphatase: 52 U/L (ref 38–126)
Anion gap: 13 (ref 5–15)
BUN: 5 mg/dL — ABNORMAL LOW (ref 6–20)
CO2: 18 mmol/L — ABNORMAL LOW (ref 22–32)
Calcium: 8.9 mg/dL (ref 8.9–10.3)
Chloride: 106 mmol/L (ref 98–111)
Creatinine, Ser: 0.66 mg/dL (ref 0.44–1.00)
GFR calc Af Amer: >60 mL/min (ref >60)
GFR calc non Af Amer: >60 mL/min (ref >60)
Glucose, Bld: 112 mg/dL — ABNORMAL HIGH (ref 70–99)
Potassium: 3.5 mmol/L (ref 3.5–5.1)
Sodium: 137 mmol/L (ref 135–145)
Total Bilirubin: 0.5 mg/dL (ref 0.3–1.2)
Total Protein: 6.4 g/dL — ABNORMAL LOW (ref 6.5–8.1)

## 2018-12-09 LAB — FERRITIN: Ferritin: 53 ng/mL (ref 11–307)

## 2018-12-09 LAB — GLUCOSE, CAPILLARY
Glucose-Capillary: 83 mg/dL (ref 70–99)
Glucose-Capillary: 116 mg/dL — ABNORMAL HIGH (ref 70–99)

## 2018-12-09 LAB — PROTIME-INR
INR: 1 (ref 0.8–1.2)
Prothrombin Time: 13.5 seconds (ref 11.4–15.2)

## 2018-12-09 LAB — C-REACTIVE PROTEIN: CRP: 12.3 mg/dL — ABNORMAL HIGH (ref <1.0)

## 2018-12-09 LAB — D-DIMER, QUANTITATIVE: D-Dimer, Quant: 0.57 ug/mL-FEU — ABNORMAL HIGH (ref 0.00–0.50)

## 2018-12-09 LAB — CK: Total CK: 19 U/L — ABNORMAL LOW (ref 38–234)

## 2018-12-09 LAB — LACTATE DEHYDROGENASE: LDH: 182 U/L (ref 98–192)

## 2018-12-09 MED ORDER — PHENOL 1.4 % MT LIQD
1.0000 | OROMUCOSAL | Status: DC | PRN
  Filled 2018-12-09: qty 177

## 2018-12-09 MED ORDER — DEXAMETHASONE 4 MG PO TABS
6.0000 mg | ORAL_TABLET | Freq: Every day | ORAL | Status: DC
  Administered 2018-12-09 – 2018-12-12 (×4): 6 mg via ORAL
  Filled 2018-12-09 (×4): qty 2

## 2018-12-09 NOTE — Progress Notes (Signed)
FHR 172 by doppler. Much fetal movement. Pt denies vaginal bleeding or leaking of fluid. Denies abd cramping or uc's.

## 2018-12-09 NOTE — Progress Notes (Signed)
Family Medicine Teaching Service Daily Progress Note Intern Pager: [phone]  Patient name: Katrina Stewart Date of birth: September 27, 1995 Age: 23 y.o. Gender: female  Primary Care Provider: Patient, No Pcp Per 11/6: Admitted   Assessment and Plan: Lovisa Misner a [id number] at 23 y.o.femalepresenting with SOB, tachypnea, nausea, vomiting and unable to tolerate PO. 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  Nausea  Vomiting COVID-19during second trimester of pregnancy Patient admitted with 1 week of N, V.  Had fever of 103F on 11/6, treated with Tylenol.  Patient able to tolerate bland foods.received 4 mg Zofran 11/6 around 2 PM.  Patient endorses fetal movement, denies vaginal bleeding, loss of fluid, and cramping. -Continuous cardiac monitoring and pulse oximetry -Oxygen supplementation 2 L nasal cannula, goal 95% saturation or better -Ondansetron 4 mg IV every 6 as needed, consider promethazine addition if nausea continues -Metoclopramide 10 mg p.o. every 8 as needed,continued from home -Tylenol PRN pain -Prenatal MVI -OB Consulted - appreciate recs: Okay with Decadron and remdesivir, will consult NICU -LFTs daily due to remdesivir -CBG monitoring given Decadron and history of A1c greater than 5.7% in early pregnancy  COVID-19  SOB Patient satting ~95% on RA, tachypneic up to 30s. Reports her worst symptom is her nagging cough. Lungs this AM show diminished air movement in bases, but without wheezes or crackles. CXR on admission reveals patchy airspace disease at the bases, whichmaysuggestatelectasis or pneumonia.Pt received 1.5L NS and Decadron x1 in the ED.  -Continue to monitor respiratory status -Honey for cough - packets to be brought up from  cafeteria -Continuous pulse oximetry -Will no longer give decadron as patient does not need supplemental oxygen -Keep SpO2 >94%, can use supplemental O2 via [place] as needed  Chronic HTN BP this AM 132/ 79 11/ 7. Was diagnosed with chronic HTN during this pregnancy, on Aspirin 81mg .   Continues to deny headaches, vision changes, chest pain. -continue bASA -If blood pressure greater than 160/100 will order urine protein/labs  Asymptomatic bacteriuria in pregnancy Patient diagnosed w/ pan-sensitive E.coli asymptomatic bacteruria 4 days ago, was prescribed Keflex 500 mg QID x 5 days (11/4-11/8). - Continue Keflex 500 mg QID through 11/8  MDD Home medications include Zoloft 50 mg. -Continue Zoloft 50mg   FEN/GI:Regular diet as tolerated, reglan, phenergan Prophylaxis:lovenox  Disposition: Patient to complete 5 days of remdesivir before discharge, can most likely discharge home pending clinical improvement   Subjective:  Patient seen this morning resting in bed, nasal cannula in place, on 2 L.  She is able to tolerate applesauce crackers, and other basic foods.  States she had diarrhea this morning.  Continues to have frustrating cough.  Feels much better after being placed on oxygen, starting remdesivir/Decadron.  Objective: Temp:  [97.5 F (36.4 C)-103.1 F (39.5 C)] 97.5 F (36.4 C) (11/07 0739) Pulse Rate:  [105-124] 105 (11/07 0739) Resp:  [18-20] 18 (11/06 1917) BP: (110-132)/(62-80) 132/79 (11/07 0739) SpO2:  [90 %-98 %] 98 % (11/07 0739) Physical Exam: General: No apparent distress, nontoxic-appearing, pleasant patient Cardiovascular: RRR, heart rate 90s, S1-S2 present, no murmurs appreciated Respiratory: Diminished breath sounds, unchanged from previous exam Abdomen: Gravid abdomen, uterine fundus about 5 finger widths from umbilicus, bowel sounds auscultated and normal Extremities: No cyanosis or deformity appreciated, no edema appreciated to bilateral lower  extremities, 2+ DP pulses appreciated bilaterally  Laboratory:  Recent Labs  Lab 12/03/18 1806 12/07/18 2104 12/08/18 0954  WBC 9.8 8.1 7.9  HGB 10.8* 11.0* 10.5*  HCT 34.1* 35.0* 33.2*  PLT 265 329 298   Recent Labs  Lab 12/07/18 2104 12/08/18 0500 12/09/18 0353  NA 133* 136 137  K 3.1* 3.7 3.5  CL 102 105 106  CO2 18* 17* 18*  BUN 5* <5* 5*  CREATININE 0.78 0.68 0.66  CALCIUM 9.1 9.0 8.9  PROT  --   --  6.4*  BILITOT  --   --  0.5  ALKPHOS  --   --  52  ALT  --   --  13  AST  --   --  16  GLUCOSE 104* 103* 112*    LDH: 182, within normal limits D-dimer: 0.57, slightly elevated INR: 1 Total CK: 19, low Ferritin: 53 CRP: Elevated at 12.3  Imaging/Diagnostic Tests: No new imaging  Daisy Floro, DO 12/09/2018, 10:38 AM PGY-2, [place] Intern pager: [phone], text pages welcome

## 2018-12-09 NOTE — Progress Notes (Signed)
Patient ID: Katrina Stewart, female   DOB: [birth year], 23 y.o.   MRN: [id number] [place]) NOTE  Katrina Stewart is a 23 y.o. G3P1011 at [redacted]w[redacted]d by best clinical estimate who is admitted for covid pneumonia.   Fetal presentation is unsure. Length of Stay:  1  Days  ASSESSMENT: Active Problems:   [id number] affecting pregnancy in second trimester   PLAN: Continue Remdesivir and Decadron due to O2 requirement. O2 prn--goal 95% or better Check labs and repeat daily according to UpToDate LFTs daily due to Redesivir  CBG monitoring given Decadron, barely passed an early 2 hour and A1C was > 5.7 in early pregnancy  Subjective: Feels slightly better. Tm yesterday 103.  Patient reports the fetal movement as active. Patient reports uterine contraction  activity as none. Patient reports  vaginal bleeding as none. Patient describes fluid per vagina as None.  Vitals:  Blood pressure 132/79, pulse (!) 105, temperature (!) 97.5 F (36.4 C), temperature source Axillary, resp. rate 18, height 5\' 8"  (1.727 m), weight 120 kg, last menstrual period 06/23/2018, SpO2 98 %.  Physical Examination:  General appearance - alert, well appearing, and in no distress Chest - normal effort Abdomen - gravid, non-tender Fundal Height:  size equals dates Extremities: Homans sign is negative, no sign of DVT  Membranes:intact  Fetal Monitoring:  Baseline: 170 bpm  Labs:  Results for orders placed or performed during the hospital encounter of 12/07/18 (from the past 24 hour(s))  CBC   Collection Time: 12/08/18  9:54 AM  Result Value Ref Range   WBC 7.9 4.0 - 10.5 K/uL   RBC 4.66 3.87 - 5.11 MIL/uL   Hemoglobin 10.5 (L) 12.0 - 15.0 g/dL   HCT 33.2 (L) 36.0 - 46.0 %   MCV 71.2 (L) 80.0 - 100.0 fL   MCH 22.5 (L) 26.0 - 34.0 pg   MCHC 31.6 30.0 - 36.0 g/dL   RDW 15.5 11.5 - 15.5 %   Platelets 298 150 - 400 K/uL   nRBC 0.0 0.0 - 0.2 %  Type and screen Katrina Stewart   Collection Time: 12/08/18 12:30 PM  Result Value Ref Range   ABO/RH(D) B NEG    Antibody Screen NEG    Sample Expiration      12/11/2018,2359 Performed at [facility], [address], [place], [place] [address]   Comprehensive metabolic panel   Collection Time: 12/09/18  3:53 AM  Result Value Ref Range   Sodium 137 135 - 145 mmol/L   Potassium 3.5 3.5 - 5.1 mmol/L   Chloride 106 98 - 111 mmol/L   CO2 18 (L) 22 - 32 mmol/L   Glucose, Bld 112 (H) 70 - 99 mg/dL   BUN 5 (L) 6 - 20 mg/dL   Creatinine, Ser 0.66 0.44 - 1.00 mg/dL   Calcium 8.9 8.9 - 10.3 mg/dL   Total Protein 6.4 (L) 6.5 - 8.1 g/dL   Albumin 2.5 (L) 3.5 - 5.0 g/dL   AST 16 15 - 41 U/L   ALT 13 0 - 44 U/L   Alkaline Phosphatase 52 38 - 126 U/L   Total Bilirubin 0.5 0.3 - 1.2 mg/dL   GFR calc non Af Amer >60 >60 mL/min   GFR calc Af Amer >60 >60 mL/min   Anion gap 13 5 - 15    Medications:  Scheduled . aspirin EC  81 mg Oral Daily  . cephALEXin  500 mg Oral TID AC & HS  .  dexamethasone (DECADRON) injection  6 mg Intravenous Q24H  . docusate sodium  100 mg Oral Daily  . enoxaparin (LOVENOX) injection  40 mg Subcutaneous Daily  . prenatal multivitamin  1 tablet Oral Q1200  . sertraline  50 mg Oral Daily   I have reviewed the patient's current medications.   Katrina Jude, MD 12/09/2018,8:29 AM

## 2018-12-10 DIAGNOSIS — Z6791 Unspecified blood type, Rh negative: Secondary | ICD-10-CM

## 2018-12-10 LAB — CBC WITH DIFFERENTIAL/PLATELET
Abs Immature Granulocytes: 0.33 10*3/uL — ABNORMAL HIGH (ref 0.00–0.07)
Basophils Absolute: 0 10*3/uL (ref 0.0–0.1)
Basophils Relative: 0 %
Eosinophils Absolute: 0 10*3/uL (ref 0.0–0.5)
Eosinophils Relative: 0 %
HCT: 32.1 % — ABNORMAL LOW (ref 36.0–46.0)
Hemoglobin: 10.4 g/dL — ABNORMAL LOW (ref 12.0–15.0)
Immature Granulocytes: 4 %
Lymphocytes Relative: 16 %
Lymphs Abs: 1.4 10*3/uL (ref 0.7–4.0)
MCH: 23 pg — ABNORMAL LOW (ref 26.0–34.0)
MCHC: 32.4 g/dL (ref 30.0–36.0)
MCV: 70.9 fL — ABNORMAL LOW (ref 80.0–100.0)
Monocytes Absolute: 1 10*3/uL (ref 0.1–1.0)
Monocytes Relative: 11 %
Neutro Abs: 6 10*3/uL (ref 1.7–7.7)
Neutrophils Relative %: 69 %
Platelets: 358 10*3/uL (ref 150–400)
RBC: 4.53 MIL/uL (ref 3.87–5.11)
RDW: 15.6 % — ABNORMAL HIGH (ref 11.5–15.5)
WBC: 8.7 10*3/uL (ref 4.0–10.5)
nRBC: 0 % (ref 0.0–0.2)

## 2018-12-10 LAB — COMPREHENSIVE METABOLIC PANEL
ALT: 11 U/L (ref 0–44)
AST: 13 U/L — ABNORMAL LOW (ref 15–41)
Albumin: 2.4 g/dL — ABNORMAL LOW (ref 3.5–5.0)
Alkaline Phosphatase: 47 U/L (ref 38–126)
Anion gap: 11 (ref 5–15)
BUN: 6 mg/dL (ref 6–20)
CO2: 22 mmol/L (ref 22–32)
Calcium: 9 mg/dL (ref 8.9–10.3)
Chloride: 104 mmol/L (ref 98–111)
Creatinine, Ser: 0.51 mg/dL (ref 0.44–1.00)
GFR calc Af Amer: >60 mL/min (ref >60)
GFR calc non Af Amer: >60 mL/min (ref >60)
Glucose, Bld: 91 mg/dL (ref 70–99)
Potassium: 3.1 mmol/L — ABNORMAL LOW (ref 3.5–5.1)
Sodium: 137 mmol/L (ref 135–145)
Total Bilirubin: 0.2 mg/dL — ABNORMAL LOW (ref 0.3–1.2)
Total Protein: 6.4 g/dL — ABNORMAL LOW (ref 6.5–8.1)

## 2018-12-10 LAB — GLUCOSE, CAPILLARY
Glucose-Capillary: 103 mg/dL — ABNORMAL HIGH (ref 70–99)
Glucose-Capillary: 89 mg/dL (ref 70–99)

## 2018-12-10 LAB — C-REACTIVE PROTEIN: CRP: 9.4 mg/dL — ABNORMAL HIGH (ref <1.0)

## 2018-12-10 MED ORDER — POTASSIUM CHLORIDE CRYS ER 20 MEQ PO TBCR
40.0000 meq | EXTENDED_RELEASE_TABLET | Freq: Two times a day (BID) | ORAL | Status: AC
  Administered 2018-12-10 (×2): 40 meq via ORAL
  Filled 2018-12-10 (×2): qty 2

## 2018-12-10 NOTE — Progress Notes (Signed)
FACULTY PRACTICE FOLLOW UP CONSULT NOTE  Katrina Stewart is a 23 y.o. G3P1011 at [redacted]w[redacted]d by best clinical estimate who is admitted for COVID pneumonia. Patient is currently doing well on RA, on second day of Remdesivir and Decadron. No acute obstetric concerns.    Length of Stay:  2  Days  ASSESSMENT: Active Problems:   Rh negative state in antepartum period   Chronic hypertension complicating or reason for care during pregnancy, second trimester   COVID-19 virus infection   [redacted] weeks gestation of pregnancy   Shortness of breath   PLAN: Continue Remdesivir and Decadron as per primary team Continue supplemental oxygen as needed--goal 95% or better Follow up labs and CBGs, and manage accordingly Stable BP, continue ASA Will continue daily FHR checks, no acute fetal concerns at this point    Appreciate care of Matalie Matto by her primary team Will continue daily rounding and our Rapid Response OB RN will continue daily FHR assessment until patient is discharged.  After discharge, she will follow up with our office at [facility] for routine prenatal care, already has a scheduled virutal appointment on 12/22/18.  Please call [phone] [facility] OB/GYN Consult Attending Monday-Friday 8am - 5pm) or [phone] [facility] OB/GYN Attending On Call all day, every day) for any obstetric concerns at any time.   Thank you for helping [place] to take care of our patient.  Total consultation time including face-to-face time with patient (>50% of time), reviewing chart and documentation: 15 minutes  Verita Schneiders, MD, [place], [facility] [place] Phone: [phone]    Subjective: Feels much improved.   Patient reports the fetal movement as active. Patient reports uterine contraction  activity as none. Patient reports  vaginal bleeding as none. Patient describes fluid per vagina as none.  Vitals:  Blood pressure  125/84, pulse 99, temperature 97.8 F (36.6 C), temperature source Oral, resp. rate 13, height 5\' 8"  (1.727 m), weight 120 kg, last menstrual period 06/23/2018, SpO2 96 %.  Physical Examination: General appearance - alert, well appearing, and in no distress Chest - normal effort Abdomen - gravid, non-tender Fundal Height:  size equals dates Extremities: Homans sign is negative, no sign of DVT  Membranes:intact  Fetal Monitoring:  Baseline: 172 bpm  Labs:  Results for orders placed or performed during the hospital encounter of 12/07/18 (from the past 24 hour(s))  Glucose, capillary   Collection Time: 12/09/18  1:04 PM  Result Value Ref Range   Glucose-Capillary 83 70 - 99 mg/dL  Glucose, capillary   Collection Time: 12/09/18  6:47 PM  Result Value Ref Range   Glucose-Capillary 116 (H) 70 - 99 mg/dL   Comment 1 Notify RN    Comment 2 Document in Chart   Comprehensive metabolic panel   Collection Time: 12/10/18  4:06 AM  Result Value Ref Range   Sodium 137 135 - 145 mmol/L   Potassium 3.1 (L) 3.5 - 5.1 mmol/L   Chloride 104 98 - 111 mmol/L   CO2 22 22 - 32 mmol/L   Glucose, Bld 91 70 - 99 mg/dL   BUN 6 6 - 20 mg/dL   Creatinine, Ser 0.51 0.44 - 1.00 mg/dL   Calcium 9.0 8.9 - 10.3 mg/dL   Total Protein 6.4 (L) 6.5 - 8.1 g/dL   Albumin 2.4 (L) 3.5 - 5.0 g/dL   AST 13 (L) 15 - 41 U/L   ALT 11 0 - 44  U/L   Alkaline Phosphatase 47 38 - 126 U/L   Total Bilirubin 0.2 (L) 0.3 - 1.2 mg/dL   GFR calc non Af Amer >60 >60 mL/min   GFR calc Af Amer >60 >60 mL/min   Anion gap 11 5 - 15  C-reactive protein   Collection Time: 12/10/18  4:06 AM  Result Value Ref Range   CRP 9.4 (H) <1.0 mg/dL  CBC with Differential/Platelet   Collection Time: 12/10/18  4:06 AM  Result Value Ref Range   WBC 8.7 4.0 - 10.5 K/uL   RBC 4.53 3.87 - 5.11 MIL/uL   Hemoglobin 10.4 (L) 12.0 - 15.0 g/dL   HCT 32.1 (L) 36.0 - 46.0 %   MCV 70.9 (L) 80.0 - 100.0 fL   MCH 23.0 (L) 26.0 - 34.0 pg   MCHC 32.4  30.0 - 36.0 g/dL   RDW 15.6 (H) 11.5 - 15.5 %   Platelets 358 150 - 400 K/uL   nRBC 0.0 0.0 - 0.2 %   Neutrophils Relative % 69 %   Neutro Abs 6.0 1.7 - 7.7 K/uL   Lymphocytes Relative 16 %   Lymphs Abs 1.4 0.7 - 4.0 K/uL   Monocytes Relative 11 %   Monocytes Absolute 1.0 0.1 - 1.0 K/uL   Eosinophils Relative 0 %   Eosinophils Absolute 0.0 0.0 - 0.5 K/uL   Basophils Relative 0 %   Basophils Absolute 0.0 0.0 - 0.1 K/uL   Immature Granulocytes 4 %   Abs Immature Granulocytes 0.33 (H) 0.00 - 0.07 K/uL    Medications:  Scheduled . aspirin EC  81 mg Oral Daily  . cephALEXin  500 mg Oral TID AC & HS  . dexamethasone  6 mg Oral Daily  . docusate sodium  100 mg Oral Daily  . enoxaparin (LOVENOX) injection  40 mg Subcutaneous Daily  . potassium chloride  40 mEq Oral BID  . prenatal multivitamin  1 tablet Oral Q1200  . sertraline  50 mg Oral Daily   I have reviewed the patient's current medications.   Verita Schneiders, MD 12/10/2018,10:10 AM

## 2018-12-10 NOTE — Progress Notes (Signed)
Family Medicine Teaching Service Daily Progress Note Intern Pager: [phone]  Patient name: Katrina Stewart Medical record number: [id number] Date of birth: September 20, 1995 Age: 23 y.o. Gender: female  Primary Care Provider: Patient, No Pcp Per 11/6: Admitted   Assessment and Plan: Katrina Stewart a [id number] at 20w0d23 y.o.femalepresenting with SOB, tachypnea, nausea, vomiting and unable to tolerate PO. 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  COVID-19during second trimester of pregnancy Doing really well from the standpoint.  Now receive day 3 of Decadron, will receive day 3 of remdesivir at 2000 if still admitted.  Off oxygen for around 24 hours at this point.  Will perform ambulatory O2 check to be sure she does not desat.  Goal for her is 95%.  Everything looks good from an obstetric standpoint.  Okay for discharge from [facility] team standpoint when appropriate from a COVID-19 standpoint. -Ambulatory O2 trial, goal to maintain sats greater than 95% -Continue remdesivir, pharmacy to dose, for 5 days or until discharge -Continue Decadron 6 mg daily, to complete a total of 10 days -Appreciate obstetrics recommendations -Continue prenatal vitamin  Chronic hypertension BP 125/84 this a.m.  On aspirin 81 mg.  Will consider Pre-E labs if reaches greater than 0000000 systolic.  Elevated blood sugar during pregnancy Only to monitor CBG closely while getting Decadron.  Barely past 2-hour GTT and A1c was greater than 5.7 in early pregnancy.  Appreciate obstetrics recommendations for this.  Chronic HTN BP this AM 132/ 79 11/ 7. Was diagnosed with chronic HTN during this pregnancy, on Aspirin 81mg .   Continues to deny headaches, vision changes, chest pain. -continue bASA -If blood pressure greater than 160/100 will  order urine protein/labs  Asymptomatic bacteriuria in pregnancy Patient diagnosed w/ pan-sensitive E.coli asymptomatic bacteruria, was prescribed Keflex 500 mg QID x 5 days (11/4-11/8). - Continue Keflex 500 mg QID through 11/8  MDD Home medications include Zoloft 50 mg. -Continue Zoloft 50mg   Insomnia Ambien 5 mg nightly, started by obstetrics team.  We will continue while inpatient.  FEN/GI:Regular diet as tolerated, reglan, phenergan Prophylaxis:lovenox  Disposition: Patient to complete 5 days of remdesivir before discharge, can most likely discharge home pending clinical improvement   Subjective:  Doing well this a.m.  No distress.  Her breakfast was okay but not great.  Not having any shortness of breath at the current time.  Objective: Temp:  [97.8 F (36.6 C)-98.4 F (36.9 C)] 97.8 F (36.6 C) (11/08 0808) Pulse Rate:  [98-110] 99 (11/08 0808) Resp:  [13-20] 13 (11/08 0808) BP: (105-125)/(57-86) 125/84 (11/08 0808) SpO2:  [91 %-97 %] 96 % (11/08 [id number]) Physical Exam: General: 23 year old African-American female, no acute distress, resting comfortably Cardiovascular: Tachycardia, no M/R/G appreciated.  Skin warm and dry Respiratory: Improving breath sounds all lobes, no accessory muscle use, no distress Abdomen: Gravid, nontender abdomen. Extremities: No cyanosis or deformity appreciated, no edema appreciated to bilateral lower extremities, 2+ DP pulses appreciated bilaterally  Laboratory: Recent Labs  Lab 12/07/18 2104 12/08/18 0954 12/10/18 0406  WBC 8.1 7.9 8.7  HGB 11.0* 10.5* 10.4*  HCT 35.0* 33.2* 32.1*  PLT 329 298 358   Recent Labs  Lab 12/08/18 0500 12/09/18 0353 12/10/18 0406  NA 136 137 137  K 3.7 3.5 3.1*  CL 105 106 104  CO2 17* 18* 22  BUN <5* 5* 6  CREATININE 0.68 0.66 0.51  CALCIUM 9.0 8.9 9.0  PROT  --  6.4* 6.4*  BILITOT  --  0.5 0.2*  ALKPHOS  --  52 47  ALT  --  13 11  AST  --  16 13*  GLUCOSE 103* 112* 91     Imaging/Diagnostic Tests: No new imaging  Katrina Dawn, MD 12/10/2018, 11:20 AM PGY-3, [place] Intern pager: [phone], text pages welcome

## 2018-12-10 NOTE — Progress Notes (Signed)
FHR 150 BPM by doppler. Pt denies cramping, vaginal bleeding, or leaking of fluid.

## 2018-12-11 DIAGNOSIS — O10912 Unspecified pre-existing hypertension complicating pregnancy, second trimester: Secondary | ICD-10-CM

## 2018-12-11 DIAGNOSIS — O26899 Other specified pregnancy related conditions, unspecified trimester: Secondary | ICD-10-CM

## 2018-12-11 LAB — COMPREHENSIVE METABOLIC PANEL
ALT: 12 U/L (ref 0–44)
AST: 13 U/L — ABNORMAL LOW (ref 15–41)
Albumin: 2.4 g/dL — ABNORMAL LOW (ref 3.5–5.0)
Alkaline Phosphatase: 45 U/L (ref 38–126)
Anion gap: 11 (ref 5–15)
BUN: 6 mg/dL (ref 6–20)
CO2: 21 mmol/L — ABNORMAL LOW (ref 22–32)
Calcium: 8.9 mg/dL (ref 8.9–10.3)
Chloride: 106 mmol/L (ref 98–111)
Creatinine, Ser: 0.52 mg/dL (ref 0.44–1.00)
GFR calc Af Amer: >60 mL/min (ref >60)
GFR calc non Af Amer: >60 mL/min (ref >60)
Glucose, Bld: 84 mg/dL (ref 70–99)
Potassium: 3.3 mmol/L — ABNORMAL LOW (ref 3.5–5.1)
Sodium: 138 mmol/L (ref 135–145)
Total Bilirubin: 0.4 mg/dL (ref 0.3–1.2)
Total Protein: 6.1 g/dL — ABNORMAL LOW (ref 6.5–8.1)

## 2018-12-11 LAB — CBC WITH DIFFERENTIAL/PLATELET
Abs Immature Granulocytes: 0 10*3/uL (ref 0.00–0.07)
Basophils Absolute: 0 10*3/uL (ref 0.0–0.1)
Basophils Relative: 0 %
Eosinophils Absolute: 0 10*3/uL (ref 0.0–0.5)
Eosinophils Relative: 0 %
HCT: 32.1 % — ABNORMAL LOW (ref 36.0–46.0)
Hemoglobin: 10.1 g/dL — ABNORMAL LOW (ref 12.0–15.0)
Lymphocytes Relative: 26 %
Lymphs Abs: 2.1 10*3/uL (ref 0.7–4.0)
MCH: 22.5 pg — ABNORMAL LOW (ref 26.0–34.0)
MCHC: 31.5 g/dL (ref 30.0–36.0)
MCV: 71.7 fL — ABNORMAL LOW (ref 80.0–100.0)
Monocytes Absolute: 0.8 10*3/uL (ref 0.1–1.0)
Monocytes Relative: 10 %
Neutro Abs: 5.2 10*3/uL (ref 1.7–7.7)
Neutrophils Relative %: 64 %
Platelets: 382 10*3/uL (ref 150–400)
RBC: 4.48 MIL/uL (ref 3.87–5.11)
RDW: 15.5 % (ref 11.5–15.5)
WBC: 8.1 10*3/uL (ref 4.0–10.5)
nRBC: 0 % (ref 0.0–0.2)
nRBC: 0 /100 WBC

## 2018-12-11 LAB — GLUCOSE, CAPILLARY
Glucose-Capillary: 86 mg/dL (ref 70–99)
Glucose-Capillary: 105 mg/dL — ABNORMAL HIGH (ref 70–99)
Glucose-Capillary: 104 mg/dL — ABNORMAL HIGH (ref 70–99)

## 2018-12-11 MED ORDER — POTASSIUM CHLORIDE CRYS ER 20 MEQ PO TBCR: 40.0000 meq | EXTENDED_RELEASE_TABLET | Freq: Once | ORAL | Status: DC

## 2018-12-11 NOTE — Progress Notes (Signed)
Family Medicine Teaching Service Daily Progress Note Intern Pager: [phone]  Patient name: Katrina Stewart Medical record number: [id number] Date of birth: [birth year] Age: 23 y.o. Gender: female  Primary Care Provider: Patient, No Pcp Per 11/6: Admitted   Assessment and Plan: Bionka Belmar is a G3P1011 at 37w0d23 y.o.femalepresenting with SOB, tachypnea, nausea, vomiting and unable to tolerate PO. 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  COVID-19during second trimester of pregnancy This morning patient without complaint.  Reports ongoing sore throat, cough and occasional difficulty breathing..  Patient remains afebrile.  Satting well on room air between 91 to 96%.  Patient has OB follow-up on 12/22/2018.  Patient had desaturation test today of oxygen saturation dropped to 92% with ambulation.  Goal to have 94% on room air with ambulation.  Day 4 of Decadron and day 4 of remdesivir.  Patient denies loss of fluid, contractions and vaginal bleeding.  Feels baby boy moving. -Ambulatory desaturation test, goal 94% on room air -OB following, appreciate recommendations -Follow-up OB appointment 12/22/2018 -Continue remdesivir day 4 of 5 -Continue Decadron day 4 of 10 -Continue prenatal vitamin  Elevated blood sugar during pregnancy CBG this morning fasting glucose 86.  Monitor CBGs on 5 1 Decadron.  Barely past 2-hour GTT and A1c was greater than 5.7 in early pregnancy.   -Appreciate OB recommendation  Chronic HTN BP 119/75 this a.m.  On aspirin 81 mg.  Was diagnosed with chronic HTN during this pregnancy.  Continues to deny headaches, vision changes, chest pain. -continue low dose ASA -If blood pressure greater than 160/100 will order urine protein/labs  Asymptomatic bacteriuria in  pregnancy Patient diagnosed w/ pan-sensitive E.coli asymptomatic bacteruria, was prescribed Keflex 500 mg QID x 5 days (11/4-11/8). -Completed Keflex 500 mg QID  course  MDD Home medications include Zoloft 50 mg. -Continue Zoloft 50mg   Insomnia Ambien 5 mg nightly, started by obstetrics team. -Continue home therapy  FEN/GI:Regular diet as tolerated, reglan, phenergan Prophylaxis:lovenox  Disposition: Home, pending passage of ambulatory desaturation test.   Subjective:  Katrina Stewart there were no significant overnight events.   Objective: Temp:  [97.6 F (36.4 C)-97.8 F (36.6 C)] 97.6 F (36.4 C) (11/09 0819) Pulse Rate:  [86-88] 88 (11/08 2100) Resp:  [16-20] 20 (11/09 0819) BP: (119-126)/(70-78) 119/75 (11/09 0819) SpO2:  [91 %-96 %] 96 % (11/09 0819)  Physical Exam: GEN: pleasant female, in no acute distress  CV: regular rate and rhythm, distal pulses intact RESP: no increased work of breathing, clear to ascultation bilaterally  ABD: Gravid, nontender, bowel sounds present MSK: no lower extremity edema, no cyanosis NEURO: grossly normal, moves all extremities appropriately PSYCH: Normal affect and thought content  Laboratory: Recent Labs  Lab 12/08/18 0954 12/10/18 0406 12/11/18 0544  WBC 7.9 8.7 8.1  HGB 10.5* 10.4* 10.1*  HCT 33.2* 32.1* 32.1*  PLT 298 358 382   Recent Labs  Lab 12/09/18 0353 12/10/18 0406 12/11/18 0544  NA 137 137 138  K 3.5 3.1* 3.3*  CL 106 104 106  CO2 18* 22 21*  BUN 5* 6 6  CREATININE 0.66 0.51 0.52  CALCIUM 8.9 9.0 8.9  PROT 6.4* 6.4* 6.1*  BILITOT 0.5 0.2* 0.4  ALKPHOS 52 47 45  ALT 13 11 12   AST 16 13* 13*  GLUCOSE 112* 91 84    Imaging/Diagnostic Tests: No results found.   Jovian Lembcke,  Ronnette Juniper, MD 12/11/2018, 1:36 PM PGY-3, [place] Intern pager: [phone], text pages welcome

## 2018-12-11 NOTE — Progress Notes (Signed)
Patient on room air. 02 saturation above 95. No signs of distress or discomfort. Tolerating well. Will monitor.

## 2018-12-11 NOTE — Progress Notes (Signed)
SATURATION QUALIFICATIONS: (This note is used to comply with regulatory documentation for home oxygen)  Patient Saturations on Room Air at Rest = 95%  Patient Saturations on Room Air while Ambulating = 92%  Patient was able to ambulate to the bed and bedroom door twice independently. Pt reports no SOB or change in her breathing. Saturations remained above 90.

## 2018-12-11 NOTE — Plan of Care (Signed)

## 2018-12-11 NOTE — Progress Notes (Signed)
FHR 144 BPM by doppler. Denies vaginal bleeding or leaking of fluid. Says she is feeling fetal movement.

## 2018-12-11 NOTE — Progress Notes (Signed)
FACULTY PRACTICE FOLLOW UP CONSULT NOTE  Katrina Stewart is a 23 y.o. G3P1011 at [redacted]w[redacted]d by LMP consistent with early ultrasound who is admitted for COVID pneumonia. Patient is currently doing well on RA, on third day of Remdesivir and Decadron. No acute obstetric concerns.    Length of Stay:  3  Days  ASSESSMENT: Active Problems:   Rh negative state in antepartum period   Chronic hypertension complicating or reason for care during pregnancy, second trimester   COVID-19 virus infection   [redacted] weeks gestation of pregnancy   Shortness of breath   PLAN: No change in obstetric plan for now - Continue Remdesivir and Decadron as per primary team - Continue supplemental oxygen as needed--goal 95% or better - Follow up labs and CBGs, and manage accordingly - Stable BP, continue ASA - Will continue daily FHR checks, no acute fetal concerns at this point   Appreciate care of Katrina Stewart by her primary team Will continue daily rounding and our Rapid Response OB RN will continue daily FHR assessment until patient is discharged. Will defer decision about time of discharge ti primary team.  After discharge, she will follow up with our office at [facility] for routine prenatal care, already has a scheduled virutal appointment on 12/22/18.  Please call [phone] [facility] OB/GYN Consult Attending Monday-Friday 8am - 5pm) or [phone] [facility] OB/GYN Attending On Call all day, every day) for any obstetric concerns at any time.   Thank you for helping [place] to take care of our patient.  Total consultation time including face-to-face time with patient (>50% of time), reviewing chart and documentation: 15 minutes  Katrina Schneiders, MD, [place], [facility] for [place] Phone: [phone]    Subjective: Feels much improved.   Patient reports the fetal movement as active. Patient reports uterine contraction  activity as none.  Patient reports  vaginal bleeding as none. Patient describes fluid per vagina as none.  Vitals:  Blood pressure 121/70, pulse 88, temperature 97.8 F (36.6 C), temperature source Oral, resp. rate 18, height 5\' 8"  (1.727 m), weight 120 kg, last menstrual period 06/23/2018, SpO2 96 %.  Physical Examination: General appearance - alert, well appearing, and in no distress Chest - normal effort Abdomen - gravid, non-tender Fundal Height:  size equals dates Extremities: Homans sign is negative, no sign of DVT  Membranes:intact  Fetal Monitoring:  Baseline: 150 bpm  Labs:  Results for orders placed or performed during the hospital encounter of 12/07/18 (from the past 24 hour(s))  Glucose, capillary   Collection Time: 12/10/18  4:38 PM  Result Value Ref Range   Glucose-Capillary 103 (H) 70 - 99 mg/dL  Glucose, capillary   Collection Time: 12/10/18  9:16 PM  Result Value Ref Range   Glucose-Capillary 89 70 - 99 mg/dL  Comprehensive metabolic panel   Collection Time: 12/11/18  5:44 AM  Result Value Ref Range   Sodium 138 135 - 145 mmol/L   Potassium 3.3 (L) 3.5 - 5.1 mmol/L   Chloride 106 98 - 111 mmol/L   CO2 21 (L) 22 - 32 mmol/L   Glucose, Bld 84 70 - 99 mg/dL   BUN 6 6 - 20 mg/dL   Creatinine, Ser 0.52 0.44 - 1.00 mg/dL   Calcium 8.9 8.9 - 10.3 mg/dL   Total Protein 6.1 (L) 6.5 - 8.1 g/dL   Albumin 2.4 (L) 3.5 - 5.0 g/dL   AST 13 (L) 15 -  41 U/L   ALT 12 0 - 44 U/L   Alkaline Phosphatase 45 38 - 126 U/L   Total Bilirubin 0.4 0.3 - 1.2 mg/dL   GFR calc non Af Amer >60 >60 mL/min   GFR calc Af Amer >60 >60 mL/min   Anion gap 11 5 - 15  CBC with Differential/Platelet   Collection Time: 12/11/18  5:44 AM  Result Value Ref Range   WBC 8.1 4.0 - 10.5 K/uL   RBC 4.48 3.87 - 5.11 MIL/uL   Hemoglobin 10.1 (L) 12.0 - 15.0 g/dL   HCT 32.1 (L) 36.0 - 46.0 %   MCV 71.7 (L) 80.0 - 100.0 fL   MCH 22.5 (L) 26.0 - 34.0 pg   MCHC 31.5 30.0 - 36.0 g/dL   RDW 15.5 11.5 - 15.5 %    Platelets 382 150 - 400 K/uL   nRBC 0.0 0.0 - 0.2 %   Neutrophils Relative % 64 %   Neutro Abs 5.2 1.7 - 7.7 K/uL   Lymphocytes Relative 26 %   Lymphs Abs 2.1 0.7 - 4.0 K/uL   Monocytes Relative 10 %   Monocytes Absolute 0.8 0.1 - 1.0 K/uL   Eosinophils Relative 0 %   Eosinophils Absolute 0.0 0.0 - 0.5 K/uL   Basophils Relative 0 %   Basophils Absolute 0.0 0.0 - 0.1 K/uL   nRBC 0 0 /100 WBC   Abs Immature Granulocytes 0.00 0.00 - 0.07 K/uL   Polychromasia PRESENT    Ovalocytes PRESENT     Medications:  Scheduled . aspirin EC  81 mg Oral Daily  . dexamethasone  6 mg Oral Daily  . docusate sodium  100 mg Oral Daily  . enoxaparin (LOVENOX) injection  40 mg Subcutaneous Daily  . potassium chloride  40 mEq Oral Once  . prenatal multivitamin  1 tablet Oral Q1200  . sertraline  50 mg Oral Daily   I have reviewed the patient's current medications.   Katrina Schneiders, MD 12/11/2018,7:55 AM

## 2018-12-12 LAB — COMPREHENSIVE METABOLIC PANEL
ALT: 15 U/L (ref 0–44)
AST: 16 U/L (ref 15–41)
Albumin: 2.4 g/dL — ABNORMAL LOW (ref 3.5–5.0)
Alkaline Phosphatase: 48 U/L (ref 38–126)
Anion gap: 9 (ref 5–15)
BUN: 6 mg/dL (ref 6–20)
CO2: 22 mmol/L (ref 22–32)
Calcium: 9 mg/dL (ref 8.9–10.3)
Chloride: 107 mmol/L (ref 98–111)
Creatinine, Ser: 0.52 mg/dL (ref 0.44–1.00)
GFR calc Af Amer: >60 mL/min (ref >60)
GFR calc non Af Amer: >60 mL/min (ref >60)
Glucose, Bld: 84 mg/dL (ref 70–99)
Potassium: 3.2 mmol/L — ABNORMAL LOW (ref 3.5–5.1)
Sodium: 138 mmol/L (ref 135–145)
Total Bilirubin: 0.4 mg/dL (ref 0.3–1.2)
Total Protein: 5.9 g/dL — ABNORMAL LOW (ref 6.5–8.1)

## 2018-12-12 LAB — GLUCOSE, CAPILLARY
Glucose-Capillary: 72 mg/dL (ref 70–99)
Glucose-Capillary: 92 mg/dL (ref 70–99)

## 2018-12-12 LAB — MAGNESIUM: Magnesium: 1.6 mg/dL — ABNORMAL LOW (ref 1.7–2.4)

## 2018-12-12 MED ORDER — DEXAMETHASONE 6 MG PO TABS: 6.0000 mg | ORAL_TABLET | Freq: Every day | ORAL | 0 refills | Status: AC

## 2018-12-12 MED ORDER — POTASSIUM CHLORIDE CRYS ER 20 MEQ PO TBCR
40.0000 meq | EXTENDED_RELEASE_TABLET | ORAL | Status: AC
  Administered 2018-12-12 (×2): 40 meq via ORAL
  Filled 2018-12-12 (×2): qty 2

## 2018-12-12 MED ORDER — MAGNESIUM SULFATE 50 % IJ SOLN
2.0000 g | Freq: Once | INTRAVENOUS | Status: AC
  Administered 2018-12-12: 2 g via INTRAVENOUS
  Filled 2018-12-12: qty 4

## 2018-12-12 NOTE — Discharge Instructions (Addendum)
You were hospitalized at [facility] due to shortness of breath.  We expect this is from [place] 19  which improved after oxygent, steroids and remdesivir (anti-viral) therapies.  We are so glad you are feeling better.  Be sure to follow-up with your regularly scheduled OB  appointments.  Please also be sure to follow-up with your PCP at your earliest convenience.  Thank you for allowing [place] to take care of you.  Regarding your quarantine: For most persons with COVID-19 illness, isolation and precautions can generally be discontinued 10 days after symptom onset and no fever for at least 24 hours, without the use of fever-reducing medications, and with improvement of other symptoms.  You should be able to end your quarantine on 12/13/18 as long as your remain without a fever.   Take care, [facility] family medicine team

## 2018-12-12 NOTE — Progress Notes (Signed)
SATURATION QUALIFICATIONS: (This note is used to comply with regulatory documentation for home oxygen)  Patient Saturations on Room Air at Rest = 97%  Patient Saturations on Room Air while Ambulating = 96-93%  Patient ambulate around room. O2 sat ranged from 93-97 % on room air. Pt reports no distress ambulated independently.

## 2018-12-12 NOTE — Progress Notes (Addendum)
Obstetrics Daily Consult Note  Admission Date: 12/07/2018 Current Date: 12/12/2018 9:18 AM  Katrina Stewart is a 23 y.o. G3P1011 @ [redacted]w[redacted]d , HD#6, admitted for COVID19 in pregnancy meeting inpatient criteria.  Pregnancy complicated by: Patient Active Problem List   Diagnosis Date Noted  . [redacted] weeks gestation of pregnancy   . Shortness of breath   . UTI (urinary tract infection) 12/07/2018  . COVID-19 virus infection 12/05/2018  . Chronic hypertension complicating or reason for care during pregnancy, second trimester 11/20/2018  . Maternal obesity affecting pregnancy, antepartum 09/14/2018  . Supervision of high risk pregnancy, antepartum 08/29/2018  . History of depression 08/29/2018  . Rh negative state in antepartum period 09/03/2015  . Rubella non-immune status, antepartum 09/03/2015  . Severe episode of recurrent major depressive disorder, without psychotic features ([place])   . Alcohol use disorder     Overnight/24hr events:  none  Subjective:  No s/s of preterm labor and no fevers, chills, chest pain, sob. +cough productive of some clear-yellow phlegm.   Objective:     Current Vital Signs 24h Vital Sign Ranges  T 97.7 F (36.5 C) Temp  Avg: 96.9 F (36.1 C)  Min: 96.1 F (35.6 C)  Max: 97.8 F (36.6 C)  BP 117/69 BP  Min: 110/62  Max: 117/69  HR 81 Pulse  Avg: 84.7  Min: 81  Max: 92  RR 20 Resp  Avg: 19  Min: 18  Max: 20  SaO2 98 % Room Air SpO2  Avg: 98 %  Min: 97 %  Max: 99 %       24 Hour I/O Current Shift I/O  Time Ins Outs No intake/output data recorded. No intake/output data recorded.   Patient Vitals for the past 24 hrs:  BP Temp Temp src Pulse Resp SpO2  12/12/18 0812 117/69 97.7 F (36.5 C) Oral - 20 98 %  12/12/18 0122 110/62 (!) 96.1 F (35.6 C) Oral 81 - 99 %  12/12/18 0107 110/62 (!) 96.1 F (35.6 C) - 81 - 97 %  12/11/18 1559 116/79 97.8 F (36.6 C) Oral 92 18 98 %   Fetal heart tones: normal on 11/9  Physical exam: General: Well nourished,  well developed female in no acute distress. Abdomen: gravid, nttp Cardiovascular: S1, S2 normal, no murmur, rub or gallop, regular rate and rhythm Respiratory: CTAB Extremities: no clubbing, cyanosis or edema Skin: Warm and dry.   Medications: Current Facility-Administered Medications  Medication Dose Route Frequency Provider Last Rate Last Dose  . acetaminophen (TYLENOL) tablet 1,000 mg  1,000 mg Oral Q6H PRN Kathrene Alu, MD   1,000 mg at 12/12/18 0839  . acetaminophen (TYLENOL) tablet 650 mg  650 mg Oral Q4H PRN [name], MD   650 mg at 12/11/18 2020  . aspirin EC tablet 81 mg  81 mg Oral Daily Kathrene Alu, MD   81 mg at 12/12/18 0839  . calcium carbonate (TUMS - dosed in mg elemental calcium) chewable tablet 400 mg of elemental calcium  2 tablet Oral Q4H PRN [name], MD      . dexamethasone (DECADRON) tablet 6 mg  6 mg Oral Daily Milus Banister C, DO   6 mg at 12/12/18 0840  . docusate sodium (COLACE) capsule 100 mg  100 mg Oral Daily [name], MD   100 mg at 12/12/18 [id number]  . enoxaparin (LOVENOX) injection 40 mg  40 mg Subcutaneous Daily Winfrey, Alcario Drought, MD   40 mg at  12/12/18 0840  . metoCLOPramide (REGLAN) tablet 10 mg  10 mg Oral Q8H PRN Kathrene Alu, MD      . ondansetron (ZOFRAN) injection 4 mg  4 mg Intravenous Q6H PRN Kathrene Alu, MD   4 mg at 12/08/18 1409  . phenol (CHLORASEPTIC) mouth spray 1 spray  1 spray Mouth/Throat PRN Martyn Malay, MD      . potassium chloride SA (KLOR-CON) CR tablet 40 mEq  40 mEq Oral Q4H Carollee Leitz, MD   40 mEq at 12/12/18 0840  . prenatal multivitamin tablet 1 tablet  1 tablet Oral Q1200 [name], MD   1 tablet at 12/11/18 1159  . remdesivir 100 mg in sodium chloride 0.9 % 250 mL IVPB  100 mg Intravenous Q24H Martyn Malay, MD 500 mL/hr at 12/11/18 2020 100 mg at 12/11/18 2020  . sertraline (ZOLOFT) tablet 50 mg  50 mg Oral Daily Kathrene Alu, MD   50 mg at 12/12/18 0840  . zolpidem  (AMBIEN) tablet 5 mg  5 mg Oral QHS PRN [name], MD   5 mg at 12/08/18 2120    Labs:  Recent Labs  Lab 12/08/18 0954 12/10/18 0406 12/11/18 0544  WBC 7.9 8.7 8.1  HGB 10.5* 10.4* 10.1*  HCT 33.2* 32.1* 32.1*  PLT 298 358 382    Recent Labs  Lab 12/10/18 0406 12/11/18 0544 12/12/18 0502  NA 137 138 138  K 3.1* 3.3* 3.2*  CL 104 106 107  CO2 22 21* 22  BUN 6 6 6   CREATININE 0.51 0.52 0.52  CALCIUM 9.0 8.9 9.0  PROT 6.4* 6.1* 5.9*  BILITOT 0.2* 0.4 0.4  ALKPHOS 47 45 48  ALT 11 12 15   AST 13* 13* 16  GLUCOSE 91 84 84   Mg 1.6  Radiology: no new imaging  Assessment & Plan:  Pt doing well *Pregnancy: no current issues. RN to continue with qday fetal heart tones today.  *ID: appreciate care from [facility] team. Patient able for d/c to home when able to maintain O2 stats at 95 or greater while at rest and with ambulation and only on RA. Pt didn't meet that criteria yesterday with ambulation and she states they are about to get her up today and see.  *Preterm: no issues *PPx: lovenox *Dispo: see above. Hopefully today. Has virtual f/u OB visit for 11/20 already  Durene Romans MD Attending Center for [facility]) GYN Consult Phone: [phone] (M-F, [phone]) & [phone] (Off hours, weekends, holidays)

## 2018-12-12 NOTE — Care Management (Signed)
Pt to discharge home today.  Pt confirms she was independent from home PTA.  Pt's mom will transport her home via private vehicle.  Pt has PCP and denied barriers with paying for prescriptions.  Pt confirms she has a thermometer in the home (COVID positive).  No CM needs - CM signing off

## 2018-12-12 NOTE — Progress Notes (Signed)
In to see patient. Patient had low O2 recorded with nursing with ambulation, nursing reports some poorer wave forms at times.   Walked patient in 4 laps in room. O2 saturation between 95-98% with ambulation, no low values. She has been at ~97% or higher on room air for >24 hours when resting.  Will plan for discharge.   Dorris Singh, MD  Family Medicine Teaching Service

## 2018-12-22 ENCOUNTER — Telehealth (INDEPENDENT_AMBULATORY_CARE_PROVIDER_SITE_OTHER): Payer: Medicaid Other | Admitting: Obstetrics & Gynecology

## 2018-12-22 ENCOUNTER — Other Ambulatory Visit: Payer: Self-pay

## 2018-12-22 DIAGNOSIS — O99212 Obesity complicating pregnancy, second trimester: Secondary | ICD-10-CM | POA: Diagnosis not present

## 2018-12-22 DIAGNOSIS — Z3A26 26 weeks gestation of pregnancy: Secondary | ICD-10-CM

## 2018-12-22 DIAGNOSIS — Z8659 Personal history of other mental and behavioral disorders: Secondary | ICD-10-CM

## 2018-12-22 DIAGNOSIS — O10912 Unspecified pre-existing hypertension complicating pregnancy, second trimester: Secondary | ICD-10-CM

## 2018-12-22 DIAGNOSIS — O0992 Supervision of high risk pregnancy, unspecified, second trimester: Secondary | ICD-10-CM

## 2018-12-22 DIAGNOSIS — O0990 Supervision of high risk pregnancy, unspecified, unspecified trimester: Secondary | ICD-10-CM

## 2018-12-22 DIAGNOSIS — O99210 Obesity complicating pregnancy, unspecified trimester: Secondary | ICD-10-CM

## 2018-12-22 NOTE — Progress Notes (Signed)
I connected with  Katrina Stewart on 12/22/18 at  9:55 AM EST by telephone and verified that I am speaking with the correct person using two identifiers.   I discussed the limitations, risks, security and privacy concerns of performing an evaluation and management service by telephone and the availability of in person appointments. I also discussed with the patient that there may be a patient responsible charge related to this service. The patient expressed understanding and agreed to proceed.  Bethanne Ginger, CMA 12/22/2018  10:10 AM

## 2018-12-22 NOTE — Progress Notes (Signed)
   TELEHEALTH VIRTUAL OBSTETRICS VISIT ENCOUNTER NOTE  I connected with Katrina Stewart on 12/22/18 at  9:55 AM EST by telephone at home and verified that I am speaking with the correct person using two identifiers.   I discussed the limitations, risks, security and privacy concerns of performing an evaluation and management service by telephone and the availability of in person appointments. I also discussed with the patient that there may be a patient responsible charge related to this service. The patient expressed understanding and agreed to proceed.  Subjective:  Katrina Stewart is a 23 y.o. G3P1011 at [redacted]w[redacted]d being followed for ongoing prenatal care.  She is currently monitored for the following issues for this high-risk pregnancy and has Alcohol use disorder; Severe episode of recurrent major depressive disorder, without psychotic features ([place]); Rh negative state in antepartum period; Rubella non-immune status, antepartum; Supervision of high risk pregnancy, antepartum; History of depression; Maternal obesity affecting pregnancy, antepartum; Chronic hypertension complicating or reason for care during pregnancy, second trimester; COVID-19 virus infection; UTI (urinary tract infection); [redacted] weeks gestation of pregnancy; and Shortness of breath on their problem list.  Patient reports no complaints. Reports fetal movement. Denies any contractions, bleeding or leaking of fluid.   The following portions of the patient's history were reviewed and updated as appropriate: allergies, current medications, past family history, past medical history, past social history, past surgical history and problem list.   Objective:   General:  Alert, oriented and cooperative.   Mental Status: Normal mood and affect perceived. Normal judgment and thought content.  Rest of physical exam deferred due to type of encounter  Assessment and Plan:  Pregnancy: G3P1011 at [redacted]w[redacted]d 1. Chronic hypertension complicating or  reason for care during pregnancy, second trimester BP is stable and she will continue her ASA  2. Maternal obesity affecting pregnancy, antepartum There is no height or weight on file to calculate BMI.   3. Supervision of high risk pregnancy, antepartum F/U US in 1-2 weeks  4. History of depression Zoloft  Preterm labor symptoms and general obstetric precautions including but not limited to vaginal bleeding, contractions, leaking of fluid and fetal movement were reviewed in detail with the patient.  I discussed the assessment and treatment plan with the patient. The patient was provided an opportunity to ask questions and all were answered. The patient agreed with the plan and demonstrated an understanding of the instructions. The patient was advised to call back or seek an in-person office evaluation/go to MAU at [facility] for any urgent or concerning symptoms. Please refer to After Visit Summary for other counseling recommendations.   I provided 11 minutes of non-face-to-face time during this encounter.  Return in about 2 weeks (around 01/05/2019) for in person 2 hr.  Future Appointments  Date Time Provider [place]  12/27/2018  4:15 PM Donnamae Jude, MD WOC-WOCA WOC  01/02/2019  1:15 PM [place] NURSE [place] MFC-US  01/02/2019  1:15 PM [place]    Emeterio Reeve, [place] for [facility], [place]

## 2018-12-22 NOTE — Patient Instructions (Signed)

## 2018-12-27 ENCOUNTER — Telehealth: Payer: Medicaid Other | Admitting: Family Medicine

## 2019-01-02 ENCOUNTER — Other Ambulatory Visit: Payer: Self-pay

## 2019-01-02 ENCOUNTER — Ambulatory Visit (HOSPITAL_COMMUNITY)
Admission: RE | Admit: 2019-01-02 | Discharge: 2019-01-02 | Disposition: A | Discharge status: Home / Self Care | Payer: Medicaid Other | Source: Ambulatory Visit | Attending: Obstetrics and Gynecology | Admitting: Obstetrics and Gynecology

## 2019-01-02 ENCOUNTER — Encounter (HOSPITAL_COMMUNITY): Payer: Self-pay

## 2019-01-02 ENCOUNTER — Other Ambulatory Visit (HOSPITAL_COMMUNITY): Payer: Self-pay | Admitting: *Deleted

## 2019-01-02 ENCOUNTER — Ambulatory Visit (HOSPITAL_COMMUNITY): Payer: Medicaid Other | Admitting: *Deleted

## 2019-01-02 DIAGNOSIS — O0990 Supervision of high risk pregnancy, unspecified, unspecified trimester: Secondary | ICD-10-CM | POA: Insufficient documentation

## 2019-01-02 DIAGNOSIS — O10912 Unspecified pre-existing hypertension complicating pregnancy, second trimester: Secondary | ICD-10-CM | POA: Insufficient documentation

## 2019-01-02 DIAGNOSIS — O10012 Pre-existing essential hypertension complicating pregnancy, second trimester: Secondary | ICD-10-CM | POA: Diagnosis not present

## 2019-01-02 DIAGNOSIS — Z8659 Personal history of other mental and behavioral disorders: Secondary | ICD-10-CM | POA: Diagnosis not present

## 2019-01-02 DIAGNOSIS — Z3A27 27 weeks gestation of pregnancy: Secondary | ICD-10-CM

## 2019-01-02 DIAGNOSIS — O99312 Alcohol use complicating pregnancy, second trimester: Secondary | ICD-10-CM | POA: Diagnosis not present

## 2019-01-02 DIAGNOSIS — O99212 Obesity complicating pregnancy, second trimester: Secondary | ICD-10-CM

## 2019-01-02 DIAGNOSIS — O99210 Obesity complicating pregnancy, unspecified trimester: Secondary | ICD-10-CM | POA: Diagnosis not present

## 2019-01-02 DIAGNOSIS — O10919 Unspecified pre-existing hypertension complicating pregnancy, unspecified trimester: Secondary | ICD-10-CM

## 2019-01-02 DIAGNOSIS — Z362 Encounter for other antenatal screening follow-up: Secondary | ICD-10-CM | POA: Diagnosis not present

## 2019-01-02 DIAGNOSIS — O360120 Maternal care for anti-D [Rh] antibodies, second trimester, not applicable or unspecified: Secondary | ICD-10-CM | POA: Diagnosis not present

## 2019-01-05 ENCOUNTER — Other Ambulatory Visit: Payer: Medicaid Other

## 2019-01-05 ENCOUNTER — Other Ambulatory Visit: Payer: Self-pay

## 2019-01-05 DIAGNOSIS — O0990 Supervision of high risk pregnancy, unspecified, unspecified trimester: Secondary | ICD-10-CM | POA: Diagnosis not present

## 2019-01-06 LAB — CBC
Hematocrit: 32.8 % — ABNORMAL LOW (ref 34.0–46.6)
Hemoglobin: 10.6 g/dL — ABNORMAL LOW (ref 11.1–15.9)
MCH: 23.4 pg — ABNORMAL LOW (ref 26.6–33.0)
MCHC: 32.3 g/dL (ref 31.5–35.7)
MCV: 72 fL — ABNORMAL LOW (ref 79–97)
Platelets: 270 10*3/uL (ref 150–450)
RBC: 4.53 x10E6/uL (ref 3.77–5.28)
RDW: 16.6 % — ABNORMAL HIGH (ref 11.7–15.4)
WBC: 6.3 10*3/uL (ref 3.4–10.8)

## 2019-01-06 LAB — RPR: RPR Ser Ql: NONREACTIVE

## 2019-01-06 LAB — GLUCOSE TOLERANCE, 2 HOURS W/ 1HR
Glucose, 1 hour: 141 mg/dL (ref 65–179)
Glucose, 2 hour: 113 mg/dL (ref 65–152)
Glucose, Fasting: 96 mg/dL — ABNORMAL HIGH (ref 65–91)

## 2019-01-06 LAB — HIV ANTIBODY (ROUTINE TESTING W REFLEX): HIV Screen 4th Generation wRfx: NONREACTIVE

## 2019-01-08 ENCOUNTER — Other Ambulatory Visit: Payer: Self-pay

## 2019-01-08 ENCOUNTER — Ambulatory Visit (INDEPENDENT_AMBULATORY_CARE_PROVIDER_SITE_OTHER): Payer: Medicaid Other | Admitting: Obstetrics & Gynecology

## 2019-01-08 VITALS — BP 125/81 | HR 106 | Wt 272.0 lb

## 2019-01-08 DIAGNOSIS — Z3A28 28 weeks gestation of pregnancy: Secondary | ICD-10-CM | POA: Diagnosis not present

## 2019-01-08 DIAGNOSIS — O360130 Maternal care for anti-D [Rh] antibodies, third trimester, not applicable or unspecified: Secondary | ICD-10-CM | POA: Diagnosis not present

## 2019-01-08 DIAGNOSIS — O0993 Supervision of high risk pregnancy, unspecified, third trimester: Secondary | ICD-10-CM | POA: Diagnosis not present

## 2019-01-08 DIAGNOSIS — O24419 Gestational diabetes mellitus in pregnancy, unspecified control: Secondary | ICD-10-CM | POA: Diagnosis not present

## 2019-01-08 DIAGNOSIS — O10912 Unspecified pre-existing hypertension complicating pregnancy, second trimester: Secondary | ICD-10-CM

## 2019-01-08 DIAGNOSIS — O10913 Unspecified pre-existing hypertension complicating pregnancy, third trimester: Secondary | ICD-10-CM

## 2019-01-08 DIAGNOSIS — Z23 Encounter for immunization: Secondary | ICD-10-CM | POA: Diagnosis not present

## 2019-01-08 DIAGNOSIS — O0990 Supervision of high risk pregnancy, unspecified, unspecified trimester: Secondary | ICD-10-CM

## 2019-01-08 MED ORDER — RHO D IMMUNE GLOBULIN 1500 UNIT/2ML IJ SOSY
300.0000 ug | PREFILLED_SYRINGE | Freq: Once | INTRAMUSCULAR | Status: AC
  Administered 2019-01-08: 300 ug via INTRAMUSCULAR

## 2019-01-08 MED ORDER — ACCU-CHEK GUIDE W/DEVICE KIT: 1.0000 | PACK | Freq: Every day | 0 refills | Status: DC

## 2019-01-08 MED ORDER — ACCU-CHEK FASTCLIX LANCETS MISC: 1.0000 | Freq: Four times a day (QID) | 6 refills | Status: DC

## 2019-01-08 MED ORDER — ACCU-CHEK GUIDE VI STRP: ORAL_STRIP | 12 refills | Status: DC

## 2019-01-08 NOTE — Patient Instructions (Signed)
Gestational Diabetes Mellitus, Diagnosis Gestational diabetes (gestational diabetes mellitus) is a short-term (temporary) form of diabetes that can happen during pregnancy. It goes away after you give birth. It may be caused by one or both of these problems:  Your pancreas does not make enough of a hormone called insulin.  Your body does not respond in a normal way to insulin that it makes. Insulin lets sugars (glucose) go into cells in the body. This gives you energy. If you have diabetes, sugars cannot get into cells. This causes high blood sugar (hyperglycemia). If you get gestational diabetes, you are:  More likely to get it if you get pregnant again.  More likely to develop type 2 diabetes in the future. If gestational diabetes is treated, it may not hurt you or your baby. Your doctor will set treatment goals for you. In general, you should have these blood sugar levels:  After not eating for a long time (fasting): 95 mg/dL (5.3 mmol/L).  After meals (postprandial): ? One hour after a meal: at or below 140 mg/dL (7.8 mmol/L). ? Two hours after a meal: at or below 120 mg/dL (6.7 mmol/L).  A1c (hemoglobin A1c) level: 6-6.5%. Follow these instructions at home: Questions to ask your doctor   You may want to ask these questions: ? Do I need to meet with a diabetes educator? ? What equipment will I need to care for myself at home? ? What medicines do I need? When should I take them? ? How often do I need to check my blood sugar? ? What number can I call if I have questions? ? When is my next doctor's visit? General instructions  Take over-the-counter and prescription medicines only as told by your doctor.  Stay at a healthy weight during pregnancy.  Keep all follow-up visits as told by your doctor. This is important. Contact a doctor if:  Your blood sugar is at or above 240 mg/dL (13.3 mmol/L).  Your blood sugar is at or above 200 mg/dL (11.1 mmol/L) and you have ketones in  your pee (urine).  You have been sick or have had a fever for 2 days or more and you are not getting better.  You have any of these problems for more than 6 hours: ? You cannot eat or drink. ? You feel sick to your stomach (nauseous). ? You throw up (vomit). ? You have watery poop (diarrhea). Get help right away if:  Your blood sugar is lower than 54 mg/dL (3 mmol/L).  You get confused.  You have trouble: ? Thinking clearly. ? Breathing.  Your baby moves less than normal.  You have any of these: ? Moderate or large ketone levels in your pee. ? Blood coming from your vagina. ? Unusual fluid coming from your vagina. ? Early contractions. These may feel like tightness in your belly. Summary  Gestational diabetes is a short-term form of diabetes. It can happen while you are pregnant. It goes away after you give birth.  If gestational diabetes is treated, it may not hurt you or your baby. Your doctor will set treatment goals for you.  Keep all follow-up visits as told by your doctor. This is important. This information is not intended to replace advice given to you by your health care provider. Make sure you discuss any questions you have with your health care provider. Document Released: 05/12/2015 Document Revised: 02/24/2017 Document Reviewed: 02/21/2015 Elsevier Patient Education  2020 [employer].

## 2019-01-08 NOTE — Progress Notes (Signed)
   PRENATAL VISIT NOTE  Subjective:  Katrina Stewart is a 23 y.o. G3P1011 at 61w3dbeing seen today for ongoing prenatal care.  She is currently monitored for the following issues for this high-risk pregnancy and has Alcohol use disorder; Severe episode of recurrent major depressive disorder, without psychotic features ([place]ostoria; Rh negative state in antepartum period; Rubella non-immune status, antepartum; Supervision of high risk pregnancy, antepartum; History of depression; Maternal obesity affecting pregnancy, antepartum; Chronic hypertension complicating or reason for care during pregnancy, second trimester; COVID-19 virus infection; UTI (urinary tract infection); Shortness of breath; and Gestational diabetes mellitus (GDM), antepartum on their problem list.  Patient reports no complaints.  Contractions: Not present. Vag. Bleeding: None.  Movement: Present. Denies leaking of fluid.   The following portions of the patient's history were reviewed and updated as appropriate: allergies, current medications, past family history, past medical history, past social history, past surgical history and problem list.   Objective:   Vitals:   01/08/19 1025  BP: 125/81  Pulse: (!) 106  Weight: 272 lb (123.4 kg)    Fetal Status: Fetal Heart Rate (bpm): 142   Movement: Present     General:  Alert, oriented and cooperative. Patient is in no acute distress.  Skin: Skin is warm and dry. No rash noted.   Cardiovascular: Normal heart rate noted  Respiratory: Normal respiratory effort, no problems with respiration noted  Abdomen: Soft, gravid, appropriate for gestational age.  Pain/Pressure: Present     Pelvic: Cervical exam deferred        Extremities: Normal range of motion.  Edema: Trace  Mental Status: Normal mood and affect. Normal behavior. Normal judgment and thought content.   Assessment and Plan:  Pregnancy: G3P1011 at 2103w3d. Chronic hypertension complicating or reason for care during  pregnancy, second trimester Failed 2 hr and needs diabetes education - Antibody screen; Future - Blood Glucose Monitoring Suppl (ACCU-CHEK GUIDE) w/Device KIT; 1 Device by Does not apply route daily. Use as directed  Dispense: 1 kit; Refill: 0 - Accu-Chek FastClix Lancets MISC; 1 Device by Percutaneous route 4 (four) times daily.  Dispense: 100 each; Refill: 6 - glucose blood (ACCU-CHEK GUIDE) test strip; Use as instructed  Dispense: 100 each; Refill: 12  2. Gestational diabetes mellitus (GDM) in second trimester, gestational diabetes method of control unspecified  - Blood Glucose Monitoring Suppl (ACCU-CHEK GUIDE) w/Device KIT; 1 Device by Does not apply route daily. Use as directed  Dispense: 1 kit; Refill: 0 - Accu-Chek FastClix Lancets MISC; 1 Device by Percutaneous route 4 (four) times daily.  Dispense: 100 each; Refill: 6 - glucose blood (ACCU-CHEK GUIDE) test strip; Use as instructed  Dispense: 100 each; Refill: 12  3. Supervision of high risk pregnancy, antepartum Rh neg - Antibody screen  Preterm labor symptoms and general obstetric precautions including but not limited to vaginal bleeding, contractions, leaking of fluid and fetal movement were reviewed in detail with the patient. Please refer to After Visit Summary for other counseling recommendations.   Return in about 2 weeks (around 01/22/2019), or also needs diabetes education, for virtual.  Future Appointments  Date Time Provider [place]12/21/2020  8:35 AM DaSloan LeiterMD [facility]12/22/2020 10:15 AM [place]1/06/2019  1:15 PM WHNisswa[place] WH-MFCUS MFC-US  02/06/2019  1:20 PM WH-MFC NURSE WH-MFC MFC-US    JaEmeterio ReeveMD

## 2019-01-13 ENCOUNTER — Encounter (HOSPITAL_COMMUNITY): Payer: Self-pay | Admitting: Family Medicine

## 2019-01-13 ENCOUNTER — Other Ambulatory Visit: Payer: Self-pay

## 2019-01-13 ENCOUNTER — Inpatient Hospital Stay (HOSPITAL_COMMUNITY)
Admission: AD | Admit: 2019-01-13 | Discharge: 2019-01-13 | Disposition: A | Discharge status: Home / Self Care | Payer: Medicaid Other | Attending: Family Medicine | Admitting: Family Medicine

## 2019-01-13 DIAGNOSIS — O10913 Unspecified pre-existing hypertension complicating pregnancy, third trimester: Secondary | ICD-10-CM | POA: Insufficient documentation

## 2019-01-13 DIAGNOSIS — O10912 Unspecified pre-existing hypertension complicating pregnancy, second trimester: Secondary | ICD-10-CM

## 2019-01-13 DIAGNOSIS — Z3A29 29 weeks gestation of pregnancy: Secondary | ICD-10-CM | POA: Diagnosis not present

## 2019-01-13 DIAGNOSIS — Z7982 Long term (current) use of aspirin: Secondary | ICD-10-CM | POA: Insufficient documentation

## 2019-01-13 DIAGNOSIS — O26853 Spotting complicating pregnancy, third trimester: Secondary | ICD-10-CM | POA: Diagnosis not present

## 2019-01-13 DIAGNOSIS — Z79899 Other long term (current) drug therapy: Secondary | ICD-10-CM | POA: Diagnosis not present

## 2019-01-13 DIAGNOSIS — M549 Dorsalgia, unspecified: Secondary | ICD-10-CM | POA: Diagnosis not present

## 2019-01-13 DIAGNOSIS — O26893 Other specified pregnancy related conditions, third trimester: Secondary | ICD-10-CM | POA: Diagnosis not present

## 2019-01-13 DIAGNOSIS — R109 Unspecified abdominal pain: Secondary | ICD-10-CM | POA: Diagnosis not present

## 2019-01-13 DIAGNOSIS — O99891 Other specified diseases and conditions complicating pregnancy: Secondary | ICD-10-CM | POA: Diagnosis not present

## 2019-01-13 LAB — URINALYSIS, ROUTINE W REFLEX MICROSCOPIC
Bilirubin Urine: NEGATIVE
Glucose, UA: NEGATIVE mg/dL
Hgb urine dipstick: NEGATIVE
Ketones, ur: NEGATIVE mg/dL
Leukocytes,Ua: NEGATIVE
Nitrite: NEGATIVE
Protein, ur: NEGATIVE mg/dL
Specific Gravity, Urine: 1.011 (ref 1.005–1.030)
pH: 7 (ref 5.0–8.0)

## 2019-01-13 MED ORDER — COMFORT FIT MATERNITY SUPP MED MISC: 1.0000 | Freq: Every day | 0 refills | Status: DC

## 2019-01-13 NOTE — Discharge Instructions (Signed)
   PREGNANCY SUPPORT BELT: °You are not alone, Seventy-five percent of women have some sort of abdominal or back pain at some point in their pregnancy. Your baby is growing at a fast pace, which means that your whole body is rapidly trying to adjust to the changes. As your uterus grows, your back may start feeling a bit under stress and this can result in back or abdominal pain that can go from mild, and therefore bearable, to severe pains that will not allow you to sit or lay down comfortably, When it comes to dealing with pregnancy-related pains and cramps, some pregnant women usually prefer natural remedies, which the market is filled with nowadays. For example, wearing a pregnancy support belt can help ease and lessen your discomfort and pain. °WHAT ARE THE BENEFITS OF WEARING A PREGNANCY SUPPORT BELT? A pregnancy support belt provides support to the lower portion of the belly taking some of the weight of the growing uterus and distributing to the other parts of your body. It is designed make you comfortable and gives you extra support. Over the years, the pregnancy apparel market has been studying the needs and wants of pregnant women and they have come up with the most comfortable pregnancy support belts that woman could ever ask for. In fact, you will no longer have to wear a stretched-out or bulky pregnancy belt that is visible underneath your clothes and makes you feel even more uncomfortable. Nowadays, a pregnancy support belt is made of comfortable and stretchy materials that will not irritate your skin but will actually make you feel at ease and you will not even notice you are wearing it. They are easy to put on and adjust during the day and can be worn at night for additional support.  °BENEFITS: °• Relives Back pain °• Relieves Abdominal Muscle and Leg Pain °• Stabilizes the Pelvic Ring °• Offers a Cushioned Abdominal Lift Pad °• Relieves pressure on the Sciatic Nerve Within Minutes °WHERE TO GET  YOUR PREGNANCY BELT: [employer] [phone] @[address] [place], [place] [address] ° ° °

## 2019-01-13 NOTE — MAU Note (Signed)
Katrina Stewart is a 23 y.o. at [redacted]w[redacted]d here in MAU reporting: since earlier today she has been having lower back pain that radiates into her abdomen. Also seeing some light pink bleeding when she wipes, that also started today. No LOF. +FM  Onset of complaint: today  Pain score: 8/10  Vitals:   01/13/19 1545  BP: (!) 151/75  Pulse: (!) 108  Resp: 17  Temp: 98.9 F (37.2 C)  SpO2: 100%     FHT: +FM  Lab orders placed from triage: UA

## 2019-01-13 NOTE — Progress Notes (Signed)
E-signature pad not working, discharge signature obtained on paper copy, put with medical records.

## 2019-01-13 NOTE — MAU Provider Note (Signed)
Chief Complaint:  Back Pain and Vaginal Bleeding   First Provider Initiated Contact with Patient 01/13/19 1636      HPI: Katrina Stewart is a 23 y.o. G3P1011 at 73w1dby early ultrasound pt of [place] complicated by GDM, CHTN, and depression who presents to maternity admissions reporting constant back pain with intermittent worsening and pink spotting when wiping x1.  She reports the pain is gradually worsening since onset today.  The pain is low in the middle of her back and does not radiate. It is constant but does wax and wane in intensity.  She has not tried any treatments.  It is associated with an episode of spotting today.  There are no other symptoms.  She reports good fetal movement.   HPI  Past Medical History: Past Medical History:  Diagnosis Date  . Anxiety   . COVID-19 virus detected   . Depression   . Vaginal Pap smear, abnormal     Past obstetric history: OB History  Gravida Para Term Preterm AB Living  _0 SAB TAB Ectopic Multiple Live Births  1 0   0 1    # Outcome Date GA Lbr Len/2nd Weight Sex Delivery Anes PTL Lv  3 Current           2 Term 02/22/16 47w1d4:20 / 00:26 3440 g M Vag-Spont EPI  LIV  1 SAB             Past Surgical History: Past Surgical History:  Procedure Laterality Date  . BREAST SURGERY  2013   Lt & Rt excision juvenile fibroadenoma  . MASS EXCISION  10/29/2011   Procedure: EXCISION MASS;  Surgeon: DoHarl BowieMD;  Location: [facility];  Service: General;  Laterality: Bilateral;  excision of bilateral breast masses    Family History: Family History  Problem Relation Age of Onset  . Cancer Maternal Grandmother        lung cancer  . Cancer Other        bladder cancer  . Asthma Brother     Social History: Social History   Tobacco Use  . Smoking status: Never Smoker  . Smokeless tobacco: Never Used  Substance Use Topics  . Alcohol use: Not Currently    Alcohol/week: 2.0 - 3.0 standard drinks     Types: 2 - 3 Shots of liquor per week    Comment: every weekend before pregnancy (4-5 drinks)  . Drug use: No    Allergies: No Known Allergies  Meds:  No medications prior to admission.    ROS:  Review of Systems  Constitutional: Negative for chills, fatigue and fever.  Eyes: Negative for visual disturbance.  Respiratory: Negative for shortness of breath.   Cardiovascular: Negative for chest pain.  Gastrointestinal: Negative for abdominal pain, nausea and vomiting.  Genitourinary: Positive for vaginal bleeding. Negative for difficulty urinating, dysuria, flank pain, pelvic pain, vaginal discharge and vaginal pain.  Musculoskeletal: Positive for back pain.  Neurological: Negative for dizziness and headaches.  Psychiatric/Behavioral: Negative.      I have reviewed patient's Past Medical Hx, Surgical Hx, Family Hx, Social Hx, medications and allergies.   Physical Exam   Patient Vitals for the past 24 hrs:  BP Temp Temp src Pulse Resp SpO2 Height Weight  01/13/19 1844 135/77 -- -- (!) 107 16 -- -- --  01/13/19 1801 127/76 -- -- 98 -- -- -- --  01/13/19 1731 125/74 -- --  95 -- -- -- --  01/13/19 1716 130/70 -- -- 92 -- -- -- --  01/13/19 1701 133/81 -- -- 93 -- -- -- --  01/13/19 1646 138/79 -- -- 98 -- -- -- --  01/13/19 1640 139/74 -- -- (!) 103 -- -- -- --  01/13/19 1545 (!) 151/75 98.9 F (37.2 C) Oral (!) 108 17 100 % -- --  01/13/19 1541 -- -- -- -- -- -- 5' 8" (1.727 m) 123.1 kg   Constitutional: Well-developed, well-nourished female in no acute distress.  Cardiovascular: normal rate Respiratory: normal effort GI: Abd soft, non-tender, gravid appropriate for gestational age.  MS: Extremities nontender, no edema, normal ROM Neurologic: Alert and oriented x 4.  GU: Neg CVAT.  PELVIC EXAM: Cervix pink, visually closed, without lesion, scant white creamy discharge, no bleeding visualized, vaginal walls and external genitalia normal   Dilation: Closed Effacement  (%): Thick Cervical Position: Posterior Station: Ballotable Exam by:: Fatima Blank, CNM  FHT:  Baseline 145 , moderate variability, accelerations present, no decelerations Contractions:none on toco or to palpation   Labs: Results for orders placed or performed during the hospital encounter of 01/13/19 (from the past 24 hour(s))  Urinalysis, Routine w reflex microscopic     Status: None   Collection Time: 01/13/19  3:58 PM  Result Value Ref Range   Color, Urine YELLOW YELLOW   APPearance CLEAR CLEAR   Specific Gravity, Urine 1.011 1.005 - 1.030   pH 7.0 5.0 - 8.0   Glucose, UA NEGATIVE NEGATIVE mg/dL   Hgb urine dipstick NEGATIVE NEGATIVE   Bilirubin Urine NEGATIVE NEGATIVE   Ketones, ur NEGATIVE NEGATIVE mg/dL   Protein, ur NEGATIVE NEGATIVE mg/dL   Nitrite NEGATIVE NEGATIVE   Leukocytes,Ua NEGATIVE NEGATIVE   --/--/B NEG (11/06 1230)  Imaging:    MAU Course/MDM: Orders Placed This Encounter  Procedures  . Urinalysis, Routine w reflex microscopic  . Discharge patient    Meds ordered this encounter  Medications  . Elastic Bandages & Supports (COMFORT FIT MATERNITY SUPP MED) MISC    Sig: 1 Device by Does not apply route daily.    Dispense:  1 each    Refill:  0    Order Specific Question:   Supervising Provider    Answer:   Donnamae Jude [[id number]]     NST reviewed and reactive Intake BP elevated, all other BP wnl. No s/sx of PEC.   Cervix 0/thick/high, no evidence of labor.   UA wnl Back pain likely musculoskeletal Rest/ice/heat/warm bath/Tylenol/pregnancy support belt Rx for support belt written F/U in office as scheduled Pt discharge with strict return precautions.    Assessment: 1. Abdominal pain during pregnancy in third trimester   2. Back pain affecting pregnancy in third trimester   3. Chronic hypertension complicating or reason for care during pregnancy, second trimester     Plan: Discharge home Labor precautions and fetal kick  counts Follow-up [place] for [facility] Follow up.   Specialty: Obstetrics and Gynecology Why: As scheduled, return to MAU as needed.   Contact information: [address], [place] [id number] [place] [address] [phone]         Allergies as of 01/13/2019   No Known Allergies     Medication List    TAKE these medications   Accu-Chek FastClix Lancets Misc 1 Device by Percutaneous route 4 (four) times daily.   Accu-Chek Guide test strip Generic drug: glucose blood Use  as instructed   Accu-Chek Guide w/Device Kit 1 Device by Does not apply route daily. Use as directed   acetaminophen 500 MG tablet Commonly known as: TYLENOL Take 1,000 mg by mouth every 6 (six) hours as needed for moderate pain.   aspirin EC 81 MG tablet Take 1 tablet (81 mg total) by mouth daily.   Blood Pressure Kit Devi 1 Device by Does not apply route as needed. ICD 10: Z39.90   Comfort Fit Maternity Supp Med Misc 1 Device by Does not apply route daily.   metoCLOPramide 10 MG tablet Commonly known as: REGLAN Take 1 tablet (10 mg total) by mouth every 8 (eight) hours as needed for nausea (or headache (to be taken with tylenol)).   ondansetron 4 MG disintegrating tablet Commonly known as: ZOFRAN-ODT Take 4 mg by mouth every 8 (eight) hours as needed for nausea or vomiting.   prenatal vitamin w/FE, FA 27-1 MG Tabs tablet Take 1 tablet by mouth daily at 12 noon.   sertraline 50 MG tablet Commonly known as: Zoloft Take 1 tablet (50 mg total) by mouth daily.        [place] Certified Nurse-Midwife 01/14/2019 1:49 PM 

## 2019-01-18 ENCOUNTER — Encounter: Payer: Self-pay | Admitting: *Deleted

## 2019-01-18 LAB — SPECIMEN STATUS REPORT

## 2019-01-18 LAB — ANTIBODY SCREEN: Antibody Screen: NEGATIVE

## 2019-01-19 DIAGNOSIS — O339 Maternal care for disproportion, unspecified: Secondary | ICD-10-CM | POA: Diagnosis not present

## 2019-01-22 ENCOUNTER — Encounter: Payer: Self-pay | Admitting: Obstetrics and Gynecology

## 2019-01-22 ENCOUNTER — Telehealth (INDEPENDENT_AMBULATORY_CARE_PROVIDER_SITE_OTHER): Payer: Medicaid Other | Admitting: Obstetrics and Gynecology

## 2019-01-22 VITALS — BP 127/80 | HR 106

## 2019-01-22 DIAGNOSIS — O99213 Obesity complicating pregnancy, third trimester: Secondary | ICD-10-CM

## 2019-01-22 DIAGNOSIS — O0993 Supervision of high risk pregnancy, unspecified, third trimester: Secondary | ICD-10-CM

## 2019-01-22 DIAGNOSIS — O10912 Unspecified pre-existing hypertension complicating pregnancy, second trimester: Secondary | ICD-10-CM

## 2019-01-22 DIAGNOSIS — Z8659 Personal history of other mental and behavioral disorders: Secondary | ICD-10-CM

## 2019-01-22 DIAGNOSIS — U071 COVID-19: Secondary | ICD-10-CM

## 2019-01-22 DIAGNOSIS — Z283 Underimmunization status: Secondary | ICD-10-CM

## 2019-01-22 DIAGNOSIS — O99210 Obesity complicating pregnancy, unspecified trimester: Secondary | ICD-10-CM

## 2019-01-22 DIAGNOSIS — Z2839 Other underimmunization status: Secondary | ICD-10-CM

## 2019-01-22 DIAGNOSIS — O26893 Other specified pregnancy related conditions, third trimester: Secondary | ICD-10-CM

## 2019-01-22 DIAGNOSIS — O99891 Other specified diseases and conditions complicating pregnancy: Secondary | ICD-10-CM

## 2019-01-22 DIAGNOSIS — Z6791 Unspecified blood type, Rh negative: Secondary | ICD-10-CM

## 2019-01-22 DIAGNOSIS — O0990 Supervision of high risk pregnancy, unspecified, unspecified trimester: Secondary | ICD-10-CM

## 2019-01-22 DIAGNOSIS — O10913 Unspecified pre-existing hypertension complicating pregnancy, third trimester: Secondary | ICD-10-CM

## 2019-01-22 DIAGNOSIS — Z3A30 30 weeks gestation of pregnancy: Secondary | ICD-10-CM

## 2019-01-22 DIAGNOSIS — O24410 Gestational diabetes mellitus in pregnancy, diet controlled: Secondary | ICD-10-CM

## 2019-01-22 DIAGNOSIS — O360930 Maternal care for other rhesus isoimmunization, third trimester, not applicable or unspecified: Secondary | ICD-10-CM

## 2019-01-22 MED ORDER — SERTRALINE HCL 50 MG PO TABS: 100.0000 mg | ORAL_TABLET | Freq: Every day | ORAL | 3 refills | Status: DC

## 2019-01-22 NOTE — Progress Notes (Signed)
TELEHEALTH OBSTETRICS PRENATAL VIRTUAL VIDEO VISIT ENCOUNTER NOTE  Provider location: Center for [place] at [place]   I connected with Katrina Stewart on 01/22/19 at  8:35 AM EST by MyChart Video Encounter at home and verified that I am speaking with the correct person using two identifiers.   I discussed the limitations, risks, security and privacy concerns of performing an evaluation and management service virtually and the availability of in person appointments. I also discussed with the patient that there may be a patient responsible charge related to this service. The patient expressed understanding and agreed to proceed. Subjective:  Katrina Stewart is a 23 y.o. G3P1011 at 4w3dbeing seen today for ongoing prenatal care.  She is currently monitored for the following issues for this high-risk pregnancy and has Alcohol use disorder; Severe episode of recurrent major depressive disorder, without psychotic features ([place]; Rh negative state in antepartum period; Rubella non-immune status, antepartum; Supervision of high risk pregnancy, antepartum; History of depression; Maternal obesity affecting pregnancy, antepartum; Chronic hypertension complicating or reason for care during pregnancy, second trimester; COVID-19 virus infection; UTI (urinary tract infection); Shortness of breath; and Gestational diabetes mellitus (GDM), antepartum on their problem list.  Patient reports no complaints.  Contractions: Irritability. Vag. Bleeding: None.  Movement: Present. Denies any leaking of fluid.   The following portions of the patient's history were reviewed and updated as appropriate: allergies, current medications, past family history, past medical history, past social history, past surgical history and problem list.   Objective:   Vitals:   01/22/19 0837  BP: 127/80  Pulse: (!) 106   Fetal Status:     Movement: Present     General:  Alert, oriented and cooperative. Patient is in no  acute distress.  Respiratory: Normal respiratory effort, no problems with respiration noted  Mental Status: Normal mood and affect. Normal behavior. Normal judgment and thought content.  Rest of physical exam deferred due to type of encounter  Imaging:   Assessment and Plan:  Pregnancy: G3P1011 at 335w3d1. Chronic hypertension complicating or reason for care during pregnancy, second trimester Cont baby ASA  2. Maternal obesity affecting pregnancy, antepartum  3. Supervision of high risk pregnancy, antepartum  4. History of depression On zoloft 50 mg daily Feels like it is not really helping Will double dose to 100 mg   5. Diet controlled gestational diabetes mellitus (GDM), antepartum Has appt with Bev tomorrow Advised to bring supplies to visit  6. Rubella non-immune status, antepartum MMR pp  7. Rh negative state in antepartum period S/p Rho gam  8. COVID-19 virus infection   Preterm labor symptoms and general obstetric precautions including but not limited to vaginal bleeding, contractions, leaking of fluid and fetal movement were reviewed in detail with the patient. I discussed the assessment and treatment plan with the patient. The patient was provided an opportunity to ask questions and all were answered. The patient agreed with the plan and demonstrated an understanding of the instructions. The patient was advised to call back or seek an in-person office evaluation/[facility] or concerning symptoms. Please refer to After Visit Summary for other counseling recommendations.   I provided 20 minutes of face-to-face time during this encounter.  Return in about 2 weeks (around 02/05/2019) for high OB, virtual.  Future Appointments  Date Time Provider [place]12/22/2020 10:15 AM W[place]1/06/2019  1:15 PM WH[place] WH-MFCUS MFC-US  02/06/2019  1:20 PM [place] NURSE [place] MFC-US    Sloan Leiter, [place] for [place]

## 2019-01-22 NOTE — Progress Notes (Signed)
Pt states has not started testing her Glucose Levels yet, she has an appt with Bev Tuesday 01/23/19.

## 2019-01-23 ENCOUNTER — Other Ambulatory Visit: Payer: Self-pay | Admitting: Lactation Services

## 2019-01-23 ENCOUNTER — Other Ambulatory Visit: Payer: Self-pay

## 2019-01-23 ENCOUNTER — Encounter: Payer: Medicaid Other | Attending: Obstetrics & Gynecology | Admitting: *Deleted

## 2019-01-23 ENCOUNTER — Encounter: Payer: Medicaid Other | Admitting: Registered"

## 2019-01-23 ENCOUNTER — Ambulatory Visit: Payer: Medicaid Other | Admitting: *Deleted

## 2019-01-23 DIAGNOSIS — Z3A00 Weeks of gestation of pregnancy not specified: Secondary | ICD-10-CM | POA: Diagnosis not present

## 2019-01-23 DIAGNOSIS — O10912 Unspecified pre-existing hypertension complicating pregnancy, second trimester: Secondary | ICD-10-CM | POA: Insufficient documentation

## 2019-01-23 DIAGNOSIS — Z713 Dietary counseling and surveillance: Secondary | ICD-10-CM | POA: Insufficient documentation

## 2019-01-23 MED ORDER — ACCU-CHEK SOFTCLIX LANCETS MISC: 12 refills | Status: DC

## 2019-01-23 NOTE — Progress Notes (Signed)
  Patient was seen on 01/23/2019 for Gestational Diabetes self-management. EDD 03/30/2019. Patient states no history of GDM. Diet history obtained. Patient eats fair variety of all food groups. Beverages include juice regularly, water occasionally.  The following learning objectives were met by the patient :   States the definition of Gestational Diabetes  States why dietary management is important in controlling blood glucose  Describes the effects of carbohydrates on blood glucose levels  Demonstrates ability to create a balanced meal plan  Demonstrates carbohydrate counting   States when to check blood glucose levels  Demonstrates proper blood glucose monitoring techniques  States the effect of stress and exercise on blood glucose levels  States the importance of limiting caffeine and abstaining from alcohol and smoking  Plan:  Aim for 3 Carb Choices per meal (45 grams) +/- 1 either way  Aim for 1-2 Carbs per snack Begin reading food labels for Total Carbohydrate of foods If OK with your MD, consider  increasing your activity level by walking, Arm Chair Exercises or other activity daily as tolerated Begin checking BG before breakfast and 2 hours after first bite of breakfast, lunch and dinner as directed by MD  Bring Log Book/Sheet and meter to every medical appointment OR use Baby Scripts (see below) Baby Scripts:  Patient was introduced to [employer] and plans to use as record of BG electronically  Take medication if directed by MD  Patient already has a meter: Accu Chek Guide Me The kit has Soft Clix lancing device instead of Fast Clix, so it does not use the drums which were prescribed. I reviewed this with patient and will have Rx sent in for Soft Clix lancet.  I also reviewed the use of BIN# for meter to be free and provided her the info to take back to her pharmacist. Patient instructed to test pre breakfast and 2 hours each meal as directed by MD  Patient instructed  to monitor glucose levels: FBS: 60 - 95 mg/dl 2 hour: <120 mg/dl  Patient received the following handouts:  Nutrition Diabetes and Pregnancy  Carbohydrate Counting List  Patient will be seen for follow-up as needed.

## 2019-01-31 ENCOUNTER — Other Ambulatory Visit: Payer: Self-pay

## 2019-02-02 NOTE — L&D Delivery Note (Signed)
OB/GYN Faculty Practice Delivery Note  Jalexus Puhl is a 24 y.o. G3P1011 s/p vaginal delivery at [redacted]w[redacted]d. She was admitted for IOL 2/2 cHTN and A1GDM.   ROM: 11h 66m with clear fluid GBS Status: positive Maximum Maternal Temperature: 99.4*F  Labor Progress: Patient was induced with FB and cytotec. Pitocin was started and she was eventually AROMed. She progressed to complete and delivered shortly after.  Delivery Date/Time: 03/24/19, 0408 Delivery: Called to room and patient was complete and pushing. Head delivered LOP. No nuchal cord present. Shoulder and body delivered in usual fashion. Infant with spontaneous cry, placed on mother's abdomen, dried and stimulated. Cord clamped x 2 after 1-minute delay, and cut by support person under my direct supervision. Cord blood drawn. Placenta delivered spontaneously with gentle cord traction. Fundus firm with massage and Pitocin. Labia, perineum, vagina, and cervix were inspected, and a right periurethral and a first degree were noted- repaired with Vicryl in the usual fashion.   Placenta: 3 vessel cord, intact with white firm nodules, to L&D Complications: None Lacerations: right periurethral and a first degree, repaired with Vicryl in the usual fashion EBL: 235 mL Analgesia: epidural  Postpartum Planning [x]  message to sent to schedule follow-up  [x]  vaccines UTD  Infant: female  APGARs 8,9  2815 g  Merilyn Baba, DO OB/GYN Fellow, Faculty Practice

## 2019-02-05 ENCOUNTER — Ambulatory Visit (INDEPENDENT_AMBULATORY_CARE_PROVIDER_SITE_OTHER): Payer: Medicaid Other | Admitting: Family Medicine

## 2019-02-05 ENCOUNTER — Other Ambulatory Visit: Payer: Self-pay

## 2019-02-05 ENCOUNTER — Ambulatory Visit (INDEPENDENT_AMBULATORY_CARE_PROVIDER_SITE_OTHER): Payer: Medicaid Other | Admitting: *Deleted

## 2019-02-05 VITALS — BP 128/74 | HR 88 | Temp 98.4°F | Wt 273.2 lb

## 2019-02-05 DIAGNOSIS — O10919 Unspecified pre-existing hypertension complicating pregnancy, unspecified trimester: Secondary | ICD-10-CM

## 2019-02-05 DIAGNOSIS — O10913 Unspecified pre-existing hypertension complicating pregnancy, third trimester: Secondary | ICD-10-CM

## 2019-02-05 DIAGNOSIS — Z283 Underimmunization status: Secondary | ICD-10-CM

## 2019-02-05 DIAGNOSIS — O0990 Supervision of high risk pregnancy, unspecified, unspecified trimester: Secondary | ICD-10-CM

## 2019-02-05 DIAGNOSIS — O360930 Maternal care for other rhesus isoimmunization, third trimester, not applicable or unspecified: Secondary | ICD-10-CM

## 2019-02-05 DIAGNOSIS — O0993 Supervision of high risk pregnancy, unspecified, third trimester: Secondary | ICD-10-CM

## 2019-02-05 DIAGNOSIS — Z3A32 32 weeks gestation of pregnancy: Secondary | ICD-10-CM

## 2019-02-05 DIAGNOSIS — O26893 Other specified pregnancy related conditions, third trimester: Secondary | ICD-10-CM

## 2019-02-05 DIAGNOSIS — O24410 Gestational diabetes mellitus in pregnancy, diet controlled: Secondary | ICD-10-CM

## 2019-02-05 DIAGNOSIS — O26899 Other specified pregnancy related conditions, unspecified trimester: Secondary | ICD-10-CM

## 2019-02-05 DIAGNOSIS — O09899 Supervision of other high risk pregnancies, unspecified trimester: Secondary | ICD-10-CM

## 2019-02-05 DIAGNOSIS — Z6791 Unspecified blood type, Rh negative: Secondary | ICD-10-CM

## 2019-02-05 DIAGNOSIS — F332 Major depressive disorder, recurrent severe without psychotic features: Secondary | ICD-10-CM

## 2019-02-05 DIAGNOSIS — O99891 Other specified diseases and conditions complicating pregnancy: Secondary | ICD-10-CM

## 2019-02-05 DIAGNOSIS — O10912 Unspecified pre-existing hypertension complicating pregnancy, second trimester: Secondary | ICD-10-CM

## 2019-02-05 DIAGNOSIS — U071 COVID-19: Secondary | ICD-10-CM

## 2019-02-05 MED ORDER — ONDANSETRON 4 MG PO TBDP: 4.0000 mg | ORAL_TABLET | Freq: Three times a day (TID) | ORAL | 2 refills | Status: DC | PRN

## 2019-02-05 NOTE — Progress Notes (Signed)
   PRENATAL VISIT NOTE  Subjective:  Katrina Stewart is a 24 y.o. G3P1011 at 88w3dbeing seen today for ongoing prenatal care.  She is currently monitored for the following issues for this high-risk pregnancy and has Alcohol use disorder; Severe episode of recurrent major depressive disorder, without psychotic features ([place]; Rh negative state in antepartum period; Rubella non-immune status, antepartum; Supervision of high risk pregnancy, antepartum; History of depression; Maternal obesity affecting pregnancy, antepartum; Chronic hypertension complicating or reason for care during pregnancy, second trimester; COVID-19 virus infection; UTI (urinary tract infection); Shortness of breath; and Gestational diabetes mellitus (GDM), antepartum on their problem list.  Patient reports no complaints.  Contractions: Irritability. Vag. Bleeding: None.  Movement: Present. Denies leaking of fluid.   The following portions of the patient's history were reviewed and updated as appropriate: allergies, current medications, past family history, past medical history, past social history, past surgical history and problem list.   Objective:   Vitals:   02/05/19 1009  BP: 128/74  Pulse: 88  Temp: 98.4 F (36.9 C)  Weight: 273 lb 3.2 oz (123.9 kg)    Fetal Status: Fetal Heart Rate (bpm): 138   Movement: Present     General:  Alert, oriented and cooperative. Patient is in no acute distress.  Skin: Skin is warm and dry. No rash noted.   Cardiovascular: Normal heart rate noted  Respiratory: Normal respiratory effort, no problems with respiration noted  Abdomen: Soft, gravid, appropriate for gestational age.  Pain/Pressure: Absent     Pelvic: Cervical exam deferred        Extremities: Normal range of motion.  Edema: Trace  Mental Status: Normal mood and affect. Normal behavior. Normal judgment and thought content.   Assessment and Plan:  Pregnancy: G3P1011 at 369w3d. Chronic hypertension complicating or  reason for care during pregnancy, second trimester BP normal. On ASA As BP normal not on meds, does not need antenatal testing per MFM.  2. Supervision of high risk pregnancy, antepartum FHT and FH normal  3. Severe episode of recurrent major depressive disorder, without psychotic features (HCTwo StrikeZoloft increased last visit Referral made for integrative BHRamirenoisit  4. Diet controlled gestational diabetes mellitus (GDM), antepartum Well controlled  5. COVID-19 virus infection dx'd 11/2. No lasting complications  6. Rh negative state in antepartum period Rhogam already given  7. Rubella non-immune status, antepartum MMR postdelivery  Preterm labor symptoms and general obstetric precautions including but not limited to vaginal bleeding, contractions, leaking of fluid and fetal movement were reviewed in detail with the patient. Please refer to After Visit Summary for other counseling recommendations.   Return in about 2 weeks (around 02/19/2019) for HR OB f/u, In Office.  Future Appointments  Date Time Provider [place]1/06/2019  1:15 PM WHSummer ShadeSKorea WH-MFCUS MFC-US  02/06/2019  1:20 PM WHPut-in-BayURSE WHBeltonFC-US  02/12/2019  2:15 PM DaSloan LeiterMD WOC-WOCA WOGages [place]1/18/2021 10:15 AM StTruett MainlandDO WOC-WOCA WOC  02/26/2019 10:15 AM StNehemiah SettleJaTanna SavoyDO WOC-WOCA WOPettisvilleDO

## 2019-02-05 NOTE — Progress Notes (Signed)
Pt has [place] for growth on 02/06/19

## 2019-02-05 NOTE — Progress Notes (Signed)
Pt's problem list and medical history were reviewed with Dr. Nehemiah Settle this morning prior to her arrival for appointment. Pt does not meet criteria for fetal testing and therefore NST/BPP was not be completed as scheduled. When pt arrived, she was informed of the reasoning for a change in her appointment for today. She voiced understanding.

## 2019-02-06 ENCOUNTER — Encounter (HOSPITAL_COMMUNITY): Payer: Self-pay | Admitting: *Deleted

## 2019-02-06 ENCOUNTER — Ambulatory Visit (HOSPITAL_COMMUNITY): Payer: Medicaid Other | Admitting: *Deleted

## 2019-02-06 ENCOUNTER — Ambulatory Visit (HOSPITAL_COMMUNITY)
Admission: RE | Admit: 2019-02-06 | Discharge: 2019-02-06 | Disposition: A | Discharge status: Home / Self Care | Payer: Medicaid Other | Source: Ambulatory Visit | Attending: Obstetrics and Gynecology | Admitting: Obstetrics and Gynecology

## 2019-02-06 ENCOUNTER — Other Ambulatory Visit (HOSPITAL_COMMUNITY): Payer: Self-pay | Admitting: *Deleted

## 2019-02-06 DIAGNOSIS — Z362 Encounter for other antenatal screening follow-up: Secondary | ICD-10-CM

## 2019-02-06 DIAGNOSIS — Z3A32 32 weeks gestation of pregnancy: Secondary | ICD-10-CM

## 2019-02-06 DIAGNOSIS — O99313 Alcohol use complicating pregnancy, third trimester: Secondary | ICD-10-CM | POA: Diagnosis not present

## 2019-02-06 DIAGNOSIS — Z8659 Personal history of other mental and behavioral disorders: Secondary | ICD-10-CM

## 2019-02-06 DIAGNOSIS — O0990 Supervision of high risk pregnancy, unspecified, unspecified trimester: Secondary | ICD-10-CM | POA: Insufficient documentation

## 2019-02-06 DIAGNOSIS — O24410 Gestational diabetes mellitus in pregnancy, diet controlled: Secondary | ICD-10-CM | POA: Diagnosis not present

## 2019-02-06 DIAGNOSIS — O10912 Unspecified pre-existing hypertension complicating pregnancy, second trimester: Secondary | ICD-10-CM | POA: Diagnosis not present

## 2019-02-06 DIAGNOSIS — O10013 Pre-existing essential hypertension complicating pregnancy, third trimester: Secondary | ICD-10-CM | POA: Diagnosis not present

## 2019-02-06 DIAGNOSIS — O10919 Unspecified pre-existing hypertension complicating pregnancy, unspecified trimester: Secondary | ICD-10-CM | POA: Diagnosis not present

## 2019-02-06 DIAGNOSIS — O360130 Maternal care for anti-D [Rh] antibodies, third trimester, not applicable or unspecified: Secondary | ICD-10-CM | POA: Diagnosis not present

## 2019-02-06 DIAGNOSIS — O99213 Obesity complicating pregnancy, third trimester: Secondary | ICD-10-CM | POA: Diagnosis not present

## 2019-02-06 DIAGNOSIS — O99210 Obesity complicating pregnancy, unspecified trimester: Secondary | ICD-10-CM

## 2019-02-12 ENCOUNTER — Encounter: Payer: Medicaid Other | Admitting: Obstetrics and Gynecology

## 2019-02-12 ENCOUNTER — Other Ambulatory Visit: Payer: Medicaid Other

## 2019-02-12 NOTE — BH Specialist Note (Signed)
Integrated Behavioral Health via Telemedicine Video Visit  02/12/2019 Katrina Stewart [id number]  Number of Integrated Behavioral Health visits: 1 (4 total) Session Start time: 10:29  Session End time: 11:05 Total time: 34  Referring Provider: Loma Boston, DO Type of Visit: Video Patient/Family location: Home [name] Provider location: [facility] All persons participating in visit: Patient Katrina Stewart and Katrina Stewart    Confirmed patient's address: Yes  Confirmed patient's phone number: Yes  Any changes to demographics: No   Confirmed patient's insurance: Yes  Any changes to patient's insurance: No   Discussed confidentiality: Yes   I connected with Lauriana Knippenberg  by a video enabled telemedicine application and verified that I am speaking with the correct person using two identifiers.     I discussed the limitations of evaluation and management by telemedicine and the availability of in person appointments.  I discussed that the purpose of this visit is to provide behavioral health care while limiting exposure to the novel coronavirus.   Discussed there is a possibility of technology failure and discussed alternative modes of communication if that failure occurs.  I discussed that engaging in this video visit, they consent to the provision of behavioral healthcare and the services will be billed under their insurance.  Patient and/or legal guardian expressed understanding and consented to video visit: Yes   PRESENTING CONCERNS: Patient and/or family reports the following symptoms/concerns: Pt states her primary symptoms are difficulty falling asleep (sleeps 5/6am until 10am), attributed to feeling uncomfortable in pregnancy, along with depression, irritability and feeling bad about herself. Pt is taking Zoloft 100mg  as prescribed.  Duration of problem: Ongoing; Severity of problem: moderate  STRENGTHS (Protective Factors/Coping Skills): Open to treatment  GOALS  ADDRESSED: Patient will: 1.  Reduce symptoms of: anxiety, depression and insomnia  2.  Increase knowledge and/or ability of: healthy habits  3.  Demonstrate ability to: Increase healthy adjustment to current life circumstances and Increase motivation to adhere to plan of care  INTERVENTIONS: Interventions utilized:  Sleep Hygiene and Psychoeducation and/or Health Education Standardized Assessments completed: Took PHQ9/GAD7 less than 2 wks ago  ASSESSMENT: Patient currently experiencing Major depressive disorder, recurrent, moderate.   Patient may benefit from psychoeducation and brief therapeutic interventions regarding coping with symptoms of depression, anxiety and insomnia .  PLAN: 1. Follow up with behavioral health clinician on : Two weeks 2. Behavioral recommendations:  -Continue taking Zoloft and prenatal vitamin daily, as prescribed -Begin taking either Melatonin 3mg  OR Unisom, at bedtime, not both, as recommended by medical provider -When starting to feel tired (5-6pm), allow self a 10-20 minute nap, rather than pushing past the tired -Consider putting phone in charger one hour prior to bedtime, and do not use again until morning -Continue taking warm bath before bed, sleeping in a cool room at night, and refraining from caffeine drinks 3. Referral(s): [place] (In Clinic)  I discussed the assessment and treatment plan with the patient and/or parent/guardian. They were provided an opportunity to ask questions and all were answered. They agreed with the plan and demonstrated an understanding of the instructions.   They were advised to call back or seek an in-person evaluation if the symptoms worsen or if the condition fails to improve as anticipated.  Caroleen Hamman [facility]  Depression screen [facility] 2/9 02/05/2019 01/22/2019 04/07/2016 02/20/2016 02/06/2016  Decreased Interest 1 1 1 1 1   Down, Depressed, Hopeless 3 3 1 1 1   PHQ - 2 Score 4 4 2 2  2  Altered  sleeping 2 0 1 1 1   Tired, decreased energy 1 1 1 1 1   Change in appetite 1 1 1 1 1   Feeling bad or failure about yourself  3 3 1  - 1  Trouble concentrating 0 0 0 0 0  Moving slowly or fidgety/restless 0 0 0 0 0  Suicidal thoughts 0 0 1 0 0  PHQ-9 Score 11 9 7 5 6   Difficult doing work/chores - Not difficult at all - - -  Some recent data might be hidden   GAD 7 : Generalized Anxiety Score 02/05/2019 04/07/2016 02/20/2016 01/12/2016  Nervous, Anxious, on Edge 2 1 1 1   Control/stop worrying 1 1 1 1   Worry too much - different things 1 1 1 1   Trouble relaxing 0 1 0 0  Restless 0 0 0 0  Easily annoyed or irritable 3 3 1 2   Afraid - awful might happen 0 1 1 1   Total GAD 7 Score 7 8 5  6

## 2019-02-16 ENCOUNTER — Other Ambulatory Visit: Payer: Self-pay

## 2019-02-16 ENCOUNTER — Ambulatory Visit (INDEPENDENT_AMBULATORY_CARE_PROVIDER_SITE_OTHER): Payer: Medicaid Other | Admitting: Clinical

## 2019-02-16 DIAGNOSIS — F332 Major depressive disorder, recurrent severe without psychotic features: Secondary | ICD-10-CM

## 2019-02-16 DIAGNOSIS — F331 Major depressive disorder, recurrent, moderate: Secondary | ICD-10-CM

## 2019-02-16 NOTE — Progress Notes (Signed)
She can try the melatonin (3mg  at bedtime) or she could try unisom, which is available over the counter.

## 2019-02-16 NOTE — Patient Instructions (Signed)
BRAINSTORMING  Develop a Plan Goals: . Provide a way to start conversation about your new life with a baby . Assist parents in recognizing and using resources within their reach . Help pave the way before birth for an easier period of transition afterwards.  Make a list of the following information to keep in a central location: . Full name of Mom and Partner: _____________________________________________ . 23 full name and Date of Birth: ___________________________________________ . Home Address: ___________________________________________________________ ________________________________________________________________________ . Home Phone: ____________________________________________________________ . Parents' cell numbers: _____________________________________________________ ________________________________________________________________________ . Name and contact info for OB: ______________________________________________ . Name and contact info for Pediatrician:________________________________________ . Contact info for Lactation Consultants: ________________________________________  REST and SLEEP *You each need at least 4-5 hours of uninterrupted sleep every day. Write specific names and contact information.* . How are you going to rest in the postpartum period? While partner's home? When partner returns to work? When you both return to work? Marland Kitchen Where will your baby sleep? Marland Kitchen Who is available to help during the day? Evening? Night? . Who could move in for a period to help support you? Marland Kitchen What are some ideas to help you get enough sleep? __________________________________________________________________________________________________________________________________________________________________________________________________________________________________________ NUTRITIOUS FOOD AND DRINK *Plan for meals before your baby is born so you can have healthy food to eat  during the immediate postpartum period.* . Who will look after breakfast? Lunch? Dinner? List names and contact information. Brainstorm quick, healthy ideas for each meal. . What can you do before baby is born to prepare meals for the postpartum period? . How can others help you with meals? Marland Kitchen Which grocery stores provide online shopping and delivery? Marland Kitchen Which restaurants offer take-out or delivery options? ______________________________________________________________________________________________________________________________________________________________________________________________________________________________________________________________________________________________________________________________________________________________________________________________________  CARE FOR MOM *It's important that mom is cared for and pampered in the postpartum period. Remember, the most important ways new mothers need care are: sleep, nutrition, gentle exercise, and time off.* . Who can come take care of mom during this period? Make a list of people with their contact information. . List some activities that make you feel cared for, rested, and energized? Who can make sure you have opportunities to do these things? . Does mom have a space of her very own within your home that's just for her? Make a "[facility]" where she can be comfortable, rest, and renew herself daily. ______________________________________________________________________________________________________________________________________________________________________________________________________________________________________________________________________________________________________________________________________________________________________________________________________    CARE FOR AND FEEDING BABY *Knowledgeable and encouraging people will offer the best support with regard to feeding your  baby.* . Educate yourself and choose the best feeding option for your baby. . Make a list of people who will guide, support, and be a resource for you as your care for and feed your baby. (Friends that have breastfed or are currently breastfeeding, lactation consultants, breastfeeding support groups, etc.) . Consider a postpartum doula. (These websites can give you information: dona.org & BuyingShow.es) . Seek out local breastfeeding resources like the breastfeeding support group at [employer] or [employer]. ______________________________________________________________________________________________________________________________________________________________________________________________________________________________________________________________________________________________________________________________________________________________________________________________________  Verner Chol AND ERRANDS . Who can help with a thorough cleaning before baby is born? . Make a list of people who will help with housekeeping and chores, like laundry, light cleaning, dishes, bathrooms, etc. . Who can run some errands for you? Marland Kitchen What can you do to make sure you are stocked with basic supplies before baby is born? . Who is going to do the shopping? ______________________________________________________________________________________________________________________________________________________________________________________________________________________________________________________________________________________________________________________________________________________________________________________________________     Family Adjustment *Nurture yourselves.it helps parents be more loving and allows for better bonding with their child.* .  What sorts of things do you and partner enjoy doing together? Which activities help you to connect and strengthen your relationship? Make a list of  those things. Make a list of people whom you trust to care for your baby so you can have some time together as a couple. . What types of things help partner feel connected to Mom? Make a list. . What needs will partner have in order to bond with baby? . Other children? Who will care for them when you go into labor and while you are in the hospital? . Think about what the needs of your older children might be. Who can help you meet those needs? In what ways are you helping them prepare for bringing baby home? List some specific strategies you have for family adjustment. _______________________________________________________________________________________________________________________________________________________________________________________________________________________________________________________________________________________________________________________________________________  SUPPORT *Someone who can empathize with experiences normalizes your problems and makes them more bearable.* . Make a list of other friends, neighbors, and/or co-workers you know with infants (and small children, if applicable) with whom you can connect. . Make a list of local or online support groups, mom groups, etc. in which you can be involved. ______________________________________________________________________________________________________________________________________________________________________________________________________________________________________________________________________________________________________________________________________________________________________________________________________  Childcare Plans . Investigate and plan for childcare if mom is returning to work. . Talk about mom's concerns about her transition back to work. . Talk about partner's concerns regarding this transition.  Mental Health *Your mental health is one of the highest priorities for  a pregnant or postpartum mom.* . 1 in 5 women experience anxiety and/or depression from the time of conception through the first year after birth. . Postpartum Mood Disorders are the #1 complication of pregnancy and childbirth and the suffering experienced by these mothers is not necessary! These illnesses are temporary and respond well to treatment, which often includes self-care, social support, talk therapy, and medication when needed. . Women experiencing anxiety and depression often say things like: "I'm supposed to be happy.why do I feel so sad?", "Why can't I snap out of it?", "I'm having thoughts that scare me." . There is no need to be embarrassed if you are feeling these symptoms: o Overwhelmed, anxious, angry, sad, guilty, irritable, hopeless, exhausted but can't sleep o You are NOT alone. You are NOT to blame. With help, you WILL be well. . Where can I find help? Medical professionals such as your OB, midwife, gynecologist, family practitioner, primary care provider, pediatrician, or mental health providers; [facility] support groups: Feelings After Birth, Breastfeeding Support Group, Baby and Me Group, and Fit 4 Two exercise classes. . You have permission to ask for help. It will confirm your feelings, validate your experiences, share/learn coping strategies, and gain support and encouragement as you heal. You are important! BRAINSTORM . Make a list of local resources, including resources for mom and for partner. . Identify support groups. . Identify people to call late at night - include names and contact info. . Talk with partner about perinatal mood and anxiety disorders. . Talk with your OB, midwife, and doula about baby blues and about perinatal mood and anxiety disorders. . Talk with your pediatrician about perinatal mood and anxiety disorders.   Support & Sanity Savers   What do you really need?  . Basics . In preparing for a new baby, many expectant parents spend  hours shopping for baby clothes, decorating the nursery, and deciding which car seat to buy. Yet most don't think much about what the reality of parenting a newborn will be like, and what they need to make  it through that. So, here is the advice of experienced parents. We know you'll read this, and think "they're exaggerating, I don't really need that." Just trust [place] on these, OK? Plan for all of this, and if it turns out you don't need it, come back and teach [place] how you did it!  Katrina Stewart (Once baby's survival needs are met, make sure you attend to your own survival needs!) . Sleep . An average newborn sleeps 16-18 hours per day, over 6-7 sleep periods, rarely more than three hours at a time. It is normal and healthy for a newborn to wake throughout the night... but really hard on parents!! . Naps. Prioritize sleep above any responsibilities like: cleaning house, visiting friends, running errands, etc.  Sleep whenever baby sleeps. If you can't nap, at least have restful times when baby eats. The more rest you get, the more patient you will be, the more emotionally stable, and better at solving problems.  . Food . You may not have realized it would be difficult to eat when you have a newborn. Yet, when we talk to . countless new parents, they say things like "it may be 2:00 pm when I realize I haven't had breakfast yet." Or "every time we sit down to dinner, baby needs to eat, and my food gets cold, so I don't bother to eat it." . Finger food. Before your baby is born, stock up with one months' worth of food that: 1) you can eat with one hand while holding a baby, 2) doesn't need to be prepped, 3) is good hot or cold, 4) doesn't spoil when left out for a few hours, and 5) you like to eat. Think about: nuts, dried fruit, Clif bars, pretzels, jerky, gogurt, baby carrots, apples, bananas, crackers, cheez-n-crackers, string cheese, hot pockets or frozen burritos to microwave, garden burgers and breakfast  pastries to put in the toaster, yogurt drinks, etc. . Restaurant Menus. Make lists of your favorite restaurants & menu items. When family/friends want to help, you can give specific information without much thought. They can either bring you the food or send gift cards for just the right meals. Katrina Stewart Meals.  Take some time to make a few meals to put in the freezer ahead of time.  Easy to freeze meals can be anything such as soup, lasagna, chicken pie, or spaghetti sauce. . Set up a Meal Schedule.  Ask friends and family to sign up to bring you meals during the first few weeks of being home. (It can be passed around at baby showers!) You have no idea how helpful this will be until you are in the throes of parenting.  https://hamilton-woodard.com/ is a great website to check out. . Emotional Support . Know who to call when you're stressed out. Parenting a newborn is very challenging work. There are times when it totally overwhelms your normal coping abilities. EVERY NEW PARENT NEEDS TO HAVE A PLAN FOR WHO TO CALL WHEN THEY JUST CAN'T COPE ANY MORE. (And it has to be someone other than the baby's other parent!) Before your baby is born, come up with at least one person you can call for support - write their phone number down and post it on the refrigerator. Marland Kitchen Anxiety & Sadness. Baby blues are normal after pregnancy; however, there are more severe types of anxiety & sadness which can occur and should not be ignored.  They are always treatable, but you have to take the first step by  reaching out for help. [facility] offers a "Mom Talk" group which meets every Tuesday from 10 am - 11 am.  This group is for new moms who need support and connection after their babies are born.  Call [phone].  Marland Kitchen Really, Really Helpful (Plan for them! Make sure these happen often!!) . Physical Support with Taking Care of Yourselves . Asking friends and family. Before your baby is born, set up a schedule of people who can come  and visit and help out (or ask a friend to schedule for you). Any time someone says "let me know what I can do to help," sign them up for a day. When they get there, their job is not to take care of the baby (that's your job and your joy). Their job is to take care of you!  . Postpartum doulas. If you don't have anyone you can call on for support, look into postpartum doulas:  professionals at helping parents with caring for baby, caring for themselves, getting breastfeeding started, and helping with household tasks. www.padanc.org is a helpful website for learning about doulas in our area. . Peer Support / Parent Groups . Why: One of the greatest ideas for new parents is to be around other new parents. Parent groups give you a chance to share and listen to others who are going through the same season of life, get a sense of what is normal infant development by watching several babies learn and grow, share your stories of triumph and struggles with empathetic ears, and forgive your own mistakes when you realize all parents are learning by trial and error. . Where to find: There are many places you can meet other new parents throughout our community.  [facility] offers the following classes for new moms and their little ones:  Baby and Me (Birth to [place]) and Breastfeeding Support Group. Go to www.conehealthybaby.com or call [phone] for more information. . Time for your Relationship . It's easy to get so caught up in meeting baby's immediate needs that it's hard to find time to connect with your partner, and meet the needs of your relationship. It's also easy to forget what "quality time with your partner" actually looks like. If you take your baby on a date, you'd be amazed how much of your couple time is spent feeding the baby, diapering the baby, admiring the baby, and talking about the baby. . Dating: Try to take time for just the two of you. Babysitter tip: Sometimes when moms are  breastfeeding a newborn, they find it hard to figure out how to schedule outings around baby's unpredictable feeding schedules. Have the babysitter come for a three hour period. When she comes over, if baby has just eaten, you can leave right away, and come back in two hours. If baby hasn't fed recently, you start the date at home. Once baby gets hungry and gets a good feeding in, you can head out for the rest of your date time. . Date Nights at Home: If you can't get out, at least set aside one evening a week to prioritize your relationship: whenever baby dozes off or doesn't have any immediate needs, spend a little time focusing on each other. . Potential conflicts: The main relationship conflicts that come up for new parents are: issues related to sexuality, financial stresses, a feeling of an unfair division of household tasks, and conflicts in parenting styles. The more you can work on these issues before baby arrives, the better!  Marland Kitchen  Fun and Frills (Don't forget these. and don't feel guilty for indulging in them!) . Everyone has something in life that is a fun little treat that they do just for themselves. It may be: reading the morning paper, or going for a daily jog, or having coffee with a friend once a week, or going to a movie on Friday nights, or fine chocolates, or bubble baths, or curling up with a good book. . Unless you do fun things for yourself every now and then, it's hard to have the energy for fun with your baby. Whatever your "special" treats are, make sure you find a way to continue to indulge in them after your baby is born. These special moments can recharge you, and allow you to return to baby with a new joy   PERINATAL MOOD DISORDERS: MATERNAL MENTAL HEALTH FROM CONCEPTION THROUGH THE POSTPARTUM PERIOD   Emergency and Crisis Resources:  If you are an imminent risk to self or others, are experiencing intense personal distress, and/or have noticed significant changes in activities  of daily living, call:  . 911 . [facility]: [phone] . Mobile Crisis: 877-626-1772 . National Suicide Hotline: 1-800-273-8255 Or visit the following crisis centers: . Local Emergency Departments . [facility]: [address], [place] [phone]. Hours: 8:30AM-5PM. Insurance Accepted: Medicaid, Medicare, and Uninsured.  . [facility]  [address], [place] Mon-Friday 8am-3pm  [phone]                                                                                    Non-Crisis Resources: To identify specific providers that are covered by your insurance, contact your insurance company or local agencies: [place]: 1-800-256-2452 [facility]: 888-581-9988 [employer]: 1-800-939-5911 Postpartum Support International- [place] 1-800-944-4773                                                      Outpatient therapy and medication management providers:  [facility] [phone] Hours: 9AM-5PM  Insurance Accepted: AARP, Aetna, BCBS, Cigna, [employer], Humana, Medicare  [facility] [phone] Hours: 8AM-5PM  nsurance Accepted: All insurances EXCEPT AARP, Aetna, [employer], and Humana [facility]: [phone]             Hours: 8AM-8PM Insurance Accepted: Aetna, BCBS, Cigna, [employer], Medicaid, Medicare, Uninsured [facility]: [phone] [facility]: [phone] Hours: 8:30AM-7PM Insurance Accepted: Aetna, BCBS, Medicaid, Medicare, Tricare, United Healthcare [facility]:  [phone]              Hours:9AM-5PM Insurance Accepted:  Aetna, BCBS, [facility], Medicaid, United Health Care  [facility] [phone] Hours: 9AM-5:30PM Insurance Accepted: AARP, Aetna, BCBS, Cigna, and Medicaid, Medicare, United Health Care [facility]:   [phone] Hours: 9am-5pm Insurance Accepted: BCBS; they do not accept Medicaid/Medicare [facility]: [phone] Hours: 9am-9pm Insurance Accepted: All major insurance including Medicaid and Medicare [facility]: [phone] Hours: 9AM-5:30PM Insurance   Accepted: All insurances EXCEPT Medicaid and Medicare. [facility]: [phone]                                                                       Parenting Support Groups [facility]: [phone] [facility]:  [phone] Family Support Network (support for children in the NICU and/or with special needs), [phone]                                                                   Mental Health Support Groups Mental Health Association: [phone]                                                                                     Online Resources: Postpartum Support International: http://www.postpartum.net/  800-944-4PPD 2Moms Supporting Moms:  www.momssupportingmoms.net     

## 2019-02-19 ENCOUNTER — Other Ambulatory Visit: Payer: Self-pay

## 2019-02-19 ENCOUNTER — Ambulatory Visit (INDEPENDENT_AMBULATORY_CARE_PROVIDER_SITE_OTHER): Payer: Medicaid Other | Admitting: Family Medicine

## 2019-02-19 ENCOUNTER — Other Ambulatory Visit: Payer: Medicaid Other

## 2019-02-19 VITALS — BP 123/75 | HR 93 | Wt 279.1 lb

## 2019-02-19 DIAGNOSIS — Z3A34 34 weeks gestation of pregnancy: Secondary | ICD-10-CM

## 2019-02-19 DIAGNOSIS — O24410 Gestational diabetes mellitus in pregnancy, diet controlled: Secondary | ICD-10-CM

## 2019-02-19 DIAGNOSIS — O99891 Other specified diseases and conditions complicating pregnancy: Secondary | ICD-10-CM

## 2019-02-19 DIAGNOSIS — Z2839 Other underimmunization status: Secondary | ICD-10-CM

## 2019-02-19 DIAGNOSIS — O26899 Other specified pregnancy related conditions, unspecified trimester: Secondary | ICD-10-CM

## 2019-02-19 DIAGNOSIS — O10912 Unspecified pre-existing hypertension complicating pregnancy, second trimester: Secondary | ICD-10-CM

## 2019-02-19 DIAGNOSIS — O24419 Gestational diabetes mellitus in pregnancy, unspecified control: Secondary | ICD-10-CM

## 2019-02-19 DIAGNOSIS — Z6791 Unspecified blood type, Rh negative: Secondary | ICD-10-CM

## 2019-02-19 DIAGNOSIS — U071 COVID-19: Secondary | ICD-10-CM

## 2019-02-19 DIAGNOSIS — O26893 Other specified pregnancy related conditions, third trimester: Secondary | ICD-10-CM

## 2019-02-19 DIAGNOSIS — O0990 Supervision of high risk pregnancy, unspecified, unspecified trimester: Secondary | ICD-10-CM

## 2019-02-19 DIAGNOSIS — Z3490 Encounter for supervision of normal pregnancy, unspecified, unspecified trimester: Secondary | ICD-10-CM

## 2019-02-19 DIAGNOSIS — Z283 Underimmunization status: Secondary | ICD-10-CM

## 2019-02-19 DIAGNOSIS — O0993 Supervision of high risk pregnancy, unspecified, third trimester: Secondary | ICD-10-CM

## 2019-02-19 DIAGNOSIS — O10913 Unspecified pre-existing hypertension complicating pregnancy, third trimester: Secondary | ICD-10-CM

## 2019-02-19 MED ORDER — ACCU-CHEK SOFTCLIX LANCETS MISC: 12 refills | Status: DC

## 2019-02-19 MED ORDER — ACCU-CHEK GUIDE VI STRP: ORAL_STRIP | 12 refills | Status: DC

## 2019-02-19 NOTE — Progress Notes (Signed)
Order correct amount of GDM Supplies checking 4x daily.

## 2019-02-19 NOTE — Progress Notes (Signed)
Subjective:  Katrina Stewart is a 24 y.o. G3P1011 at 92w3dbeing seen today for ongoing prenatal care.  She is currently monitored for the following issues for this high-risk pregnancy and has Alcohol use disorder; Severe episode of recurrent major depressive disorder, without psychotic features ([place]; Rh negative state in antepartum period; Rubella non-immune status, antepartum; Supervision of high risk pregnancy, antepartum; History of depression; Maternal obesity affecting pregnancy, antepartum; Chronic hypertension complicating or reason for care during pregnancy, second trimester; COVID-19 virus infection; UTI (urinary tract infection); Shortness of breath; and Gestational diabetes mellitus (GDM), antepartum on their problem list.  GDM: Patient diet controlled.  Fasting: 1 elevated 2hr PP: 6 elevated (120-130) over past 2 weeks.  Patient reports no complaints.  Contractions: Irritability. Vag. Bleeding: None.  Movement: Present. Denies leaking of fluid.   The following portions of the patient's history were reviewed and updated as appropriate: allergies, current medications, past family history, past medical history, past social history, past surgical history and problem list. Problem list updated.  Objective:   Vitals:   02/19/19 1014  BP: 123/75  Pulse: 93  Weight: 279 lb 1.6 oz (126.6 kg)    Fetal Status: Fetal Heart Rate (bpm): 132   Movement: Present     General:  Alert, oriented and cooperative. Patient is in no acute distress.  Skin: Skin is warm and dry. No rash noted.   Cardiovascular: Normal heart rate noted  Respiratory: Normal respiratory effort, no problems with respiration noted  Abdomen: Soft, gravid, appropriate for gestational age. Pain/Pressure: Present     Pelvic: Vag. Bleeding: None     Cervical exam deferred        Extremities: Normal range of motion.  Edema: Trace  Mental Status: Normal mood and affect. Normal behavior. Normal judgment and thought content.    Urinalysis:      Assessment and Plan:  Pregnancy: G3P1011 at 34w3d1. Supervision of high risk pregnancy, antepartum FHT  2. Diet controlled gestational diabetes mellitus (GDM), antepartum Continue diet control for now.   3. Chronic hypertension complicating or reason for care during pregnancy, second trimester BP in range.  ASA 8172maily - glucose blood (ACCU-CHEK GUIDE) test strip; For use 4 times a day  Dispense: 100 each; Refill: 12  4. Rh negative state in antepartum period Rhogam already given  5. Rubella non-immune status, antepartum Will need MMR post delivery  6. COVID-19 virus infection 11/2  Preterm labor symptoms and general obstetric precautions including but not limited to vaginal bleeding, contractions, leaking of fluid and fetal movement were reviewed in detail with the patient. Please refer to After Visit Summary for other counseling recommendations.  No follow-ups on file.   StiTruett MainlandO

## 2019-02-23 ENCOUNTER — Inpatient Hospital Stay (HOSPITAL_BASED_OUTPATIENT_CLINIC_OR_DEPARTMENT_OTHER): Payer: Medicaid Other

## 2019-02-23 ENCOUNTER — Encounter: Payer: Self-pay | Admitting: Obstetrics and Gynecology

## 2019-02-23 ENCOUNTER — Inpatient Hospital Stay (HOSPITAL_COMMUNITY)
Admission: AD | Admit: 2019-02-23 | Discharge: 2019-02-24 | Disposition: A | Discharge status: Home / Self Care | Payer: Medicaid Other | Attending: Obstetrics & Gynecology | Admitting: Obstetrics & Gynecology

## 2019-02-23 ENCOUNTER — Encounter (HOSPITAL_COMMUNITY): Payer: Self-pay | Admitting: Obstetrics & Gynecology

## 2019-02-23 ENCOUNTER — Other Ambulatory Visit: Payer: Self-pay

## 2019-02-23 DIAGNOSIS — O99343 Other mental disorders complicating pregnancy, third trimester: Secondary | ICD-10-CM | POA: Insufficient documentation

## 2019-02-23 DIAGNOSIS — Z7982 Long term (current) use of aspirin: Secondary | ICD-10-CM | POA: Insufficient documentation

## 2019-02-23 DIAGNOSIS — O368130 Decreased fetal movements, third trimester, not applicable or unspecified: Secondary | ICD-10-CM

## 2019-02-23 DIAGNOSIS — E669 Obesity, unspecified: Secondary | ICD-10-CM | POA: Diagnosis not present

## 2019-02-23 DIAGNOSIS — O163 Unspecified maternal hypertension, third trimester: Secondary | ICD-10-CM | POA: Insufficient documentation

## 2019-02-23 DIAGNOSIS — F419 Anxiety disorder, unspecified: Secondary | ICD-10-CM | POA: Insufficient documentation

## 2019-02-23 DIAGNOSIS — O99213 Obesity complicating pregnancy, third trimester: Secondary | ICD-10-CM | POA: Diagnosis not present

## 2019-02-23 DIAGNOSIS — R519 Headache, unspecified: Secondary | ICD-10-CM | POA: Insufficient documentation

## 2019-02-23 DIAGNOSIS — R42 Dizziness and giddiness: Secondary | ICD-10-CM

## 2019-02-23 DIAGNOSIS — O99013 Anemia complicating pregnancy, third trimester: Secondary | ICD-10-CM

## 2019-02-23 DIAGNOSIS — Z3689 Encounter for other specified antenatal screening: Secondary | ICD-10-CM

## 2019-02-23 DIAGNOSIS — Z79899 Other long term (current) drug therapy: Secondary | ICD-10-CM | POA: Insufficient documentation

## 2019-02-23 DIAGNOSIS — O10912 Unspecified pre-existing hypertension complicating pregnancy, second trimester: Secondary | ICD-10-CM

## 2019-02-23 DIAGNOSIS — O99210 Obesity complicating pregnancy, unspecified trimester: Secondary | ICD-10-CM

## 2019-02-23 DIAGNOSIS — O368190 Decreased fetal movements, unspecified trimester, not applicable or unspecified: Secondary | ICD-10-CM

## 2019-02-23 DIAGNOSIS — Z8616 Personal history of COVID-19: Secondary | ICD-10-CM | POA: Insufficient documentation

## 2019-02-23 DIAGNOSIS — Z3A35 35 weeks gestation of pregnancy: Secondary | ICD-10-CM

## 2019-02-23 DIAGNOSIS — O26893 Other specified pregnancy related conditions, third trimester: Secondary | ICD-10-CM | POA: Diagnosis not present

## 2019-02-23 LAB — CBC
HCT: 32 % — ABNORMAL LOW (ref 36.0–46.0)
Hemoglobin: 10.1 g/dL — ABNORMAL LOW (ref 12.0–15.0)
MCH: 22.5 pg — ABNORMAL LOW (ref 26.0–34.0)
MCHC: 31.6 g/dL (ref 30.0–36.0)
MCV: 71.3 fL — ABNORMAL LOW (ref 80.0–100.0)
Platelets: 258 10*3/uL (ref 150–400)
RBC: 4.49 MIL/uL (ref 3.87–5.11)
RDW: 15.9 % — ABNORMAL HIGH (ref 11.5–15.5)
WBC: 7.4 10*3/uL (ref 4.0–10.5)
nRBC: 0 % (ref 0.0–0.2)

## 2019-02-23 LAB — URINALYSIS, ROUTINE W REFLEX MICROSCOPIC
Bilirubin Urine: NEGATIVE
Glucose, UA: NEGATIVE mg/dL
Hgb urine dipstick: NEGATIVE
Ketones, ur: 5 mg/dL — AB
Nitrite: NEGATIVE
Protein, ur: NEGATIVE mg/dL
Specific Gravity, Urine: 1.024 (ref 1.005–1.030)
pH: 6 (ref 5.0–8.0)

## 2019-02-23 LAB — COMPREHENSIVE METABOLIC PANEL
ALT: 10 U/L (ref 0–44)
AST: 12 U/L — ABNORMAL LOW (ref 15–41)
Albumin: 3 g/dL — ABNORMAL LOW (ref 3.5–5.0)
Alkaline Phosphatase: 82 U/L (ref 38–126)
Anion gap: 8 (ref 5–15)
BUN: <5 mg/dL — ABNORMAL LOW (ref 6–20)
CO2: 19 mmol/L — ABNORMAL LOW (ref 22–32)
Calcium: 8.9 mg/dL (ref 8.9–10.3)
Chloride: 108 mmol/L (ref 98–111)
Creatinine, Ser: 0.54 mg/dL (ref 0.44–1.00)
GFR calc Af Amer: >60 mL/min (ref >60)
GFR calc non Af Amer: >60 mL/min (ref >60)
Glucose, Bld: 97 mg/dL (ref 70–99)
Potassium: 3.5 mmol/L (ref 3.5–5.1)
Sodium: 135 mmol/L (ref 135–145)
Total Bilirubin: 0.2 mg/dL — ABNORMAL LOW (ref 0.3–1.2)
Total Protein: 6.4 g/dL — ABNORMAL LOW (ref 6.5–8.1)

## 2019-02-23 LAB — PROTEIN / CREATININE RATIO, URINE
Creatinine, Urine: 182.87 mg/dL
Protein Creatinine Ratio: 0.04 mg/mg{Cre} (ref 0.00–0.15)
Total Protein, Urine: 8 mg/dL

## 2019-02-23 LAB — GLUCOSE, CAPILLARY: Glucose-Capillary: 99 mg/dL (ref 70–99)

## 2019-02-23 MED ORDER — METOCLOPRAMIDE HCL 5 MG/ML IJ SOLN
10.0000 mg | Freq: Once | INTRAMUSCULAR | Status: AC
  Administered 2019-02-23: 23:00:00 10 mg via INTRAVENOUS
  Filled 2019-02-23: qty 2

## 2019-02-23 MED ORDER — FERROUS SULFATE 325 (65 FE) MG PO TABS: 325.0000 mg | ORAL_TABLET | Freq: Every day | ORAL | 3 refills | Status: DC

## 2019-02-23 MED ORDER — DEXAMETHASONE SODIUM PHOSPHATE 10 MG/ML IJ SOLN
10.0000 mg | Freq: Once | INTRAMUSCULAR | Status: AC
  Administered 2019-02-23: 23:00:00 10 mg via INTRAVENOUS
  Filled 2019-02-23: qty 1

## 2019-02-23 MED ORDER — LACTATED RINGERS IV BOLUS
1000.0000 mL | Freq: Once | INTRAVENOUS | Status: AC
  Administered 2019-02-23: 22:00:00 1000 mL via INTRAVENOUS

## 2019-02-23 MED ORDER — DIPHENHYDRAMINE HCL 50 MG/ML IJ SOLN
12.5000 mg | Freq: Once | INTRAMUSCULAR | Status: AC
  Administered 2019-02-23: 12.5 mg via INTRAVENOUS
  Filled 2019-02-23: qty 1

## 2019-02-23 NOTE — Discharge Instructions (Signed)
Preterm Labor and Birth Information ° °The normal length of a pregnancy is 39-41 weeks. Preterm labor is when labor starts before 37 completed weeks of pregnancy. °What are the risk factors for preterm labor? °Preterm labor is more likely to occur in women who: °· Have certain infections during pregnancy such as a bladder infection, sexually transmitted infection, or infection inside the uterus (chorioamnionitis). °· Have a shorter-than-normal cervix. °· Have gone into preterm labor before. °· Have had surgery on their cervix. °· Are younger than age 17 or older than age 35. °· Are African American. °· Are pregnant with twins or multiple babies (multiple gestation). °· Take street drugs or smoke while pregnant. °· Do not gain enough weight while pregnant. °· Became pregnant shortly after having been pregnant. °What are the symptoms of preterm labor? °Symptoms of preterm labor include: °· Cramps similar to those that can happen during a menstrual period. The cramps may happen with diarrhea. °· Pain in the abdomen or lower back. °· Regular uterine contractions that may feel like tightening of the abdomen. °· A feeling of increased pressure in the pelvis. °· Increased watery or bloody mucus discharge from the vagina. °· Water breaking (ruptured amniotic sac). °Why is it important to recognize signs of preterm labor? °It is important to recognize signs of preterm labor because babies who are born prematurely may not be fully developed. This can put them at an increased risk for: °· Long-term (chronic) heart and lung problems. °· Difficulty immediately after birth with regulating body systems, including blood sugar, body temperature, heart rate, and breathing rate. °· Bleeding in the brain. °· Cerebral palsy. °· Learning difficulties. °· Death. °These risks are highest for babies who are born before 34 weeks of pregnancy. °How is preterm labor treated? °Treatment depends on the length of your pregnancy, your condition,  and the health of your baby. It may involve: °· Having a stitch (suture) placed in your cervix to prevent your cervix from opening too early (cerclage). °· Taking or being given medicines, such as: °? Hormone medicines. These may be given early in pregnancy to help support the pregnancy. °? Medicine to stop contractions. °? Medicines to help mature the baby’s lungs. These may be prescribed if the risk of delivery is high. °? Medicines to prevent your baby from developing cerebral palsy. °If the labor happens before 34 weeks of pregnancy, you may need to stay in the hospital. °What should I do if I think I am in preterm labor? °If you think that you are going into preterm labor, call your health care provider right away. °How can I prevent preterm labor in future pregnancies? °To increase your chance of having a full-term pregnancy: °· Do not use any tobacco products, such as cigarettes, chewing tobacco, and e-cigarettes. If you need help quitting, ask your health care provider. °· Do not use street drugs or medicines that have not been prescribed to you during your pregnancy. °· Talk with your health care provider before taking any herbal supplements, even if you have been taking them regularly. °· Make sure you gain a healthy amount of weight during your pregnancy. °· Watch for infection. If you think that you might have an infection, get it checked right away. °· Make sure to tell your health care provider if you have gone into preterm labor before. °This information is not intended to replace advice given to you by your health care provider. Make sure you discuss any questions you have with your   health care provider. Document Revised: 05/12/2018 Document Reviewed: 06/11/2015 Elsevier Patient Education  2020 [place]. Pregnancy and Anemia  Anemia is a condition in which the concentration of red blood cells, or hemoglobin, in the blood is below normal. Hemoglobin is a substance in red blood cells that  carries oxygen to the tissues of the body. Anemia results when enough oxygen does not reach these tissues. Anemia is common during pregnancy because the woman's body needs more blood volume and blood cells to provide nutrition to the fetus. The fetus needs iron and folic acid as it is developing. Your body may not produce enough red blood cells because of this. Also, during pregnancy, the liquid part of the blood (plasma) increases by about 30-50%, and the red blood cells increase by only 20%. This lowers the concentration of the red blood cells and creates a natural anemia-like situation. What are the causes? The most common cause of anemia during pregnancy is not having enough iron in the body to make red blood cells (iron deficiency anemia). Other causes may include:  Folic acid deficiency.  Vitamin B12 deficiency.  Certain prescription or over-the-counter medicines.  Certain medical conditions or infections that destroy red blood cells.  A low platelet count and bleeding caused by antibodies that go through the placenta to the fetus from the mother's blood. What are the signs or symptoms? Mild anemia may not be noticeable. If it becomes severe, symptoms may include:  Feeling tired (fatigue).  Shortness of breath, especially during activity.  Weakness.  Fainting.  Pale looking skin.  Headaches.  A fast or irregular heartbeat (palpitations).  Dizziness. How is this diagnosed? This condition may be diagnosed based on:  Your medical history and a physical exam.  Blood tests. How is this treated? Treatment for anemia during pregnancy depends on the cause of the anemia. Treatment can include:  Dietary changes.  Supplements of iron, vitamin 123456, or folic acid.  A blood transfusion. This may be needed if anemia is severe.  Hospitalization. This may be needed if there is a lot of blood loss or severe anemia. Follow these instructions at home:  Follow recommendations from  your dietitian or health care provider about changing your diet.  Increase your vitamin C intake. This will help the stomach absorb more iron. Some foods that are high in vitamin C include: ? Oranges. ? Peppers. ? Tomatoes. ? Mangoes.  Eat a diet rich in iron. This would include foods such as: ? Liver. ? Beef. ? Eggs. ? Whole grains. ? Spinach. ? Dried fruit.  Take iron and vitamins as told by your health care provider.  Eat green leafy vegetables. These are a good source of folic acid.  Keep all follow-up visits as told by your health care provider. This is important. Contact a health care provider if:  You have frequent or lasting headaches.  You look pale.  You bruise easily. Get help right away if:  You have extreme weakness, shortness of breath, or chest pain.  You become dizzy or have trouble concentrating.  You have heavy vaginal bleeding.  You develop a rash.  You have bloody or black, tarry stools.  You faint.  You vomit up blood.  You vomit repeatedly.  You have abdominal pain.  You have a fever.  You are dehydrated. Summary  Anemia is a condition in which the concentration of red blood cells or hemoglobin in the blood is below normal.  Anemia is common during pregnancy because the woman's  body needs more blood volume and blood cells to provide nutrition to the fetus.  The most common cause of anemia during pregnancy is not having enough iron in the body to make red blood cells (iron deficiency anemia).  Mild anemia may not be noticeable. If it becomes severe, symptoms may include feeling tired and weak. This information is not intended to replace advice given to you by your health care provider. Make sure you discuss any questions you have with your health care provider. Document Revised: 07/04/2018 Document Reviewed: 02/24/2016 Elsevier Patient Education  2020 [place]. Fetal Movement Counts Patient Name:  ________________________________________________ Patient Due Date: ____________________ What is a fetal movement count?  A fetal movement count is the number of times that you feel your baby move during a certain amount of time. This may also be called a fetal kick count. A fetal movement count is recommended for every pregnant woman. You may be asked to start counting fetal movements as early as week 28 of your pregnancy. Pay attention to when your baby is most active. You may notice your baby's sleep and wake cycles. You may also notice things that make your baby move more. You should do a fetal movement count:  When your baby is normally most active.  At the same time each day. A good time to count movements is while you are resting, after having something to eat and drink. How do I count fetal movements? 1. Find a quiet, comfortable area. Sit, or lie down on your side. 2. Write down the date, the start time and stop time, and the number of movements that you felt between those two times. Take this information with you to your health care visits. 3. Write down your start time when you feel the first movement. 4. Count kicks, flutters, swishes, rolls, and jabs. You should feel at least 10 movements. 5. You may stop counting after you have felt 10 movements, or if you have been counting for 2 hours. Write down the stop time. 6. If you do not feel 10 movements in 2 hours, contact your health care provider for further instructions. Your health care provider may want to do additional tests to assess your baby's well-being. Contact a health care provider if:  You feel fewer than 10 movements in 2 hours.  Your baby is not moving like he or she usually does. Date: ____________ Start time: ____________ Stop time: ____________ Movements: ____________ Date: ____________ Start time: ____________ Stop time: ____________ Movements: ____________ Date: ____________ Start time: ____________ Stop time:  ____________ Movements: ____________ Date: ____________ Start time: ____________ Stop time: ____________ Movements: ____________ Date: ____________ Start time: ____________ Stop time: ____________ Movements: ____________ Date: ____________ Start time: ____________ Stop time: ____________ Movements: ____________ Date: ____________ Start time: ____________ Stop time: ____________ Movements: ____________ Date: ____________ Start time: ____________ Stop time: ____________ Movements: ____________ Date: ____________ Start time: ____________ Stop time: ____________ Movements: ____________ This information is not intended to replace advice given to you by your health care provider. Make sure you discuss any questions you have with your health care provider. Document Revised: 09/07/2018 Document Reviewed: 09/07/2018 Elsevier Patient Education  [place]. Preeclampsia and Eclampsia Preeclampsia is a serious condition that may develop during pregnancy. This condition causes high blood pressure and increased protein in your urine along with other symptoms, such as headaches and vision changes. These symptoms may develop as the condition gets worse. Preeclampsia may occur at 20 weeks of pregnancy or later. Diagnosing and treating preeclampsia  early is very important. If not treated early, it can cause serious problems for you and your baby. One problem it can lead to is eclampsia. Eclampsia is a condition that causes muscle jerking or shaking (convulsions or seizures) and other serious problems for the mother. During pregnancy, delivering your baby may be the best treatment for preeclampsia or eclampsia. For most women, preeclampsia and eclampsia symptoms go away after giving birth. In rare cases, a woman may develop preeclampsia after giving birth (postpartum preeclampsia). This usually occurs within 48 hours after childbirth but may occur up to 6 weeks after giving birth. What are the causes? The cause  of preeclampsia is not known. What increases the risk? The following risk factors make you more likely to develop preeclampsia:  Being pregnant for the first time.  Having had preeclampsia during a past pregnancy.  Having a family history of preeclampsia.  Having high blood pressure.  Being pregnant with more than one baby.  Being 47 or older.  Being African-American.  Having kidney disease or diabetes.  Having medical conditions such as lupus or blood diseases.  Being very overweight (obese). What are the signs or symptoms? The most common symptoms are:  Severe headaches.  Vision problems, such as blurred or double vision.  Abdominal pain, especially upper abdominal pain. Other symptoms that may develop as the condition gets worse include:  Sudden weight gain.  Sudden swelling of the hands, face, legs, and feet.  Severe nausea and vomiting.  Numbness in the face, arms, legs, and feet.  Dizziness.  Urinating less than usual.  Slurred speech.  Convulsions or seizures. How is this diagnosed? There are no screening tests for preeclampsia. Your health care provider will ask you about symptoms and check for signs of preeclampsia during your prenatal visits. You may also have tests that include:  Checking your blood pressure.  Urine tests to check for protein. Your health care provider will check for this at every prenatal visit.  Blood tests.  Monitoring your baby's heart rate.  Ultrasound. How is this treated? You and your health care provider will determine the treatment approach that is best for you. Treatment may include:  Having more frequent prenatal exams to check for signs of preeclampsia, if you have an increased risk for preeclampsia.  Medicine to lower your blood pressure.  Staying in the hospital, if your condition is severe. There, treatment will focus on controlling your blood pressure and the amount of fluids in your body (fluid  retention).  Taking medicine (magnesium sulfate) to prevent seizures. This may be given as an injection or through an IV.  Taking a low-dose aspirin during your pregnancy.  Delivering your baby early. You may have your labor started with medicine (induced), or you may have a cesarean delivery. Follow these instructions at home: Eating and drinking   Drink enough fluid to keep your urine pale yellow.  Avoid caffeine. Lifestyle  Do not use any products that contain nicotine or tobacco, such as cigarettes and e-cigarettes. If you need help quitting, ask your health care provider.  Do not use alcohol or drugs.  Avoid stress as much as possible. Rest and get plenty of sleep. General instructions  Take over-the-counter and prescription medicines only as told by your health care provider.  When lying down, lie on your left side. This keeps pressure off your major blood vessels.  When sitting or lying down, raise (elevate) your feet. Try putting some pillows underneath your lower legs.  Exercise regularly.  Ask your health care provider what kinds of exercise are best for you.  Keep all follow-up and prenatal visits as told by your health care provider. This is important. How is this prevented? There is no known way of preventing preeclampsia or eclampsia from developing. However, to lower your risk of complications and detect problems early:  Get regular prenatal care. Your health care provider may be able to diagnose and treat the condition early.  Maintain a healthy weight. Ask your health care provider for help managing weight gain during pregnancy.  Work with your health care provider to manage any long-term (chronic) health conditions you have, such as diabetes or kidney problems.  You may have tests of your blood pressure and kidney function after giving birth.  Your health care provider may have you take low-dose aspirin during your next pregnancy. Contact a health care  provider if:  You have symptoms that your health care provider told you may require more treatment or monitoring, such as: ? Headaches. ? Nausea or vomiting. ? Abdominal pain. ? Dizziness. ? Light-headedness. Get help right away if:  You have severe: ? Abdominal pain. ? Headaches that do not get better. ? Dizziness. ? Vision problems. ? Confusion. ? Nausea or vomiting.  You have any of the following: ? A seizure. ? Sudden, rapid weight gain. ? Sudden swelling in your hands, ankles, or face. ? Trouble moving any part of your body. ? Numbness in any part of your body. ? Trouble speaking. ? Abnormal bleeding.  You faint. Summary  Preeclampsia is a serious condition that may develop during pregnancy.  This condition causes high blood pressure and increased protein in your urine along with other symptoms, such as headaches and vision changes.  Diagnosing and treating preeclampsia early is very important. If not treated early, it can cause serious problems for you and your baby.  Get help right away if you have symptoms that your health care provider told you to watch for. This information is not intended to replace advice given to you by your health care provider. Make sure you discuss any questions you have with your health care provider. Document Revised: 09/20/2017 Document Reviewed: 08/25/2015 Elsevier Patient Education  [place] of Pregnancy The third trimester is from week 28 through week 40 (months 7 through 9). The third trimester is a time when the unborn baby (fetus) is growing rapidly. At the end of the ninth month, the fetus is about 20 inches in length and weighs 6-10 pounds. Body changes during your third trimester Your body will continue to go through many changes during pregnancy. The changes vary from woman to woman. During the third trimester:  Your weight will continue to increase. You can expect to gain 25-35 pounds (11-16 kg) by  the end of the pregnancy.  You may begin to get stretch marks on your hips, abdomen, and breasts.  You may urinate more often because the fetus is moving lower into your pelvis and pressing on your bladder.  You may develop or continue to have heartburn. This is caused by increased hormones that slow down muscles in the digestive tract.  You may develop or continue to have constipation because increased hormones slow digestion and cause the muscles that push waste through your intestines to relax.  You may develop hemorrhoids. These are swollen veins (varicose veins) in the rectum that can itch or be painful.  You may develop swollen, bulging veins (varicose veins) in your legs.  You may have increased body aches in the pelvis, back, or thighs. This is due to weight gain and increased hormones that are relaxing your joints.  You may have changes in your hair. These can include thickening of your hair, rapid growth, and changes in texture. Some women also have hair loss during or after pregnancy, or hair that feels dry or thin. Your hair will most likely return to normal after your baby is born.  Your breasts will continue to grow and they will continue to become tender. A yellow fluid (colostrum) may leak from your breasts. This is the first milk you are producing for your baby.  Your belly button may stick out.  You may notice more swelling in your hands, face, or ankles.  You may have increased tingling or numbness in your hands, arms, and legs. The skin on your belly may also feel numb.  You may feel short of breath because of your expanding uterus.  You may have more problems sleeping. This can be caused by the size of your belly, increased need to urinate, and an increase in your body's metabolism.  You may notice the fetus "dropping," or moving lower in your abdomen (lightening).  You may have increased vaginal discharge.  You may notice your joints feel loose and you may have  pain around your pelvic bone. What to expect at prenatal visits You will have prenatal exams every 2 weeks until week 36. Then you will have weekly prenatal exams. During a routine prenatal visit:  You will be weighed to make sure you and the baby are growing normally.  Your blood pressure will be taken.  Your abdomen will be measured to track your baby's growth.  The fetal heartbeat will be listened to.  Any test results from the previous visit will be discussed.  You may have a cervical check near your due date to see if your cervix has softened or thinned (effaced).  You will be tested for Group B streptococcus. This happens between 35 and 37 weeks. Your health care provider may ask you:  What your birth plan is.  How you are feeling.  If you are feeling the baby move.  If you have had any abnormal symptoms, such as leaking fluid, bleeding, severe headaches, or abdominal cramping.  If you are using any tobacco products, including cigarettes, chewing tobacco, and electronic cigarettes.  If you have any questions. Other tests or screenings that may be performed during your third trimester include:  Blood tests that check for low iron levels (anemia).  Fetal testing to check the health, activity level, and growth of the fetus. Testing is done if you have certain medical conditions or if there are problems during the pregnancy.  Nonstress test (NST). This test checks the health of your baby to make sure there are no signs of problems, such as the baby not getting enough oxygen. During this test, a belt is placed around your belly. The baby is made to move, and its heart rate is monitored during movement. What is false labor? False labor is a condition in which you feel small, irregular tightenings of the muscles in the womb (contractions) that usually go away with rest, changing position, or drinking water. These are called Braxton Hicks contractions. Contractions may last for  hours, days, or even weeks before true labor sets in. If contractions come at regular intervals, become more frequent, increase in intensity, or become painful, you should see your health care provider. What  are the signs of labor?  Abdominal cramps.  Regular contractions that start at 10 minutes apart and become stronger and more frequent with time.  Contractions that start on the top of the uterus and spread down to the lower abdomen and back.  Increased pelvic pressure and dull back pain.  A watery or bloody mucus discharge that comes from the vagina.  Leaking of amniotic fluid. This is also known as your "water breaking." It could be a slow trickle or a gush. Let your health care provider know if it has a color or strange odor. If you have any of these signs, call your health care provider right away, even if it is before your due date. Follow these instructions at home: Medicines  Follow your health care provider's instructions regarding medicine use. Specific medicines may be either safe or unsafe to take during pregnancy.  Take a prenatal vitamin that contains at least 600 micrograms (mcg) of folic acid.  If you develop constipation, try taking a stool softener if your health care provider approves. Eating and drinking   Eat a balanced diet that includes fresh fruits and vegetables, whole grains, good sources of protein such as meat, eggs, or tofu, and low-fat dairy. Your health care provider will help you determine the amount of weight gain that is right for you.  Avoid raw meat and uncooked cheese. These carry germs that can cause birth defects in the baby.  If you have low calcium intake from food, talk to your health care provider about whether you should take a daily calcium supplement.  Eat four or five small meals rather than three large meals a day.  Limit foods that are high in fat and processed sugars, such as fried and sweet foods.  To prevent  constipation: ? Drink enough fluid to keep your urine clear or pale yellow. ? Eat foods that are high in fiber, such as fresh fruits and vegetables, whole grains, and beans. Activity  Exercise only as directed by your health care provider. Most women can continue their usual exercise routine during pregnancy. Try to exercise for 30 minutes at least 5 days a week. Stop exercising if you experience uterine contractions.  Avoid heavy lifting.  Do not exercise in extreme heat or humidity, or at high altitudes.  Wear low-heel, comfortable shoes.  Practice good posture.  You may continue to have sex unless your health care provider tells you otherwise. Relieving pain and discomfort  Take frequent breaks and rest with your legs elevated if you have leg cramps or low back pain.  Take warm sitz baths to soothe any pain or discomfort caused by hemorrhoids. Use hemorrhoid cream if your health care provider approves.  Wear a good support bra to prevent discomfort from breast tenderness.  If you develop varicose veins: ? Wear support pantyhose or compression stockings as told by your healthcare provider. ? Elevate your feet for 15 minutes, 3-4 times a day. Prenatal care  Write down your questions. Take them to your prenatal visits.  Keep all your prenatal visits as told by your health care provider. This is important. Safety  Wear your seat belt at all times when driving.  Make a list of emergency phone numbers, including numbers for family, friends, the hospital, and police and fire departments. General instructions  Avoid cat litter boxes and soil used by cats. These carry germs that can cause birth defects in the baby. If you have a cat, ask someone to clean the litter  box for you.  Do not travel far distances unless it is absolutely necessary and only with the approval of your health care provider.  Do not use hot tubs, steam rooms, or saunas.  Do not drink alcohol.  Do not use  any products that contain nicotine or tobacco, such as cigarettes and e-cigarettes. If you need help quitting, ask your health care provider.  Do not use any medicinal herbs or unprescribed drugs. These chemicals affect the formation and growth of the baby.  Do not douche or use tampons or scented sanitary pads.  Do not cross your legs for long periods of time.  To prepare for the arrival of your baby: ? Take prenatal classes to understand, practice, and ask questions about labor and delivery. ? Make a trial run to the hospital. ? Visit the hospital and tour the maternity area. ? Arrange for maternity or paternity leave through employers. ? Arrange for family and friends to take care of pets while you are in the hospital. ? Purchase a rear-facing car seat and make sure you know how to install it in your car. ? Pack your hospital bag. ? Prepare the baby's nursery. Make sure to remove all pillows and stuffed animals from the baby's crib to prevent suffocation.  Visit your dentist if you have not gone during your pregnancy. Use a soft toothbrush to brush your teeth and be gentle when you floss. Contact a health care provider if:  You are unsure if you are in labor or if your water has broken.  You become dizzy.  You have mild pelvic cramps, pelvic pressure, or nagging pain in your abdominal area.  You have lower back pain.  You have persistent nausea, vomiting, or diarrhea.  You have an unusual or bad smelling vaginal discharge.  You have pain when you urinate. Get help right away if:  Your water breaks before 37 weeks.  You have regular contractions less than 5 minutes apart before 37 weeks.  You have a fever.  You are leaking fluid from your vagina.  You have spotting or bleeding from your vagina.  You have severe abdominal pain or cramping.  You have rapid weight loss or weight gain.  You have shortness of breath with chest pain.  You notice sudden or extreme  swelling of your face, hands, ankles, feet, or legs.  Your baby makes fewer than 10 movements in 2 hours.  You have severe headaches that do not go away when you take medicine.  You have vision changes. Summary  The third trimester is from week 28 through week 40, months 7 through 9. The third trimester is a time when the unborn baby (fetus) is growing rapidly.  During the third trimester, your discomfort may increase as you and your baby continue to gain weight. You may have abdominal, leg, and back pain, sleeping problems, and an increased need to urinate.  During the third trimester your breasts will keep growing and they will continue to become tender. A yellow fluid (colostrum) may leak from your breasts. This is the first milk you are producing for your baby.  False labor is a condition in which you feel small, irregular tightenings of the muscles in the womb (contractions) that eventually go away. These are called Braxton Hicks contractions. Contractions may last for hours, days, or even weeks before true labor sets in.  Signs of labor can include: abdominal cramps; regular contractions that start at 10 minutes apart and become stronger and more  frequent with time; watery or bloody mucus discharge that comes from the vagina; increased pelvic pressure and dull back pain; and leaking of amniotic fluid. This information is not intended to replace advice given to you by your health care provider. Make sure you discuss any questions you have with your health care provider. Document Revised: 05/11/2018 Document Reviewed: 02/24/2016 Elsevier Patient Education  [place].

## 2019-02-23 NOTE — MAU Provider Note (Signed)
History     CSN: [id number]  Arrival date and time: 02/23/19 2003   First Provider Initiated Contact with Patient 02/23/19 2124      Chief Complaint  Patient presents with  . Headache   [name] is a 24 y.o. G3P1011 at 25w0dwho presents to MAU for preeclampsia evaluation after pt reports a HA since Wednesday.  Pt reports she has had a HA since Wednesday and reports she has tried taking multiple doses of extra strength Tylenol, without relief. Pt denies hx of migraine HA, tension HA, but does report frequent headaches this pregnancy that started in November 2020. Pt reports she was seen in MAU for a HA around that time and was given a HA cocktail, which worked for her. Pt reports HA currently 8/10. Pt identifies HA as frontal in location.  Pt also reports dizziness that started today, only while walking. Pt reports it happened when she was "walking too long" and she felt very dizzy and "like she was going to pass out." Pt reports she has eaten some seafood with corn and potatoes for lunch, dry cereal for breakfast, and nothing for dinner. Pt has had 5-6 bottles of water today. Pt reports dizziness occurred around 5pm. Pt denies feeling dizzy like this before.  Pt denies blurry vision/seeing spots, N/V, epigastric pain, swelling in face and hands, sudden weight gain. Pt denies chest pain and SOB.  Pt denies constipation, diarrhea, or urinary problems. Pt denies fever, chills, fatigue, sweating or changes in appetite. Pt denies dizziness, light-headedness, weakness.  Pt denies VB, ctx, LOF and reports less fetal movement today than normal. Pt denies other instances of DFM prior to today.  Current pregnancy problems? GDM, cHTN, RH negative, obesity Blood Type? B negative Allergies? NKDA Current medications? Zoloft, baby ASA, PNVs Current PNC & next appt? [facility], 03/05/2019   OB History    Gravida  3   Para  1   Term  1   Preterm      AB  1   Living  1     SAB   1   TAB  0   Ectopic      Multiple  0   Live Births  1           Past Medical History:  Diagnosis Date  . Anxiety   . COVID-19 virus detected   . Depression   . Vaginal Pap smear, abnormal     Past Surgical History:  Procedure Laterality Date  . BREAST SURGERY  2013   Lt & Rt excision juvenile fibroadenoma  . MASS EXCISION  10/29/2011   Procedure: EXCISION MASS;  Surgeon: DHarl Bowie MD;  Location: [facility];  Service: General;  Laterality: Bilateral;  excision of bilateral breast masses    Family History  Problem Relation Age of Onset  . Cancer Maternal Grandmother        lung cancer  . Cancer Other        bladder cancer  . Asthma Brother     Social History   Tobacco Use  . Smoking status: Never Smoker  . Smokeless tobacco: Never Used  Substance Use Topics  . Alcohol use: Not Currently    Alcohol/week: 2.0 - 3.0 standard drinks    Types: 2 - 3 Shots of liquor per week    Comment: every weekend before pregnancy (4-5 drinks)  . Drug use: No    Allergies: No Known Allergies  Medications Prior to Admission  Medication  Sig Dispense Refill Last Dose  . Accu-Chek Softclix Lancets lancets 4 times daily 100 each 12 02/23/2019 at Unknown time  . acetaminophen (TYLENOL) 500 MG tablet Take 1,000 mg by mouth every 6 (six) hours as needed for moderate pain.   02/23/2019 at Unknown time  . aspirin EC 81 MG tablet Take 1 tablet (81 mg total) by mouth daily. 90 tablet 1 02/23/2019 at Unknown time  . Blood Glucose Monitoring Suppl (ACCU-CHEK GUIDE) w/Device KIT 1 Device by Does not apply route daily. Use as directed 1 kit 0 02/23/2019 at Unknown time  . Blood Pressure Monitoring (BLOOD PRESSURE KIT) DEVI 1 Device by Does not apply route as needed. ICD 10: Z39.90 1 Device 0 02/23/2019 at Unknown time  . Elastic Bandages & Supports (COMFORT FIT MATERNITY SUPP MED) MISC 1 Device by Does not apply route daily. 1 each 0 02/23/2019 at Unknown time  . glucose blood (ACCU-CHEK  GUIDE) test strip For use 4 times a day 100 each 12 02/23/2019 at Unknown time  . ondansetron (ZOFRAN-ODT) 4 MG disintegrating tablet Take 1 tablet (4 mg total) by mouth every 8 (eight) hours as needed for nausea or vomiting. 60 tablet 2 02/23/2019 at Unknown time  . prenatal vitamin w/FE, FA (PRENATAL 1 + 1) 27-1 MG TABS tablet Take 1 tablet by mouth daily at 12 noon. 30 tablet 11 02/23/2019 at Unknown time  . sertraline (ZOLOFT) 50 MG tablet Take 2 tablets (100 mg total) by mouth daily. 60 tablet 3 02/23/2019 at Unknown time    Review of Systems  Constitutional: Negative for chills, diaphoresis, fatigue and fever.  Eyes: Negative for visual disturbance.  Respiratory: Negative for shortness of breath.   Cardiovascular: Negative for chest pain.  Gastrointestinal: Negative for abdominal pain, constipation, diarrhea, nausea and vomiting.  Genitourinary: Negative for dysuria, flank pain, frequency, pelvic pain, urgency, vaginal bleeding and vaginal discharge.  Neurological: Positive for dizziness and headaches. Negative for weakness and light-headedness.   Physical Exam   Blood pressure 130/72, pulse 98, temperature 98.9 F (37.2 C), temperature source Oral, resp. rate 18, height 5' 8"  (1.727 m), weight 126.6 kg, last menstrual period 06/23/2018, SpO2 100 %.  Patient Vitals for the past 24 hrs:  BP Temp Temp src Pulse Resp SpO2 Height Weight  02/23/19 2345 130/72 -- -- 98 -- 100 % -- --  02/23/19 2313 126/69 -- -- 90 -- -- -- --  02/23/19 2230 124/80 -- -- (!) 101 -- 99 % -- --  02/23/19 2215 117/68 -- -- (!) 105 -- -- -- --  02/23/19 2200 109/66 -- -- (!) 104 -- 100 % -- --  02/23/19 2146 110/79 -- -- (!) 104 -- -- -- --  02/23/19 2131 123/79 -- -- (!) 110 -- -- -- --  02/23/19 2116 132/84 -- -- (!) 110 -- -- -- --  02/23/19 2101 138/74 -- -- (!) 111 -- -- -- --  02/23/19 2046 (!) 149/83 -- -- (!) 107 -- -- -- --  02/23/19 2031 (!) 143/80 -- -- (!) 116 -- -- -- --  02/23/19 2027 (!) 142/87  -- -- (!) 123 -- -- -- --  02/23/19 2013 (!) 143/74 98.9 F (37.2 C) Oral (!) 114 18 -- 5' 8"  (1.727 m) 126.6 kg   Physical Exam  Constitutional: She is oriented to person, place, and time. She appears well-developed and well-nourished. No distress.  HENT:  Head: Normocephalic and atraumatic.  Respiratory: Effort normal.  GI: Soft. She exhibits no distension and  no mass. There is no abdominal tenderness. There is no rebound and no guarding.  Neurological: She is alert and oriented to person, place, and time.  Skin: Skin is warm and dry. She is not diaphoretic.  Psychiatric: She has a normal mood and affect. Her behavior is normal. Judgment and thought content normal.   Results for orders placed or performed during the hospital encounter of 02/23/19 (from the past 24 hour(s))  Urinalysis, Routine w reflex microscopic     Status: Abnormal   Collection Time: 02/23/19  8:37 PM  Result Value Ref Range   Color, Urine YELLOW YELLOW   APPearance HAZY (A) CLEAR   Specific Gravity, Urine 1.024 1.005 - 1.030   pH 6.0 5.0 - 8.0   Glucose, UA NEGATIVE NEGATIVE mg/dL   Hgb urine dipstick NEGATIVE NEGATIVE   Bilirubin Urine NEGATIVE NEGATIVE   Ketones, ur 5 (A) NEGATIVE mg/dL   Protein, ur NEGATIVE NEGATIVE mg/dL   Nitrite NEGATIVE NEGATIVE   Leukocytes,Ua TRACE (A) NEGATIVE   RBC / HPF 0-5 0 - 5 RBC/hpf   WBC, UA 0-5 0 - 5 WBC/hpf   Bacteria, UA RARE (A) NONE SEEN   Squamous Epithelial / LPF 6-10 0 - 5   Mucus PRESENT   Protein / creatinine ratio, urine     Status: None   Collection Time: 02/23/19  9:21 PM  Result Value Ref Range   Creatinine, Urine 182.87 mg/dL   Total Protein, Urine 8 mg/dL   Protein Creatinine Ratio 0.04 0.00 - 0.15 mg/mg[Cre]  CBC     Status: Abnormal   Collection Time: 02/23/19  9:50 PM  Result Value Ref Range   WBC 7.4 4.0 - 10.5 K/uL   RBC 4.49 3.87 - 5.11 MIL/uL   Hemoglobin 10.1 (L) 12.0 - 15.0 g/dL   HCT 32.0 (L) 36.0 - 46.0 %   MCV 71.3 (L) 80.0 - 100.0 fL    MCH 22.5 (L) 26.0 - 34.0 pg   MCHC 31.6 30.0 - 36.0 g/dL   RDW 15.9 (H) 11.5 - 15.5 %   Platelets 258 150 - 400 K/uL   nRBC 0.0 0.0 - 0.2 %  Comprehensive metabolic panel     Status: Abnormal   Collection Time: 02/23/19  9:50 PM  Result Value Ref Range   Sodium 135 135 - 145 mmol/L   Potassium 3.5 3.5 - 5.1 mmol/L   Chloride 108 98 - 111 mmol/L   CO2 19 (L) 22 - 32 mmol/L   Glucose, Bld 97 70 - 99 mg/dL   BUN <5 (L) 6 - 20 mg/dL   Creatinine, Ser 0.54 0.44 - 1.00 mg/dL   Calcium 8.9 8.9 - 10.3 mg/dL   Total Protein 6.4 (L) 6.5 - 8.1 g/dL   Albumin 3.0 (L) 3.5 - 5.0 g/dL   AST 12 (L) 15 - 41 U/L   ALT 10 0 - 44 U/L   Alkaline Phosphatase 82 38 - 126 U/L   Total Bilirubin 0.2 (L) 0.3 - 1.2 mg/dL   GFR calc non Af Amer >60 >60 mL/min   GFR calc Af Amer >60 >60 mL/min   Anion gap 8 5 - 15  Glucose, capillary     Status: None   Collection Time: 02/23/19  9:57 PM  Result Value Ref Range   Glucose-Capillary 99 70 - 99 mg/dL   MAU Course  Procedures  MDM -preeclampsia evaluation in setting of cHTN without meds, elevated systolic BP on admission to MAU with normalization of BP  during stay -CBG: 99 -per Dr. Elonda Husky, [place] to give decadron to patient in setting of GDM and CBG 99 -HA 8/10, HA cocktail given, pt reports HA now 0/10 -UA: hazy/5ketones/trace leuks/rare bacteria, sending urine for culture -CBC: H/H 10.1/32, platelets 258 -CMP: serum creatinine 0.54, AST/ALT 12/10 -PCr: 0.04  -DFM beginning today -EFM: reactive       -baseline: 140       -variability: moderate       -accels: present, 15x15       -decels: ?variable       -TOCO: no ctx -iced water, crackers and PB given, pt now reports fetal movement has returned to normal -BPP: 8/8  -dizziness -EKG: sinus tachycardia, possible left atrial enlargement, consulted with cardiology '@1137PM'$ , confirms normal EKG -no episode of dizziness while in MAU -pulse lowered to normal range with fluids  -pt discharged to home in  stable condition  Orders Placed This Encounter  Procedures  . Culture, OB Urine    Standing Status:   Standing    Number of Occurrences:   1  . [place] MFM FETAL BPP WO NON STRESS    Standing Status:   Standing    Number of Occurrences:   1    Order Specific Question:   Symptom/Reason for Exam    Answer:   Decreased fetal movement [025427]  . Urinalysis, Routine w reflex microscopic    Standing Status:   Standing    Number of Occurrences:   1  . CBC    Standing Status:   Standing    Number of Occurrences:   1  . Comprehensive metabolic panel    Standing Status:   Standing    Number of Occurrences:   1  . Protein / creatinine ratio, urine    Standing Status:   Standing    Number of Occurrences:   1  . Glucose, capillary    Standing Status:   Standing    Number of Occurrences:   1  . EKG 12-Lead    Standing Status:   Standing    Number of Occurrences:   1    Order Specific Question:   Reason for Exam    Answer:   dizziness  . Insert peripheral IV    Standing Status:   Standing    Number of Occurrences:   1  . Discharge patient    Order Specific Question:   Discharge disposition    Answer:   01-Home or Self Care [1]    Order Specific Question:   Discharge patient date    Answer:   02/23/2019   Meds ordered this encounter  Medications  . lactated ringers bolus 1,000 mL  . metoCLOPramide (REGLAN) injection 10 mg  . diphenhydrAMINE (BENADRYL) injection 12.5 mg  . dexamethasone (DECADRON) injection 10 mg  . ferrous sulfate 325 (65 FE) MG tablet    Sig: Take 1 tablet (325 mg total) by mouth daily.    Dispense:  30 tablet    Refill:  3    Order Specific Question:   Supervising Provider    Answer:   Tania Ade H [[id number]]   Assessment and Plan   1. Pregnancy headache in third trimester   2. Chronic hypertension complicating or reason for care during pregnancy, second trimester   3. Maternal obesity affecting pregnancy, antepartum   4. Decreased fetal movement   5. Dizziness    6. Anemia affecting pregnancy in third trimester   7. [redacted] weeks gestation of pregnancy  8. NST (non-stress test) reactive    Allergies as of 02/23/2019   No Known Allergies     Medication List    TAKE these medications   Accu-Chek Guide test strip Generic drug: glucose blood For use 4 times a day   Accu-Chek Guide w/Device Kit 1 Device by Does not apply route daily. Use as directed   Accu-Chek Softclix Lancets lancets 4 times daily   acetaminophen 500 MG tablet Commonly known as: TYLENOL Take 1,000 mg by mouth every 6 (six) hours as needed for moderate pain.   aspirin EC 81 MG tablet Take 1 tablet (81 mg total) by mouth daily.   Blood Pressure Kit Devi 1 Device by Does not apply route as needed. ICD 10: Z39.90   [place] 1 Device by Does not apply route daily.   ferrous sulfate 325 (65 FE) MG tablet Take 1 tablet (325 mg total) by mouth daily.   ondansetron 4 MG disintegrating tablet Commonly known as: ZOFRAN-ODT Take 1 tablet (4 mg total) by mouth every 8 (eight) hours as needed for nausea or vomiting.   prenatal vitamin w/FE, FA 27-1 MG Tabs tablet Take 1 tablet by mouth daily at 12 noon.   sertraline 50 MG tablet Commonly known as: Zoloft Take 2 tablets (100 mg total) by mouth daily.      -will call with culture results, if positive -RX iron -discussed use of compression stockings/socks and link between long periods of standing and dizziness in pregnancy -discussed appropriate hydration and nutrition in pregnancy -discussed that mothers do not perceive 100% of fetal movement, and there is wide variation of mother perception of fetal movement -discussed that fetal movement varies somewhat depending on the time of day and gestational age and there is a wide variety of normal movement among healthy babies -discussed that frequency of movement typically increases from morning to night, with peak activity late at night -discussed  that fetal sleep cycles become longer with advancing gestation and can last about 20-37mnutes -pt advised to contact HCP immediately if noticing a decrease in fetal movement compared to what is normal -discussed FKCs vs. monitoring movements; can either do fetal kick counting, or just be aware of what is normal for baby in terms of movement; no one method is better than the other -FKC: at least 10 fetal movements (FMs) over up to two hours when at rest and focused on counting -pt discharged to home in stable condition  NElmyra RicksE Yulianna Folse 02/23/2019, 11:59 PM

## 2019-02-23 NOTE — MAU Note (Signed)
PT SAYS HAS H/A SINCE WED. TOOK XS TYLENOL  2 TAB AT 5 PM-  NO RELIEF .  TODAY STARTED DIZZY WHEN WALKING . FEELS SLIGHT DIZZY NOW WHEN WALKING FROM LOBBY . [facility] WITH  CLINIC

## 2019-02-23 NOTE — Progress Notes (Signed)
Reviewed data from [employer], patient has not been logging CBGs 4x per day. Readings within normal range. Per provider note, she was seen recently in office and CBGs reviewed with provider.    Feliz Beam, M.D. Attending Center for [employer] farm manager)

## 2019-02-25 LAB — CULTURE, OB URINE

## 2019-02-26 ENCOUNTER — Other Ambulatory Visit: Payer: Medicaid Other

## 2019-02-26 ENCOUNTER — Encounter: Payer: Medicaid Other | Admitting: Family Medicine

## 2019-02-27 NOTE — BH Specialist Note (Signed)
Integrated Behavioral Health via Telemedicine Video Visit  02/27/2019 Miana Lensch PM:[id number]  Number of Integrated Behavioral Health visits: 2 (5 total) Session Start time: 10:47  Session End time: 11:05 Total time: 18  Referring Provider: Loma Boston, DO Type of Visit: Video Patient/Family location: Home [facility] Provider location: [facility] All persons participating in visit: Patient [name] and [place]    Confirmed patient's address: Yes  Confirmed patient's phone number: Yes  Any changes to demographics: No   Confirmed patient's insurance: Yes  Any changes to patient's insurance: No   Discussed confidentiality: At previous visit  I connected with [name] by a video enabled telemedicine application and verified that I am speaking with the correct person using two identifiers.     I discussed the limitations of evaluation and management by telemedicine and the availability of in person appointments.  I discussed that the purpose of this visit is to provide behavioral health care while limiting exposure to the novel coronavirus.   Discussed there is a possibility of technology failure and discussed alternative modes of communication if that failure occurs.  I discussed that engaging in this video visit, they consent to the provision of behavioral healthcare and the services will be billed under their insurance.  Patient and/or legal guardian expressed understanding and consented to video visit: Yes   PRESENTING CONCERNS: Patient and/or family reports the following symptoms/concerns: Pt states she is taking  melatonin, and is helping somewhat with sleep, but physical aches in late pregnancy is making her more uncomfortable to sleep. No other concerns at this time.  Duration of problem: ongoing; Severity of problem: moderate  STRENGTHS (Protective Factors/Coping Skills): Supportive family; Adhering to treatment  GOALS ADDRESSED: Patient will: 1.   Reduce symptoms of: anxiety, depression and insomnia  2.  Increase knowledge and/or ability of: healthy habits  3.  Demonstrate ability to: Increase healthy adjustment to current life circumstances  INTERVENTIONS: Interventions utilized:  Psychoeducation and/or Health Education and Link to [employer] Standardized Assessments completed: Not Needed  ASSESSMENT: Patient currently experiencing Major depressive disorder, recurrent, moderate.   Patient may benefit from continued psychoeducation and brief therapeutic interventions regarding coping with symptoms of depression and anxiety .  PLAN: 1. Follow up with behavioral health clinician on : As needed 2. Behavioral recommendations:  -Continue taking Zoloft as prescribed; continue to take melatonin as recommended by medical provider -Read through Postpartum Planner sent via After Visit Summary today -Consider looking through [email or web address] to find other relevant information  3. Referral(s): [place] (In Clinic)  I discussed the assessment and treatment plan with the patient and/or parent/guardian. They were provided an opportunity to ask questions and all were answered. They agreed with the plan and demonstrated an understanding of the instructions.   They were advised to call back or seek an in-person evaluation if the symptoms worsen or if the condition fails to improve as anticipated.  Caroleen Hamman [facility]  Depression screen [facility] 2/9 02/05/2019 01/22/2019 04/07/2016 02/20/2016 02/06/2016  Decreased Interest 1 1 1 1 1   Down, Depressed, Hopeless 3 3 1 1 1   PHQ - 2 Score 4 4 2 2 2   Altered sleeping 2 0 1 1 1   Tired, decreased energy 1 1 1 1 1   Change in appetite 1 1 1 1 1   Feeling bad or failure about yourself  3 3 1  - 1  Trouble concentrating 0 0 0 0 0  Moving slowly or fidgety/restless 0 0 0 0 0  Suicidal thoughts 0 0 1 0 0  PHQ-9 Score 11 9 7 5 6   Difficult doing work/chores - Not difficult at all  - - -  Some recent data might be hidden   GAD 7 : Generalized Anxiety Score 02/05/2019 04/07/2016 02/20/2016 01/12/2016  Nervous, Anxious, on Edge 2 1 1 1   Control/stop worrying 1 1 1 1   Worry too much - different things 1 1 1 1   Trouble relaxing 0 1 0 0  Restless 0 0 0 0  Easily annoyed or irritable 3 3 1 2   Afraid - awful might happen 0 1 1 1   Total GAD 7 Score 7 8 5  6

## 2019-03-02 ENCOUNTER — Ambulatory Visit (INDEPENDENT_AMBULATORY_CARE_PROVIDER_SITE_OTHER): Payer: Medicaid Other | Admitting: Clinical

## 2019-03-02 ENCOUNTER — Encounter: Payer: Self-pay | Admitting: Family Medicine

## 2019-03-02 ENCOUNTER — Other Ambulatory Visit: Payer: Self-pay

## 2019-03-02 DIAGNOSIS — F331 Major depressive disorder, recurrent, moderate: Secondary | ICD-10-CM

## 2019-03-02 NOTE — Patient Instructions (Signed)
[email or web address]      BRAINSTORMING  Develop a Plan Goals: . Provide a way to start conversation about your new life with a baby . Assist parents in recognizing and using resources within their reach . Help pave the way before birth for an easier period of transition afterwards.  Make a list of the following information to keep in a central location: . Full name of Mom and Partner: _____________________________________________ . 61 full name and Date of Birth: ___________________________________________ . Home Address: ___________________________________________________________ ________________________________________________________________________ . Home Phone: ____________________________________________________________ . Parents' cell numbers: _____________________________________________________ ________________________________________________________________________ . Name and contact info for OB: ______________________________________________ . Name and contact info for Pediatrician:________________________________________ . Contact info for Lactation Consultants: ________________________________________  REST and SLEEP *You each need at least 4-5 hours of uninterrupted sleep every day. Write specific names and contact information.* . How are you going to rest in the postpartum period? While partner's home? When partner returns to work? When you both return to work? Marland Kitchen Where will your baby sleep? Marland Kitchen Who is available to help during the day? Evening? Night? . Who could move in for a period to help support you? Marland Kitchen What are some ideas to help you get enough sleep? __________________________________________________________________________________________________________________________________________________________________________________________________________________________________________ NUTRITIOUS FOOD AND DRINK *Plan for meals before your baby is born so you can  have healthy food to eat during the immediate postpartum period.* . Who will look after breakfast? Lunch? Dinner? List names and contact information. Brainstorm quick, healthy ideas for each meal. . What can you do before baby is born to prepare meals for the postpartum period? . How can others help you with meals? Marland Kitchen Which grocery stores provide online shopping and delivery? Marland Kitchen Which restaurants offer take-out or delivery options? ______________________________________________________________________________________________________________________________________________________________________________________________________________________________________________________________________________________________________________________________________________________________________________________________________  CARE FOR MOM *It's important that mom is cared for and pampered in the postpartum period. Remember, the most important ways new mothers need care are: sleep, nutrition, gentle exercise, and time off.* . Who can come take care of mom during this period? Make a list of people with their contact information. . List some activities that make you feel cared for, rested, and energized? Who can make sure you have opportunities to do these things? . Does mom have a space of her very own within your home that's just for her? Make a "[facility]" where she can be comfortable, rest, and renew herself daily. ______________________________________________________________________________________________________________________________________________________________________________________________________________________________________________________________________________________________________________________________________________________________________________________________________    CARE FOR AND FEEDING BABY *Knowledgeable and encouraging people will offer the best support with regard to  feeding your baby.* . Educate yourself and choose the best feeding option for your baby. . Make a list of people who will guide, support, and be a resource for you as your care for and feed your baby. (Friends that have breastfed or are currently breastfeeding, lactation consultants, breastfeeding support groups, etc.) . Consider a postpartum doula. (These websites can give you information: dona.org & BuyingShow.es) . Seek out local breastfeeding resources like the breastfeeding support group at [employer] or [employer]. ______________________________________________________________________________________________________________________________________________________________________________________________________________________________________________________________________________________________________________________________________________________________________________________________________  Verner Chol AND ERRANDS . Who can help with a thorough cleaning before baby is born? . Make a list of people who will help with housekeeping and chores, like laundry, light cleaning, dishes, bathrooms, etc. . Who can run some errands for you? Marland Kitchen What can you do to make sure you are stocked with basic supplies before baby is born? . Who is going to do the shopping? ______________________________________________________________________________________________________________________________________________________________________________________________________________________________________________________________________________________________________________________________________________________________________________________________________     Family Adjustment *Nurture yourselves.it helps parents be more loving and allows for better bonding  with their child.* . What sorts of things do you and partner enjoy doing together? Which activities help you to connect and strengthen your relationship?  Make a list of those things. Make a list of people whom you trust to care for your baby so you can have some time together as a couple. . What types of things help partner feel connected to Mom? Make a list. . What needs will partner have in order to bond with baby? . Other children? Who will care for them when you go into labor and while you are in the hospital? . Think about what the needs of your older children might be. Who can help you meet those needs? In what ways are you helping them prepare for bringing baby home? List some specific strategies you have for family adjustment. _______________________________________________________________________________________________________________________________________________________________________________________________________________________________________________________________________________________________________________________________________________  SUPPORT *Someone who can empathize with experiences normalizes your problems and makes them more bearable.* . Make a list of other friends, neighbors, and/or co-workers you know with infants (and small children, if applicable) with whom you can connect. . Make a list of local or online support groups, mom groups, etc. in which you can be involved. ______________________________________________________________________________________________________________________________________________________________________________________________________________________________________________________________________________________________________________________________________________________________________________________________________  Childcare Plans . Investigate and plan for childcare if mom is returning to work. . Talk about mom's concerns about her transition back to work. . Talk about partner's concerns regarding this transition.  Mental Health *Your mental health is one of the highest  priorities for a pregnant or postpartum mom.* . 1 in 5 women experience anxiety and/or depression from the time of conception through the first year after birth. . Postpartum Mood Disorders are the #1 complication of pregnancy and childbirth and the suffering experienced by these mothers is not necessary! These illnesses are temporary and respond well to treatment, which often includes self-care, social support, talk therapy, and medication when needed. . Women experiencing anxiety and depression often say things like: "I'm supposed to be happy.why do I feel so sad?", "Why can't I snap out of it?", "I'm having thoughts that scare me." . There is no need to be embarrassed if you are feeling these symptoms: o Overwhelmed, anxious, angry, sad, guilty, irritable, hopeless, exhausted but can't sleep o You are NOT alone. You are NOT to blame. With help, you WILL be well. . Where can I find help? Medical professionals such as your OB, midwife, gynecologist, family practitioner, primary care provider, pediatrician, or mental health providers; [facility] support groups: Feelings After Birth, Breastfeeding Support Group, Baby and Me Group, and Fit 4 Two exercise classes. . You have permission to ask for help. It will confirm your feelings, validate your experiences, share/learn coping strategies, and gain support and encouragement as you heal. You are important! BRAINSTORM . Make a list of local resources, including resources for mom and for partner. . Identify support groups. . Identify people to call late at night - include names and contact info. . Talk with partner about perinatal mood and anxiety disorders. . Talk with your OB, midwife, and doula about baby blues and about perinatal mood and anxiety disorders. . Talk with your pediatrician about perinatal mood and anxiety disorders.   Support & [employer]   What do you really need?  . Basics . In preparing for a new baby, many expectant  parents spend hours shopping for baby clothes, decorating the nursery, and deciding which car seat to buy. Yet most don't think much about what the reality of parenting a newborn will be like, and what  they need to make it through that. So, here is the advice of experienced parents. We know you'll read this, and think "they're exaggerating, I don't really need that." Just trust [place] on these, OK? Plan for all of this, and if it turns out you don't need it, come back and teach [place] how you did it!  Satira Anis (Once baby's survival needs are met, make sure you attend to your own survival needs!) . Sleep . An average newborn sleeps 16-18 hours per day, over 6-7 sleep periods, rarely more than three hours at a time. It is normal and healthy for a newborn to wake throughout the night... but really hard on parents!! . Naps. Prioritize sleep above any responsibilities like: cleaning house, visiting friends, running errands, etc.  Sleep whenever baby sleeps. If you can't nap, at least have restful times when baby eats. The more rest you get, the more patient you will be, the more emotionally stable, and better at solving problems.  . Food . You may not have realized it would be difficult to eat when you have a newborn. Yet, when we talk to . countless new parents, they say things like "it may be 2:00 pm when I realize I haven't had breakfast yet." Or "every time we sit down to dinner, baby needs to eat, and my food gets cold, so I don't bother to eat it." . Finger food. Before your baby is born, stock up with one months' worth of food that: 1) you can eat with one hand while holding a baby, 2) doesn't need to be prepped, 3) is good hot or cold, 4) doesn't spoil when left out for a few hours, and 5) you like to eat. Think about: nuts, dried fruit, Clif bars, pretzels, jerky, gogurt, baby carrots, apples, bananas, crackers, cheez-n-crackers, string cheese, hot pockets or frozen burritos to microwave, garden burgers  and breakfast pastries to put in the toaster, yogurt drinks, etc. . Restaurant Menus. Make lists of your favorite restaurants & menu items. When family/friends want to help, you can give specific information without much thought. They can either bring you the food or send gift cards for just the right meals. Rosaura Carpenter Meals.  Take some time to make a few meals to put in the freezer ahead of time.  Easy to freeze meals can be anything such as soup, lasagna, chicken pie, or spaghetti sauce. . Set up a Meal Schedule.  Ask friends and family to sign up to bring you meals during the first few weeks of being home. (It can be passed around at baby showers!) You have no idea how helpful this will be until you are in the throes of parenting.  https://hamilton-woodard.[email or web address] is a great website to check out. . Emotional Support . Know who to call when you're stressed out. Parenting a newborn is very challenging work. There are times when it totally overwhelms your normal coping abilities. EVERY NEW PARENT NEEDS TO HAVE A PLAN FOR WHO TO CALL WHEN THEY JUST CAN'T COPE ANY MORE. (And it has to be someone other than the baby's other parent!) Before your baby is born, come up with at least one person you can call for support - write their phone number down and post it on the refrigerator. Marland Kitchen Anxiety & Sadness. Baby blues are normal after pregnancy; however, there are more severe types of anxiety & sadness which can occur and should not be ignored.  They are always treatable, but you have to take  the first step by reaching out for help. [facility] offers a "Mom Talk" group which meets every Tuesday from 10 am - 11 am.  This group is for new moms who need support and connection after their babies are born.  Call [phone].  Marland Kitchen Really, Really Helpful (Plan for them! Make sure these happen often!!) . Physical Support with Taking Care of Yourselves . Asking friends and family. Before your baby is born, set up a schedule of people  who can come and visit and help out (or ask a friend to schedule for you). Any time someone says "let me know what I can do to help," sign them up for a day. When they get there, their job is not to take care of the baby (that's your job and your joy). Their job is to take care of you!  . Postpartum doulas. If you don't have anyone you can call on for support, look into postpartum doulas:  professionals at helping parents with caring for baby, caring for themselves, getting breastfeeding started, and helping with household tasks. www.padanc.org is a helpful website for learning about doulas in our area. . Peer Support / Parent Groups . Why: One of the greatest ideas for new parents is to be around other new parents. Parent groups give you a chance to share and listen to others who are going through the same season of life, get a sense of what is normal infant development by watching several babies learn and grow, share your stories of triumph and struggles with empathetic ears, and forgive your own mistakes when you realize all parents are learning by trial and error. . Where to find: There are many places you can meet other new parents throughout our community.  [facility] offers the following classes for new moms and their little ones:  Baby and Me (Birth to [place]) and Breastfeeding Support Group. Go to [email or web address] or call [phone] for more information. . Time for your Relationship . It's easy to get so caught up in meeting baby's immediate needs that it's hard to find time to connect with your partner, and meet the needs of your relationship. It's also easy to forget what "quality time with your partner" actually looks like. If you take your baby on a date, you'd be amazed how much of your couple time is spent feeding the baby, diapering the baby, admiring the baby, and talking about the baby. . Dating: Try to take time for just the two of you. Babysitter tip: Sometimes when moms  are breastfeeding a newborn, they find it hard to figure out how to schedule outings around baby's unpredictable feeding schedules. Have the babysitter come for a three hour period. When she comes over, if baby has just eaten, you can leave right away, and come back in two hours. If baby hasn't fed recently, you start the date at home. Once baby gets hungry and gets a good feeding in, you can head out for the rest of your date time. . Date Nights at Home: If you can't get out, at least set aside one evening a week to prioritize your relationship: whenever baby dozes off or doesn't have any immediate needs, spend a little time focusing on each other. . Potential conflicts: The main relationship conflicts that come up for new parents are: issues related to sexuality, financial stresses, a feeling of an unfair division of household tasks, and conflicts in parenting styles. The more you can work on these issues before baby  arrives, the better!  Clint Guy and Frills (Don't forget these. and don't feel guilty for indulging in them!) . Everyone has something in life that is a fun little treat that they do just for themselves. It may be: reading the morning paper, or going for a daily jog, or having coffee with a friend once a week, or going to a movie on Friday nights, or fine chocolates, or bubble baths, or curling up with a good book. . Unless you do fun things for yourself every now and then, it's hard to have the energy for fun with your baby. Whatever your "special" treats are, make sure you find a way to continue to indulge in them after your baby is born. These special moments can recharge you, and allow you to return to baby with a new joy   PERINATAL MOOD DISORDERS: [place]   Emergency and Crisis Resources:  If you are an imminent risk to self or others, are experiencing intense personal distress, and/or have noticed significant changes in  activities of daily living, call:  . 911 . [facility]: 773-173-4261 . Mobile Crisis: (430)736-1717 . National Suicide Hotline: (225) 528-5914 Or visit the following crisis centers: . Local Emergency Departments . [facility]: [address] [place]. Hours: 8:30AM-5PM. Insurance Accepted: Medicaid, Medicare, and Uninsured.  Marland Kitchen [facility]  [address] [place] Mon-Friday 8am-3pm  [phone]                                                                                    Non-Crisis Resources: To identify specific providers that are covered by your insurance, contact your insurance company or local agencies: [place]: [phone] [facility]: [phone] [place]: 8732869187 Postpartum Support International- [place] 1-562-376-0015                                                      Outpatient therapy and medication management providers:  [facility] [phone] Hours: 9AM-5PM  Insurance Accepted: Alben Spittle, Lorella Nimrod, Freddrick March, [place], Medicare  [facility]) [phone] Hours: 8AM-5PM  nsurance Accepted: All insurances EXCEPT AARP, [place], [place], and [place]: [phone]             Hours: 8AM-8PM Insurance Accepted: Cristal Ford, Freddrick March, [place], Medicare, [phone] [facility]: [phone] Hours: 8:30AM-7PM Insurance Accepted: Cristal Ford, Medicaid, Medicare, Tricare, [employer] Counseling:  [place] Accepted:  Holland Falling, Lorella Nimrod, [employer], [place], [facility] [phone] Hours: 9AM-5:30PM Insurance Accepted: Alben Spittle, Charlotte Crumb, and Medicaid, Medicare, [employer]  Counseling:  [phone] Hours: 9am-5pm Insurance Accepted: BCBS; they do not accept Medicaid/Medicare [place]: [phone] Hours: 9am-9pm Insurance Accepted: All major insurance including Medicaid and Medicare [place]  Counseling: [phone] Hours: 9AM-5:30PM Insurance Accepted: All insurances EXCEPT Medicaid and Medicare. [facility]: [place]: [phone] [place]:  [place] (support for children in the NICU and/or with special needs), [employer]: [phone]                                                                                     Online Resources: Postpartum Support International: http://jones-berg.com/  800-944-4PPD 2Moms Supporting Moms:  www.momssupportingmoms.net

## 2019-03-05 ENCOUNTER — Ambulatory Visit (INDEPENDENT_AMBULATORY_CARE_PROVIDER_SITE_OTHER): Payer: Medicaid Other | Admitting: *Deleted

## 2019-03-05 ENCOUNTER — Other Ambulatory Visit: Payer: Self-pay

## 2019-03-05 ENCOUNTER — Ambulatory Visit (INDEPENDENT_AMBULATORY_CARE_PROVIDER_SITE_OTHER): Payer: Medicaid Other | Admitting: Family Medicine

## 2019-03-05 ENCOUNTER — Other Ambulatory Visit (HOSPITAL_COMMUNITY)
Admission: RE | Admit: 2019-03-05 | Discharge: 2019-03-05 | Disposition: A | Discharge status: Home / Self Care | Payer: Medicaid Other | Source: Ambulatory Visit | Attending: Family Medicine | Admitting: Family Medicine

## 2019-03-05 ENCOUNTER — Telehealth (HOSPITAL_COMMUNITY): Payer: Self-pay | Admitting: *Deleted

## 2019-03-05 ENCOUNTER — Ambulatory Visit: Payer: Self-pay

## 2019-03-05 ENCOUNTER — Encounter (HOSPITAL_COMMUNITY): Payer: Self-pay | Admitting: *Deleted

## 2019-03-05 VITALS — BP 133/76 | HR 101 | Wt 276.1 lb

## 2019-03-05 DIAGNOSIS — O10013 Pre-existing essential hypertension complicating pregnancy, third trimester: Secondary | ICD-10-CM

## 2019-03-05 DIAGNOSIS — O98513 Other viral diseases complicating pregnancy, third trimester: Secondary | ICD-10-CM

## 2019-03-05 DIAGNOSIS — O24410 Gestational diabetes mellitus in pregnancy, diet controlled: Secondary | ICD-10-CM | POA: Diagnosis not present

## 2019-03-05 DIAGNOSIS — O09899 Supervision of other high risk pregnancies, unspecified trimester: Secondary | ICD-10-CM

## 2019-03-05 DIAGNOSIS — O0993 Supervision of high risk pregnancy, unspecified, third trimester: Secondary | ICD-10-CM

## 2019-03-05 DIAGNOSIS — O26899 Other specified pregnancy related conditions, unspecified trimester: Secondary | ICD-10-CM

## 2019-03-05 DIAGNOSIS — Z3A36 36 weeks gestation of pregnancy: Secondary | ICD-10-CM

## 2019-03-05 DIAGNOSIS — O0990 Supervision of high risk pregnancy, unspecified, unspecified trimester: Secondary | ICD-10-CM

## 2019-03-05 DIAGNOSIS — O10919 Unspecified pre-existing hypertension complicating pregnancy, unspecified trimester: Secondary | ICD-10-CM

## 2019-03-05 DIAGNOSIS — U071 COVID-19: Secondary | ICD-10-CM

## 2019-03-05 DIAGNOSIS — Z283 Underimmunization status: Secondary | ICD-10-CM

## 2019-03-05 DIAGNOSIS — Z6791 Unspecified blood type, Rh negative: Secondary | ICD-10-CM

## 2019-03-05 DIAGNOSIS — O26893 Other specified pregnancy related conditions, third trimester: Secondary | ICD-10-CM

## 2019-03-05 DIAGNOSIS — O10913 Unspecified pre-existing hypertension complicating pregnancy, third trimester: Secondary | ICD-10-CM

## 2019-03-05 DIAGNOSIS — O10912 Unspecified pre-existing hypertension complicating pregnancy, second trimester: Secondary | ICD-10-CM

## 2019-03-05 NOTE — Progress Notes (Signed)
Pt reports daily H/A which are usually relieved by Tylenol. She denies visual disturbances. Discussed today's elevated PHQ-9 and GAD-7 scores w/pt.  She states she is depressed - not much different than usual. Pt had visit w/Jamie McMannes on 1/29 and declines need for visit w/her today. She has appt w/psychiatrist tomorrow. External Cephalic version scheduled on 2/6 @ 0800.

## 2019-03-05 NOTE — Telephone Encounter (Signed)
Preadmission screen  

## 2019-03-05 NOTE — Progress Notes (Signed)

## 2019-03-05 NOTE — Progress Notes (Signed)
   PRENATAL VISIT NOTE  Subjective:  Katrina Stewart is a 24 y.o. G3P1011 at 49w3dbeing seen today for ongoing prenatal care.  She is currently monitored for the following issues for this high-risk pregnancy and has Alcohol use disorder; Severe episode of recurrent major depressive disorder, without psychotic features ([place]; Rh negative state in antepartum period; Rubella non-immune status, antepartum; Supervision of high risk pregnancy, antepartum; History of depression; Maternal obesity affecting pregnancy, antepartum; Chronic hypertension complicating or reason for care during pregnancy, second trimester; COVID-19 virus infection; UTI (urinary tract infection); Shortness of breath; and Gestational diabetes mellitus (GDM), antepartum on their problem list.  Patient reports no complaints.  Contractions: Irregular. Vag. Bleeding: None.  Movement: Present. Denies leaking of fluid.   The following portions of the patient's history were reviewed and updated as appropriate: allergies, current medications, past family history, past medical history, past social history, past surgical history and problem list.   Objective:   Vitals:   03/05/19 1426  BP: 133/76  Pulse: (!) 101  Weight: 276 lb 1.6 oz (125.2 kg)    Fetal Status: Fetal Heart Rate (bpm): NST   Movement: Present     General:  Alert, oriented and cooperative. Patient is in no acute distress.  Skin: Skin is warm and dry. No rash noted.   Cardiovascular: Normal heart rate noted  Respiratory: Normal respiratory effort, no problems with respiration noted  Abdomen: Soft, gravid, appropriate for gestational age.  Pain/Pressure: Present     Pelvic: Cervical exam performed        Extremities: Normal range of motion.  Edema: Trace  Mental Status: Normal mood and affect. Normal behavior. Normal judgment and thought content.   Assessment and Plan:  Pregnancy: G3P1011 at [redacted]w[redacted]d. Supervision of high risk pregnancy, antepartum FHT and FH  normal. Footling breech. Will schedule for version. - GC/Chlamydia probe amp ([place] Strep Gp B NAA  2. Chronic hypertension complicating or reason for care during pregnancy, second trimester BP controlled  3. Diet controlled gestational diabetes mellitus (GDM), antepartum CBGs controlled  4. Rh negative state in antepartum period Received rhogam  5. COVID-19 virus infection 11/2  6. Rubella non-immune status, antepartum MMR post delivery  Preterm labor symptoms and general obstetric precautions including but not limited to vaginal bleeding, contractions, leaking of fluid and fetal movement were reviewed in detail with the patient. Please refer to After Visit Summary for other counseling recommendations.   Return in about 1 week (around 03/12/2019) for Weekly as scheduled. Change HOB appts to in person.  Future Appointments  Date Time Provider DeRio Bravo2/03/2019  1:45 PM WHVan BurenSKorea WH-MFCUS MFC-US  03/06/2019  1:55 PM WHGarrettHQueensFC-US  03/12/2019  1:15 PM WOC-WOCA NST WOC-WOCA WOC  03/12/2019  3:35 PM Sparacino, HaDan EuropeDO WOC-WOCA WOC  03/19/2019  2:15 PM WOC-WOCA NST WOC-WOCA WOC  03/19/2019  3:55 PM StTruett MainlandDO WOC-WOCA WOCoral Springs2/22/2021  2:15 PM WOC-WOCA NST WOC-WOCA WOC  03/26/2019  3:35 PM StNehemiah SettleJaTanna SavoyDO WOC-WOCA WORollaDO

## 2019-03-06 ENCOUNTER — Encounter (HOSPITAL_COMMUNITY): Payer: Self-pay | Admitting: *Deleted

## 2019-03-06 ENCOUNTER — Ambulatory Visit (HOSPITAL_COMMUNITY): Payer: Medicaid Other | Admitting: *Deleted

## 2019-03-06 ENCOUNTER — Other Ambulatory Visit (HOSPITAL_COMMUNITY): Payer: Self-pay | Admitting: *Deleted

## 2019-03-06 ENCOUNTER — Ambulatory Visit (HOSPITAL_COMMUNITY)
Admission: RE | Admit: 2019-03-06 | Discharge: 2019-03-06 | Disposition: A | Discharge status: Home / Self Care | Payer: Medicaid Other | Source: Ambulatory Visit | Attending: Maternal & Fetal Medicine | Admitting: Maternal & Fetal Medicine

## 2019-03-06 DIAGNOSIS — O10013 Pre-existing essential hypertension complicating pregnancy, third trimester: Secondary | ICD-10-CM

## 2019-03-06 DIAGNOSIS — O10919 Unspecified pre-existing hypertension complicating pregnancy, unspecified trimester: Secondary | ICD-10-CM | POA: Diagnosis not present

## 2019-03-06 DIAGNOSIS — O99210 Obesity complicating pregnancy, unspecified trimester: Secondary | ICD-10-CM | POA: Diagnosis not present

## 2019-03-06 DIAGNOSIS — O360130 Maternal care for anti-D [Rh] antibodies, third trimester, not applicable or unspecified: Secondary | ICD-10-CM

## 2019-03-06 DIAGNOSIS — O99313 Alcohol use complicating pregnancy, third trimester: Secondary | ICD-10-CM | POA: Diagnosis not present

## 2019-03-06 DIAGNOSIS — O0990 Supervision of high risk pregnancy, unspecified, unspecified trimester: Secondary | ICD-10-CM | POA: Diagnosis not present

## 2019-03-06 DIAGNOSIS — O10912 Unspecified pre-existing hypertension complicating pregnancy, second trimester: Secondary | ICD-10-CM | POA: Insufficient documentation

## 2019-03-06 DIAGNOSIS — O99213 Obesity complicating pregnancy, third trimester: Secondary | ICD-10-CM | POA: Diagnosis not present

## 2019-03-06 DIAGNOSIS — Z3A36 36 weeks gestation of pregnancy: Secondary | ICD-10-CM | POA: Diagnosis not present

## 2019-03-06 DIAGNOSIS — Z8659 Personal history of other mental and behavioral disorders: Secondary | ICD-10-CM | POA: Diagnosis not present

## 2019-03-06 DIAGNOSIS — O24410 Gestational diabetes mellitus in pregnancy, diet controlled: Secondary | ICD-10-CM | POA: Diagnosis not present

## 2019-03-06 DIAGNOSIS — Z362 Encounter for other antenatal screening follow-up: Secondary | ICD-10-CM

## 2019-03-07 ENCOUNTER — Other Ambulatory Visit: Payer: Self-pay | Admitting: Advanced Practice Midwife

## 2019-03-07 LAB — STREP GP B NAA: Strep Gp B NAA: POSITIVE — AB

## 2019-03-07 LAB — GC/CHLAMYDIA PROBE AMP (~~LOC~~) NOT AT ARMC
Chlamydia: NEGATIVE
Comment: NEGATIVE
Comment: NORMAL
Neisseria Gonorrhea: NEGATIVE

## 2019-03-08 ENCOUNTER — Other Ambulatory Visit (HOSPITAL_COMMUNITY)
Admission: RE | Admit: 2019-03-08 | Discharge: 2019-03-08 | Disposition: A | Discharge status: Home / Self Care | Payer: Medicaid Other | Source: Ambulatory Visit | Attending: Obstetrics and Gynecology | Admitting: Obstetrics and Gynecology

## 2019-03-08 DIAGNOSIS — U071 COVID-19: Secondary | ICD-10-CM | POA: Insufficient documentation

## 2019-03-08 DIAGNOSIS — Z01812 Encounter for preprocedural laboratory examination: Secondary | ICD-10-CM | POA: Insufficient documentation

## 2019-03-08 LAB — SARS CORONAVIRUS 2 (TAT 6-24 HRS): SARS Coronavirus 2: POSITIVE — AB

## 2019-03-09 ENCOUNTER — Telehealth: Payer: Self-pay | Admitting: Obstetrics and Gynecology

## 2019-03-09 ENCOUNTER — Other Ambulatory Visit: Payer: Self-pay | Admitting: *Deleted

## 2019-03-09 ENCOUNTER — Telehealth: Payer: Self-pay

## 2019-03-09 DIAGNOSIS — O24419 Gestational diabetes mellitus in pregnancy, unspecified control: Secondary | ICD-10-CM

## 2019-03-09 DIAGNOSIS — U071 COVID-19: Secondary | ICD-10-CM

## 2019-03-09 NOTE — Telephone Encounter (Signed)
OB Telephone Note I was off today, but I was checking my inbasket and message sent to me this morning re: a patient's +COVID test and she apparently has an ECV with me tomorrow. In reviewing her chart, her most recent u/s shows her as cephalic. I called Ms. Coles and she has a negative ROS and I told her that, as of right now, to treat herself as a new + and to quarantine and let her contacts know and ED precautions given.  I told her she doesn't need to come in tomorrow. Next appointment is Monday afternoon and I told her that the clinic will touch base with her Monday morning re: if she needs to be virtual, antepartum testing, etc.  L&D called and aware to cancel her for tomorrow  Durene Romans MD Attending [facility] for [facility] (Faculty Practice) 03/09/2019 Time: [id number]

## 2019-03-09 NOTE — Progress Notes (Signed)
Apolonio Schneiders, RN at Dr. Ilda Basset office notified of pt's positive covid test. This information will be forwarded to Dr. Ilda Basset.   Jacqlyn Larsen, RN

## 2019-03-09 NOTE — Telephone Encounter (Signed)
Phone call received from [place] at [place] testing site to report pt has a positive COVID-19 test result. Ilda Basset, MD notified of result.

## 2019-03-10 ENCOUNTER — Inpatient Hospital Stay (HOSPITAL_COMMUNITY): Payer: Medicaid Other

## 2019-03-12 ENCOUNTER — Telehealth (INDEPENDENT_AMBULATORY_CARE_PROVIDER_SITE_OTHER): Payer: Medicaid Other | Admitting: Family Medicine

## 2019-03-12 ENCOUNTER — Other Ambulatory Visit: Payer: Self-pay

## 2019-03-12 ENCOUNTER — Other Ambulatory Visit: Payer: Medicaid Other

## 2019-03-12 ENCOUNTER — Other Ambulatory Visit: Payer: Self-pay | Admitting: Family Medicine

## 2019-03-12 VITALS — BP 135/89 | HR 104

## 2019-03-12 DIAGNOSIS — O99891 Other specified diseases and conditions complicating pregnancy: Secondary | ICD-10-CM | POA: Diagnosis not present

## 2019-03-12 DIAGNOSIS — O0993 Supervision of high risk pregnancy, unspecified, third trimester: Secondary | ICD-10-CM | POA: Diagnosis not present

## 2019-03-12 DIAGNOSIS — Z3A37 37 weeks gestation of pregnancy: Secondary | ICD-10-CM | POA: Diagnosis not present

## 2019-03-12 DIAGNOSIS — U071 COVID-19: Secondary | ICD-10-CM

## 2019-03-12 DIAGNOSIS — Z283 Underimmunization status: Secondary | ICD-10-CM

## 2019-03-12 DIAGNOSIS — Z2839 Other underimmunization status: Secondary | ICD-10-CM

## 2019-03-12 DIAGNOSIS — O26893 Other specified pregnancy related conditions, third trimester: Secondary | ICD-10-CM | POA: Diagnosis not present

## 2019-03-12 DIAGNOSIS — O0990 Supervision of high risk pregnancy, unspecified, unspecified trimester: Secondary | ICD-10-CM

## 2019-03-12 DIAGNOSIS — O24410 Gestational diabetes mellitus in pregnancy, diet controlled: Secondary | ICD-10-CM

## 2019-03-12 DIAGNOSIS — O10913 Unspecified pre-existing hypertension complicating pregnancy, third trimester: Secondary | ICD-10-CM | POA: Diagnosis not present

## 2019-03-12 DIAGNOSIS — O10912 Unspecified pre-existing hypertension complicating pregnancy, second trimester: Secondary | ICD-10-CM

## 2019-03-12 DIAGNOSIS — Z6791 Unspecified blood type, Rh negative: Secondary | ICD-10-CM

## 2019-03-12 NOTE — Progress Notes (Signed)
   TELEHEALTH VIRTUAL OBSTETRICS VISIT ENCOUNTER NOTE  I connected with Katrina Stewart on 03/12/19 at  3:35 PM EST by telephone at home and verified that I am speaking with the correct person using two identifiers.   I discussed the limitations, risks, security and privacy concerns of performing an evaluation and management service by telephone and the availability of in person appointments. I also discussed with the patient that there may be a patient responsible charge related to this service. The patient expressed understanding and agreed to proceed.  Subjective:  Katrina Stewart is a 24 y.o. G3P1011 at 44w3dbeing followed for ongoing prenatal care.  She is currently monitored for the following issues for this high-risk pregnancy and has Alcohol use disorder; Severe episode of recurrent major depressive disorder, without psychotic features ([place]; Rh negative state in antepartum period; Rubella non-immune status, antepartum; Supervision of high risk pregnancy, antepartum; History of depression; Maternal obesity affecting pregnancy, antepartum; Chronic hypertension complicating or reason for care during pregnancy, second trimester; COVID-19 virus infection; UTI (urinary tract infection); Shortness of breath; and Gestational diabetes mellitus (GDM), antepartum on their problem list.  Patient reports no complaints. Reports fetal movement. Denies any contractions, bleeding or leaking of fluid.   The following portions of the patient's history were reviewed and updated as appropriate: allergies, current medications, past family history, past medical history, past social history, past surgical history and problem list.   Objective:   General:  Alert, oriented and cooperative.   Mental Status: Normal mood and affect perceived. Normal judgment and thought content.  Rest of physical exam deferred due to type of encounter  Assessment and Plan:  Pregnancy: G3P1011 at 316w3d1. Supervision of high  risk pregnancy, antepartum - Continue routine prenatal care - IOL scheduled for 39 weeks (03/23/19) - Discussed Miles circuit - Most recent [place] baby is vertex  2. Rubella non-immune status, antepartum - pp MMR  3. Diet controlled gestational diabetes mellitus (GDM), antepartum - Reports good sugar control, fastings 80-90, post prandial 110s  4. Chronic hypertension complicating or reason for care during pregnancy, second trimester - Good BP control  5. COVID-19 virus infection - Had in November, admitted for pneumonia 11/5-10, tested + on 03/08/19 but is asymptomatic  6. Rh negative state in antepartum period - s/p rhogam  There are no diagnoses linked to this encounter. Term labor symptoms and general obstetric precautions including but not limited to vaginal bleeding, contractions, leaking of fluid and fetal movement were reviewed in detail with the patient.  I discussed the assessment and treatment plan with the patient. The patient was provided an opportunity to ask questions and all were answered. The patient agreed with the plan and demonstrated an understanding of the instructions. The patient was advised to call back or seek an in-person office evaluation/[facility] any [facility] or concerning symptoms. Please refer to After Visit Summary for other counseling recommendations.   I provided 13 minutes of non-face-to-face time during this encounter.  No follow-ups on file.  Future Appointments  Date Time Provider DeFish Camp2/15/2021  2:15 PM WOC-WOCA NST WOC-WOCA WOC  03/19/2019  3:55 PM StTruett MainlandDO WOC-WOCA WOValparais[place]2/22/2021  2:15 PM WOC-WOCA NST WOC-WOCA WOC  03/26/2019  3:35 PM Stinson, JaTanna SavoyDO WOC-WOCA WOGibslandor WoDean Foods CompanyCoForsythroup

## 2019-03-12 NOTE — Progress Notes (Signed)
I connected with  [name] on 03/12/19 at  3:35 PM EST by telephone and verified that I am speaking with the correct person using two identifiers.   I discussed the limitations, risks, security and privacy concerns of performing an evaluation and management service by telephone and the availability of in person appointments. I also discussed with the patient that there may be a patient responsible charge related to this service. The patient expressed understanding and agreed to proceed.  Bethanne Ginger, [place] 03/12/2019  3:38 PM

## 2019-03-12 NOTE — Progress Notes (Signed)
Induction orders placed  Merilyn Baba, DO OB Fellow, Faculty Practice 03/12/2019 4:35 PM

## 2019-03-12 NOTE — Patient Instructions (Signed)
The MilesCircuit  This circuit takes at least 90 minutes to complete so clear your schedule and make mental preparations so you can relax in your environment. The second step requires a lot of pillows so gather them up before beginning Before starting, you should empty your bladder! Have a nice drink nearby, and make sure it has a straw! If you are having contractions, this circuit should be done through contractions, try not to change positions between steps Before you begin...  "I named this 'circuit' after my friend Katrina Stewart, who shared and discussed it with me when I was working with a client whose labor seemed to be stalled out and no longer progressing... This circuit is useful to help get the baby lined up, ideally, in the "Left Occiput Anterior" (LOA) Position, both before labor begins and when some corrections need to be done during labor. Prenatally, this position set can help to rotate a baby. As a natural method of induction, this can help get things going if baby just needed a gentle nudge of position to set things off. To the best of my knowledge, this group of positions will not "hurt" a baby that is already lined up correctly." - Katrina Stewart   Step One: Open-knee Chest Stay in this position for 30 minutes, start in cat/cow, then drop your chest as low as you can to the bed or the floor and your bottom as high as you can. Knees should be fairly wide apart, and the angle between the torso/thighs should be wider than 90 degrees. Wiggle around, prop with lots of pillows and use this time to get totally relaxed. This position allows the baby to scoot out of the pelvis a bit and gives them room to rotate, shift their head position, etc. If the pregnant person finds it helpful, careful positioning with a rebozo under the belly, with gentle tension from a support person behind can help maintain this position for the full 30 minutes.  Step Two:Exaggerated Left Side  Lying Roll to your left side, bringing your top leg as high as possible and keeping your bottom leg straight. Roll forward as much as possible, again using a lot of pillows. Sink into the bed and relax some more. If you fall asleep, that's totally okay and you can stay there! If not, stay here for at least another half an hour. Try and get your top right leg up towards your head and get as rolled over onto your belly as much as possible. If you repeat the circuit during labor, try alternating left and right sides. We know the photo the left is actually right side... just flip the image in your head.  Step Three: Moving and Lunges Lunge, walk stairs facing sideways, 2 at a time, (have a spotter downstairs of you!), take a walk outside with one foot on the curb and the other on the street, sit on a birth ball and hula- anything that's upright and putting your pelvis in open, asymmetrical positions. Spend at least 30 minutes doing this one as well to give your baby a chance to move down. If you are lunging or stair or curb walking, you should lunge/walk/go up stairs in the direction that feels better to you. The key with the lunge is that the toes of the higher leg and mom's belly button should be at right angles. Do not lunge over your knee, that closes the pelvis.     Katrina Stewart: Circuit Creator - [email or web address] Katrina   Stewart, CD, BDT (DONA), LCCE, FACCE: Supporting Content - [email or web address] Emily Weaver Brown: Photography - [email or web address] Kate Dewey CD/CDT (BAI): Print and Webmaster - [email or web address] MilesCircuit [employer] Stewart [employer] [email or web address]  

## 2019-03-13 ENCOUNTER — Encounter: Payer: Self-pay | Admitting: Lactation Services

## 2019-03-13 ENCOUNTER — Telehealth (HOSPITAL_COMMUNITY): Payer: Self-pay | Admitting: *Deleted

## 2019-03-13 ENCOUNTER — Encounter (HOSPITAL_COMMUNITY): Payer: Self-pay

## 2019-03-13 ENCOUNTER — Ambulatory Visit (HOSPITAL_COMMUNITY): Payer: Medicaid Other

## 2019-03-13 DIAGNOSIS — B951 Streptococcus, group B, as the cause of diseases classified elsewhere: Secondary | ICD-10-CM

## 2019-03-13 HISTORY — DX: Streptococcus, group b, as the cause of diseases classified elsewhere: B95.1

## 2019-03-13 NOTE — Telephone Encounter (Signed)
Preadmission screen  

## 2019-03-16 ENCOUNTER — Other Ambulatory Visit: Payer: Self-pay | Admitting: Advanced Practice Midwife

## 2019-03-19 ENCOUNTER — Ambulatory Visit: Payer: Self-pay

## 2019-03-19 ENCOUNTER — Ambulatory Visit (INDEPENDENT_AMBULATORY_CARE_PROVIDER_SITE_OTHER): Payer: Medicaid Other | Admitting: Family Medicine

## 2019-03-19 ENCOUNTER — Other Ambulatory Visit: Payer: Self-pay

## 2019-03-19 ENCOUNTER — Encounter: Payer: Self-pay | Admitting: General Practice

## 2019-03-19 ENCOUNTER — Ambulatory Visit (INDEPENDENT_AMBULATORY_CARE_PROVIDER_SITE_OTHER): Payer: Medicaid Other | Admitting: *Deleted

## 2019-03-19 ENCOUNTER — Other Ambulatory Visit: Payer: Medicaid Other

## 2019-03-19 VITALS — BP 131/76 | HR 95 | Wt 278.5 lb

## 2019-03-19 DIAGNOSIS — O0993 Supervision of high risk pregnancy, unspecified, third trimester: Secondary | ICD-10-CM

## 2019-03-19 DIAGNOSIS — O24410 Gestational diabetes mellitus in pregnancy, diet controlled: Secondary | ICD-10-CM | POA: Diagnosis not present

## 2019-03-19 DIAGNOSIS — O99891 Other specified diseases and conditions complicating pregnancy: Secondary | ICD-10-CM

## 2019-03-19 DIAGNOSIS — O09899 Supervision of other high risk pregnancies, unspecified trimester: Secondary | ICD-10-CM

## 2019-03-19 DIAGNOSIS — O0990 Supervision of high risk pregnancy, unspecified, unspecified trimester: Secondary | ICD-10-CM

## 2019-03-19 DIAGNOSIS — Z3A38 38 weeks gestation of pregnancy: Secondary | ICD-10-CM

## 2019-03-19 DIAGNOSIS — O10013 Pre-existing essential hypertension complicating pregnancy, third trimester: Secondary | ICD-10-CM

## 2019-03-19 DIAGNOSIS — O10919 Unspecified pre-existing hypertension complicating pregnancy, unspecified trimester: Secondary | ICD-10-CM

## 2019-03-19 DIAGNOSIS — O24419 Gestational diabetes mellitus in pregnancy, unspecified control: Secondary | ICD-10-CM

## 2019-03-19 DIAGNOSIS — Z6791 Unspecified blood type, Rh negative: Secondary | ICD-10-CM

## 2019-03-19 DIAGNOSIS — Z283 Underimmunization status: Secondary | ICD-10-CM

## 2019-03-19 DIAGNOSIS — U071 COVID-19: Secondary | ICD-10-CM

## 2019-03-19 DIAGNOSIS — O10913 Unspecified pre-existing hypertension complicating pregnancy, third trimester: Secondary | ICD-10-CM

## 2019-03-19 NOTE — Progress Notes (Addendum)
   PRENATAL VISIT NOTE  Subjective:  Katrina Stewart is a 24 y.o. G3P1011 at 64w3dbeing seen today for ongoing prenatal care.  She is currently monitored for the following issues for this high-risk pregnancy and has Alcohol use disorder; Severe episode of recurrent major depressive disorder, without psychotic features ([place]; Rh negative state in antepartum period; Rubella non-immune status, antepartum; Supervision of high risk pregnancy, antepartum; History of depression; Maternal obesity affecting pregnancy, antepartum; Chronic hypertension complicating or reason for care during pregnancy, second trimester; COVID-19 virus infection; UTI (urinary tract infection); Shortness of breath; Gestational diabetes mellitus (GDM), antepartum; and Group B streptococcal infection in pregnancy on their problem list.  Patient reports no complaints.  Contractions: Irregular. Vag. Bleeding: None.  Movement: Present. Denies leaking of fluid.   The following portions of the patient's history were reviewed and updated as appropriate: allergies, current medications, past family history, past medical history, past social history, past surgical history and problem list.   Objective:   Vitals:   03/19/19 1321  BP: 131/76  Pulse: 95  Weight: 278 lb 8 oz (126.3 kg)    Fetal Status: Fetal Heart Rate (bpm): NST   Movement: Present     General:  Alert, oriented and cooperative. Patient is in no acute distress.  Skin: Skin is warm and dry. No rash noted.   Cardiovascular: Normal heart rate noted  Respiratory: Normal respiratory effort, no problems with respiration noted  Abdomen: Soft, gravid, appropriate for gestational age.  Pain/Pressure: Present     Pelvic: Cervical exam deferred        Extremities: Normal range of motion.  Edema: Trace  Mental Status: Normal mood and affect. Normal behavior. Normal judgment and thought content.   Assessment and Plan:  Pregnancy: G3P1011 at 376w3d. Supervision of high risk  pregnancy, antepartum FHT normal  2. Diet controlled gestational diabetes mellitus (GDM), antepartum Induction at 39 weeks Fastings controlled. 2hr PP 7 elevated NST reactive BPP 10/10   3. Chronic hypertension in pregnancy BP controlled  4. COVID-19 virus infection Had second positive test on 2/4. She's past 10 days and continues to be asymptomatic. She should be treated as negative  5. Rubella non-immune status, antepartum MMR PP  6. Rh negative state in antepartum period   Term labor symptoms and general obstetric precautions including but not limited to vaginal bleeding, contractions, leaking of fluid and fetal movement were reviewed in detail with the patient. Please refer to After Visit Summary for other counseling recommendations.   Return for IOL scheduled 03/23/19.  Future Appointments  Date Time Provider [place]2/15/2021  3:55 PM StJacob MooresOCoronado [facility] Center[facility]anc[facility]2/19/2021  8:25 AM MC-LD SCHED ROOM MC-INDC None    JaTruett MainlandDO

## 2019-03-19 NOTE — Progress Notes (Signed)
+  Covid on 2/4 - pt had no symptoms.  Pt is scheduled for IOL on 2/19

## 2019-03-19 NOTE — Progress Notes (Signed)
Pt informed that the ultrasound is considered a limited OB ultrasound and is not intended to be a complete ultrasound exam.  Patient also informed that the ultrasound is not being completed with the intent of assessing for fetal or placental anomalies or any pelvic abnormalities.  Explained that the purpose of today's ultrasound is to assess for presentation, BPP and amniotic fluid volume.  Patient acknowledges the purpose of the exam and the limitations of the study.    PHQ-9 score - 16 today. Pt states she is always depressed - no different now. She had visit w/psychiatrist last week and has follow up scheduled tomorrow.

## 2019-03-21 ENCOUNTER — Ambulatory Visit (HOSPITAL_COMMUNITY): Payer: Medicaid Other

## 2019-03-21 ENCOUNTER — Other Ambulatory Visit (HOSPITAL_COMMUNITY): Payer: Medicaid Other

## 2019-03-23 ENCOUNTER — Inpatient Hospital Stay (HOSPITAL_COMMUNITY): Payer: Medicaid Other | Admitting: Anesthesiology

## 2019-03-23 ENCOUNTER — Inpatient Hospital Stay (HOSPITAL_COMMUNITY): Payer: Medicaid Other

## 2019-03-23 ENCOUNTER — Inpatient Hospital Stay (HOSPITAL_COMMUNITY)
Admission: RE | Admit: 2019-03-23 | Discharge: 2019-03-25 | DRG: 807 | Disposition: A | Discharge status: Home / Self Care | Payer: Medicaid Other | Attending: Obstetrics & Gynecology | Admitting: Obstetrics & Gynecology

## 2019-03-23 ENCOUNTER — Other Ambulatory Visit: Payer: Self-pay

## 2019-03-23 DIAGNOSIS — O26893 Other specified pregnancy related conditions, third trimester: Secondary | ICD-10-CM | POA: Diagnosis present

## 2019-03-23 DIAGNOSIS — E669 Obesity, unspecified: Secondary | ICD-10-CM | POA: Diagnosis present

## 2019-03-23 DIAGNOSIS — Z283 Underimmunization status: Secondary | ICD-10-CM

## 2019-03-23 DIAGNOSIS — O1002 Pre-existing essential hypertension complicating childbirth: Principal | ICD-10-CM | POA: Diagnosis present

## 2019-03-23 DIAGNOSIS — O24420 Gestational diabetes mellitus in childbirth, diet controlled: Secondary | ICD-10-CM | POA: Diagnosis not present

## 2019-03-23 DIAGNOSIS — Z3490 Encounter for supervision of normal pregnancy, unspecified, unspecified trimester: Secondary | ICD-10-CM | POA: Diagnosis present

## 2019-03-23 DIAGNOSIS — O99824 Streptococcus B carrier state complicating childbirth: Secondary | ICD-10-CM | POA: Diagnosis not present

## 2019-03-23 DIAGNOSIS — O360130 Maternal care for anti-D [Rh] antibodies, third trimester, not applicable or unspecified: Secondary | ICD-10-CM | POA: Diagnosis not present

## 2019-03-23 DIAGNOSIS — O24419 Gestational diabetes mellitus in pregnancy, unspecified control: Secondary | ICD-10-CM | POA: Diagnosis present

## 2019-03-23 DIAGNOSIS — Z3A39 39 weeks gestation of pregnancy: Secondary | ICD-10-CM | POA: Diagnosis not present

## 2019-03-23 DIAGNOSIS — O10912 Unspecified pre-existing hypertension complicating pregnancy, second trimester: Secondary | ICD-10-CM | POA: Diagnosis present

## 2019-03-23 DIAGNOSIS — Z8616 Personal history of COVID-19: Secondary | ICD-10-CM | POA: Diagnosis not present

## 2019-03-23 DIAGNOSIS — B951 Streptococcus, group B, as the cause of diseases classified elsewhere: Secondary | ICD-10-CM | POA: Diagnosis present

## 2019-03-23 DIAGNOSIS — O99214 Obesity complicating childbirth: Secondary | ICD-10-CM | POA: Diagnosis present

## 2019-03-23 DIAGNOSIS — O26899 Other specified pregnancy related conditions, unspecified trimester: Secondary | ICD-10-CM

## 2019-03-23 DIAGNOSIS — O99210 Obesity complicating pregnancy, unspecified trimester: Secondary | ICD-10-CM | POA: Diagnosis present

## 2019-03-23 DIAGNOSIS — O10919 Unspecified pre-existing hypertension complicating pregnancy, unspecified trimester: Secondary | ICD-10-CM | POA: Diagnosis present

## 2019-03-23 DIAGNOSIS — Z2839 Other underimmunization status: Secondary | ICD-10-CM

## 2019-03-23 DIAGNOSIS — O24429 Gestational diabetes mellitus in childbirth, unspecified control: Secondary | ICD-10-CM | POA: Diagnosis not present

## 2019-03-23 DIAGNOSIS — Z6791 Unspecified blood type, Rh negative: Secondary | ICD-10-CM

## 2019-03-23 DIAGNOSIS — Z8659 Personal history of other mental and behavioral disorders: Secondary | ICD-10-CM

## 2019-03-23 DIAGNOSIS — O164 Unspecified maternal hypertension, complicating childbirth: Secondary | ICD-10-CM | POA: Diagnosis not present

## 2019-03-23 DIAGNOSIS — O09899 Supervision of other high risk pregnancies, unspecified trimester: Secondary | ICD-10-CM

## 2019-03-23 LAB — CBC
HCT: 33.7 % — ABNORMAL LOW (ref 36.0–46.0)
HCT: 33.5 % — ABNORMAL LOW (ref 36.0–46.0)
Hemoglobin: 10.3 g/dL — ABNORMAL LOW (ref 12.0–15.0)
Hemoglobin: 10.6 g/dL — ABNORMAL LOW (ref 12.0–15.0)
MCH: 21.8 pg — ABNORMAL LOW (ref 26.0–34.0)
MCH: 22.2 pg — ABNORMAL LOW (ref 26.0–34.0)
MCHC: 30.6 g/dL (ref 30.0–36.0)
MCHC: 31.6 g/dL (ref 30.0–36.0)
MCV: 71.4 fL — ABNORMAL LOW (ref 80.0–100.0)
MCV: 70.2 fL — ABNORMAL LOW (ref 80.0–100.0)
Platelets: 305 10*3/uL (ref 150–400)
Platelets: 326 10*3/uL (ref 150–400)
RBC: 4.72 MIL/uL (ref 3.87–5.11)
RBC: 4.77 MIL/uL (ref 3.87–5.11)
RDW: 16.8 % — ABNORMAL HIGH (ref 11.5–15.5)
RDW: 16.6 % — ABNORMAL HIGH (ref 11.5–15.5)
WBC: 8.1 10*3/uL (ref 4.0–10.5)
WBC: 11.6 10*3/uL — ABNORMAL HIGH (ref 4.0–10.5)
nRBC: 0 % (ref 0.0–0.2)
nRBC: 0 % (ref 0.0–0.2)

## 2019-03-23 LAB — TYPE AND SCREEN
ABO/RH(D): B NEG
Antibody Screen: NEGATIVE

## 2019-03-23 LAB — GLUCOSE, CAPILLARY
Glucose-Capillary: 80 mg/dL (ref 70–99)
Glucose-Capillary: 84 mg/dL (ref 70–99)
Glucose-Capillary: 65 mg/dL — ABNORMAL LOW (ref 70–99)
Glucose-Capillary: 75 mg/dL (ref 70–99)

## 2019-03-23 LAB — RPR: RPR Ser Ql: NONREACTIVE

## 2019-03-23 LAB — COMPREHENSIVE METABOLIC PANEL
ALT: 11 U/L (ref 0–44)
AST: 14 U/L — ABNORMAL LOW (ref 15–41)
Albumin: 3 g/dL — ABNORMAL LOW (ref 3.5–5.0)
Alkaline Phosphatase: 96 U/L (ref 38–126)
Anion gap: 11 (ref 5–15)
BUN: 8 mg/dL (ref 6–20)
CO2: 18 mmol/L — ABNORMAL LOW (ref 22–32)
Calcium: 9.3 mg/dL (ref 8.9–10.3)
Chloride: 109 mmol/L (ref 98–111)
Creatinine, Ser: 0.59 mg/dL (ref 0.44–1.00)
GFR calc Af Amer: >60 mL/min (ref >60)
GFR calc non Af Amer: >60 mL/min (ref >60)
Glucose, Bld: 80 mg/dL (ref 70–99)
Potassium: 3.6 mmol/L (ref 3.5–5.1)
Sodium: 138 mmol/L (ref 135–145)
Total Bilirubin: 0.5 mg/dL (ref 0.3–1.2)
Total Protein: 6.7 g/dL (ref 6.5–8.1)

## 2019-03-23 LAB — PROTEIN / CREATININE RATIO, URINE
Creatinine, Urine: 129.15 mg/dL
Protein Creatinine Ratio: 0.08 mg/mg{Cre} (ref 0.00–0.15)
Total Protein, Urine: 10 mg/dL

## 2019-03-23 MED ORDER — OXYCODONE-ACETAMINOPHEN 5-325 MG PO TABS: 2.0000 | ORAL_TABLET | ORAL | Status: DC | PRN

## 2019-03-23 MED ORDER — LACTATED RINGERS IV SOLN: INTRAVENOUS | Status: DC

## 2019-03-23 MED ORDER — OXYTOCIN 40 UNITS IN NORMAL SALINE INFUSION - SIMPLE MED
2.5000 [IU]/h | INTRAVENOUS | Status: DC
  Filled 2019-03-23: qty 1000

## 2019-03-23 MED ORDER — OXYCODONE-ACETAMINOPHEN 5-325 MG PO TABS: 1.0000 | ORAL_TABLET | ORAL | Status: DC | PRN

## 2019-03-23 MED ORDER — ACETAMINOPHEN 325 MG PO TABS: 650.0000 mg | ORAL_TABLET | ORAL | Status: DC | PRN

## 2019-03-23 MED ORDER — LIDOCAINE HCL (PF) 1 % IJ SOLN
INTRAMUSCULAR | Status: DC | PRN
  Administered 2019-03-23: 8 mL via EPIDURAL

## 2019-03-23 MED ORDER — SODIUM CHLORIDE (PF) 0.9 % IJ SOLN
INTRAMUSCULAR | Status: DC | PRN
  Administered 2019-03-23: 12 mL/h via EPIDURAL

## 2019-03-23 MED ORDER — SODIUM CHLORIDE 0.9 % IV SOLN
5.0000 10*6.[IU] | Freq: Once | INTRAVENOUS | Status: AC
  Administered 2019-03-23: 5 10*6.[IU] via INTRAVENOUS
  Filled 2019-03-23: qty 5

## 2019-03-23 MED ORDER — PENICILLIN G POT IN DEXTROSE 60000 UNIT/ML IV SOLN
3.0000 10*6.[IU] | INTRAVENOUS | Status: DC
  Administered 2019-03-23 (×3): 3 10*6.[IU] via INTRAVENOUS
  Filled 2019-03-23 (×4): qty 50

## 2019-03-23 MED ORDER — PHENYLEPHRINE 40 MCG/ML (10ML) SYRINGE FOR IV PUSH (FOR BLOOD PRESSURE SUPPORT): 80.0000 ug | PREFILLED_SYRINGE | INTRAVENOUS | Status: DC | PRN

## 2019-03-23 MED ORDER — FENTANYL CITRATE (PF) 100 MCG/2ML IJ SOLN
100.0000 ug | INTRAMUSCULAR | Status: DC | PRN
  Administered 2019-03-23 (×2): 100 ug via INTRAVENOUS
  Filled 2019-03-23: qty 2

## 2019-03-23 MED ORDER — LIDOCAINE HCL (PF) 1 % IJ SOLN: 30.0000 mL | INTRAMUSCULAR | Status: DC | PRN

## 2019-03-23 MED ORDER — TERBUTALINE SULFATE 1 MG/ML IJ SOLN: 0.2500 mg | Freq: Once | INTRAMUSCULAR | Status: DC

## 2019-03-23 MED ORDER — LACTATED RINGERS IV SOLN
500.0000 mL | INTRAVENOUS | Status: DC | PRN
  Administered 2019-03-24 (×2): 500 mL via INTRAVENOUS

## 2019-03-23 MED ORDER — MISOPROSTOL 50MCG HALF TABLET
50.0000 ug | ORAL_TABLET | ORAL | Status: DC
  Administered 2019-03-23 (×2): 50 ug via BUCCAL
  Filled 2019-03-23 (×3): qty 1

## 2019-03-23 MED ORDER — SOD CITRATE-CITRIC ACID 500-334 MG/5ML PO SOLN: 30.0000 mL | ORAL | Status: DC | PRN

## 2019-03-23 MED ORDER — OXYTOCIN 40 UNITS IN NORMAL SALINE INFUSION - SIMPLE MED
1.0000 m[IU]/min | INTRAVENOUS | Status: DC
  Administered 2019-03-23: 2 m[IU]/min via INTRAVENOUS

## 2019-03-23 MED ORDER — FENTANYL-BUPIVACAINE-NACL 0.5-0.125-0.9 MG/250ML-% EP SOLN
12.0000 mL/h | EPIDURAL | Status: DC | PRN
  Filled 2019-03-23: qty 250

## 2019-03-23 MED ORDER — LACTATED RINGERS IV SOLN
500.0000 mL | Freq: Once | INTRAVENOUS | Status: AC
  Administered 2019-03-23: 19:00:00 500 mL via INTRAVENOUS

## 2019-03-23 MED ORDER — DIPHENHYDRAMINE HCL 50 MG/ML IJ SOLN: 12.5000 mg | INTRAMUSCULAR | Status: DC | PRN

## 2019-03-23 MED ORDER — TERBUTALINE SULFATE 1 MG/ML IJ SOLN
0.2500 mg | Freq: Once | INTRAMUSCULAR | Status: DC | PRN
  Filled 2019-03-23: qty 1

## 2019-03-23 MED ORDER — ONDANSETRON HCL 4 MG/2ML IJ SOLN
4.0000 mg | Freq: Four times a day (QID) | INTRAMUSCULAR | Status: DC | PRN
  Administered 2019-03-23: 4 mg via INTRAVENOUS
  Filled 2019-03-23: qty 2

## 2019-03-23 MED ORDER — EPHEDRINE 5 MG/ML INJ: 10.0000 mg | INTRAVENOUS | Status: DC | PRN

## 2019-03-23 MED ORDER — FENTANYL CITRATE (PF) 100 MCG/2ML IJ SOLN
INTRAMUSCULAR | Status: AC
  Filled 2019-03-23: qty 2

## 2019-03-23 MED ORDER — OXYTOCIN BOLUS FROM INFUSION: 500.0000 mL | Freq: Once | INTRAVENOUS | Status: DC

## 2019-03-23 MED ORDER — MISOPROSTOL 50MCG HALF TABLET
ORAL_TABLET | ORAL | Status: AC
  Filled 2019-03-23: qty 1

## 2019-03-23 NOTE — H&P (Signed)
OBSTETRIC ADMISSION HISTORY AND PHYSICAL  Katrina Stewart is a 24 y.o. female G3P1011 with IUP at 4w0dby 6 wk [place]oreapresenting for IOL secondary to cHTN (no meds) and GDMA1. She reports +FMs, No LOF, no VB, no blurry vision, headaches or peripheral edema, and RUQ pain.  She plans on breast feeding. She is undecided for birth control. [facility]  Dating: By 6 wk [place]--->  Estimated Date of Delivery: 03/30/19  Sono: 03/06/2019    '@[redacted]w[redacted]d'$ , CWD, normal anatomy, cephalic presentation, posterior placental lie, 2648g, 22% EFW  Prenatal History/Complications: COVID in November 2020 with admit for pneumonia GDMA1 CHTN not on medication GBS positive Rh negative  Maternal Obesity Hx of depression  Past Medical History: Past Medical History:  Diagnosis Date  . Anxiety   . COVID-19 virus detected   . Depression   . Vaginal Pap smear, abnormal     Past Surgical History: Past Surgical History:  Procedure Laterality Date  . BREAST SURGERY  2013   Lt & Rt excision juvenile fibroadenoma  . MASS EXCISION  10/29/2011   Procedure: EXCISION MASS;  Surgeon: DHarl Bowie MD;  Location: [facility];  Service: General;  Laterality: Bilateral;  excision of bilateral breast masses    Obstetrical History: OB History    Gravida  3   Para  1   Term  1   Preterm      AB  1   Living  1     SAB  1   TAB  0   Ectopic      Multiple  0   Live Births  1           Social History: Social History   Socioeconomic History  . Marital status: Single    Spouse name: Not on file  . Number of children: Not on file  . Years of education: Not on file  . Highest education level: Not on file  Occupational History    Comment: unemployed  Tobacco Use  . Smoking status: Never Smoker  . Smokeless tobacco: Never Used  Substance and Sexual Activity  . Alcohol use: Not Currently    Alcohol/week: 2.0 - 3.0 standard drinks    Types: 2 - 3 Shots of liquor per week     Comment: every weekend before pregnancy (4-5 drinks)  . Drug use: No  . Sexual activity: Not Currently    Birth control/protection: None  Other Topics Concern  . Not on file  Social History Narrative  . Not on file   Social Determinants of Health   Financial Resource Strain:   . Difficulty of Paying Living Expenses: Not on file  Food Insecurity: No Food Insecurity  . Worried About RCharity fundraiserin the Last Year: Never true  . Ran Out of Food in the Last Year: Never true  Transportation Needs: Unmet Transportation Needs  . Lack of Transportation (Medical): Yes  . Lack of Transportation (Non-Medical): Yes  Physical Activity:   . Days of Exercise per Week: Not on file  . Minutes of Exercise per Session: Not on file  Stress:   . Feeling of Stress : Not on file  Social Connections:   . Frequency of Communication with Friends and Family: Not on file  . Frequency of Social Gatherings with Friends and Family: Not on file  . Attends Religious Services: Not on file  . Active Member of Clubs or Organizations: Not on file  .  Attends Archivist Meetings: Not on file  . Marital Status: Not on file    Family History: Family History  Problem Relation Age of Onset  . Cancer Maternal Grandmother        lung cancer  . Cancer Other        bladder cancer  . Asthma Brother     Allergies: No Known Allergies  Medications Prior to Admission  Medication Sig Dispense Refill Last Dose  . Accu-Chek Softclix Lancets lancets 4 times daily 100 each 12   . acetaminophen (TYLENOL) 500 MG tablet Take 1,000 mg by mouth every 6 (six) hours as needed for moderate pain.     Marland Kitchen aspirin EC 81 MG tablet Take 1 tablet (81 mg total) by mouth daily. 90 tablet 1   . Blood Glucose Monitoring Suppl (ACCU-CHEK GUIDE) w/Device KIT 1 Device by Does not apply route daily. Use as directed 1 kit 0   . Blood Pressure Monitoring (BLOOD PRESSURE KIT) DEVI 1 Device by Does not apply route as needed. ICD 10:  Z39.90 1 Device 0   . Elastic Bandages & Supports (COMFORT FIT MATERNITY SUPP MED) MISC 1 Device by Does not apply route daily. 1 each 0   . ferrous sulfate 325 (65 FE) MG tablet Take 1 tablet (325 mg total) by mouth daily. 30 tablet 3   . glucose blood (ACCU-CHEK GUIDE) test strip For use 4 times a day 100 each 12   . ondansetron (ZOFRAN-ODT) 4 MG disintegrating tablet Take 1 tablet (4 mg total) by mouth every 8 (eight) hours as needed for nausea or vomiting. 60 tablet 2   . prenatal vitamin w/FE, FA (PRENATAL 1 + 1) 27-1 MG TABS tablet Take 1 tablet by mouth daily at 12 noon. 30 tablet 11   . sertraline (ZOLOFT) 50 MG tablet Take 2 tablets (100 mg total) by mouth daily. 60 tablet 3      Review of Systems   All systems reviewed and negative except as stated in HPI  Blood pressure 126/86, pulse 91, temperature 98 F (36.7 C), temperature source Oral, resp. rate 16, height '5\' 9"'$  (1.753 m), weight 125.2 kg, last menstrual period 06/23/2018. General appearance: alert, cooperative, appears stated age and no distress Lungs: normal effort Heart: regular rate  Abdomen: soft, non-tender; bowel sounds normal Pelvic: gravid uterus GU: No vaginal lesions  Extremities: Homans sign is negative, no sign of DVT Presentation: cephalic Fetal monitoringBaseline: 125 bpm, Variability: Good {> 6 bpm), Accelerations: Reactive and Decelerations: Absent Uterine activity: Frequency: Every 3 minutes Dilation: 1 Effacement (%): 50 Station: -3 Exam by:: Katrina Stewart   Prenatal labs: ABO, Rh: --/--/B NEG (11/06 1230) Antibody: Negative (12/04 0925) Rubella: 1.54 (08/13 1612) RPR: Non Reactive (12/04 0925)  HBsAg: Negative (08/13 1612)  HIV: Non Reactive (12/04 0925)  GBS: --Katrina Stewart (02/01 1547)  2 hr Glucola abnormal Genetic screening  normal Anatomy US WNL  Prenatal Transfer Tool  Maternal Diabetes: Yes:  Diabetes Type:  Diet controlled Genetic Screening: Normal Maternal Ultrasounds/Referrals:  Normal Fetal Ultrasounds or other Referrals:  None Maternal Substance Abuse:  No Significant Maternal Medications:  None Significant Maternal Lab Results: Group B Strep positive and Rh negative  No results found for this or any previous visit (from the past 24 hour(s)).  Patient Active Problem List   Diagnosis Date Noted  . Encounter for induction of labor 03/23/2019  . [redacted] weeks gestation of pregnancy 03/23/2019  . Group B streptococcal infection in pregnancy 03/13/2019  .  Gestational diabetes mellitus (GDM), antepartum 01/08/2019  . Shortness of breath   . UTI (urinary tract infection) 12/07/2018  . COVID-19 virus infection 12/05/2018  . Chronic hypertension complicating or reason for care during pregnancy, second trimester 11/20/2018  . Maternal obesity affecting pregnancy, antepartum 09/14/2018  . Supervision of high risk pregnancy, antepartum 08/29/2018  . History of depression 08/29/2018  . Rh negative state in antepartum period 09/03/2015  . Rubella non-immune status, antepartum 09/03/2015  . Severe episode of recurrent major depressive disorder, without psychotic features ([place])   . Alcohol use disorder     Assessment/Plan:  Katrina Stewart is a 25 y.o. G3P1011 at 90w0dhere for IOL for cHTN and GDMA1.  #Labor: Vertex by BSUS. Foley bulb placed manually and inflated with 60 mL water. Cytotec given. AROM/Pitocin as indicated. Anticipate SVD. #Pain: Per patient request  #FWB: Cat I; EFW: 3300g #ID:  GBS pos; PCN  #MOF: Breast #MOC: Undecided #Circ:  Outpatient  #GDMA1: CBG's q4h latent and q2h active labor #cHTN: Not on medications. Normal range currently. CMP WNL. Pr/Cr ordered.   CBarrington Ellison MD OAvent[facility] Hospital And Medical CenterFamily Medicine Fellow, FMedical City Dentonfor [facility] CClear SpringGroup 03/23/2019, 9:01 AM

## 2019-03-23 NOTE — Progress Notes (Signed)
Adalyn Creed is a 24 y.o. G3P1011 at [redacted]w[redacted]d admitted for IOL 2/2 cHTN and A1GDM  Subjective: Comfortable with epidural. Denies HA, SOB, vision changes, or RUQ pain.  Objective: BP 129/65   Pulse 84   Temp 98.2 F (36.8 C) (Oral)   Resp 16   Ht 5\' 9"  (1.753 m)   Wt 125.2 kg   LMP 06/23/2018 (Exact Date)   SpO2 100%   BMI 40.76 kg/m  No intake/output data recorded.  FHT:  FHR: 125 bpm, variability: moderate,  accelerations:  Present,  decelerations:  Absent UC:   Unable to be picked up on TOCO  SVE:   Dilation: 4.5 Effacement (%): 60 Station: -3 Exam by:: C Cornetto RN  Pitocin @ 4 mu/min  Labs: Lab Results  Component Value Date   WBC 11.6 (H) 03/23/2019   HGB 10.6 (L) 03/23/2019   HCT 33.5 (L) 03/23/2019   MCV 70.2 (L) 03/23/2019   PLT 326 03/23/2019    Assessment / Plan: 24 y.o. G3P1011 [redacted]w[redacted]d here for IOL for cHTN and GDMA1.  #Labor: S/p Cytotec x2, Foley bulb, AROM. Pitocin at 4. No cervical change so IUPC placed at this check, continue to titrate pitocin. Anticipate SVD. #Pain: comfortable with epidural #FWB: Cat I #GBS positive; PCN #GDMA1: last glucose 65 #cHTN: Labs WNL. BP's currently normal range. Asymptomatic.   Merilyn Baba DO OB Fellow, Faculty Practice 03/23/2019, 9:20 PM

## 2019-03-23 NOTE — Progress Notes (Signed)
Labor Progress Note Katrina Stewart is a 24 y.o. G3P1011 at [redacted]w[redacted]d presented for IOL for cHTN, GDMA1. S: Feeling more uncomfortable.   O:  BP 130/76   Pulse 83   Temp 98 F (36.7 C) (Oral)   Resp 16   Ht 5\' 9"  (1.753 m)   Wt 125.2 kg   LMP 06/23/2018 (Exact Date)   BMI 40.76 kg/m  EFM: 145, moderate variability, pos accels, no decels, reactive TOCO: q2-73m  CVE: Dilation: 4.5 Effacement (%): 60 Station: -2 Presentation: Vertex Exam by:: Apolonio Cutting   A&P: 24 y.o. [id number] [redacted]w[redacted]d here for IOL for cHTN and GDMA1. #Labor: Progressing well. S/p Cytotec x2 and Foley bulb now out. AROM with scant clear fluid at this exam. Will start Pitocin. Anticipate SVD. #Pain: per patient request #FWB: Cat I #GBS positive; PCN #GDMA1: last glucose 80 #cHTN: Labs WNL. BP's currently normal range. Asymptomatic.   Chauncey Mann, MD 4:35 PM

## 2019-03-23 NOTE — Anesthesia Preprocedure Evaluation (Signed)
Anesthesia Evaluation  Patient identified by MRN, date of birth, ID band Patient awake    Reviewed: Allergy & Precautions, H&P , NPO status , Patient's Chart, lab work & pertinent test results, reviewed documented beta blocker date and time   Airway Mallampati: II  TM Distance: >3 FB Neck ROM: full    Dental no notable dental hx.    Pulmonary shortness of breath,    Pulmonary exam normal breath sounds clear to auscultation       Cardiovascular hypertension, Normal cardiovascular exam Rhythm:regular Rate:Normal     Neuro/Psych PSYCHIATRIC DISORDERS Anxiety Depression negative neurological ROS     GI/Hepatic negative GI ROS, Neg liver ROS,   Endo/Other  diabetes, Gestational  Renal/GU negative Renal ROS  negative genitourinary   Musculoskeletal   Abdominal (+) + obese,   Peds  Hematology negative hematology ROS (+)   Anesthesia Other Findings   Reproductive/Obstetrics (+) Pregnancy                             Anesthesia Physical Anesthesia Plan  ASA: III  Anesthesia Plan: Epidural   Post-op Pain Management:    Induction:   PONV Risk Score and Plan:   Airway Management Planned:   Additional Equipment:   Intra-op Plan:   Post-operative Plan:   Informed Consent: I have reviewed the patients History and Physical, chart, labs and discussed the procedure including the risks, benefits and alternatives for the proposed anesthesia with the patient or authorized representative who has indicated his/her understanding and acceptance.     Dental Advisory Given  Plan Discussed with: Anesthesiologist  Anesthesia Plan Comments: (Labs checked- platelets confirmed with RN in room. Fetal heart tracing, per RN, reported to be stable enough for sitting procedure. Discussed epidural, and patient consents to the procedure:  included risk of possible headache,backache, failed block, allergic  reaction, and nerve injury. This patient was asked if she had any questions or concerns before the procedure started.)        Anesthesia Quick Evaluation

## 2019-03-23 NOTE — Progress Notes (Signed)
Labor Progress Note Katrina Stewart is a 24 y.o. G3P1011 at [redacted]w[redacted]d presented for IOL for cHTN, GDMA1. S: Feeling ctx.   O:  BP 120/80   Pulse 84   Temp 98 F (36.7 C) (Oral)   Resp 16   Ht 5\' 9"  (1.753 m)   Wt 125.2 kg   LMP 06/23/2018 (Exact Date)   BMI 40.76 kg/m  EFM: 145, moderate variability, pos accels, no decels, reactive TOCO: q2-60m  CVE: Dilation: 1 Effacement (%): 50 Station: -3 Presentation: Vertex Exam by:: Azul Brumett   A&P: 24 y.o. [id number] [redacted]w[redacted]d here for IOL for cHTN and GDMA1. #Labor: Progressing well. S/p Cytotec x1. Foley bulb still in place. Will give second Cytotec. AROM/Pitocin as indicated. Anticipate SVD. #Pain: per patient request #FWB: Cat I #GBS positive; PCN #GDMA1: last glucose 80 #cHTN: Labs WNL. BP's currently normal range. Asymptomatic.   Chauncey Mann, MD 12:28 PM

## 2019-03-23 NOTE — Anesthesia Procedure Notes (Signed)
Epidural Patient location during procedure: OB Start time: 03/23/2019 7:06 PM End time: 03/23/2019 8:10 PM  Staffing Anesthesiologist: Janeece Riggers, MD  Preanesthetic Checklist Completed: patient identified, IV checked, site marked, risks and benefits discussed, surgical consent, monitors and equipment checked, pre-op evaluation and timeout performed  Epidural Patient position: sitting Prep: DuraPrep and site prepped and draped Patient monitoring: continuous pulse ox and blood pressure Approach: midline Location: L3-L4 Injection technique: LOR air  Needle:  Needle type: Tuohy  Needle gauge: 17 G Needle length: 9 cm and 9 Needle insertion depth: 6 cm Catheter type: closed end flexible Catheter size: 19 Gauge Catheter at skin depth: 11 cm Test dose: negative  Assessment Events: blood not aspirated, injection not painful, no injection resistance, no paresthesia and negative IV test

## 2019-03-24 ENCOUNTER — Encounter (HOSPITAL_COMMUNITY): Payer: Self-pay | Admitting: Obstetrics and Gynecology

## 2019-03-24 DIAGNOSIS — O360130 Maternal care for anti-D [Rh] antibodies, third trimester, not applicable or unspecified: Secondary | ICD-10-CM

## 2019-03-24 DIAGNOSIS — O24419 Gestational diabetes mellitus in pregnancy, unspecified control: Secondary | ICD-10-CM

## 2019-03-24 DIAGNOSIS — R030 Elevated blood-pressure reading, without diagnosis of hypertension: Secondary | ICD-10-CM

## 2019-03-24 DIAGNOSIS — O24420 Gestational diabetes mellitus in childbirth, diet controlled: Secondary | ICD-10-CM

## 2019-03-24 DIAGNOSIS — Z3A39 39 weeks gestation of pregnancy: Secondary | ICD-10-CM

## 2019-03-24 DIAGNOSIS — O1002 Pre-existing essential hypertension complicating childbirth: Secondary | ICD-10-CM

## 2019-03-24 DIAGNOSIS — O99824 Streptococcus B carrier state complicating childbirth: Secondary | ICD-10-CM

## 2019-03-24 LAB — CBC WITH DIFFERENTIAL/PLATELET
Abs Immature Granulocytes: 0.04 10*3/uL (ref 0.00–0.07)
Basophils Absolute: 0 10*3/uL (ref 0.0–0.1)
Basophils Relative: 0 %
Eosinophils Absolute: 0 10*3/uL (ref 0.0–0.5)
Eosinophils Relative: 0 %
HCT: 34.1 % — ABNORMAL LOW (ref 36.0–46.0)
Hemoglobin: 10.6 g/dL — ABNORMAL LOW (ref 12.0–15.0)
Immature Granulocytes: 0 %
Lymphocytes Relative: 15 %
Lymphs Abs: 1.4 10*3/uL (ref 0.7–4.0)
MCH: 22 pg — ABNORMAL LOW (ref 26.0–34.0)
MCHC: 31.1 g/dL (ref 30.0–36.0)
MCV: 70.9 fL — ABNORMAL LOW (ref 80.0–100.0)
Monocytes Absolute: 0.8 10*3/uL (ref 0.1–1.0)
Monocytes Relative: 9 %
Neutro Abs: 7.2 10*3/uL (ref 1.7–7.7)
Neutrophils Relative %: 76 %
Platelets: 299 10*3/uL (ref 150–400)
RBC: 4.81 MIL/uL (ref 3.87–5.11)
RDW: 16.6 % — ABNORMAL HIGH (ref 11.5–15.5)
WBC: 9.5 10*3/uL (ref 4.0–10.5)
nRBC: 0 % (ref 0.0–0.2)

## 2019-03-24 LAB — GLUCOSE, CAPILLARY: Glucose-Capillary: 91 mg/dL (ref 70–99)

## 2019-03-24 MED ORDER — DIBUCAINE (PERIANAL) 1 % EX OINT: 1.0000 "application " | TOPICAL_OINTMENT | CUTANEOUS | Status: DC | PRN

## 2019-03-24 MED ORDER — SIMETHICONE 80 MG PO CHEW: 80.0000 mg | CHEWABLE_TABLET | ORAL | Status: DC | PRN

## 2019-03-24 MED ORDER — PRENATAL MULTIVITAMIN CH
1.0000 | ORAL_TABLET | Freq: Every day | ORAL | Status: DC
  Administered 2019-03-24 – 2019-03-25 (×2): 1 via ORAL
  Filled 2019-03-24 (×2): qty 1

## 2019-03-24 MED ORDER — ACETAMINOPHEN 325 MG PO TABS: 650.0000 mg | ORAL_TABLET | ORAL | Status: DC | PRN

## 2019-03-24 MED ORDER — COCONUT OIL OIL: 1.0000 "application " | TOPICAL_OIL | Status: DC | PRN

## 2019-03-24 MED ORDER — SENNOSIDES-DOCUSATE SODIUM 8.6-50 MG PO TABS
2.0000 | ORAL_TABLET | ORAL | Status: DC
  Administered 2019-03-24: 23:00:00 2 via ORAL
  Filled 2019-03-24: qty 2

## 2019-03-24 MED ORDER — ZOLPIDEM TARTRATE 5 MG PO TABS: 5.0000 mg | ORAL_TABLET | Freq: Every evening | ORAL | Status: DC | PRN

## 2019-03-24 MED ORDER — ONDANSETRON HCL 4 MG/2ML IJ SOLN: 4.0000 mg | INTRAMUSCULAR | Status: DC | PRN

## 2019-03-24 MED ORDER — BENZOCAINE-MENTHOL 20-0.5 % EX AERO: 1.0000 "application " | INHALATION_SPRAY | CUTANEOUS | Status: DC | PRN

## 2019-03-24 MED ORDER — ONDANSETRON HCL 4 MG PO TABS: 4.0000 mg | ORAL_TABLET | ORAL | Status: DC | PRN

## 2019-03-24 MED ORDER — LACTATED RINGERS AMNIOINFUSION: INTRAVENOUS | Status: DC

## 2019-03-24 MED ORDER — WITCH HAZEL-GLYCERIN EX PADS: 1.0000 "application " | MEDICATED_PAD | CUTANEOUS | Status: DC | PRN

## 2019-03-24 MED ORDER — DIPHENHYDRAMINE HCL 25 MG PO CAPS: 25.0000 mg | ORAL_CAPSULE | Freq: Four times a day (QID) | ORAL | Status: DC | PRN

## 2019-03-24 MED ORDER — OXYCODONE HCL 5 MG PO TABS
5.0000 mg | ORAL_TABLET | ORAL | Status: DC | PRN
  Administered 2019-03-24: 12:00:00 5 mg via ORAL
  Filled 2019-03-24: qty 1

## 2019-03-24 MED ORDER — TETANUS-DIPHTH-ACELL PERTUSSIS 5-2.5-18.5 LF-MCG/0.5 IM SUSP: 0.5000 mL | Freq: Once | INTRAMUSCULAR | Status: DC

## 2019-03-24 MED ORDER — IBUPROFEN 600 MG PO TABS
600.0000 mg | ORAL_TABLET | Freq: Four times a day (QID) | ORAL | Status: DC
  Administered 2019-03-24 – 2019-03-25 (×5): 600 mg via ORAL
  Filled 2019-03-24 (×5): qty 1

## 2019-03-24 MED ORDER — OXYCODONE HCL 5 MG PO TABS: 10.0000 mg | ORAL_TABLET | ORAL | Status: DC | PRN

## 2019-03-24 NOTE — Discharge Summary (Signed)
Postpartum Discharge Summary   Patient Name: Katrina Stewart DOB: 03-Apr-1995 MRN: [id number]  Date of admission: 03/23/2019 Delivering Provider: Merilyn Baba   Date of discharge: 03/25/2019  Admitting diagnosis: Encounter for induction of labor [Z34.90] Intrauterine pregnancy: [redacted]w[redacted]d    Secondary diagnosis:  Active Problems:   Rh negative state in antepartum period   Rubella non-immune status, antepartum   History of depression   Maternal obesity affecting pregnancy, antepartum   Chronic hypertension complicating or reason for care during pregnancy, second trimester   Gestational diabetes mellitus (GDM), antepartum   Group B streptococcal infection in pregnancy   Encounter for induction of labor   [redacted] weeks gestation of pregnancy  Additional problems: None     Discharge diagnosis: Term Pregnancy Delivered, CHTN and GDM A1                                                                                                Post partum procedures:None  Augmentation: AROM, Pitocin, Cytotec and Foley Balloon  Complications: None  Hospital course:  Induction of Labor With Vaginal Delivery   24y.o. yo G3P1011 at 378w1das admitted to the hospital 03/23/2019 for induction of labor.  Indication for induction: cHTN and A1GDM.  Patient had an uncomplicated labor course as follows:  Patient was induced with FB and cytotec. Pitocin was started and she was eventually AROMed. She progressed to complete and delivered shortly after.  Membrane Rupture Time/Date: 4:31 PM ,03/23/2019   Intrapartum Procedures: Episiotomy: None [1]                                         Lacerations:  1st degree [2]  Patient had delivery of a Viable infant.  Information for the patient's newborn:  SiSherrelle, Prochazka0[[id number]]Delivery Method: Vag-Spont    03/24/2019  Details of delivery can be found in separate delivery note.  Patient had a routine postpartum course. Patient is discharged home  03/25/19. Delivery time: 4:08 AM    Magnesium Sulfate received: No BMZ received: No Rhophylac:No MMR:N/A Transfusion:No  Physical exam  Vitals:   03/24/19 1131 03/24/19 1506 03/24/19 1922 03/25/19 0541  BP: 122/66 123/80 123/76 109/85  Pulse: 88 92 86 84  Resp: 18 16 19 18   Temp: 98.1 F (36.7 C) 98 F (36.7 C) 98.6 F (37 C) 98.5 F (36.9 C)  TempSrc: Oral Oral Tympanic Oral  SpO2: 99% 99% 100% 98%  Weight:      Height:       General: alert, cooperative and no distress Lochia: appropriate Uterine Fundus: firm Incision: N/A DVT Evaluation: No evidence of DVT seen on physical exam. No cords or calf tenderness. Labs: Lab Results  Component Value Date   WBC 9.5 03/24/2019   HGB 10.6 (L) 03/24/2019   HCT 34.1 (L) 03/24/2019   MCV 70.9 (L) 03/24/2019   PLT 299 03/24/2019   CMP Latest Ref Rng & Units 03/23/2019  Glucose 70 - 99 mg/dL 80  BUN 6 -  20 mg/dL 8  Creatinine 0.44 - 1.00 mg/dL 0.59  Sodium 135 - 145 mmol/L 138  Potassium 3.5 - 5.1 mmol/L 3.6  Chloride 98 - 111 mmol/L 109  CO2 22 - 32 mmol/L 18(L)  Calcium 8.9 - 10.3 mg/dL 9.3  Total Protein 6.5 - 8.1 g/dL 6.7  Total Bilirubin 0.3 - 1.2 mg/dL 0.5  Alkaline Phos 38 - 126 U/L 96  AST 15 - 41 U/L 14(L)  ALT 0 - 44 U/L 11   Edinburgh Score: Edinburgh Postnatal Depression Scale Screening Tool 03/25/2019  I have been able to laugh and see the funny side of things. 0  I have looked forward with enjoyment to things. 1  I have blamed myself unnecessarily when things went wrong. 2  I have been anxious or worried for no good reason. 2  I have felt scared or panicky for no good reason. 1  Things have been getting on top of me. 2  I have been so unhappy that I have had difficulty sleeping. 0  I have felt sad or miserable. 2  I have been so unhappy that I have been crying. 1  The thought of harming myself has occurred to me. 0  Edinburgh Postnatal Depression Scale Total 11    Discharge instruction: per After  Visit Summary and "Baby and Me Booklet".  After visit meds:  Allergies as of 03/25/2019   No Known Allergies     Medication List    TAKE these medications   Accu-Chek Guide test strip Generic drug: glucose blood For use 4 times a day   Accu-Chek Guide w/Device Kit 1 Device by Does not apply route daily. Use as directed   Accu-Chek Softclix Lancets lancets 4 times daily   acetaminophen 500 MG tablet Commonly known as: TYLENOL Take 1,000 mg by mouth every 6 (six) hours as needed for moderate pain.   aspirin EC 81 MG tablet Take 1 tablet (81 mg total) by mouth daily.   Blood Pressure Kit Devi 1 Device by Does not apply route as needed. ICD 10: Z39.90   Chewey 1 Device by Does not apply route daily.   ferrous sulfate 325 (65 FE) MG tablet Take 1 tablet (325 mg total) by mouth daily.   norethindrone 0.35 MG tablet Commonly known as: Errin Take 1 tablet (0.35 mg total) by mouth daily.   ondansetron 4 MG disintegrating tablet Commonly known as: ZOFRAN-ODT Take 1 tablet (4 mg total) by mouth every 8 (eight) hours as needed for nausea or vomiting.   prenatal vitamin w/FE, FA 27-1 MG Tabs tablet Take 1 tablet by mouth daily at 12 noon.   sertraline 50 MG tablet Commonly known as: Zoloft Take 2 tablets (100 mg total) by mouth daily.       Diet: routine diet  Activity: Advance as tolerated. Pelvic rest for 6 weeks.   Outpatient follow up: 4 weeks Follow up Appt:No future appointments. Follow up Visit: Please schedule this patient for Postpartum visit in: 4 weeks with the following provider: MD For C/S patients schedule nurse incision check in weeks 2 weeks: no High risk pregnancy complicated by: [id number], cHTN Delivery mode:  SVD Anticipated Birth Control:  other/unsure PP Procedures needed: 2 hour GTT  Schedule Integrated BH visit: no  Newborn Data: Live born female  Birth Weight: 2815g APGAR: 49, 9  Newborn Delivery   Birth  date/time: 03/24/2019 04:08:00 Delivery type: Vaginal, Spontaneous      Baby Feeding: Bottle  and Breast Disposition: home with mother   03/25/2019 Daisy Floro, DO

## 2019-03-24 NOTE — Discharge Instructions (Signed)

## 2019-03-24 NOTE — Anesthesia Postprocedure Evaluation (Signed)
Anesthesia Post Note  Patient: Katrina Stewart  Procedure(s) Performed: AN AD HOC LABOR EPIDURAL     Patient location during evaluation: Mother Baby Anesthesia Type: Epidural Level of consciousness: awake Pain management: satisfactory to patient Vital Signs Assessment: post-procedure vital signs reviewed and stable Respiratory status: spontaneous breathing Cardiovascular status: stable Anesthetic complications: no    Last Vitals:  Vitals:   03/24/19 0721 03/24/19 1131  BP: 126/63 122/66  Pulse: (!) 101 88  Resp: 20 18  Temp: 36.8 C 36.7 C  SpO2: 100% 99%    Last Pain:  Vitals:   03/24/19 1132  TempSrc:   PainSc: 7    Pain Goal:                   [employer]

## 2019-03-24 NOTE — Lactation Note (Signed)
This note was copied from a baby's chart. Lactation Consultation Note  Patient Name: Boy Neysha Alias [id number] Date: 03/24/2019 Reason for consult: Initial assessment;Term  1800 - 1829 - I conducted an initial breast feeding consult with Ms. Asper. Her plan is to breast feed, but she states that she is bottle feeding at this time because she's unsure if she has any milk. I educated on the differences between colostrum and mature milk and encouraged her to breast feed for the benefits of colostrum.  Ms. Crystal knows how to HE, and she has provided some EBM to baby via spoon.   I set up a DEBP and showed her how to use and clean her equipment. I recommended pumping 8 times a day for 15 minutes. I encouraged her to offer her son the breast and to supplement, if needed following.   I recommended feeding baby 8-12 times a day on demand. I wrote my name on the board and encouraged her to call when ready for latch assistance. Ms. Lafosse was eating dinner at the time of this visit and baby had a bottle about an hour prior to entry.  All questions answered at this time.   Feeding Feeding Type: Bottle Fed - Formula Nipple Type: Slow - flow  LATCH Score Latch: Repeated attempts needed to sustain latch, nipple held in mouth throughout feeding, stimulation needed to elicit sucking reflex.  Audible Swallowing: A few with stimulation  Type of Nipple: Everted at rest and after stimulation  Comfort (Breast/Nipple): Soft / non-tender  Hold (Positioning): Assistance needed to correctly position infant at breast and maintain latch.  LATCH Score: 7  Interventions Interventions: Breast feeding basics reviewed;DEBP  Lactation Tools Discussed/Used Tools: Pump(spoon; syringe) Breast pump type: Double-Electric Breast Pump Pump Review: Setup, frequency, and cleaning Initiated by:: hl Date initiated:: 03/24/19   Consult Status Consult Status: Follow-up Date: 03/25/19 Follow-up type:  In-patient    Lenore Manner 03/24/2019, 8:09 PM

## 2019-03-24 NOTE — Lactation Note (Signed)
This note was copied from a baby's chart. Lactation Consultation Note  Patient Name: Katrina Stewart [id number] Date: 03/24/2019  Newborn is 39 hours old and mostly formula feeding. Visit made but mom sleeping.  Will follow up later.   Maternal Data    Feeding Feeding Type: Bottle Fed - Formula Nipple Type: Slow - flow  LATCH Score                   Interventions    Lactation Tools Discussed/Used     Consult Status      Ave [employer] 03/24/2019, 1:28 PM

## 2019-03-24 NOTE — Progress Notes (Signed)
Marlenne Holway is a 24 y.o. G3P1011 at [redacted]w[redacted]d admitted for IOL 2/2 cHTN and A1GDM  Subjective: Called by RN because IUPC came out. Patient feeling more nauseated and more pressure in her bottom.  Objective: BP 113/71   Pulse (!) 103   Temp 98.5 F (36.9 C) (Oral)   Resp 18   Ht 5\' 9"  (1.753 m)   Wt 125.2 kg   LMP 06/23/2018 (Exact Date)   SpO2 99%   BMI 40.76 kg/m  Total I/O In: -  Out: 50 [Urine:925]  FHT:  FHR: 145 bpm, variability: moderate,  accelerations:  Present,  decelerations:  Present variables v early decels UC:   regular, every 3-4 minutes  SVE:   Dilation: 4.5 Effacement (%): 60 Station: -2 Exam by:: Dr. Darene Lamer  Pitocin @ 8 mu/min  Labs: Lab Results  Component Value Date   WBC 11.6 (H) 03/23/2019   HGB 10.6 (L) 03/23/2019   HCT 33.5 (L) 03/23/2019   MCV 70.2 (L) 03/23/2019   PLT 326 03/23/2019    Assessment / Plan: 33 [id number] [redacted]w[redacted]d here for IOL for cHTN and GDMA1.  #Labor: S/p Cytotec x2, Foley bulb, AROM. Pitocin at 4. IUPC replaced at this check, making slow cervical change- not yet in active labor.Anticipate vaginal delivery. #Pain: comfortable with epidural #FWB: Cat I #GBS positive; PCN #GDMA1: last glucose 75 #cHTN: Labs WNL. BP's currently normal range. Asymptomatic.  Merilyn Baba DO OB Fellow, Faculty Practice 03/24/2019, 12:06 AM

## 2019-03-25 ENCOUNTER — Encounter (HOSPITAL_COMMUNITY): Payer: Self-pay | Admitting: Obstetrics and Gynecology

## 2019-03-25 MED ORDER — NORETHINDRONE 0.35 MG PO TABS: 1.0000 | ORAL_TABLET | Freq: Every day | ORAL | 11 refills | Status: DC

## 2019-03-25 NOTE — Lactation Note (Signed)
This note was copied from a baby's chart. Lactation Consultation Note  Patient Name: Boy Lasaundra Blust [id number] Date: 03/25/2019 Reason for consult: Follow-up assessment;Term  1344 - 1413 - LC followed up with Ms. Swaby to check on progress and to do discharge education. She reports that she has not pumped since I set it up for her yesterday. She attempted to breast feed overnight, and was able to latch baby Adonis with the assistance of her RN. She used a nipple shield.  Ms. Maclachlan states that she has chosen to pump and bottle feed baby. She has a used Training and development officer pump at home. I encouraged her to take her pump equipment home with her to have a second set.  I educated on the importance of early and frequent stimulation and milk removal to breast milk production. Ms. Brailsford verbalized understanding.   Baby was cueing while in the room, and Ms. Mende stated that it was time to feed. I offered to assist and she chose to breast feed. I first changed a large meconium diaper, and then we attempted to latch to the right breast in football hold. Ms. Tahara shows good positioning.  Baby ultimately latched with the use a size 24 NS. He latched well to it with rhythmic suckling sequences. Occasionally I injected some formula into the shield to encourage him to keep going. I encouraged Ms. Kirshner to allow him to breast feed for 20-30 minute and then follow with a bottle. I educated on how to remove the nipple shield in the future to try to latch without it.  The plan I recommended is to pump 8 times a day for 15-20 minutes. Breast feed (upon preference) using a 24 NS and then supplement with her EBM or formula. Pumping emphasized to help protect her breast milk.  Discharge education conducted and community breast feeding resources reviewed. All questions answered at this time.   Maternal Data Formula Feeding for Exclusion: Yes Reason for exclusion: Mother's choice to formula and breast feed on  admission Has patient been taught Hand Expression?: Yes  Feeding Feeding Type: Breast Milk with Formula added Nipple Type: Slow - flow  LATCH Score Latch: Grasps breast easily, tongue down, lips flanged, rhythmical sucking.  Audible Swallowing: A few with stimulation  Type of Nipple: Everted at rest and after stimulation  Comfort (Breast/Nipple): Soft / non-tender  Hold (Positioning): Assistance needed to correctly position infant at breast and maintain latch.  LATCH Score: 8  Interventions Interventions: Breast feeding basics reviewed;Assisted with latch;Skin to skin;Hand express;Adjust position;Support pillows(nipple shield; formula with curved tip syringe inside shield)  Lactation Tools Discussed/Used Tools: Nipple Shields Nipple shield size: 24 Breast pump type: Double-Electric Breast Pump WIC Program: Yes Pump Review: Setup, frequency, and cleaning   Consult Status Consult Status: Complete    Lenore Manner 03/25/2019, 2:20 PM

## 2019-03-25 NOTE — Progress Notes (Signed)
CSW received consult for hx of Anxiety and Depression,  and score 11 on Edinburgh Depression Screen. CSW met with MOB to offer support and complete assessment.    CSW met with MOB at bedside. Infant, Adonnis, was present and being held by [facility] at time of visit. CSW discussed mental health history with MOB.  MOB reported history of depression, anxiety, and hx of  postpartum depression and anxiety. When asked to discribe current mood, MOB stated "I'm OK". MOB denied any current concerns with SI, HI, or domestic violence. MOB reports she is currently taking active rx for Zoloft(181m)  and attends weekly BH therapy sessions. MOB reports therapy has been helpful however medicines not as much. MOB idnetified her mother as support system.   CSW provided education regarding the baby blues period vs. perinatal mood disorders, discussed treatment and gave resources for mental health follow up if concerns arise.  CSW recommends self-evaluation during the postpartum time period using the New Mom Checklist from Postpartum Progress and encouraged MOB to contact a medical professional if symptoms are noted at any time.    CSW provided review of Sudden Infant Death Syndrome (SIDS) precautions. MOB reported having all needed items for baby including car seat and bassinet for safe sleeping area.   CSW identifies no further need for intervention and no barriers to discharge at this time.  Wise Fees D. DLissa Morales MSW, LShannon West Texas Memorial HospitalClinical Social Worker [phone]

## 2019-03-26 ENCOUNTER — Encounter: Payer: Medicaid Other | Admitting: Family Medicine

## 2019-03-26 ENCOUNTER — Other Ambulatory Visit: Payer: Medicaid Other

## 2019-03-30 ENCOUNTER — Encounter (HOSPITAL_COMMUNITY): Payer: Self-pay | Admitting: Obstetrics and Gynecology

## 2019-03-30 ENCOUNTER — Inpatient Hospital Stay (HOSPITAL_COMMUNITY)
Admission: AD | Admit: 2019-03-30 | Discharge: 2019-03-30 | Disposition: A | Discharge status: Home / Self Care | Payer: Medicaid Other | Attending: Obstetrics and Gynecology | Admitting: Obstetrics and Gynecology

## 2019-03-30 DIAGNOSIS — R102 Pelvic and perineal pain: Secondary | ICD-10-CM | POA: Insufficient documentation

## 2019-03-30 DIAGNOSIS — O9089 Other complications of the puerperium, not elsewhere classified: Secondary | ICD-10-CM

## 2019-03-30 DIAGNOSIS — O99893 Other specified diseases and conditions complicating puerperium: Secondary | ICD-10-CM | POA: Insufficient documentation

## 2019-03-30 DIAGNOSIS — L03317 Cellulitis of buttock: Secondary | ICD-10-CM | POA: Diagnosis not present

## 2019-03-30 DIAGNOSIS — K649 Unspecified hemorrhoids: Secondary | ICD-10-CM

## 2019-03-30 DIAGNOSIS — O872 Hemorrhoids in the puerperium: Secondary | ICD-10-CM | POA: Diagnosis not present

## 2019-03-30 MED ORDER — HYDROCORTISONE (PERIANAL) 2.5 % EX CREA: TOPICAL_CREAM | Freq: Three times a day (TID) | CUTANEOUS | 0 refills | Status: DC

## 2019-03-30 MED ORDER — POLYETHYLENE GLYCOL 3350 17 GM/SCOOP PO POWD: 17.0000 g | Freq: Every day | ORAL | 0 refills | Status: DC

## 2019-03-30 MED ORDER — LIDOCAINE HCL (PF) 1 % IJ SOLN
30.0000 mL | Freq: Once | INTRAMUSCULAR | Status: DC
  Filled 2019-03-30: qty 30

## 2019-03-30 MED ORDER — KETOROLAC TROMETHAMINE 10 MG PO TABS: 10.0000 mg | ORAL_TABLET | Freq: Four times a day (QID) | ORAL | 0 refills | Status: DC | PRN

## 2019-03-30 MED ORDER — KETOROLAC TROMETHAMINE 30 MG/ML IJ SOLN
30.0000 mg | Freq: Once | INTRAMUSCULAR | Status: AC
  Administered 2019-03-30: 30 mg via INTRAMUSCULAR
  Filled 2019-03-30: qty 1

## 2019-03-30 MED ORDER — CEPHALEXIN 500 MG PO CAPS: 500.0000 mg | ORAL_CAPSULE | Freq: Two times a day (BID) | ORAL | 0 refills | Status: AC

## 2019-03-30 NOTE — MAU Note (Signed)
Pt reporting a hard sore spot on left buttock. Also reporting vaginal pain. This all started yesterday. Denies any fever. Is having normal lochia.

## 2019-03-30 NOTE — MAU Provider Note (Signed)
History     CSN: [id number]  Arrival date and time: 03/30/19 1726   First Provider Initiated Contact with Patient 03/30/19 1825      Chief Complaint  Patient presents with  . Vaginal Pain   24 y.o. [id number] 6 days s/p SVD presenting with firm painful area to left buttock. First noticed it yesterday. Denies drainage. No fever. She tried Tylenol which helped some. Rates pain 9/10. She is also having pain from hemorrhoids. She has tried Tucks pads without much relief.   OB History    Gravida  3   Para  2   Term  2   Preterm      AB  1   Living  2     SAB  1   TAB  0   Ectopic      Multiple  0   Live Births  2           Past Medical History:  Diagnosis Date  . Anxiety   . COVID-19 virus detected   . Depression   . Vaginal Pap smear, abnormal     Past Surgical History:  Procedure Laterality Date  . BREAST SURGERY  2013   Lt & Rt excision juvenile fibroadenoma  . MASS EXCISION  10/29/2011   Procedure: EXCISION MASS;  Surgeon: Harl Bowie, MD;  Location: [facility];  Service: General;  Laterality: Bilateral;  excision of bilateral breast masses    Family History  Problem Relation Age of Onset  . Cancer Maternal Grandmother        lung cancer  . Cancer Other        bladder cancer  . Asthma Brother     Social History   Tobacco Use  . Smoking status: Never Smoker  . Smokeless tobacco: Never Used  Substance Use Topics  . Alcohol use: Not Currently    Alcohol/week: 2.0 - 3.0 standard drinks    Types: 2 - 3 Shots of liquor per week    Comment: every weekend before pregnancy (4-5 drinks)  . Drug use: No    Allergies: No Known Allergies  Medications Prior to Admission  Medication Sig Dispense Refill Last Dose  . acetaminophen (TYLENOL) 500 MG tablet Take 1,000 mg by mouth every 6 (six) hours as needed for moderate pain.   03/30/2019 at Unknown time  . aspirin EC 81 MG tablet Take 1 tablet (81 mg total) by mouth daily. 90 tablet 1 03/30/2019 at  Unknown time  . prenatal vitamin w/FE, FA (PRENATAL 1 + 1) 27-1 MG TABS tablet Take 1 tablet by mouth daily at 12 noon. 30 tablet 11 03/30/2019 at Unknown time  . sertraline (ZOLOFT) 50 MG tablet Take 2 tablets (100 mg total) by mouth daily. 60 tablet 3 03/29/2019 at Unknown time  . Accu-Chek Softclix Lancets lancets 4 times daily 100 each 12   . Blood Glucose Monitoring Suppl (ACCU-CHEK GUIDE) w/Device KIT 1 Device by Does not apply route daily. Use as directed 1 kit 0   . Blood Pressure Monitoring (BLOOD PRESSURE KIT) DEVI 1 Device by Does not apply route as needed. ICD 10: Z39.90 1 Device 0   . Elastic Bandages & Supports (COMFORT FIT MATERNITY SUPP MED) MISC 1 Device by Does not apply route daily. 1 each 0   . ferrous sulfate 325 (65 FE) MG tablet Take 1 tablet (325 mg total) by mouth daily. 30 tablet 3   . glucose blood (ACCU-CHEK GUIDE) test strip For use 4  times a day 100 each 12   . norethindrone (ERRIN) 0.35 MG tablet Take 1 tablet (0.35 mg total) by mouth daily. 1 Package 11   . ondansetron (ZOFRAN-ODT) 4 MG disintegrating tablet Take 1 tablet (4 mg total) by mouth every 8 (eight) hours as needed for nausea or vomiting. 60 tablet 2     Review of Systems Physical Exam   Blood pressure (!) 109/50, pulse 94, temperature 98.7 F (37.1 C), temperature source Oral, resp. rate 18, height 5' 9"  (1.753 m), weight 118.4 kg, SpO2 99 %, unknown if currently breastfeeding.  Physical Exam  Nursing note and vitals reviewed. Constitutional: She is oriented to person, place, and time. She appears well-developed and well-nourished. No distress.  HENT:  Head: Normocephalic and atraumatic.  Cardiovascular: Normal rate.  Respiratory: Effort normal. No respiratory distress.  Genitourinary: Rectum:     External hemorrhoid (large cluster) present.     Genitourinary Comments:     Musculoskeletal:        General: Normal range of motion.     Cervical back: Normal range of motion.  Neurological: She is  alert and oriented to person, place, and time.  Skin: Skin is warm and dry.  Psychiatric: She has a normal mood and affect.   Media Information   Document Information  Photos  Left buttock   03/30/2019 18:44  Attached To:  Hospital Encounter on 03/30/19  Source Information  Julianne Handler, CNM  [facility] Maternity Assess   MAU Course  Procedures Aspiration of left buttock I&D of hemorrhoid  MDM Consult with Dr. Glo Herring, MD to bedside for eval and treatment. Plan for abx and hemorrhoid treatment. Stable for discharge home.   Assessment and Plan   1. Cellulitis of buttock   2. Hemorrhoids, unspecified hemorrhoid type   3. Postpartum state    Discharge home Follow up in 1 week as [place] Return precautions  Allergies as of 03/30/2019   No Known Allergies     Medication List    STOP taking these medications   Accu-Chek Guide test strip Generic drug: glucose blood   Accu-Chek Guide w/Device Kit   Accu-Chek Softclix Lancets lancets   acetaminophen 500 MG tablet Commonly known as: TYLENOL   Blood Pressure Kit Chemical engineer Maternity Supp Med Misc   ferrous sulfate 325 (65 FE) MG tablet   ondansetron 4 MG disintegrating tablet Commonly known as: ZOFRAN-ODT     TAKE these medications   aspirin EC 81 MG tablet Take 1 tablet (81 mg total) by mouth daily.   cephALEXin 500 MG capsule Commonly known as: KEFLEX Take 1 capsule (500 mg total) by mouth 2 (two) times daily for 7 days.   hydrocortisone 2.5 % rectal cream Commonly known as: ANUSOL-HC Place rectally 3 (three) times daily.   ketorolac 10 MG tablet Commonly known as: TORADOL Take 1 tablet (10 mg total) by mouth every 6 (six) hours as needed for moderate pain or severe pain.   norethindrone 0.35 MG tablet Commonly known as: Errin Take 1 tablet (0.35 mg total) by mouth daily.   polyethylene glycol powder 17 GM/SCOOP powder Commonly known as: GLYCOLAX/MIRALAX Take 17 g by mouth daily.     prenatal vitamin w/FE, FA 27-1 MG Tabs tablet Take 1 tablet by mouth daily at 12 noon.   sertraline 50 MG tablet Commonly known as: Zoloft Take 2 tablets (100 mg total) by mouth daily.      Julianne Handler, CNM 03/30/2019, 8:23 PM

## 2019-03-30 NOTE — Discharge Instructions (Signed)
Hemorrhoids Hemorrhoids are swollen veins that may develop:  In the butt (rectum). These are called internal hemorrhoids.  Around the opening of the butt (anus). These are called external hemorrhoids. Hemorrhoids can cause pain, itching, or bleeding. Most of the time, they do not cause serious problems. They usually get better with diet changes, lifestyle changes, and other home treatments. What are the causes? This condition may be caused by:  Having trouble pooping (constipation).  Pushing hard (straining) to poop.  Watery poop (diarrhea).  Pregnancy.  Being very overweight (obese).  Sitting for long periods of time.  Heavy lifting or other activity that causes you to strain.  Anal sex.  Riding a bike for a long period of time. What are the signs or symptoms? Symptoms of this condition include:  Pain.  Itching or soreness in the butt.  Bleeding from the butt.  Leaking poop.  Swelling in the area.  One or more lumps around the opening of your butt. How is this diagnosed? A doctor can often diagnose this condition by looking at the affected area. The doctor may also:  Do an exam that involves feeling the area with a gloved hand (digital rectal exam).  Examine the area inside your butt using a small tube (anoscope).  Order blood tests. This may be done if you have lost a lot of blood.  Have you get a test that involves looking inside the colon using a flexible tube with a camera on the end (sigmoidoscopy or colonoscopy). How is this treated? This condition can usually be treated at home. Your doctor may tell you to change what you eat, make lifestyle changes, or try home treatments. If these do not help, procedures can be done to remove the hemorrhoids or make them smaller. These may involve:  Placing rubber bands at the base of the hemorrhoids to cut off their blood supply.  Injecting medicine into the hemorrhoids to shrink them.  Shining a type of light  energy onto the hemorrhoids to cause them to fall off.  Doing surgery to remove the hemorrhoids or cut off their blood supply. Follow these instructions at home: Eating and drinking   Eat foods that have a lot of fiber in them. These include whole grains, beans, nuts, fruits, and vegetables.  Ask your doctor about taking products that have added fiber (fibersupplements).  Reduce the amount of fat in your diet. You can do this by: ? Eating low-fat dairy products. ? Eating less red meat. ? Avoiding processed foods.  Drink enough fluid to keep your pee (urine) pale yellow. Managing pain and swelling   Take a warm-water bath (sitz bath) for 20 minutes to ease pain. Do this 3-4 times a day. You may do this in a bathtub or using a portable sitz bath that fits over the toilet.  If told, put ice on the painful area. It may be helpful to use ice between your warm baths. ? Put ice in a plastic bag. ? Place a towel between your skin and the bag. ? Leave the ice on for 20 minutes, 2-3 times a day. General instructions  Take over-the-counter and prescription medicines only as told by your doctor. ? Medicated creams and medicines may be used as told.  Exercise often. Ask your doctor how much and what kind of exercise is best for you.  Go to the bathroom when you have the urge to poop. Do not wait.  Avoid pushing too hard when you poop.  Keep your   butt dry and clean. Use wet toilet paper or moist towelettes after pooping.  Do not sit on the toilet for a long time.  Keep all follow-up visits as told by your doctor. This is important. Contact a doctor if you:  Have pain and swelling that do not get better with treatment or medicine.  Have trouble pooping.  Cannot poop.  Have pain or swelling outside the area of the hemorrhoids. Get help right away if you have:  Bleeding that will not stop. Summary  Hemorrhoids are swollen veins in the butt or around the opening of the  butt.  They can cause pain, itching, or bleeding.  Eat foods that have a lot of fiber in them. These include whole grains, beans, nuts, fruits, and vegetables.  Take a warm-water bath (sitz bath) for 20 minutes to ease pain. Do this 3-4 times a day. This information is not intended to replace advice given to you by your health care provider. Make sure you discuss any questions you have with your health care provider. Document Revised: 01/26/2018 Document Reviewed: 06/09/2017 Elsevier Patient Education  2020 Elsevier Inc.  

## 2019-03-30 NOTE — MAU Note (Signed)
Verbal order obtained with Dr Glo Herring prior to procedures. Written consent obtained.

## 2019-04-02 ENCOUNTER — Telehealth: Payer: Self-pay | Admitting: Family Medicine

## 2019-04-02 NOTE — Telephone Encounter (Signed)
Attempted to call patient to get her scheduled for an appointment. Left a message for her to call us back.

## 2019-04-04 ENCOUNTER — Telehealth: Payer: Self-pay | Admitting: Obstetrics and Gynecology

## 2019-04-04 NOTE — Telephone Encounter (Signed)
Phone call to patient, who reports hemorrhoids are improved, and believes the edematous area is improving, but draining fluid. Pt will continue antibiotics.

## 2019-04-09 ENCOUNTER — Ambulatory Visit: Payer: Medicaid Other | Admitting: Family Medicine

## 2019-04-11 ENCOUNTER — Ambulatory Visit (INDEPENDENT_AMBULATORY_CARE_PROVIDER_SITE_OTHER): Payer: Medicaid Other | Admitting: Obstetrics & Gynecology

## 2019-04-11 ENCOUNTER — Other Ambulatory Visit: Payer: Self-pay

## 2019-04-11 ENCOUNTER — Encounter: Payer: Self-pay | Admitting: Obstetrics & Gynecology

## 2019-04-11 VITALS — BP 121/84 | HR 87 | Wt 254.4 lb

## 2019-04-11 DIAGNOSIS — F332 Major depressive disorder, recurrent severe without psychotic features: Secondary | ICD-10-CM

## 2019-04-11 DIAGNOSIS — K649 Unspecified hemorrhoids: Secondary | ICD-10-CM

## 2019-04-11 DIAGNOSIS — L03317 Cellulitis of buttock: Secondary | ICD-10-CM

## 2019-04-11 MED ORDER — SERTRALINE HCL 100 MG PO TABS: 150.0000 mg | ORAL_TABLET | Freq: Every day | ORAL | 3 refills | Status: DC

## 2019-04-11 NOTE — Progress Notes (Signed)
   POSTPARTUM VISIT NOTE  History:   Katrina Stewart is a 24 y.o. [id number] here today for follow up after aspiration of buttock lesion and lancing of thrombosed hemorrhoid on 03/30/19.  Had SVD on 03/24/19.  Reports feeling so much better, no further drainage.  Baby is doing well. Patient is concerned about her depression, feels Zoloft 100 mg daily is not working. No SI/HI.  She denies any abnormal vaginal discharge, bleeding, pelvic pain or other concerns.    Past Medical History:  Diagnosis Date  . Anxiety   . COVID-19 virus detected   . Depression   . Vaginal Pap smear, abnormal     Past Surgical History:  Procedure Laterality Date  . BREAST SURGERY  2013   Lt & Rt excision juvenile fibroadenoma  . MASS EXCISION  10/29/2011   Procedure: EXCISION MASS;  Surgeon: Harl Bowie, MD;  Location: [facility];  Service: General;  Laterality: Bilateral;  excision of bilateral breast masses    The following portions of the patient's history were reviewed and updated as appropriate: allergies, current medications, past family history, past medical history, past social history, past surgical history and problem list.   Review of Systems:  Pertinent items noted in HPI and remainder of comprehensive ROS otherwise negative.  Physical Exam:  BP 121/84   Pulse 87   Wt 254 lb 6.4 oz (115.4 kg)   Breastfeeding Yes   BMI 37.57 kg/m  CONSTITUTIONAL: Well-developed, well-nourished female in no acute distress.  NEUROLOGIC: Alert and oriented to person, place, and time. Normal muscle tone coordination. No cranial nerve deficit noted. PSYCHIATRIC: Normal mood and affect. Normal behavior. Normal judgment and thought content. CARDIOVASCULAR: Normal heart rate noted RESPIRATORY: Effort and breath sounds normal, no problems with respiration noted ABDOMEN: No masses noted. No other overt distention noted.   PELVIC: Healing left buttock site.  Hemorrhoids seen, no anomalies noted. No drainage or erythema  noted.       Assessment and Plan:      1. Hemorrhoids, unspecified hemorrhoid type 2. Cellulitis of buttock Healing well.  No other concerns.  2. Severe episode of recurrent major depressive disorder, without psychotic features ([place]) Increased dosage to 150 mg, she will see our counselor ASAP.  Precautions reviewed. - sertraline (ZOLOFT) 100 MG tablet; Take 1.5 tablets (150 mg total) by mouth daily.  Dispense: 60 tablet; Refill: 3 Routine preventative health maintenance measures emphasized. Please refer to After Visit Summary for other counseling recommendations.   Return in about 2 weeks (around 04/25/2019) for 2 hr GTT, Postpartum check.  Also needs appoinment with Seth Bake ASAP.    Total face-to-face time with patient: 15 minutes.  Over 50% of encounter was spent on counseling and coordination of care.   Verita Schneiders, MD, [place] for [employer], [place]

## 2019-04-11 NOTE — Patient Instructions (Signed)
Return to clinic for any scheduled appointments or for any gynecologic concerns as needed.   

## 2019-04-24 ENCOUNTER — Other Ambulatory Visit: Payer: Self-pay | Admitting: General Practice

## 2019-04-24 DIAGNOSIS — O24410 Gestational diabetes mellitus in pregnancy, diet controlled: Secondary | ICD-10-CM

## 2019-04-25 ENCOUNTER — Ambulatory Visit (INDEPENDENT_AMBULATORY_CARE_PROVIDER_SITE_OTHER): Payer: Medicaid Other | Admitting: Obstetrics and Gynecology

## 2019-04-25 ENCOUNTER — Other Ambulatory Visit: Payer: Self-pay

## 2019-04-25 ENCOUNTER — Encounter: Payer: Self-pay | Admitting: Obstetrics and Gynecology

## 2019-04-25 ENCOUNTER — Other Ambulatory Visit: Payer: Medicaid Other

## 2019-04-25 VITALS — BP 121/82 | HR 80 | Ht 69.0 in | Wt 260.3 lb

## 2019-04-25 DIAGNOSIS — Z8659 Personal history of other mental and behavioral disorders: Secondary | ICD-10-CM

## 2019-04-25 DIAGNOSIS — O1093 Unspecified pre-existing hypertension complicating the puerperium: Secondary | ICD-10-CM

## 2019-04-25 DIAGNOSIS — O24419 Gestational diabetes mellitus in pregnancy, unspecified control: Secondary | ICD-10-CM

## 2019-04-25 DIAGNOSIS — O10912 Unspecified pre-existing hypertension complicating pregnancy, second trimester: Secondary | ICD-10-CM

## 2019-04-25 DIAGNOSIS — O24439 Gestational diabetes mellitus in the puerperium, unspecified control: Secondary | ICD-10-CM

## 2019-04-25 DIAGNOSIS — O24410 Gestational diabetes mellitus in pregnancy, diet controlled: Secondary | ICD-10-CM

## 2019-04-25 NOTE — Patient Instructions (Signed)
Health Maintenance, Female Adopting a healthy lifestyle and getting preventive care are important in promoting health and wellness. Ask your health care provider about:  The right schedule for you to have regular tests and exams.  Things you can do on your own to prevent diseases and keep yourself healthy. What should I know about diet, weight, and exercise? Eat a healthy diet   Eat a diet that includes plenty of vegetables, fruits, low-fat dairy products, and lean protein.  Do not eat a lot of foods that are high in solid fats, added sugars, or sodium. Maintain a healthy weight Body mass index (BMI) is used to identify weight problems. It estimates body fat based on height and weight. Your health care provider can help determine your BMI and help you achieve or maintain a healthy weight. Get regular exercise Get regular exercise. This is one of the most important things you can do for your health. Most adults should:  Exercise for at least 150 minutes each week. The exercise should increase your heart rate and make you sweat (moderate-intensity exercise).  Do strengthening exercises at least twice a week. This is in addition to the moderate-intensity exercise.  Spend less time sitting. Even light physical activity can be beneficial. Watch cholesterol and blood lipids Have your blood tested for lipids and cholesterol at 26 years of age, then have this test every 5 years. Have your cholesterol levels checked more often if:  Your lipid or cholesterol levels are high.  You are older than 26 years of age.  You are at high risk for heart disease. What should I know about cancer screening? Depending on your health history and family history, you may need to have cancer screening at various ages. This may include screening for:  Breast cancer.  Cervical cancer.  Colorectal cancer.  Skin cancer.  Lung cancer. What should I know about heart disease, diabetes, and high blood  pressure? Blood pressure and heart disease  High blood pressure causes heart disease and increases the risk of stroke. This is more likely to develop in people who have high blood pressure readings, are of African descent, or are overweight.  Have your blood pressure checked: ? Every 3-5 years if you are 18-39 years of age. ? Every year if you are 40 years old or older. Diabetes Have regular diabetes screenings. This checks your fasting blood sugar level. Have the screening done:  Once every three years after age 40 if you are at a normal weight and have a low risk for diabetes.  More often and at a younger age if you are overweight or have a high risk for diabetes. What should I know about preventing infection? Hepatitis B If you have a higher risk for hepatitis B, you should be screened for this virus. Talk with your health care provider to find out if you are at risk for hepatitis B infection. Hepatitis C Testing is recommended for:  Everyone born from 1945 through 1965.  Anyone with known risk factors for hepatitis C. Sexually transmitted infections (STIs)  Get screened for STIs, including gonorrhea and chlamydia, if: ? You are sexually active and are younger than 26 years of age. ? You are older than 26 years of age and your health care provider tells you that you are at risk for this type of infection. ? Your sexual activity has changed since you were last screened, and you are at increased risk for chlamydia or gonorrhea. Ask your health care provider if   you are at risk.  Ask your health care provider about whether you are at high risk for HIV. Your health care provider may recommend a prescription medicine to help prevent HIV infection. If you choose to take medicine to prevent HIV, you should first get tested for HIV. You should then be tested every 3 months for as long as you are taking the medicine. Pregnancy  If you are about to stop having your period (premenopausal) and  you may become pregnant, seek counseling before you get pregnant.  Take 400 to 800 micrograms (mcg) of folic acid every day if you become pregnant.  Ask for birth control (contraception) if you want to prevent pregnancy. Osteoporosis and menopause Osteoporosis is a disease in which the bones lose minerals and strength with aging. This can result in bone fractures. If you are 65 years old or older, or if you are at risk for osteoporosis and fractures, ask your health care provider if you should:  Be screened for bone loss.  Take a calcium or vitamin D supplement to lower your risk of fractures.  Be given hormone replacement therapy (HRT) to treat symptoms of menopause. Follow these instructions at home: Lifestyle  Do not use any products that contain nicotine or tobacco, such as cigarettes, e-cigarettes, and chewing tobacco. If you need help quitting, ask your health care provider.  Do not use street drugs.  Do not share needles.  Ask your health care provider for help if you need support or information about quitting drugs. Alcohol use  Do not drink alcohol if: ? Your health care provider tells you not to drink. ? You are pregnant, may be pregnant, or are planning to become pregnant.  If you drink alcohol: ? Limit how much you use to 0-1 drink a day. ? Limit intake if you are breastfeeding.  Be aware of how much alcohol is in your drink. In the [place] one drink equals one 12 oz bottle of beer (355 mL), one 5 oz glass of wine (148 mL), or one 1 oz glass of hard liquor (44 mL). General instructions  Schedule regular health, dental, and eye exams.  Stay current with your vaccines.  Tell your health care provider if: ? You often feel depressed. ? You have ever been abused or do not feel safe at home. Summary  Adopting a healthy lifestyle and getting preventive care are important in promoting health and wellness.  Follow your health care provider's instructions about healthy  diet, exercising, and getting tested or screened for diseases.  Follow your health care provider's instructions on monitoring your cholesterol and blood pressure. This information is not intended to replace advice given to you by your health care provider. Make sure you discuss any questions you have with your health care provider. Document Revised: 01/11/2018 Document Reviewed: 01/11/2018 Elsevier Patient Education  2020 Elsevier Inc.  

## 2019-04-25 NOTE — Progress Notes (Signed)
Subjective:     Katrina Stewart is a 24 y.o. female who presents for a postpartum visit. She is 4 weeks postpartum following a spontaneous vaginal delivery. I have fully reviewed the prenatal and intrapartum course. The delivery was at 39/1 gestational weeks. Outcome: spontaneous vaginal delivery. Anesthesia: epidural. Postpartum course has been complicated by hemorrhoids and buttock cellulitis. . Baby's course has been unremarkable. Baby is feeding by both breast and bottle - [employer] Good Start. Bleeding no bleeding. Bowel function is abnormal: constipated. Bladder function is normal. Patient is not sexually active. Contraception method is OCP (estrogen/progesterone). Postpartum depression screening: positive.  The following portions of the patient's history were reviewed and updated as appropriate: allergies, current medications, past family history, past medical history, past social history, past surgical history and problem list.  Pregnancy complicated by [facility] without meds and GDM, diet control  Review of Systems Pertinent items noted in HPI and remainder of comprehensive ROS otherwise negative.   Objective:    There were no vitals taken for this visit.  General:  alert   Breasts:  not examined  Lungs: clear to auscultation bilaterally  Heart:  regular rate and rhythm, S1, S2 normal, no murmur, click, rub or gallop  Abdomen: soft, non-tender; bowel sounds normal; no masses,  no organomegaly   Vulva:  not evaluated  Vagina: not evaluated  Cervix:  not examined  Corpus: not examined  Adnexa:  not evaluated  Rectal Exam: Not performed.        Assessment:     Nl postpartum exam. Pap smear not done at today's visit.   Plan:    1. Contraception: oral progesterone-only contraceptive 2. Glucola today. Continue with Miralax as needed. Continue follow up and treatment for depression per BH/Psych. List of PCP's provided to pt. Return to nl ADL's as tolerates 3. Follow up in: 1 yr or as  needed.

## 2019-04-25 NOTE — Progress Notes (Signed)
Pt scored 11 on [place], she already has a therapist & on meds.

## 2019-04-27 LAB — GLUCOSE TOLERANCE, 2 HOURS
Glucose, 2 hour: 97 mg/dL (ref 65–139)
Glucose, GTT - Fasting: 92 mg/dL (ref 65–99)

## 2019-04-29 ENCOUNTER — Encounter: Payer: Self-pay | Admitting: Obstetrics & Gynecology

## 2019-10-03 ENCOUNTER — Encounter: Payer: Self-pay | Admitting: Internal Medicine

## 2019-10-03 ENCOUNTER — Telehealth (INDEPENDENT_AMBULATORY_CARE_PROVIDER_SITE_OTHER): Payer: Medicaid Other | Admitting: Internal Medicine

## 2019-10-03 DIAGNOSIS — F332 Major depressive disorder, recurrent severe without psychotic features: Secondary | ICD-10-CM

## 2019-10-03 DIAGNOSIS — O10919 Unspecified pre-existing hypertension complicating pregnancy, unspecified trimester: Secondary | ICD-10-CM

## 2019-10-03 DIAGNOSIS — Z8659 Personal history of other mental and behavioral disorders: Secondary | ICD-10-CM | POA: Diagnosis not present

## 2019-10-03 DIAGNOSIS — Z7689 Persons encountering health services in other specified circumstances: Secondary | ICD-10-CM | POA: Diagnosis not present

## 2019-10-03 DIAGNOSIS — Z8632 Personal history of gestational diabetes: Secondary | ICD-10-CM | POA: Diagnosis not present

## 2019-10-03 HISTORY — DX: Personal history of gestational diabetes: Z86.32

## 2019-10-03 NOTE — Progress Notes (Signed)
Virtual Visit via Telephone Note  I connected with Katrina Stewart, on 10/03/2019 at 1:33 PM by telephone due to the COVID-19 pandemic and verified that I am speaking with the correct person using two identifiers.   Consent: I discussed the limitations, risks, security and privacy concerns of performing an evaluation and management service by telephone and the availability of in person appointments. I also discussed with the patient that there may be a patient responsible charge related to this service. The patient expressed understanding and agreed to proceed.   Location of Patient: Home   Location of Provider: Clinic    Persons participating in Telemedicine visit: Catarina Flanagin Heide Guile Dr. Juleen China    History of Present Illness:  Patient has a visit to establish care. Has not been seen by PCP for a while. Was being managed by Ob-GYN during recent pregnancy. Delivered about 6 months ago. Reports history of cHTN not requiring medications during or outside of pregnancy.   Patient has a history of depression. Currently taking Zoloft 150 mg. Has been on this medication for about one year. Dose was not adjusted recently. Seeing therapist once per week. Doesn't feel like depression is significantly well controlled with the medication. Reports history of diagnosis of Bipolar when she was 12 but never had follow through. No current manic symptoms. Does report several months ago she was engaging in sexual activities that were out of character for her and engaging in other risky behaviors.   Depression screen [facility] 2/9 10/03/2019 03/19/2019 03/05/2019  Decreased Interest 2 3 2   Down, Depressed, Hopeless 2 3 3   PHQ - 2 Score 4 6 5   Altered sleeping 2 3 3   Tired, decreased energy 3 2 3   Change in appetite 3 2 2   Feeling bad or failure about yourself  0 3 1  Trouble concentrating 1 0 0  Moving slowly or fidgety/restless 0 0 0  Suicidal thoughts 0 0 0  PHQ-9 Score 13 16 14   Difficult  doing work/chores - - -  Some recent data might be hidden   GAD 7 : Generalized Anxiety Score 10/03/2019 03/19/2019 03/05/2019 02/05/2019  Nervous, Anxious, on Edge 1 2 3 2   Control/stop worrying 1 2 2 1   Worry too much - different things 0 1 2 1   Trouble relaxing 1 0 0 0  Restless 0 0 0 0  Easily annoyed or irritable 1 3 3 3   Afraid - awful might happen 0 1 1 0  Total GAD 7 Score 4 9 11 7      Past Medical History:  Diagnosis Date  . Anxiety   . COVID-19 virus infection 12/05/2018   + again on 2/4 (asymptomatic)  postiive test in MAU on 11-2  . Depression   . Essential hypertension   . Gestational diabetes mellitus (GDM)    Normal postpartum 2 hr GTT  . Group B streptococcal infection in pregnancy 03/13/2019  . Rh negative state in antepartum period 09/03/2015   [x]  needs rhogam at 28 weeks  [name] postive anti-D on initial lab. Patient received rhogam on 08/02/18 during MAU visit  . Rubella non-immune status, antepartum 09/03/2015  . Vaginal Pap smear, abnormal    No Known Allergies  Current Outpatient Medications on File Prior to Visit  Medication Sig Dispense Refill  . sertraline (ZOLOFT) 100 MG tablet Take 1.5 tablets (150 mg total) by mouth daily. 60 tablet 3   No current facility-administered medications on file prior to visit.    Observations/Objective: NAD. Speaking  clearly.  Work of breathing normal.  Alert and oriented. Mood appropriate.   Assessment and Plan: 1. Encounter to establish care Reviewed patient's PMH, social history, surgical history, and medications.  Is overdue for annual exam, screening blood work, and health maintenance topics. Have asked patient to return for visit to address these items.   2. Severe episode of recurrent major depressive disorder, without psychotic features ([place]) PHQ-9 is elevated. However, am concerned about adjusting SSRI dose and adding additional therapy given reported history of bipolar disorder. No safety concerns at present.  Continue with therapist and establish with psychiatrist for evaluation.  - Ambulatory referral to Psychiatry  3. History of bipolar disorder Patient reports history of bipolar disorder, never on medications. Also endorses behaviors concerning for mania. MDQ screen positive with score of 9 with events occurring at the same time and moderate impact on life. Referral to psychiatry for evaluation.  - Ambulatory referral to Psychiatry  4. Chronic hypertension during pregnancy History of cHTN not requiring medications. Will need close monitoring.   5. History of gestational diabetes Reports h/o A1GDM. Will need A1c to monitor.    Follow Up Instructions: Annual exam    I discussed the assessment and treatment plan with the patient. The patient was provided an opportunity to ask questions and all were answered. The patient agreed with the plan and demonstrated an understanding of the instructions.   The patient was advised to call back or seek an in-person evaluation if the symptoms worsen or if the condition fails to improve as anticipated.     I provided 32 minutes total of non-face-to-face time during this encounter including median intraservice time, reviewing previous notes, investigations, ordering medications, medical decision making, coordinating care and patient verbalized understanding at the end of the visit.    Phill Myron, D.O. Primary Care at [facility]  10/03/2019, 1:33 PM

## 2019-11-18 ENCOUNTER — Telehealth (HOSPITAL_COMMUNITY): Payer: Medicaid Other | Admitting: Adult Health

## 2019-11-30 ENCOUNTER — Other Ambulatory Visit: Payer: Self-pay

## 2019-11-30 ENCOUNTER — Telehealth (INDEPENDENT_AMBULATORY_CARE_PROVIDER_SITE_OTHER): Payer: Medicaid Other | Admitting: Physician Assistant

## 2019-11-30 DIAGNOSIS — F3160 Bipolar disorder, current episode mixed, unspecified: Secondary | ICD-10-CM | POA: Diagnosis not present

## 2019-12-04 MED ORDER — QUETIAPINE FUMARATE 50 MG PO TABS: 50.0000 mg | ORAL_TABLET | Freq: Every day | ORAL | 1 refills | Status: DC

## 2019-12-14 ENCOUNTER — Telehealth (HOSPITAL_COMMUNITY): Payer: Self-pay | Admitting: Physician Assistant

## 2019-12-19 ENCOUNTER — Other Ambulatory Visit (INDEPENDENT_AMBULATORY_CARE_PROVIDER_SITE_OTHER): Payer: Medicaid Other | Admitting: Physician Assistant

## 2019-12-19 DIAGNOSIS — F3160 Bipolar disorder, current episode mixed, unspecified: Secondary | ICD-10-CM

## 2019-12-19 DIAGNOSIS — G4700 Insomnia, unspecified: Secondary | ICD-10-CM

## 2019-12-19 MED ORDER — TRAZODONE HCL 50 MG PO TABS: 50.0000 mg | ORAL_TABLET | Freq: Every day | ORAL | 1 refills | Status: DC

## 2019-12-19 MED ORDER — ARIPIPRAZOLE 5 MG PO TABS: 5.0000 mg | ORAL_TABLET | Freq: Every day | ORAL | 1 refills | Status: DC

## 2019-12-19 NOTE — Progress Notes (Deleted)
[place] MD/PA/NP OP Progress Note  12/19/2019 8:37 PM Katrina Stewart  MRN:  [id number]  Chief Complaint:  HPI: *** Visit Diagnosis:    ICD-10-CM   1. Bipolar affective disorder, current episode mixed, current episode severity unspecified (HCC)  F31.60 ARIPiprazole (ABILIFY) 5 MG tablet  2. Insomnia, unspecified type  G47.00 traZODone (DESYREL) 50 MG tablet    Past Psychiatric History: ***  Past Medical History:  Past Medical History:  Diagnosis Date  . Anxiety   . COVID-19 virus infection 12/05/2018   + again on 2/4 (asymptomatic)  postiive test in MAU on 11-2  . Depression   . Depression    Phreesia 12/17/2019  . Essential hypertension   . Gestational diabetes mellitus (GDM)    Normal postpartum 2 hr GTT  . Group B streptococcal infection in pregnancy 03/13/2019  . Rh negative state in antepartum period 09/03/2015   [x]  needs rhogam at 28 weeks  [name] postive anti-D on initial lab. Patient received rhogam on 08/02/18 during MAU visit  . Rubella non-immune status, antepartum 09/03/2015  . Vaginal Pap smear, abnormal     Past Surgical History:  Procedure Laterality Date  . BREAST SURGERY  2013   Lt & Rt excision juvenile fibroadenoma  . MASS EXCISION  10/29/2011   Procedure: EXCISION MASS;  Surgeon: Harl Bowie, MD;  Location: [facility];  Service: General;  Laterality: Bilateral;  excision of bilateral breast masses    Family Psychiatric History: ***  Family History:  Family History  Problem Relation Age of Onset  . Cancer Maternal Grandmother        lung cancer  . Cancer Other        bladder cancer  . Asthma Brother     Social History:  Social History   Socioeconomic History  . Marital status: Single    Spouse name: Not on file  . Number of children: Not on file  . Years of education: Not on file  . Highest education level: Not on file  Occupational History    Comment: unemployed  Tobacco Use  . Smoking status: Never Smoker  . Smokeless tobacco: Never Used   Vaping Use  . Vaping Use: Never used  Substance and Sexual Activity  . Alcohol use: Not Currently    Alcohol/week: 2.0 - 3.0 standard drinks    Types: 2 - 3 Shots of liquor per week    Comment: every weekend before pregnancy (4-5 drinks)  . Drug use: No  . Sexual activity: Not Currently    Birth control/protection: None  Other Topics Concern  . Not on file  Social History Narrative  . Not on file   Social Determinants of Health   Financial Resource Strain:   . Difficulty of Paying Living Expenses: Not on file  Food Insecurity:   . Worried About Charity fundraiser in the Last Year: Not on file  . Ran Out of Food in the Last Year: Not on file  Transportation Needs:   . Lack of Transportation (Medical): Not on file  . Lack of Transportation (Non-Medical): Not on file  Physical Activity:   . Days of Exercise per Week: Not on file  . Minutes of Exercise per Session: Not on file  Stress:   . Feeling of Stress : Not on file  Social Connections:   . Frequency of Communication with Friends and Family: Not on file  . Frequency of Social Gatherings with Friends and Family: Not on file  . Attends Religious  Services: Not on file  . Active Member of Clubs or Organizations: Not on file  . Attends Archivist Meetings: Not on file  . Marital Status: Not on file    Allergies: No Known Allergies  Metabolic Disorder Labs: Lab Results  Component Value Date   HGBA1C 5.7 (H) 09/14/2018   No results found for: PROLACTIN Lab Results  Component Value Date   CHOL 153 07/18/2009   TRIG 78 07/18/2009   HDL 47 07/18/2009   CHOLHDL 3.3 Ratio 07/18/2009   VLDL 16 07/18/2009   LDLCALC 90 07/18/2009   Lab Results  Component Value Date   TSH 3.420 12/03/2014    Therapeutic Level Labs: No results found for: LITHIUM No results found for: VALPROATE No components found for:  CBMZ  Current Medications: Current Outpatient Medications  Medication Sig Dispense Refill  .  ARIPiprazole (ABILIFY) 5 MG tablet Take 1 tablet (5 mg total) by mouth daily. Take 1 tablet (5 mg) by mouth for one (1) week. If tolerating medication well, increase by 5 mg after one (1) week for a total of 10 mg by mouth daily. 30 tablet 1  . sertraline (ZOLOFT) 100 MG tablet Take 1.5 tablets (150 mg total) by mouth daily. 60 tablet 3  . traZODone (DESYREL) 50 MG tablet Take 1 tablet (50 mg total) by mouth at bedtime. 30 tablet 1   No current facility-administered medications for this visit.     Musculoskeletal: Strength & Muscle Tone: {desc; muscle tone:32375} Gait & Station: {PE GAIT ED YTKZ:60109} Patient leans: {Patient Leans:21022755}  Psychiatric Specialty Exam: Review of Systems  currently breastfeeding.There is no height or weight on file to calculate BMI.  General Appearance: {Appearance:22683}  Eye Contact:  {BHH EYE CONTACT:22684}  Speech:  {Speech:22685}  Volume:  {Volume (PAA):22686}  Mood:  {BHH MOOD:22306}  Affect:  {Affect (PAA):22687}  Thought Process:  {Thought Process (PAA):22688}  Orientation:  {BHH ORIENTATION (PAA):22689}  Thought Content: {Thought Content:22690}   Suicidal Thoughts:  {ST/HT (PAA):22692}  Homicidal Thoughts:  {ST/HT (PAA):22692}  Memory:  {BHH MEMORY:22881}  Judgement:  {Judgement (PAA):22694}  Insight:  {Insight (PAA):22695}  Psychomotor Activity:  {Psychomotor (PAA):22696}  Concentration:  {Concentration:21399}  Recall:  {BHH GOOD/FAIR/POOR:22877}  Fund of Knowledge: {BHH GOOD/FAIR/POOR:22877}  Language: {BHH GOOD/FAIR/POOR:22877}  Akathisia:  {BHH YES OR NO:22294}  Handed:  {Handed:22697}  AIMS (if indicated): {Desc; done/not:10129}  Assets:  {Assets (PAA):22698}  ADL's:  {BHH NAT'F:57322}  Cognition: {chl bhh cognition:304700322}  Sleep:  {BHH GOOD/FAIR/POOR:22877}   Screenings: AIMS     Admission (Discharged) from 11/29/2014 in [place] [facility]  AIMS Total Score 0    AUDIT     Admission  (Discharged) from 11/29/2014 in [place] [facility]  Alcohol Use Disorder Identification Test Final Score (AUDIT) 13    GAD-7     Telemedicine from 10/03/2019 in Primary Care at [place] from 03/19/2019 in [place] for [place] from 03/05/2019 in [facility] for [facility] Routine Prenatal from 02/05/2019 in [facility] for [facility] Postpartum Visit from 04/07/2016 in [place] [employer] for [facility]  Total GAD-7 Score 4 9 11 7 8     PHQ2-9     Telemedicine from 10/03/2019 in Primary Care at [place] from 03/19/2019 in [place] for [place] from 03/05/2019 in [place] for [facility] Routine Prenatal from 02/05/2019 in [place] for [facility] Video Visit from 01/22/2019 in Center for  [place]  PHQ-2 Total Score 4 6 5 4 4   PHQ-9 Total Score 13 16 14 11 9        Assessment and Plan: ***   [name], PA 12/19/2019, 8:37 PM

## 2019-12-20 ENCOUNTER — Ambulatory Visit (INDEPENDENT_AMBULATORY_CARE_PROVIDER_SITE_OTHER): Payer: Medicaid Other | Admitting: Internal Medicine

## 2019-12-20 DIAGNOSIS — Z0000 Encounter for general adult medical examination without abnormal findings: Secondary | ICD-10-CM

## 2019-12-20 DIAGNOSIS — Z1159 Encounter for screening for other viral diseases: Secondary | ICD-10-CM

## 2019-12-20 DIAGNOSIS — Z13228 Encounter for screening for other metabolic disorders: Secondary | ICD-10-CM

## 2019-12-20 DIAGNOSIS — Z113 Encounter for screening for infections with a predominantly sexual mode of transmission: Secondary | ICD-10-CM

## 2019-12-20 DIAGNOSIS — Z130 Encounter for screening for diseases of the blood and blood-forming organs and certain disorders involving the immune mechanism: Secondary | ICD-10-CM

## 2019-12-20 DIAGNOSIS — Z124 Encounter for screening for malignant neoplasm of cervix: Secondary | ICD-10-CM

## 2019-12-20 DIAGNOSIS — Z8632 Personal history of gestational diabetes: Secondary | ICD-10-CM

## 2019-12-20 DIAGNOSIS — Z13220 Encounter for screening for lipoid disorders: Secondary | ICD-10-CM

## 2019-12-26 NOTE — Patient Instructions (Signed)

## 2019-12-29 NOTE — Progress Notes (Signed)
Psychiatric Initial Adult Assessment  Virtual Visit via Video Note  I connected with [name] on 11/30/2019 at  1:00 PM EDT by a video enabled telemedicine application and verified that I am speaking with the correct person using two identifiers.  Location: Patient: Home Provider: Clinic   I discussed the limitations of evaluation and management by telemedicine and the availability of in person appointments. The patient expressed understanding and agreed to proceed.  Follow Up Instructions:  I discussed the assessment and treatment plan with the patient. The patient was provided an opportunity to ask questions and all were answered. The patient agreed with the plan and demonstrated an understanding of the instructions.   The patient was advised to call back or seek an in-person evaluation if the symptoms worsen or if the condition fails to improve as anticipated.  I provided 47 minutes of non-face-to-face time during this encounter.   Katrina Mood, PA   Patient Identification: Katrina Stewart MRN:  [id number] Date of Evaluation:  11/30/2019 Referral Source: Referral by OBGYN Chief Complaint:  "Doctor wanted me to be seen." Visit Diagnosis:    ICD-10-CM   1. Bipolar affective disorder, current episode mixed, current episode severity unspecified ([place])  F31.60 DISCONTINUED: QUEtiapine (SEROQUEL) 50 MG tablet    History of Present Illness:  Katrina Stewart is a 24 year old female with a past psychiatric history significant for depression who presents to [facility] via virtual visit for "Doctor wanted me to be seen." Patient states she has a history of depression (12 - 13 years) and is currently being managed on Zoloft by her OBGYN provider. Patient is currently taking 150 mg 1 x daily. Patient's provider does not want to increase her dosage on Zoloft until she is assessed by a psychiatric provider. Patient's OBGYN provider would  like to rule out bipolar disorder.  Patient states that the Zoloft has not been addressing her needs. She reports that Zoloft is the only psychiatric medication she is taking. Patient states that her Stewart has been ok but she is prone to experiencing Stewart swings. Patient states that she experiences Stewart swings just about everyday without any triggers. Patient's biggest concern is her Stewart swings; one minute she is playful and happy then the next she is lashing out. Patient states that she does not want to lash out at her kids.  Patient endorses having a "sex problem" whenever she is in a good Stewart. Patient recalls an incident last month where she felt high on life for 2 weeks. Patient states her home life has been on. She is currently single and is currently not working.  Patient endorses suicide ideation with no plan. Patient states she has thoughts of harming herself once or twice a month. Patient has engaged in self-injurious behavior in the past via cutting but has not harmed herself in the past 4 months. Patient denies homicide ideation. Patient endorses visual hallucinations that manifest as shadows. She denies auditory hallucinations. Patient endorses alcohol use and drinks liquor everyday. Patient denies tobacco and illicit drug use.  Associated Signs/Symptoms: Depression Symptoms:  depressed Stewart, anhedonia, psychomotor agitation, feelings of worthlessness/guilt, suicidal thoughts without plan, anxiety, disturbed sleep, decreased appetite, (Hypo) Manic Symptoms:  Distractibility, Flight of Ideas, Grandiosity, Hallucinations, Irritable Stewart, Labiality of Stewart, Sexually Inapproprite Behavior, Anxiety Symptoms:  Excessive Worry, Social Anxiety, Psychotic Symptoms:  Hallucinations: Visual PTSD Symptoms: Re-experiencing:  Flashbacks Nightmares  Past Psychiatric History: Depression  Previous Psychotropic Medications: Yes   Substance Abuse History in  the last 12 months:   No.  Consequences of Substance Abuse: NA  Past Medical History:  Past Medical History:  Diagnosis Date  . Anxiety   . COVID-19 virus infection 12/05/2018   + again on 2/4 (asymptomatic)  postiive test in MAU on 11-2  . Depression   . Depression    Phreesia 12/17/2019  . Essential hypertension   . Gestational diabetes mellitus (GDM)    Normal postpartum 2 hr GTT  . Group B streptococcal infection in pregnancy 03/13/2019  . Rh negative state in antepartum period 09/03/2015   [x]  needs rhogam at 28 weeks  [name] postive anti-D on initial lab. Patient received rhogam on 08/02/18 during MAU visit  . Rubella non-immune status, antepartum 09/03/2015  . Vaginal Pap smear, abnormal     Past Surgical History:  Procedure Laterality Date  . BREAST SURGERY  2013   Lt & Rt excision juvenile fibroadenoma  . MASS EXCISION  10/29/2011   Procedure: EXCISION MASS;  Surgeon: Harl Bowie, MD;  Location: [facility];  Service: General;  Laterality: Bilateral;  excision of bilateral breast masses    Family Psychiatric History: Patient unsure  Family History:  Family History  Problem Relation Age of Onset  . Cancer Maternal Grandmother        lung cancer  . Cancer Other        bladder cancer  . Asthma Brother     Social History:   Social History   Socioeconomic History  . Marital status: Single    Spouse name: Not on file  . Number of children: Not on file  . Years of education: Not on file  . Highest education level: Not on file  Occupational History    Comment: unemployed  Tobacco Use  . Smoking status: Never Smoker  . Smokeless tobacco: Never Used  Vaping Use  . Vaping Use: Never used  Substance and Sexual Activity  . Alcohol use: Not Currently    Alcohol/week: 2.0 - 3.0 standard drinks    Types: 2 - 3 Shots of liquor per week    Comment: every weekend before pregnancy (4-5 drinks)  . Drug use: No  . Sexual activity: Not Currently    Birth control/protection: None  Other  Topics Concern  . Not on file  Social History Narrative  . Not on file   Social Determinants of Health   Financial Resource Strain:   . Difficulty of Paying Living Expenses: Not on file  Food Insecurity:   . Worried About Charity fundraiser in the Last Year: Not on file  . Ran Out of Food in the Last Year: Not on file  Transportation Needs:   . Lack of Transportation (Medical): Not on file  . Lack of Transportation (Non-Medical): Not on file  Physical Activity:   . Days of Exercise per Week: Not on file  . Minutes of Exercise per Session: Not on file  Stress:   . Feeling of Stress : Not on file  Social Connections:   . Frequency of Communication with Friends and Family: Not on file  . Frequency of Social Gatherings with Friends and Family: Not on file  . Attends Religious Services: Not on file  . Active Member of Clubs or Organizations: Not on file  . Attends Archivist Meetings: Not on file  . Marital Status: Not on file    Additional Social History:  Allergies:  No Known Allergies  Metabolic Disorder Labs: Lab Results  Component  Value Date   HGBA1C 5.7 (H) 09/14/2018   No results found for: PROLACTIN Lab Results  Component Value Date   CHOL 153 07/18/2009   TRIG 78 07/18/2009   HDL 47 07/18/2009   CHOLHDL 3.3 Ratio 07/18/2009   VLDL 16 07/18/2009   LDLCALC 90 07/18/2009   Lab Results  Component Value Date   TSH 3.420 12/03/2014    Therapeutic Level Labs: No results found for: LITHIUM No results found for: CBMZ No results found for: VALPROATE  Current Medications: Current Outpatient Medications  Medication Sig Dispense Refill  . ARIPiprazole (ABILIFY) 5 MG tablet Take 1 tablet (5 mg total) by mouth daily. Take 1 tablet (5 mg) by mouth for one (1) week. If tolerating medication well, increase by 5 mg after one (1) week for a total of 10 mg by mouth daily. 30 tablet 1  . sertraline (ZOLOFT) 100 MG tablet Take 1.5 tablets (150 mg total) by mouth  daily. 60 tablet 3  . traZODone (DESYREL) 50 MG tablet Take 1 tablet (50 mg total) by mouth at bedtime. 30 tablet 1   No current facility-administered medications for this visit.    Musculoskeletal: Strength & Muscle Tone: Not able to assess due to telehealth visit [place]: Not able to assess due to telehealth visit Patient leans: Not able to assess due to telehealth visit  Psychiatric Specialty Exam: Review of Systems  Psychiatric/Behavioral: Positive for agitation, hallucinations (Visual: shadow people), sleep disturbance and suicidal ideas (Patient reports experiencing sucidal ideations 1 -2 times of month but denies ever having a plan). Negative for decreased concentration and self-injury. The patient is nervous/anxious.     currently breastfeeding.There is no height or weight on file to calculate BMI.  General Appearance: Well Groomed  Eye Contact:  Good  Speech:  Clear and Coherent and Normal Rate  Volume:  Normal  Stewart:  Euthymic  Affect:  Appropriate  Thought Process:  Coherent and Goal Directed  Orientation:  Full (Time, Place, and Person)  Thought Content:  WDL and Logical  Suicidal Thoughts:  Yes.  without intent/plan  Homicidal Thoughts:  No  Memory:  Immediate;   Good Recent;   Good Remote;   Good  Judgement:  Good  Insight:  Fair  Psychomotor Activity:  Normal  Concentration:  Concentration: Good and Attention Span: Good  Recall:  Good  Fund of Knowledge:Good  Language: Good  Akathisia:  NA  Handed:  Right  AIMS (if indicated):  not done  Assets:  Communication Skills Desire for Improvement Housing  ADL's:  Intact  Cognition: WNL  Sleep:  Fair   Screenings: AIMS     Admission (Discharged) from 11/29/2014 in [place] [facility]  AIMS Total Score 0    AUDIT     Admission (Discharged) from 11/29/2014 in [place] [facility]  Alcohol Use Disorder Identification Test Final Score (AUDIT) 13     GAD-7     Telemedicine from 10/03/2019 in Primary Care at [place] from 03/19/2019 in [place] for [place] from 03/05/2019 in [place] for [facility] Routine Prenatal from 02/05/2019 in [place] for [facility] Postpartum Visit from 04/07/2016 in [place] for [facility]  Total GAD-7 Score 4 9 11 7 8     PHQ2-9     Telemedicine from 10/03/2019 in Primary Care at [place] from 03/19/2019 in [place] for [place] from 03/05/2019 in Center  for [place] Routine Prenatal from 02/05/2019 in [place] for [facility] Video Visit from 01/22/2019 in Center for [facility]  PHQ-2 Total Score 4 6 5 4 4   PHQ-9 Total Score 13 16 14 11 9       Assessment and Plan:  Syble Picco is a 24 year old female with a past psychiatric history significant for depression who presents to [facility] via virtual visit for "Doctor wanted me to be seen." Patient states she has a history of depression (12 - 13 years) and is currently being managed on Zoloft by her OBGYN provider.  Patient's provider does not want to increase her dosage on Zoloft until she is assessed by a psychiatric provider. Patient's OBGYN provider would like to rule out bipolar disorder. Based on my assessment, patient appears to have some form of bipolar disorder accompanied by symptoms of mania (sexual behavior, grandiosity, flight of ideas, and sleep deficit). Patient was recommended Seroquel 50 mg for the management of bipolar disorder and related symptoms of hypomania/mania.  1. Bipolar affective disorder, current episode mixed, current episode severity unspecified ([place]) Patient started on Quetiapine (Seroquel) 50 mg, 1 tablet by mouth at bedtime  Patient to follow in 5 weeks  Katrina Mood,  PA 11/27/20218:10 AM

## 2019-12-30 ENCOUNTER — Encounter (HOSPITAL_COMMUNITY): Payer: Self-pay | Admitting: Physician Assistant

## 2019-12-31 ENCOUNTER — Other Ambulatory Visit (HOSPITAL_COMMUNITY)
Admission: RE | Admit: 2019-12-31 | Discharge: 2019-12-31 | Disposition: A | Discharge status: Home / Self Care | Payer: Medicaid Other | Source: Ambulatory Visit | Attending: Internal Medicine | Admitting: Internal Medicine

## 2019-12-31 ENCOUNTER — Encounter: Payer: Self-pay | Admitting: Internal Medicine

## 2019-12-31 ENCOUNTER — Other Ambulatory Visit: Payer: Self-pay

## 2019-12-31 ENCOUNTER — Ambulatory Visit (INDEPENDENT_AMBULATORY_CARE_PROVIDER_SITE_OTHER): Payer: Medicaid Other | Admitting: Internal Medicine

## 2019-12-31 VITALS — BP 120/82 | HR 94 | Temp 97.5°F | Resp 17 | Ht 66.5 in | Wt 262.0 lb

## 2019-12-31 DIAGNOSIS — Z8632 Personal history of gestational diabetes: Secondary | ICD-10-CM | POA: Diagnosis not present

## 2019-12-31 DIAGNOSIS — Z124 Encounter for screening for malignant neoplasm of cervix: Secondary | ICD-10-CM | POA: Diagnosis not present

## 2019-12-31 DIAGNOSIS — Z13228 Encounter for screening for other metabolic disorders: Secondary | ICD-10-CM

## 2019-12-31 DIAGNOSIS — Z1159 Encounter for screening for other viral diseases: Secondary | ICD-10-CM

## 2019-12-31 DIAGNOSIS — Z113 Encounter for screening for infections with a predominantly sexual mode of transmission: Secondary | ICD-10-CM | POA: Insufficient documentation

## 2019-12-31 DIAGNOSIS — Z13220 Encounter for screening for lipoid disorders: Secondary | ICD-10-CM

## 2019-12-31 DIAGNOSIS — Z0000 Encounter for general adult medical examination without abnormal findings: Secondary | ICD-10-CM | POA: Diagnosis not present

## 2019-12-31 DIAGNOSIS — O10919 Unspecified pre-existing hypertension complicating pregnancy, unspecified trimester: Secondary | ICD-10-CM

## 2019-12-31 DIAGNOSIS — Z130 Encounter for screening for diseases of the blood and blood-forming organs and certain disorders involving the immune mechanism: Secondary | ICD-10-CM | POA: Diagnosis not present

## 2019-12-31 LAB — CERVICOVAGINAL ANCILLARY ONLY
Bacterial Vaginitis (gardnerella): POSITIVE — AB
Candida Glabrata: NEGATIVE
Candida Vaginitis: NEGATIVE
Chlamydia: NEGATIVE
Comment: NEGATIVE
Comment: NEGATIVE
Comment: NEGATIVE
Comment: NEGATIVE
Comment: NEGATIVE
Comment: NORMAL
Neisseria Gonorrhea: NEGATIVE
Trichomonas: NEGATIVE

## 2019-12-31 NOTE — Progress Notes (Signed)
Subjective:    Katrina Stewart - 24 y.o. female MRN [id number]  Date of birth: [birth year]  HPI  Katrina Stewart is here for annual exam. Patient has no concerns. Has established with psych for Bipolar and has been started on some new medications.      Health Maintenance:  Health Maintenance Due  Topic Date Due  . Hepatitis C Screening  Never done  . INFLUENZA VACCINE  09/02/2019    -  reports that she has never smoked. She has never used smokeless tobacco. - Review of Systems: Per HPI. - Past Medical History: Patient Active Problem List   Diagnosis Date Noted  . History of gestational diabetes 10/03/2019  . Chronic hypertension during pregnancy 11/20/2018  . History of depression 08/29/2018  . Severe episode of recurrent major depressive disorder, without psychotic features ([place])   . Alcohol use disorder    - Medications: reviewed and updated   Objective:   Physical Exam BP 120/82   Pulse 94   Temp (!) 97.5 F (36.4 C) (Temporal)   Resp 17   Ht 5' 6.5" (1.689 m)   Wt 262 lb (118.8 kg)   LMP 12/17/2019 (Exact Date)   SpO2 97%   BMI 41.65 kg/m  Physical Exam Constitutional:      Appearance: She is not diaphoretic.  HENT:     Head: Normocephalic and atraumatic.  Eyes:     Conjunctiva/sclera: Conjunctivae normal.     Pupils: Pupils are equal, round, and reactive to light.  Neck:     Thyroid: No thyromegaly.  Cardiovascular:     Rate and Rhythm: Normal rate and regular rhythm.     Heart sounds: Normal heart sounds. No murmur heard.   Pulmonary:     Effort: Pulmonary effort is normal. No respiratory distress.     Breath sounds: Normal breath sounds. No wheezing.  Abdominal:     General: Bowel sounds are normal. There is no distension.     Palpations: Abdomen is soft.     Tenderness: There is no abdominal tenderness. There is no guarding or rebound.  Genitourinary:    Comments: GU/GYN: Exam performed in the presence of a chaperone. External genitalia  within normal limits.  Vaginal mucosa pink, moist, normal rugae.  Nonfriable cervix without lesions, no discharge or bleeding noted on speculum exam.  Bimanual exam revealed normal, nongravid uterus.  No cervical motion tenderness. No adnexal masses bilaterally.   Musculoskeletal:        General: No deformity. Normal range of motion.     Cervical back: Normal range of motion and neck supple.  Lymphadenopathy:     Cervical: No cervical adenopathy.  Skin:    General: Skin is warm and dry.     Findings: No rash.  Neurological:     Mental Status: She is alert and oriented to person, place, and time.     Gait: Gait is intact.  Psychiatric:        Mood and Affect: Mood and affect normal.        Judgment: Judgment normal.            Assessment & Plan:   1. Annual physical exam Counseled on 150 minutes of exercise per week, healthy eating (including decreased daily intake of saturated fats, cholesterol, added sugars, sodium), STI prevention, routine healthcare maintenance. - CBC with Differential - Comprehensive metabolic panel - Lipid Panel  2. Screening for deficiency anemia - CBC with Differential  3. Screening for metabolic disorder - Comprehensive  metabolic panel  4. Lipid screening - Lipid Panel  5. Need for hepatitis C screening test - HCV Ab w/Rflx to Verification  6. Pap smear for cervical cancer screening - Cytology - PAP([place])  7. Screening for STDs (sexually transmitted diseases) - Cervicovaginal ancillary only - RPR - HIV antibody (with reflex)  8. History of gestational diabetes Will monitor A1c due to history of GDM.  - Hemoglobin A1c  9. Chronic hypertension during pregnancy Reviewed that BP is within normal range. Will monitor as at increased risk of HTN outside of pregnancy.    Phill Myron, D.O. 12/31/2019, 10:02 AM Primary Care at [facility]

## 2020-01-01 LAB — CBC WITH DIFFERENTIAL/PLATELET
Basophils Absolute: 0.1 10*3/uL (ref 0.0–0.2)
Basos: 1 %
EOS (ABSOLUTE): 0.2 10*3/uL (ref 0.0–0.4)
Eos: 2 %
Hematocrit: 39.5 % (ref 34.0–46.6)
Hemoglobin: 12.3 g/dL (ref 11.1–15.9)
Immature Grans (Abs): 0 10*3/uL (ref 0.0–0.1)
Immature Granulocytes: 0 %
Lymphocytes Absolute: 3.6 10*3/uL — ABNORMAL HIGH (ref 0.7–3.1)
Lymphs: 48 %
MCH: 22.7 pg — ABNORMAL LOW (ref 26.6–33.0)
MCHC: 31.1 g/dL — ABNORMAL LOW (ref 31.5–35.7)
MCV: 73 fL — ABNORMAL LOW (ref 79–97)
Monocytes Absolute: 0.4 10*3/uL (ref 0.1–0.9)
Monocytes: 5 %
Neutrophils Absolute: 3.3 10*3/uL (ref 1.4–7.0)
Neutrophils: 44 %
Platelets: 363 10*3/uL (ref 150–450)
RBC: 5.41 x10E6/uL — ABNORMAL HIGH (ref 3.77–5.28)
RDW: 15.2 % (ref 11.7–15.4)
WBC: 7.6 10*3/uL (ref 3.4–10.8)

## 2020-01-01 LAB — COMPREHENSIVE METABOLIC PANEL
ALT: 14 IU/L (ref 0–32)
AST: 11 IU/L (ref 0–40)
Albumin/Globulin Ratio: 1.9 (ref 1.2–2.2)
Albumin: 4.7 g/dL (ref 3.9–5.0)
Alkaline Phosphatase: 67 IU/L (ref 44–121)
BUN/Creatinine Ratio: 11 (ref 9–23)
BUN: 9 mg/dL (ref 6–20)
Bilirubin Total: <0.2 mg/dL (ref 0.0–1.2)
CO2: 20 mmol/L (ref 20–29)
Calcium: 9.6 mg/dL (ref 8.7–10.2)
Chloride: 103 mmol/L (ref 96–106)
Creatinine, Ser: 0.81 mg/dL (ref 0.57–1.00)
GFR calc Af Amer: 116 mL/min/{1.73_m2} (ref >59)
GFR calc non Af Amer: 101 mL/min/{1.73_m2} (ref >59)
Globulin, Total: 2.5 g/dL (ref 1.5–4.5)
Glucose: 85 mg/dL (ref 65–99)
Potassium: 4 mmol/L (ref 3.5–5.2)
Sodium: 139 mmol/L (ref 134–144)
Total Protein: 7.2 g/dL (ref 6.0–8.5)

## 2020-01-01 LAB — LIPID PANEL
Chol/HDL Ratio: 3.7 ratio (ref 0.0–4.4)
Cholesterol, Total: 149 mg/dL (ref 100–199)
HDL: 40 mg/dL (ref >39)
LDL Chol Calc (NIH): 97 mg/dL (ref 0–99)
Triglycerides: 58 mg/dL (ref 0–149)
VLDL Cholesterol Cal: 12 mg/dL (ref 5–40)

## 2020-01-01 LAB — HEMOGLOBIN A1C
Est. average glucose Bld gHb Est-mCnc: 117 mg/dL
Hgb A1c MFr Bld: 5.7 % — ABNORMAL HIGH (ref 4.8–5.6)

## 2020-01-01 LAB — RPR: RPR Ser Ql: NONREACTIVE

## 2020-01-01 LAB — HIV ANTIBODY (ROUTINE TESTING W REFLEX): HIV Screen 4th Generation wRfx: NONREACTIVE

## 2020-01-01 LAB — HCV AB W/RFLX TO VERIFICATION: HCV Ab: <0.1 s/co ratio (ref 0.0–0.9)

## 2020-01-01 LAB — HCV INTERPRETATION

## 2020-01-02 ENCOUNTER — Telehealth (HOSPITAL_COMMUNITY): Payer: Medicaid Other | Admitting: Physician Assistant

## 2020-01-02 ENCOUNTER — Other Ambulatory Visit: Payer: Self-pay

## 2020-01-02 LAB — CYTOLOGY - PAP
Adequacy: ABSENT
Diagnosis: NEGATIVE

## 2020-01-30 ENCOUNTER — Telehealth (INDEPENDENT_AMBULATORY_CARE_PROVIDER_SITE_OTHER): Payer: Medicaid Other | Admitting: Physician Assistant

## 2020-01-30 ENCOUNTER — Other Ambulatory Visit: Payer: Self-pay

## 2020-01-30 DIAGNOSIS — G4700 Insomnia, unspecified: Secondary | ICD-10-CM | POA: Diagnosis not present

## 2020-01-30 DIAGNOSIS — F3160 Bipolar disorder, current episode mixed, unspecified: Secondary | ICD-10-CM

## 2020-01-30 NOTE — Progress Notes (Signed)
BH MD/PA/NP OP Progress Note  Virtual Visit via Video Note  I connected with Katrina Stewart on 01/30/2020 at  4:00 PM EST by a video enabled telemedicine application and verified that I am speaking with the correct person using two identifiers.  Location: Patient: Home Provider: Clinic   I discussed the limitations of evaluation and management by telemedicine and the availability of in person appointments. The patient expressed understanding and agreed to proceed.  Follow Up Instructions:  I discussed the assessment and treatment plan with the patient. The patient was provided an opportunity to ask questions and all were answered. The patient agreed with the plan and demonstrated an understanding of the instructions.   The patient was advised to call back or seek an in-person evaluation if the symptoms worsen or if the condition fails to improve as anticipated.  I provided 27 minutes of non-face-to-face time during this encounter.   Meta Hatchet, PA   01/30/2020 4:00 PM Burma Ketcher  MRN:  [id number]  Chief Complaint: Follow up and medication management  HPI:  Katrina Stewart is a 24 year old female with a past psychiatric history significant for bipolar disorder and insomnia who presents to [facility] via virtual video visit for follow-up and medication management.  Patient is currently being managed on the following medications:  Abilify 10 mg daily Trazodone 50 mg at bedtime  Patient states that since being placed on trazodone, her sleep has improved greatly.  Patient states that her mood is still a little crazy.  She endorses that her mood often feels neutral but she is still prone to having mood swings.  In addition to her mood swings, patient states that she has been feeling more depressed.  Patient endorses difficulty concentrating, lack of energy lack of motivation, and difficulty getting out of bed.  Patient also has been  experiencing the need to throw up her food as soon as she eats. Patient states that the last episode of voluvtarily vomiting her previous meal occurred 2 weeks ago. Patient's main concern today is getting her mood under control.  Patient endorses depressed mood.  She endorses passive suicidal ideations with no plan or intent. Patient reports that she recently used a razor blade to cut her arm roughly a month ago.  Patient denies homicidal ideations.  She further denies auditory or visual hallucinations.  Patient endorses good sleep and receives on average 6 to 8 hours of restful sleep.  Patient states that her sleep is usually good as long as she has trazodone present.  Patient endorses decreased appetite and states that she often has to force herself to eat.  Patient states that she will at least eat 1 meal per day.  Patient endorses alcohol consumption nearly every day.  She denies tobacco use and illicit drug use.   Visit Diagnosis:    ICD-10-CM   1. Bipolar affective disorder, current episode mixed, current episode severity unspecified (HCC)  F31.60 FLUoxetine (PROZAC) 20 MG capsule    ARIPiprazole (ABILIFY) 15 MG tablet  2. Insomnia, unspecified type  G47.00 traZODone (DESYREL) 50 MG tablet    Past Psychiatric History:  Bipolar disorder Insomnia  Past Medical History:  Past Medical History:  Diagnosis Date  . Anxiety   . COVID-19 virus infection 12/05/2018   + again on 2/4 (asymptomatic)  postiive test in [facility]U on 11-2  . Depression   . Depression    Phreesia 12/17/2019  . Depression    Phreesia 12/30/2019  . Essential  hypertension   . Gestational diabetes mellitus (GDM)    Normal postpartum 2 hr GTT  . Group B streptococcal infection in pregnancy 03/13/2019  . Rh negative state in antepartum period 09/03/2015   [x]  needs rhogam at 28 weeks  [name] postive anti-D on initial lab. Patient received rhogam on 08/02/18 during MAU visit  . Rubella non-immune status, antepartum 09/03/2015  .  Vaginal Pap smear, abnormal     Past Surgical History:  Procedure Laterality Date  . BREAST SURGERY  2013   Lt & Rt excision juvenile fibroadenoma  . MASS EXCISION  10/29/2011   Procedure: EXCISION MASS;  Surgeon: Harl Bowie, MD;  Location: [facility];  Service: General;  Laterality: Bilateral;  excision of bilateral breast masses    Family Psychiatric History: Patient unsure  Family History:  Family History  Problem Relation Age of Onset  . Cancer Maternal Grandmother        lung cancer  . Cancer Other        bladder cancer  . Asthma Brother     Social History:  Social History   Socioeconomic History  . Marital status: Single    Spouse name: Not on file  . Number of children: Not on file  . Years of education: Not on file  . Highest education level: Not on file  Occupational History    Comment: unemployed  Tobacco Use  . Smoking status: Never Smoker  . Smokeless tobacco: Never Used  Vaping Use  . Vaping Use: Never used  Substance and Sexual Activity  . Alcohol use: Not Currently    Alcohol/week: 2.0 - 3.0 standard drinks    Types: 2 - 3 Shots of liquor per week    Comment: every weekend before pregnancy (4-5 drinks)  . Drug use: No  . Sexual activity: Not Currently    Birth control/protection: None  Other Topics Concern  . Not on file  Social History Narrative  . Not on file   Social Determinants of Health   Financial Resource Strain: Not on file  Food Insecurity: Not on file  Transportation Needs: Not on file  Physical Activity: Not on file  Stress: Not on file  Social Connections: Not on file    Allergies: No Known Allergies  Metabolic Disorder Labs: Lab Results  Component Value Date   HGBA1C 5.7 (H) 12/31/2019   No results found for: PROLACTIN Lab Results  Component Value Date   CHOL 149 12/31/2019   TRIG 58 12/31/2019   HDL 40 12/31/2019   CHOLHDL 3.7 12/31/2019   VLDL 16 07/18/2009   LDLCALC 97 12/31/2019   [place] 90 07/18/2009    Lab Results  Component Value Date   TSH 3.420 12/03/2014    Therapeutic Level Labs: No results found for: LITHIUM No results found for: VALPROATE No components found for:  CBMZ  Current Medications: Current Outpatient Medications  Medication Sig Dispense Refill  . ARIPiprazole (ABILIFY) 15 MG tablet Take 1 tablet (15 mg total) by mouth daily. 30 tablet 1  . FLUoxetine (PROZAC) 20 MG capsule Take 1 capsule (20 mg total) by mouth daily. Take 1 capsule (20 mg total) for the first 2 weeks. Take 2 capsules (40 mg total) at the start of the 3rd week for 2 more weeks. Take 3 capsules (60 mg total) at the start of the 5th week and beyond. 84 capsule 0  . traZODone (DESYREL) 50 MG tablet Take 1 tablet (50 mg total) by mouth at bedtime. 30 tablet  1   No current facility-administered medications for this visit.     Musculoskeletal: Strength & Muscle Tone: Patient unable to assess due to telemedicine visit [place]: Patient unable to assess due to telemedicine visit Patient leans: Patient unable to assess due to telemedicine visit  Psychiatric Specialty Exam: Review of Systems  Psychiatric/Behavioral: Positive for agitation, decreased concentration, dysphoric mood, self-injury (Patient reports that sheused a razor blade to cut herself last month) and suicidal ideas. Negative for hallucinations and sleep disturbance. The patient is not nervous/anxious and is not hyperactive.     currently breastfeeding.There is no height or weight on file to calculate BMI.  General Appearance: Well Groomed  Eye Contact:  Good  Speech:  Clear and Coherent and Normal Rate  Volume:  Normal  Mood:  Depressed, Dysphoric and Irritable  Affect:  Congruent and Depressed  Thought Process:  Coherent, Goal Directed and Descriptions of Associations: Intact  Orientation:  Full (Time, Place, and Person)  Thought Content: WDL and Logical   Suicidal Thoughts:  Yes.  without intent/plan  Homicidal Thoughts:  No   Memory:  Immediate;   Good Recent;   Good Remote;   Good  Judgement:  Fair  Insight:  Fair  Psychomotor Activity:  Normal  Concentration:  Concentration: Good and Attention Span: Good  Recall:  Good  Fund of Knowledge: Good  Language: Good  Akathisia:  NA  Handed:  Right  AIMS (if indicated): not done  Assets:  Communication Skills Desire for Improvement Housing  ADL's:  Intact  Cognition: WNL  Sleep:  Good   Screenings: AIMS   Flowsheet Row Admission (Discharged) from 11/29/2014 in [place] [facility]  AIMS Total Score 0    AUDIT   Flowsheet Row Admission (Discharged) from 11/29/2014 in [place] [facility]  Alcohol Use Disorder Identification Test Final Score (AUDIT) 13    GAD-7   Flowsheet Row Telemedicine from 10/03/2019 in Primary Care at [place] from 03/19/2019 in [place] for [place] from 03/05/2019 in [place] for [facility] Routine Prenatal from 02/05/2019 in [place] for [facility] Postpartum Visit from 04/07/2016 in [place] for [facility]  Total GAD-7 Score 4 9 11 7 8     PHQ2-9   [place] from 10/03/2019 in Primary Care at [place] from 03/19/2019 in [place] for [place] from 03/05/2019 in [place] for [facility] Routine Prenatal from 02/05/2019 in [place] for [facility] Video Visit from 01/22/2019 in [place] for [facility]  PHQ-2 Total Score 4 6 5 4 4   PHQ-9 Total Score 13 16 14 11 9        Assessment and Plan:   Leonardo Thornberg is a 24 year old female with a past psychiatric history significant for bipolar disorder and insomnia who presents to [facility] via virtual video visit for follow-up and medication management. Patient's main concern  today is getting her mood under control.  Patient instances of mood swings and depressed mood.  Patient endorses passive suicidal ideations and has engaged in self-injurious behavior within the past month.  Patient does not feel like she is a danger to herself at this time and is able to contract for safety.  Patient was recommended increasing her Abilify dosage from 10 mg to 15 mg.  Patient was also recommended being placed on Prozac 20 mg with the plan to titrate up  to 60 mg over a 5-week interval. Patient was advised that Prozac has indications for managing bulimia nervosa. Patient was agreeable to recommendations.  Patient's medications will be e-prescribed to pharmacy of choice.  1. Bipolar affective disorder, current episode mixed, current episode severity unspecified (HCC)  - FLUoxetine (PROZAC) 20 MG capsule; Take 1 capsule (20 mg total) by mouth daily. Take 1 capsule (20 mg total) for the first 2 weeks. Take 2 capsules (40 mg total) at the start of the 3rd week for 2 more weeks. Take 3 capsules (60 mg total) at the start of the 5th week and beyond.  Dispense: 84 capsule; Refill: 0 - ARIPiprazole (ABILIFY) 15 MG tablet; Take 1 tablet (15 mg total) by mouth daily.  Dispense: 30 tablet; Refill: 1  2. Insomnia, unspecified type  - traZODone (DESYREL) 50 MG tablet; Take 1 tablet (50 mg total) by mouth at bedtime.  Dispense: 30 tablet; Refill: 1  Patient to follow up in 6 weeks  [name], PA 01/31/2020, 12:15 AM

## 2020-01-31 MED ORDER — ARIPIPRAZOLE 15 MG PO TABS: 15.0000 mg | ORAL_TABLET | Freq: Every day | ORAL | 1 refills | Status: DC

## 2020-01-31 MED ORDER — FLUOXETINE HCL 20 MG PO CAPS: 20.0000 mg | ORAL_CAPSULE | Freq: Every day | ORAL | 0 refills | Status: DC

## 2020-01-31 MED ORDER — TRAZODONE HCL 50 MG PO TABS: 50.0000 mg | ORAL_TABLET | Freq: Every day | ORAL | 1 refills | Status: DC

## 2020-01-31 NOTE — Telephone Encounter (Signed)
Patient has since spoken with provider concerning medication during a follow-up appt. Encounter being closed at this time.

## 2020-01-31 NOTE — Progress Notes (Signed)
Katrina Stewart is a 24 year old female with a past psychiatric history significant for bipolar disorder who was last seen at [facility] via telepsych on 11/30/2019 for medication management.  Upon conclusion of the encounter, patient was placed on the following medication:  Quetiapine (Seroquel) 50 mg 1 tablet by mouth at bedtime for the management of bipolar disorder accompanied by symptoms of mania (sexual behavior, grandiosity, flight of ideas, and sleep deficit).  Patient is calling today on 12/14/2019 for medication reaction to Seroquel.  Patient states that she has experienced unintended weight gain.  In addition to the weight gain, patient states that she is still experiencing bad mood swings.  Patient does endorse that the medication has helped with her sleep.  Patient states that she would like to discontinue her Seroquel due to medication reaction.  Writer recommended placing patient on the following medications:  Abilify 5 mg daily for the management of mood Trazodone 50 mg at bedtime for the management of sleep disturbances  Writer informed patient that if she was tolerating her Abilify that she could increase the dosage to 10 mg after the first week on Abilify.  Patient is agreeable to recommendations.  New medications will be e-prescribed to pharmacy of choice following the conclusion of the encounter.  Patient's next appointment is scheduled for 12/19/2019.

## 2020-01-31 NOTE — Telephone Encounter (Signed)
Writer spoke with patient regarding medication reaction to Seroquel. Writer recommended patient be placed on the following medications:  Abilify 5 mg daily for the management of mood Trazodone 50 mg at bedtime for the management of sleep disturbances  Patient was informed that if she was tolerating medication well, she could increase the dosage of Abilify to 10 mg daily for the management of her mood.  Patient was agreeable to recommendation.  Patient's medications were e-prescribed to pharmacy of choice.

## 2020-03-04 ENCOUNTER — Encounter (HOSPITAL_COMMUNITY): Payer: Self-pay | Admitting: Physician Assistant

## 2020-03-12 ENCOUNTER — Encounter (HOSPITAL_COMMUNITY): Payer: Self-pay | Admitting: Physician Assistant

## 2020-03-12 ENCOUNTER — Other Ambulatory Visit: Payer: Self-pay

## 2020-03-12 ENCOUNTER — Telehealth (INDEPENDENT_AMBULATORY_CARE_PROVIDER_SITE_OTHER): Payer: Medicaid Other | Admitting: Physician Assistant

## 2020-03-12 DIAGNOSIS — F3160 Bipolar disorder, current episode mixed, unspecified: Secondary | ICD-10-CM | POA: Diagnosis not present

## 2020-03-12 DIAGNOSIS — R632 Polyphagia: Secondary | ICD-10-CM | POA: Diagnosis not present

## 2020-03-12 DIAGNOSIS — G4700 Insomnia, unspecified: Secondary | ICD-10-CM

## 2020-03-12 MED ORDER — FLUOXETINE HCL 20 MG PO CAPS
60.0000 mg | ORAL_CAPSULE | Freq: Every day | ORAL | 1 refills | Status: DC
Start: 2020-03-12 — End: 2020-05-13

## 2020-03-12 MED ORDER — ARIPIPRAZOLE 15 MG PO TABS: 15.0000 mg | ORAL_TABLET | Freq: Every day | ORAL | 1 refills | Status: DC

## 2020-03-12 MED ORDER — TRAZODONE HCL 50 MG PO TABS
50.0000 mg | ORAL_TABLET | Freq: Every day | ORAL | 1 refills | Status: DC
Start: 2020-03-12 — End: 2020-05-13

## 2020-03-12 NOTE — Progress Notes (Signed)
BH MD/PA/NP OP Progress Note  Virtual Visit via Telephone Note  I connected with [name] on 03/12/20 at  2:00 PM EST by telephone and verified that I am speaking with the correct person using two identifiers.  Location: Patient: Home Provider: Clinic   I discussed the limitations, risks, security and privacy concerns of performing an evaluation and management service by telephone and the availability of in person appointments. I also discussed with the patient that there may be a patient responsible charge related to this service. The patient expressed understanding and agreed to proceed.  Follow Up Instructions:    I discussed the assessment and treatment plan with the patient. The patient was provided an opportunity to ask questions and all were answered. The patient agreed with the plan and demonstrated an understanding of the instructions.   The patient was advised to call back or seek an in-person evaluation if the symptoms worsen or if the condition fails to improve as anticipated.  I provided 20 minutes of non-face-to-face time during this encounter.   [name], PA   03/12/2020 2:13 PM [name]  MRN:  [id number]  Chief Complaint: Follow-up and medication management  HPI:   Katrina Stewart is a 25 year old female with a past psychiatric history significant for bipolar disorder, insomnia, and bulimia who presents to [facility] via virtual telephone visit for follow-up and medication management.  Patient is currently being managed on the following medications since the last encounter:  Trazodone 50 mg at bedtime Abilify 15 mg daily Prozac 60 mg daily  Patient reports that things have been going well with her current medication regimen.  Patient denies any adverse side effects from her current medication regimen.  Patient denies the need for dosage adjustments at this time. Patient endorses improved mood and  denies binging food or vomiting.  Patient has no new life events to report at this time.  Patient is pleasant, calm, cooperative, and fully engaged in conversation during the encounter.  Patient reports that her mood has been generally well and that her medications have been a contributing factor to her improved mood.  Patient denies suicidal ideations or self-injurious behavior.  Patient denies homicidal ideations.  Patient further denies auditory or visual hallucinations, however, patient states that she sometimes hears "random things."  Patient describes these random things as her thoughts speaking negatively about her.  Patient endorses good sleep and receives on average 6 hours of sleep each night.  Patient endorses fair appetite and states that she only eats roughly 1 meal per day.  Patient endorses alcohol consumption and mainly drinks on the weekends.  Patient denies tobacco use and illicit drug use.  Visit Diagnosis:    ICD-10-CM   1. Bipolar affective disorder, current episode mixed, current episode severity unspecified (HCC)  F31.60 ARIPiprazole (ABILIFY) 15 MG tablet    FLUoxetine (PROZAC) 20 MG capsule  2. Insomnia, unspecified type  G47.00 traZODone (DESYREL) 50 MG tablet    Past Psychiatric History:  Bipolar disorder Insomnia  Past Medical History:  Past Medical History:  Diagnosis Date  . Anxiety   . COVID-19 virus infection 12/05/2018   + again on 2/4 (asymptomatic)  postiive test in MAU on 11-2  . Depression   . Depression    Phreesia 12/17/2019  . Depression    Phreesia 12/30/2019  . Essential hypertension   . Gestational diabetes mellitus (GDM)    Normal postpartum 2 hr GTT  . Group B streptococcal infection  in pregnancy 03/13/2019  . Rh negative state in antepartum period 09/03/2015   [x]  needs rhogam at 28 weeks  [name] postive anti-D on initial lab. Patient received rhogam on 08/02/18 during MAU visit  . Rubella non-immune status, antepartum 09/03/2015  . Vaginal Pap  smear, abnormal     Past Surgical History:  Procedure Laterality Date  . BREAST SURGERY  2013   Lt & Rt excision juvenile fibroadenoma  . MASS EXCISION  10/29/2011   Procedure: EXCISION MASS;  Surgeon: Harl Bowie, MD;  Location: [facility];  Service: General;  Laterality: Bilateral;  excision of bilateral breast masses    Family Psychiatric History:  Patient unsure  Family History:  Family History  Problem Relation Age of Onset  . Cancer Maternal Grandmother        lung cancer  . Cancer Other        bladder cancer  . Asthma Brother     Social History:  Social History   Socioeconomic History  . Marital status: Single    Spouse name: Not on file  . Number of children: Not on file  . Years of education: Not on file  . Highest education level: Not on file  Occupational History    Comment: unemployed  Tobacco Use  . Smoking status: Never Smoker  . Smokeless tobacco: Never Used  Vaping Use  . Vaping Use: Never used  Substance and Sexual Activity  . Alcohol use: Not Currently    Alcohol/week: 2.0 - 3.0 standard drinks    Types: 2 - 3 Shots of liquor per week    Comment: every weekend before pregnancy (4-5 drinks)  . Drug use: No  . Sexual activity: Not Currently    Birth control/protection: None  Other Topics Concern  . Not on file  Social History Narrative  . Not on file   Social Determinants of Health   Financial Resource Strain: Not on file  Food Insecurity: Not on file  Transportation Needs: Not on file  Physical Activity: Not on file  Stress: Not on file  Social Connections: Not on file    Allergies: No Known Allergies  Metabolic Disorder Labs: Lab Results  Component Value Date   HGBA1C 5.7 (H) 12/31/2019   No results found for: PROLACTIN Lab Results  Component Value Date   CHOL 149 12/31/2019   TRIG 58 12/31/2019   HDL 40 12/31/2019   CHOLHDL 3.7 12/31/2019   VLDL 16 07/18/2009   LDLCALC 97 12/31/2019   [place] 90 07/18/2009   Lab  Results  Component Value Date   TSH 3.420 12/03/2014    Therapeutic Level Labs: No results found for: LITHIUM No results found for: VALPROATE No components found for:  CBMZ  Current Medications: Current Outpatient Medications  Medication Sig Dispense Refill  . ARIPiprazole (ABILIFY) 15 MG tablet Take 1 tablet (15 mg total) by mouth daily. 30 tablet 1  . FLUoxetine (PROZAC) 20 MG capsule Take 3 capsules (60 mg total) by mouth daily. 90 capsule 1  . traZODone (DESYREL) 50 MG tablet Take 1 tablet (50 mg total) by mouth at bedtime. 30 tablet 1   No current facility-administered medications for this visit.     Musculoskeletal: Strength & Muscle Tone: Unable to assess due to telemedicine visit [place]:  Unable to assess due to telemedicine visit Patient leans:  Unable to assess due to telemedicine visit  Psychiatric Specialty Exam: Review of Systems  Psychiatric/Behavioral: Negative for decreased concentration, dysphoric mood, hallucinations (Patient  denies active hallucinations but states that she experiences auditory hallucinations from time to time.), self-injury, sleep disturbance and suicidal ideas. The patient is not nervous/anxious and is not hyperactive.     currently breastfeeding.There is no height or weight on file to calculate BMI.  General Appearance:  Unable to assess due to telemedicine visit  Eye Contact:   Unable to assess due to telemedicine visit  Speech:  Clear and Coherent and Normal Rate  Volume:  Normal  Mood:  Euthymic  Affect:  Appropriate  Thought Process:  Coherent, Goal Directed and Descriptions of Associations: Intact  Orientation:  Full (Time, Place, and Person)  Thought Content: WDL and Logical   Suicidal Thoughts:  No  Homicidal Thoughts:  No  Memory:  Immediate;   Good Recent;   Good Remote;   Good  Judgement:  Good  Insight:  Fair  Psychomotor Activity:  Normal  Concentration:  Concentration: Good and Attention Span: Good  Recall:   Good  Fund of Knowledge: Good  Language: Good  Akathisia:  NA  Handed:  Right  AIMS (if indicated): not done  Assets:  Communication Skills Desire for Improvement Housing  ADL's:  Intact  Cognition: WNL  Sleep:  Good   Screenings: AIMS   Flowsheet Row Admission (Discharged) from 11/29/2014 in [place] [facility]  AIMS Total Score 0    AUDIT   Flowsheet Row Admission (Discharged) from 11/29/2014 in [place] [facility]  Alcohol Use Disorder Identification Test Final Score (AUDIT) 13    GAD-7   Flowsheet Row Telemedicine from 10/03/2019 in Primary Care at [place] from 03/19/2019 in [place] for [place] from 03/05/2019 in [place] for [facility] Routine Prenatal from 02/05/2019 in [place] for [facility] Postpartum Visit from 04/07/2016 in [place] for [facility]  Total GAD-7 Score 4 9 11 7 8     PHQ2-9   [place] from 10/03/2019 in Primary Care at [place] from 03/19/2019 in [place] for [place] from 03/05/2019 in [place] for [facility] Routine Prenatal from 02/05/2019 in [place] for [facility] Video Visit from 01/22/2019 in [place] for [facility]  PHQ-2 Total Score 4 6 5 4 4   PHQ-9 Total Score 13 16 14 11 9        Assessment and Plan:   Katrina Stewart is a 25 year old female with a past psychiatric history significant for bipolar disorder, insomnia, and bulimia who presents to [facility] via virtual telephone visit for follow-up and medication management.  Patient denies any issues or concerns with her current medication regimen. Patient denies any adverse side effects from her current medication regimen.  Patient denies the need for dosage adjustments at this time.  She reports that her binge eating and vomiting as well as her mood has been vastly improved since having her medications adjusted from the last encounter.  Patient is requesting refills on her medications.  Patient's medications will be e-prescribed to pharmacy of choice.  Patient was encouraged to increase her physical activity/work at least 30 minutes/day.  Patient was agreeable to recommendation.  1. Bipolar affective disorder, current episode mixed, current episode severity unspecified (HCC)  - ARIPiprazole (ABILIFY) 15 MG tablet; Take 1 tablet (15 mg total) by mouth daily.  Dispense: 30 tablet; Refill: 1 - FLUoxetine (PROZAC) 20 MG capsule; Take 3 capsules (60 mg total) by mouth daily.  Dispense: 90 capsule; Refill: 1  2. Insomnia, unspecified type  - traZODone (DESYREL) 50 MG tablet; Take 1 tablet (50 mg total) by mouth at bedtime.  Dispense: 30 tablet; Refill: 1  3. Binge eating  - FLUoxetine (PROZAC) 20 MG capsule; Take 3 capsules (60 mg total) by mouth daily.  Dispense: 90 capsule; Refill: 1  Patient to follow up in 6 weeks  [name], PA 03/12/2020, 2:13 PM

## 2020-04-23 ENCOUNTER — Other Ambulatory Visit: Payer: Self-pay

## 2020-04-23 ENCOUNTER — Telehealth (HOSPITAL_COMMUNITY): Payer: Medicaid Other | Admitting: Physician Assistant

## 2020-05-13 ENCOUNTER — Telehealth (HOSPITAL_COMMUNITY): Payer: Medicaid Other | Admitting: Family

## 2020-05-13 ENCOUNTER — Encounter (HOSPITAL_COMMUNITY): Payer: Self-pay | Admitting: Family

## 2020-05-13 ENCOUNTER — Other Ambulatory Visit: Payer: Self-pay

## 2020-05-13 ENCOUNTER — Telehealth (HOSPITAL_COMMUNITY): Payer: Medicaid Other | Admitting: Physician Assistant

## 2020-05-13 MED ORDER — ARIPIPRAZOLE 15 MG PO TABS: 15.0000 mg | ORAL_TABLET | Freq: Every day | ORAL | 1 refills | Status: DC

## 2020-05-13 MED ORDER — FLUOXETINE HCL 20 MG PO CAPS: 60.0000 mg | ORAL_CAPSULE | Freq: Every day | ORAL | 1 refills | Status: DC

## 2020-05-13 MED ORDER — TRAZODONE HCL 50 MG PO TABS: 50.0000 mg | ORAL_TABLET | Freq: Every day | ORAL | 1 refills | Status: DC

## 2020-05-13 NOTE — Progress Notes (Unsigned)
Virtual Visit via Video Note  I connected with Katrina Stewart on 05/13/20 at  2:45 PM EDT by a video enabled telemedicine application and verified that I am speaking with the correct person using two identifiers.  Location: Patient: Home Provider: Office   I discussed the limitations of evaluation and management by telemedicine and the availability of in person appointments. The patient expressed understanding and agreed to proceed.    I discussed the assessment and treatment plan with the patient. The patient was provided an opportunity to ask questions and all were answered. The patient agreed with the plan and demonstrated an understanding of the instructions.   The patient was advised to call back or seek an in-person evaluation if the symptoms worsen or if the condition fails to improve as anticipated.  I provided 15 minutes of non-face-to-face time during this encounter.   Derrill [place], NP   [facility] MD/PA/NP OP Progress Note  05/13/2020 2:58 PM Katrina Stewart  MRN:  [id number]   Katrina Stewart was evaluated through [place], she presents with a bright and pleasant affect.  Patient was very minimal guarded throughout this assessment as it appears that she is unable to discuss mood or feelings at this time due to family members being around.  Observed riding in a car.  Offered to reschedule however patient declined.    Katrina Stewart denied suicidal or homicidal ideations.  Denies auditory or visual hallucinations.  Patient responses were short and abrupt.  Denied any medication side effects.  Reported a good appetite.  States she is resting well throughout the night.  Chart review patient diagnosed with bipolar affective disorder, insomnia, depression and binge eating.  Patient is currently prescribed Abilify 15 mg daily, Prozac 20 mg daily and trazodone 50 mg p.o. nightly.  Will make medication refills available.  Patient to follow-up 4-6 weeks for medication management.  Support,  encouragement and  reassurance was provided.   Visit Diagnosis:    ICD-10-CM   1. Bipolar affective disorder, current episode mixed, current episode severity unspecified (HCC)  F31.60 ARIPiprazole (ABILIFY) 15 MG tablet    FLUoxetine (PROZAC) 20 MG capsule  2. Binge eating  R63.2 FLUoxetine (PROZAC) 20 MG capsule  3. Insomnia, unspecified type  G47.00 traZODone (DESYREL) 50 MG tablet    Past Psychiatric History:   Past Medical History:  Past Medical History:  Diagnosis Date  . Anxiety   . COVID-19 virus infection 12/05/2018   + again on 2/4 (asymptomatic)  postiive test in MAU on 11-2  . Depression   . Depression    Phreesia 12/17/2019  . Depression    Phreesia 12/30/2019  . Essential hypertension   . Gestational diabetes mellitus (GDM)    Normal postpartum 2 hr GTT  . Group B streptococcal infection in pregnancy 03/13/2019  . Rh negative state in antepartum period 09/03/2015   [x]  needs rhogam at 28 weeks  [name] postive anti-D on initial lab. Patient received rhogam on 08/02/18 during MAU visit  . Rubella non-immune status, antepartum 09/03/2015  . Vaginal Pap smear, abnormal     Past Surgical History:  Procedure Laterality Date  . BREAST SURGERY  2013   Lt & Rt excision juvenile fibroadenoma  . MASS EXCISION  10/29/2011   Procedure: EXCISION MASS;  Surgeon: Harl Bowie, MD;  Location: [facility];  Service: General;  Laterality: Bilateral;  excision of bilateral breast masses    Family Psychiatric History:  Family History:  Family History  Problem Relation Age of Onset  .  Cancer Maternal Grandmother        lung cancer  . Cancer Other        bladder cancer  . Asthma Brother     Social History:  Social History   Socioeconomic History  . Marital status: Single    Spouse name: Not on file  . Number of children: Not on file  . Years of education: Not on file  . Highest education level: Not on file  Occupational History    Comment: unemployed  Tobacco Use  .  Smoking status: Never Smoker  . Smokeless tobacco: Never Used  Vaping Use  . Vaping Use: Never used  Substance and Sexual Activity  . Alcohol use: Not Currently    Alcohol/week: 2.0 - 3.0 standard drinks    Types: 2 - 3 Shots of liquor per week    Comment: every weekend before pregnancy (4-5 drinks)  . Drug use: No  . Sexual activity: Not Currently    Birth control/protection: None  Other Topics Concern  . Not on file  Social History Narrative  . Not on file   Social Determinants of Health   Financial Resource Strain: Not on file  Food Insecurity: Not on file  Transportation Needs: Not on file  Physical Activity: Not on file  Stress: Not on file  Social Connections: Not on file    Allergies: No Known Allergies  Metabolic Disorder Labs: Lab Results  Component Value Date   HGBA1C 5.7 (H) 12/31/2019   No results found for: PROLACTIN Lab Results  Component Value Date   CHOL 149 12/31/2019   TRIG 58 12/31/2019   HDL 40 12/31/2019   CHOLHDL 3.7 12/31/2019   VLDL 16 07/18/2009   LDLCALC 97 12/31/2019   [place] 90 07/18/2009   Lab Results  Component Value Date   TSH 3.420 12/03/2014    Therapeutic Level Labs: No results found for: LITHIUM No results found for: VALPROATE No components found for:  CBMZ  Current Medications: Current Outpatient Medications  Medication Sig Dispense Refill  . ARIPiprazole (ABILIFY) 15 MG tablet Take 1 tablet (15 mg total) by mouth daily. 30 tablet 1  . FLUoxetine (PROZAC) 20 MG capsule Take 3 capsules (60 mg total) by mouth daily. 90 capsule 1  . traZODone (DESYREL) 50 MG tablet Take 1 tablet (50 mg total) by mouth at bedtime. 30 tablet 1   No current facility-administered medications for this visit.     Musculoskeletal: Strength & Muscle Tone: within normal limits Gait & Station: normal Patient leans: N/A  Psychiatric Specialty Exam: Review of Systems  currently breastfeeding.There is no height or weight on file to  calculate BMI.  General Appearance: Casual  Eye Contact:  Good  Speech:  Clear and Coherent  Volume:  Normal  Mood:  Anxious and Depressed  Affect:  Congruent  Thought Process:  Coherent  Orientation:  Full (Time, Place, and Person)  Thought Content: Hallucinations: None   Suicidal Thoughts:  No  Homicidal Thoughts:  No  Memory:  Immediate;   Fair Recent;   Fair  Judgement:  Good  Insight:  Good  Psychomotor Activity:  Normal  Concentration:  Concentration: Good  Recall:  Good  Fund of Knowledge: Good  Language: Fair  Akathisia:  No  Handed:  Right  AIMS (if indicated):   Assets:  Communication Skills Desire for Improvement Resilience Social Support  ADL's:  Intact  Cognition: WNL  Sleep:  Fair   Screenings: AIMS   Flowsheet Row Admission (Discharged)  from 11/29/2014 in [place] [facility]  AIMS Total Score 0    AUDIT   Flowsheet Row Admission (Discharged) from 11/29/2014 in [place] [facility]  Alcohol Use Disorder Identification Test Final Score (AUDIT) 13    GAD-7   Flowsheet Row Telemedicine from 10/03/2019 in Primary Care at [place] from 03/19/2019 in [place] for [place] from 03/05/2019 in [place] for [facility] Routine Prenatal from 02/05/2019 in [place] for [facility] Postpartum Visit from 04/07/2016 in [place] for [facility]  Total GAD-7 Score 4 9 11 7 8     PHQ2-9   [place] from 10/03/2019 in [place] at [place] from 03/19/2019 in [place] for [facility] from 03/05/2019 in [place] for [facility] Routine Prenatal from 02/05/2019 in [place] for [facility] Video Visit from 01/22/2019 in [place] for [facility]  PHQ-2 Total Score 4 6 5 4 4   PHQ-9 Total Score 13 16 14 11 9         Assessment and Plan:   Bipolar affective disorder:  Continue Abilify 15 mg p.o. daily Continue Prozac 60 mg p.o. daily  Insomnia: Continue trazodone 50 mg p.o. nightly as needed   Patient to follow up in 4-6 weeks  Derrill Center, NP 05/13/2020, 2:58 PM

## 2020-07-02 ENCOUNTER — Encounter: Payer: Self-pay | Admitting: Internal Medicine

## 2020-07-02 ENCOUNTER — Other Ambulatory Visit: Payer: Self-pay

## 2020-07-02 ENCOUNTER — Ambulatory Visit (INDEPENDENT_AMBULATORY_CARE_PROVIDER_SITE_OTHER): Payer: Medicaid Other | Admitting: Internal Medicine

## 2020-07-02 VITALS — BP 114/80 | HR 76 | Temp 98.0°F | Resp 16 | Wt 270.0 lb

## 2020-07-02 DIAGNOSIS — O10919 Unspecified pre-existing hypertension complicating pregnancy, unspecified trimester: Secondary | ICD-10-CM | POA: Diagnosis not present

## 2020-07-02 NOTE — Progress Notes (Signed)
  Subjective:    Katrina Stewart - 25 y.o. female MRN [id number]  Date of birth: Dec 17, 1995  HPI  Yajayra Barletta is here for BP follow up. Has a history of cHTN impacting pregnancy. Has never needed to take medications during pregnancy or between pregnancies. Has a cuff at home but does not monitor. No severe headaches, vision changes, chest pain.      Health Maintenance:  Health Maintenance Due  Topic Date Due  . HPV VACCINES (1 - 2-dose series) Never done  . COVID-19 Vaccine (3 - Booster for Pfizer series) 11/26/2019    -  reports that she has never smoked. She has never used smokeless tobacco. - Review of Systems: Per HPI. - Past Medical History: Patient Active Problem List   Diagnosis Date Noted  . History of gestational diabetes 10/03/2019  . Chronic hypertension during pregnancy 11/20/2018  . History of depression 08/29/2018  . Severe episode of recurrent major depressive disorder, without psychotic features ([place])   . Alcohol use disorder    - Medications: reviewed and updated   Objective:   Physical Exam BP 114/80 (BP Location: Right Arm, Patient Position: Sitting, Cuff Size: Large)   Pulse 76   Temp 98 F (36.7 C)   Resp 16   Wt 270 lb (122.5 kg)   LMP 06/19/2020   SpO2 (!) 65%   BMI 42.93 kg/m  Physical Exam Constitutional:      General: She is not in acute distress.    Appearance: She is not diaphoretic.  Cardiovascular:     Rate and Rhythm: Normal rate.  Pulmonary:     Effort: Pulmonary effort is normal. No respiratory distress.  Musculoskeletal:        General: Normal range of motion.  Skin:    General: Skin is warm and dry.  Neurological:     Mental Status: She is alert and oriented to person, place, and time.  Psychiatric:        Mood and Affect: Affect normal.        Judgment: Judgment normal.            Assessment & Plan:   1. Chronic hypertension during pregnancy BP is well controlled. Reviewed that recent BP trend has been  stable. Discussed monitoring at annual exams or instructed to take BP at home if symptomatic.    Phill Myron, D.O. 07/02/2020, 9:09 AM Primary Care at [facility]

## 2020-07-02 NOTE — Progress Notes (Signed)
F/u BP

## 2020-10-31 NOTE — Progress Notes (Signed)
Erroneous encounter

## 2020-11-04 ENCOUNTER — Encounter: Payer: Medicaid Other | Admitting: Family

## 2020-11-13 ENCOUNTER — Telehealth: Payer: Self-pay | Admitting: Internal Medicine

## 2020-11-13 NOTE — Telephone Encounter (Signed)
..   Medicaid Managed Care   Unsuccessful Outreach Note  11/13/2020 Name: Katrina Stewart MRN: [id number] DOB: 1995/09/22  Referred by: Nicolette Bang, MD Reason for referral : High Risk Managed Medicaid (I called the patient today to offer a phone visit with the [facility] Team. I left my contact information on her VM.)   An unsuccessful telephone outreach was attempted today. The patient was referred to the case management team for assistance with care management and care coordination.   Follow Up Plan: The care management team will reach out to the patient again over the next 7-14 days.   [place]

## 2020-11-13 NOTE — Telephone Encounter (Deleted)
m °

## 2020-11-26 ENCOUNTER — Telehealth: Payer: Self-pay | Admitting: Internal Medicine

## 2020-11-26 NOTE — Telephone Encounter (Signed)
..   Medicaid Managed Care   Unsuccessful Outreach Note  11/26/2020 Name: Katrina Stewart MRN: [id number] DOB: May 21, 1995  Referred by: Nicolette Bang, MD Reason for referral : High Risk Managed Medicaid (I called the patient today to get her scheduled for a phone visit with the [facility]. I left my name and number on her VM.)   A second unsuccessful telephone outreach was attempted today. The patient was referred to the case management team for assistance with care management and care coordination.   Follow Up Plan: The care management team will reach out to the patient again over the next 7-14 days.    [place], High Risk Medicaid [place]   .j

## 2020-12-09 ENCOUNTER — Telehealth: Payer: Self-pay | Admitting: Internal Medicine

## 2020-12-09 NOTE — Telephone Encounter (Signed)
..   Medicaid Managed Care   Unsuccessful Outreach Note  12/09/2020 Name: Katrina Stewart MRN: [id number] DOB: 13-Nov-1995  Referred by: Nicolette Bang, MD Reason for referral : High Risk Managed Medicaid (I called the patient today to offer the services of the [facility] Team. I left my name and number on her VM.)   Third unsuccessful telephone outreach was attempted today. The patient was referred to the case management team for assistance with care management and care coordination. The patient's primary care provider has been notified of our unsuccessful attempts to make or maintain contact with the patient. The care management team is pleased to engage with this patient at any time in the future should he/she be interested in assistance from the care management team.   Follow Up Plan: We have been unable to make contact with the patient for follow up. The care management team is available to follow up with the patient after provider conversation with the patient regarding recommendation for care management engagement and subsequent re-referral to the care management team.   [place], [place]

## 2021-02-05 IMAGING — US US MFM OB DETAIL+14 WK
1 series · 14 of 28 positions shown · non-contrast
Comparison: none

[Series 1: us mfm ob detail+14 wk · 68 acquisitions, 14 frames shown]
[im 3/68]
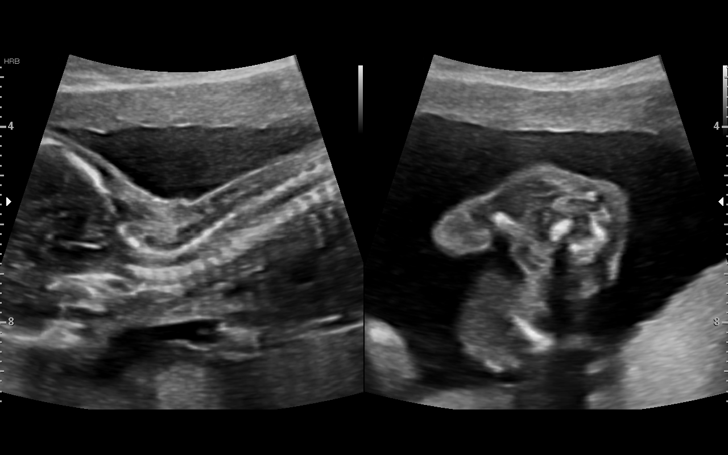
[im 8/68]
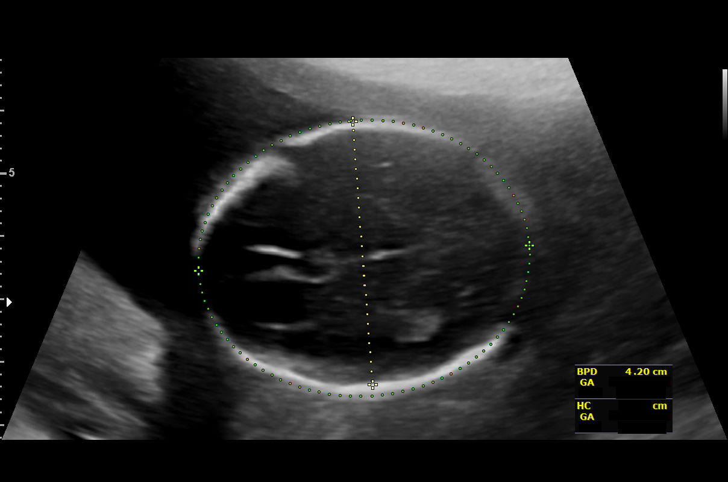
[im 13/68]
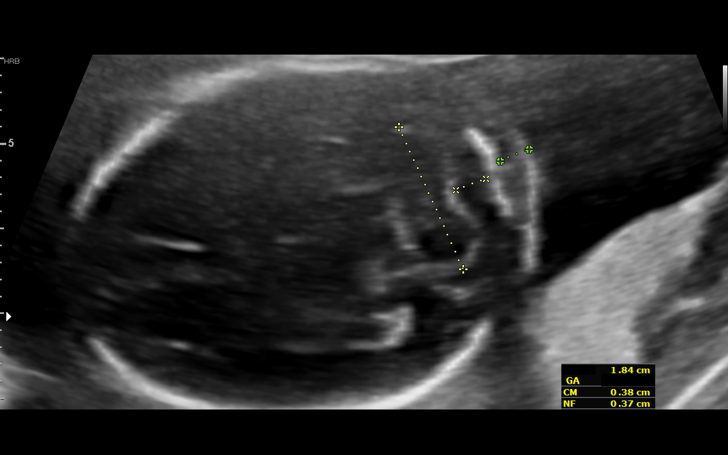
[im 18/68]
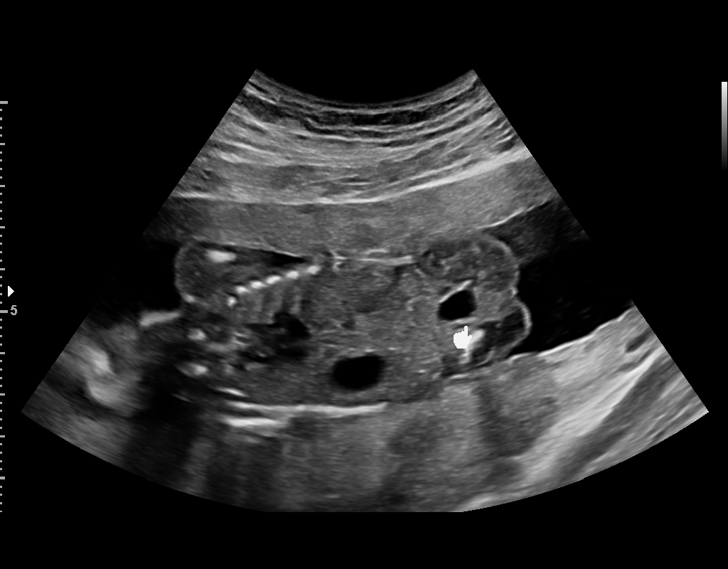
[im 23/68]
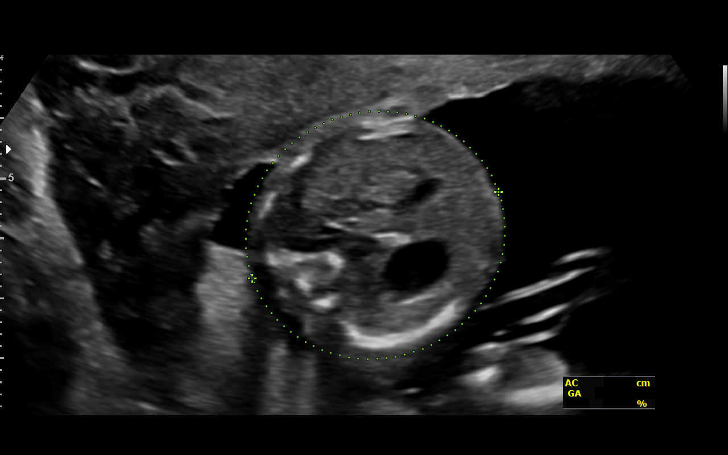
[im 28/68]
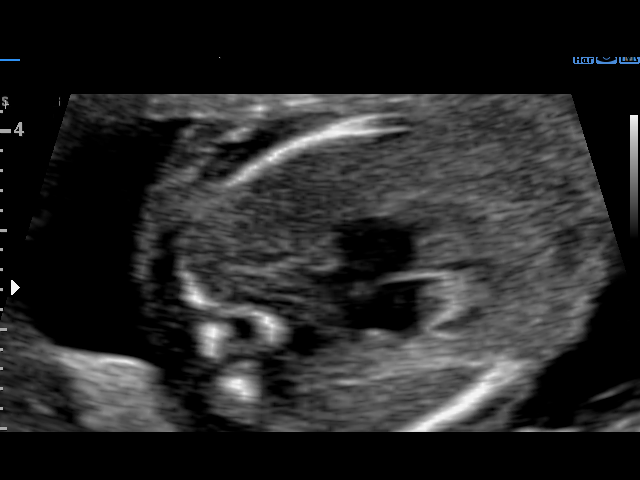
[im 33/68]
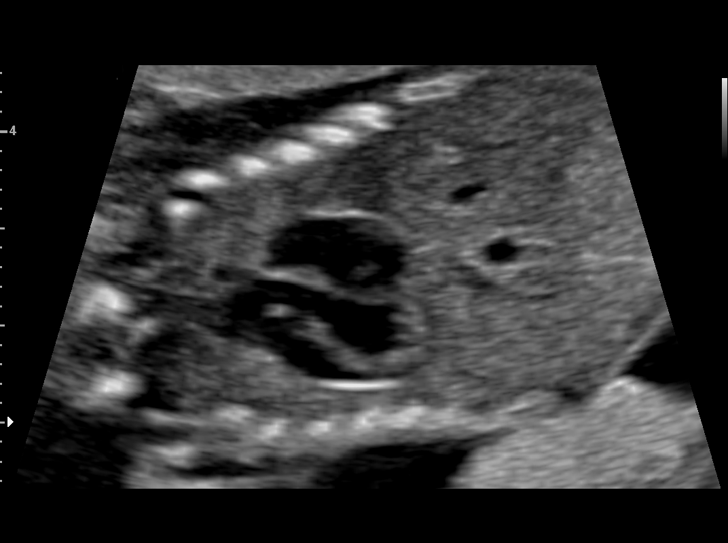
[im 38/68]
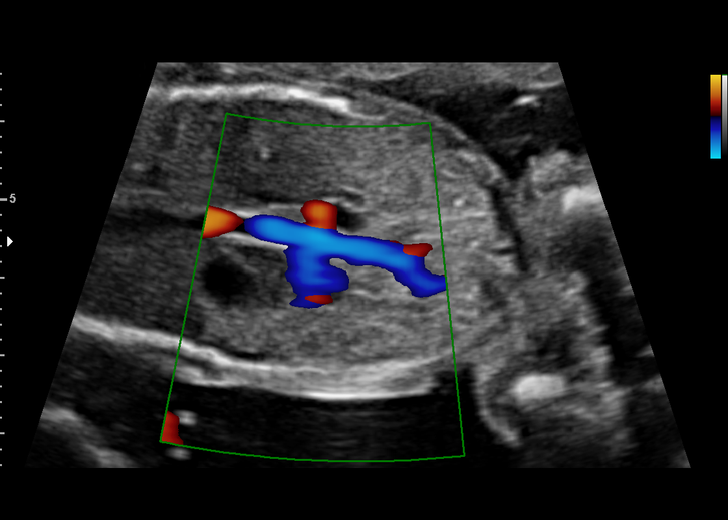
[im 43/68]
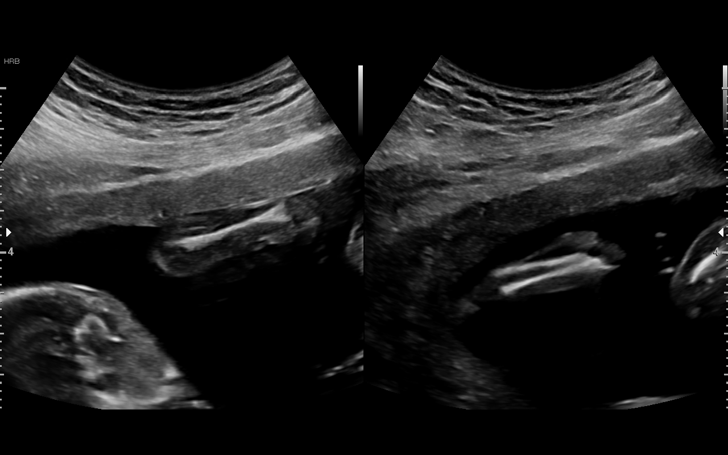
[im 48/68]
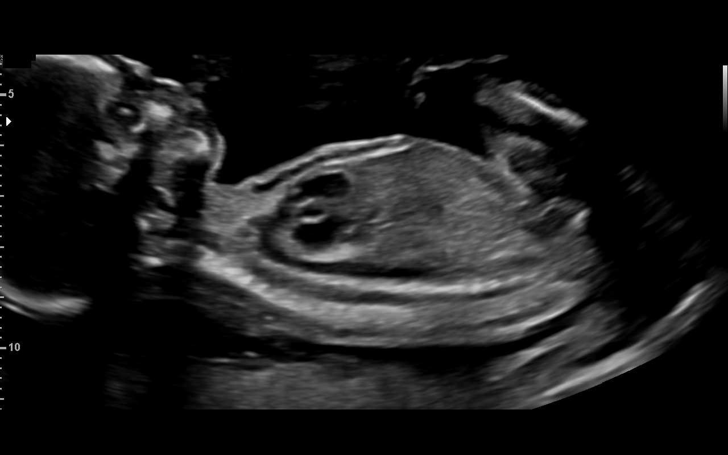
[im 53/68]
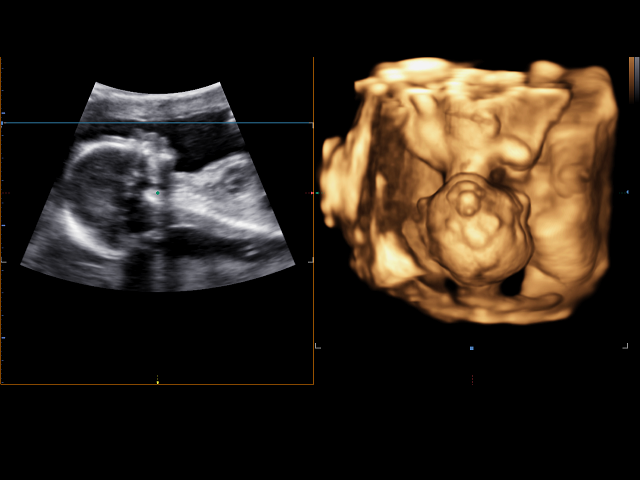
[im 58/68]
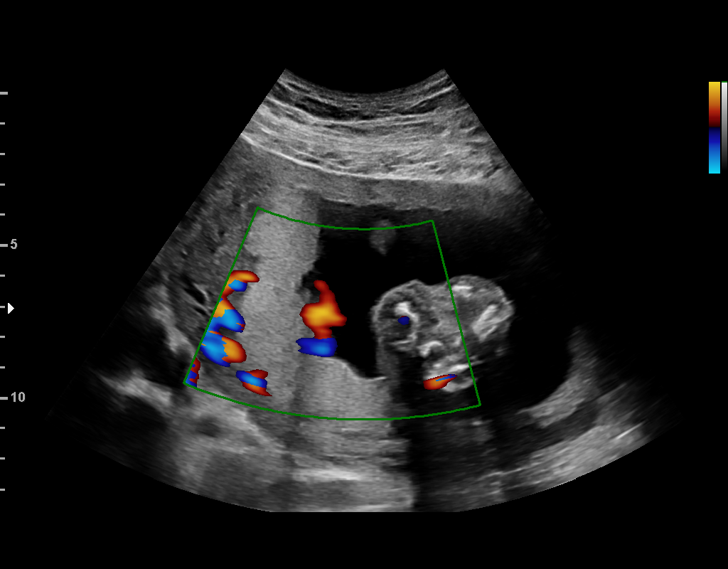
[im 63/68]
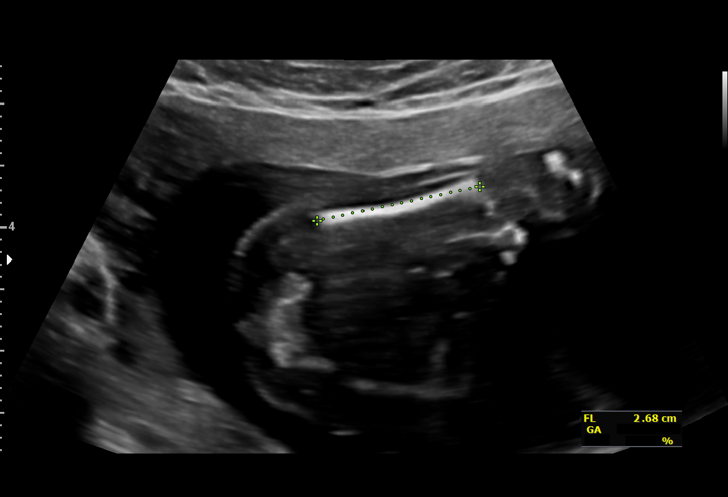
[im 68/68]
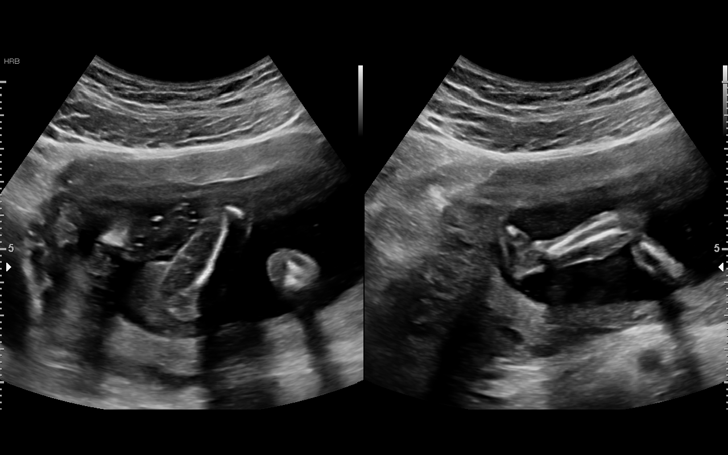

[14 of 28 positions shown; findings below may reference images not displayed]

----------------------------------------------------------------------

 ----------------------------------------------------------------------
Indications

  Encounter for antenatal screening for
  malformations (low risk NIPS)
  Rh negative state in antepartum
  Obesity complicating pregnancy, second
  trimester (BMI 41)
  Alcohol use complicating pregnancy, second
  trimester
  Genetic carrier (alpha-thalassemia)
  19 weeks gestation of pregnancy
 ----------------------------------------------------------------------
Vital Signs

 BMI:
Fetal Evaluation

 Num Of Fetuses:          1
 Fetal Heart Rate(bpm):   159
 Cardiac Activity:        Observed

 Amniotic Fluid
 AFI FV:      Within normal limits

                             Largest Pocket(cm)

Biometry

 BPD:      41.1  mm     G. Age:  18w 3d         27  %    CI:        75.41   %    70 - 86
                                                         FL/HC:       17.5  %    16.1 -
 HC:      150.1  mm     G. Age:  18w 1d          8  %    HC/AC:       1.11       1.09 -
 AC:      135.4  mm     G. Age:  19w 0d         46  %    FL/BPD:      64.0  %
 FL:       26.3  mm     G. Age:  18w 0d         12  %    FL/AC:       19.4  %    20 - 24
 CER:      18.4  mm     G. Age:  18w 1d         26  %
 NFT:       3.7  mm

 LV:        6.9  mm
 CM:        3.8  mm

 Est. FW:     242   gm     0 lb 9 oz     19  %
OB History

 Gravidity:    3         Term:   1         SAB:   1
 Living:       1
Gestational Age

 LMP:           19w 0d        Date:  06/23/18                 EDD:   03/30/19
 U/S Today:     18w 3d                                        EDD:   04/03/19
 Best:          19w 0d     Det. By:  LMP  (06/23/18)          EDD:   03/30/19
Anatomy

 Cranium:               Appears normal         Aortic Arch:            Appears normal
 Cavum:                 Appears normal         Ductal Arch:            Appears normal
 Ventricles:            Appears normal         Diaphragm:              Appears normal
 Choroid Plexus:        Appears normal         Stomach:                Appears normal, left
                                                                       sided
 Cerebellum:            Appears normal         Abdomen:                Appears normal
 Posterior Fossa:       Appears normal         Abdominal Wall:         Appears nml (cord
                                                                       insert, abd wall)
 Nuchal Fold:           Appears normal         Cord Vessels:           Appears normal (3
                                                                       vessel cord)
 Face:                  Appears normal         Kidneys:                Appear normal
                        (orbits and profile)
 Lips:                  Appears normal         Bladder:                Appears normal
 Thoracic:              Appears normal         Spine:                  Appears normal
 Heart:                 Appears normal         Upper Extremities:      Appears normal
                        (4CH, axis, and
                        situs)
 RVOT:                  Appears normal         Lower Extremities:      Appears normal
 LVOT:                  Appears normal

 Other:  Parents do not wish to know sex of fetus. Nasal bone visualized.
         Heels and 5th digit visualized.
Cervix Uterus Adnexa

 Cervix
 Length:           3.82  cm.
 Normal appearance by transabdominal scan.
Impression

 Normal interval growth.  No ultrasonic evidence of structural
 fetal anomalies.
 Good fetal movement and amniotic fluid
 Low risk NIPS
Recommendations

 Follow up growth scheduled in 4 weeks.

## 2021-04-29 ENCOUNTER — Ambulatory Visit: Payer: Self-pay

## 2021-04-29 NOTE — Telephone Encounter (Signed)
?  Chief Complaint: UTI ?Symptoms: abdominal cramps, urinary frequency, blood in urine ?Frequency: 2 days ?Pertinent Negatives: NA ?Disposition: '[]'$ ED /'[]'$ Urgent Care (no appt availability in office) / '[]'$ Appointment(In office/virtual)/ '[]'$  [facility]/ '[]'$ Home Care/ '[]'$ Refused Recommended Disposition /'[]'$ [facility]/ '[x]'$  Follow-up with PCP ?Additional Notes: Pt hadn't called PCP to get an appt. I advised her to contact PCP first and if unable to get an appt could call back and assistance setting up [facility] appt or directed her how to self schedule appt. Pt verbalized understanding.  ?Summary: Blood in urine, cramps  ? Pt called and reported blood in her urine for 2 days and cramp pains, advised to send high priority clinical call while nurse rebooted system. Advised she will return the patient's call pronto.   ?  ? ? ?Reason for Disposition ? Urinating more frequently than usual (i.e., frequency) ? ?Answer Assessment - Initial Assessment Questions ?1. SYMPTOM: "What's the main symptom you're concerned about?" (e.g., frequency, incontinence) ?    Cramps, frequency, blood in urine  ?2. ONSET: "When did the  sx  start?" ?    2 days ?3. PAIN: "Is there any pain?" If Yes, ask: "How bad is it?" (Scale: 1-10; mild, moderate, severe) ?    6/7 ?5. OTHER SYMPTOMS: "Do you have any other symptoms?" (e.g., fever, flank pain, blood in urine, pain with urination) ?    Blood in urine ? ?Protocols used: Urinary Symptoms-A-AH ? ?

## 2021-04-30 ENCOUNTER — Encounter: Payer: Self-pay | Admitting: Physician Assistant

## 2021-04-30 ENCOUNTER — Ambulatory Visit (INDEPENDENT_AMBULATORY_CARE_PROVIDER_SITE_OTHER): Payer: Medicaid Other | Admitting: Physician Assistant

## 2021-04-30 ENCOUNTER — Other Ambulatory Visit (HOSPITAL_COMMUNITY)
Admission: RE | Admit: 2021-04-30 | Discharge: 2021-04-30 | Disposition: A | Discharge status: Home / Self Care | Payer: Medicaid Other | Source: Ambulatory Visit | Attending: Physician Assistant | Admitting: Physician Assistant

## 2021-04-30 VITALS — BP 134/78 | HR 90 | Temp 98.7°F | Resp 18 | Ht 68.0 in | Wt 291.0 lb

## 2021-04-30 DIAGNOSIS — R319 Hematuria, unspecified: Secondary | ICD-10-CM | POA: Insufficient documentation

## 2021-04-30 DIAGNOSIS — F332 Major depressive disorder, recurrent severe without psychotic features: Secondary | ICD-10-CM | POA: Diagnosis not present

## 2021-04-30 DIAGNOSIS — B9689 Other specified bacterial agents as the cause of diseases classified elsewhere: Secondary | ICD-10-CM | POA: Diagnosis not present

## 2021-04-30 DIAGNOSIS — N939 Abnormal uterine and vaginal bleeding, unspecified: Secondary | ICD-10-CM | POA: Diagnosis not present

## 2021-04-30 DIAGNOSIS — N760 Acute vaginitis: Secondary | ICD-10-CM

## 2021-04-30 LAB — POCT URINALYSIS DIP (CLINITEK)
Bilirubin, UA: NEGATIVE
Glucose, UA: NEGATIVE mg/dL
Ketones, POC UA: NEGATIVE mg/dL
Leukocytes, UA: NEGATIVE
Nitrite, UA: NEGATIVE
POC PROTEIN,UA: 30 — AB
Spec Grav, UA: >=1.03 — AB (ref 1.010–1.025)
Urobilinogen, UA: 0.2 E.U./dL
pH, UA: 6 (ref 5.0–8.0)

## 2021-04-30 NOTE — Progress Notes (Signed)
Patient has not eaten today. ?Patient has taken Abilify today. ?Patient denies pain at this time. ?Patient reports bleeding beginning Monday as a light flow and now heavy. Patient denies pain at this time. ?

## 2021-04-30 NOTE — Patient Instructions (Signed)
I do encourage you to increase your water intake, you can use Tylenol as needed to help you with the cramping. ? ?Please return on Tuesday morning for further review, please be prepared to have fasting labs completed that morning. ? ?Kennieth Rad, PA-C ?Physician Assistant ?[facility] ?[email or web address] ? ? ?Abnormal Uterine Bleeding ?Abnormal uterine bleeding is unusual bleeding from the uterus. It includes bleeding after sex, or bleeding or spotting between menstrual periods. It may also include bleeding that is heavier than normal, menstrual periods that last longer than usual, or bleeding that occurs after menopause. ?Abnormal uterine bleeding can affect teenagers, women in their reproductive years, pregnant women, and women who have reached menopause. Common causes of abnormal uterine bleeding include: ?Pregnancy. ?Abnormal growths within the lining of the uterus (polyps). ?Benign tumors or growths in the uterus (fibroids). These are not cancer. ?Infection. ?Cancer. ?Too much or too little of some hormones in the body (hormonal imbalances). ?Any type of abnormal bleeding should be checked by a health care provider. Many cases are minor and simple to treat, but others may be more serious. Treatment will depend on the cause of the bleeding and how severe it is. ?Follow these instructions at home: ?Medicines ?Take over-the-counter and prescription medicines only as told by your health care provider. ?Ask your health care provider about: ?Taking medicines such as aspirin and ibuprofen. These medicines can thin your blood. Do not take these medicines unless your health care provider tells you to take them. ?Taking over-the-counter medicines, vitamins, herbs, and supplements. ?If you were prescribed iron pills, take them as told by your health care provider. Iron pills help to replace iron that your body loses because of this condition. ?Managing  constipation ?In cases of severe bleeding, you may be asked to increase your iron intake to treat anemia. Doing this may cause constipation. To prevent or treat constipation, you may need to: ?Drink enough fluid to keep your urine pale yellow. ?Take over-the-counter or prescription medicines. ?Eat foods that are high in fiber, such as beans, whole grains, and fresh fruits and vegetables. ?Limit foods that are high in fat and processed sugars, such as fried or sweet foods. ?Activity ?Alter your activity to decrease bleeding if you need to change your sanitary pad more than one time every 2 hours: ?Lie in bed with your feet raised (elevated). ?Place a cold pack on your lower abdomen. ?Rest as much as possible until the bleeding stops or slows down. ?General instructions ?Do not use tampons, douche, or have sex until your health care provider says these things are okay. ?Change your sanitary pads often. ?Get regular exams. These include pelvic exams and cervical cancer screenings. ?It is up to you to get the results of any tests that are done. Ask your health care provider, or the department that is doing the tests, when your results will be ready. ?Monitor your condition for any changes. For 2 months, write down: ?When your menstrual period starts. ?When your menstrual period ends. ?When any abnormal vaginal bleeding occurs. ?What problems you notice. ?Keep all follow-up visits. This is important. ?Contact a health care provider if: ?You have bleeding that lasts for more than one week. ?You feel dizzy at times. ?You feel nauseous or you vomit. ?You feel light-headed or weak. ?You notice any other changes that show that your condition is getting worse. ?Get help right away if: ?You faint. ?You have bleeding that soaks through a sanitary pad every hour. ?You have pain in  the abdomen. ?You have a fever or chills. ?You become sweaty or weak. ?You pass large blood clots from your vagina. ?These symptoms may represent a  serious problem that is an emergency. Do not wait to see if the symptoms will go away. Get medical help right away. Call your local emergency services (911 in the [place] Do not drive yourself to the hospital. ?Summary ?Abnormal uterine bleeding is unusual bleeding from the uterus. ?Any type of abnormal bleeding should be checked by a health care provider. Many cases are minor and simple to treat, but others may be more serious. ?Treatment will depend on the cause of the bleeding and how severe it is. ?Get help right away if you faint, you have bleeding that soaks through a sanitary pad every hour, or you pass large blood clots from your vagina. ?This information is not intended to replace advice given to you by your health care provider. Make sure you discuss any questions you have with your health care provider. ?Document Revised: 05/20/2020 Document Reviewed: 05/20/2020 ?Elsevier Patient Education ? [place]. ? ?Suicidal Feelings: How to Help Yourself ?Suicide is when you end your own life. Suicidal ideation includes expressing thoughts about, or a preoccupation with, ending your own life. There are many things you can do to help yourself feel better when struggling with these feelings. Many services and people are available to support you and others who struggle with similar feelings. ?If you ever feel like you may hurt yourself or others, or have thoughts about taking your own life, get help right away. To get help: ?Go to your nearest emergency department. ?Call your local emergency services (911 in the [place] ?Call the [employer] health and human services helpline (211 in the [place] ?Call or text a suicide hotline to speak with a trained counselor. The following suicide hotlines are available in the [place] States: ?1-800-273-TALK (1-762 135 4009 or 988 in the [place] ?1-800-SUICIDE (316) 201-8840). ?Text 862-719-4681. This is the Crisis Text Line in the [place]. ?323 553 0257. This is a hotline for Spanish  speakers. ?4306392659. This is a hotline for TTY users. ?1-866-4-U-TREVOR 630-272-1160). This is a hotline for lesbian, gay, bisexual, transgender, or questioning youth. ?For a list of hotlines in [place], visit [employer]nz.html ?Contact a crisis center or a local suicide prevention center. To find a crisis center or suicide prevention center: ?Call your local hospital, clinic, community service organization, mental health center, social service provider, or health department. Ask for help with connecting to a crisis center. ?For a list of crisis centers in the [place], visit: suicidepreventionlifeline.org ?For a list of crisis centers in [place], visit: suicideprevention.ca ?How to help yourself feel better ? ?Promise yourself that you will not do anything bad or extreme when you have suicidal feelings. Remember the times you have felt hopeful. ?Many people have gotten through suicidal thoughts and feelings, and you can too. ?If you have had these feelings before, remind yourself that you can get through them again. ?Let family, friends, teachers, or counselors know how you are feeling. Do not separate yourself from those who care about you and want to help you. ?Talk with someone every day, even if you do not feel like talking to anyone or being with other people. ?Face-to-face conversation is best to help them understand your feelings. ?Contact a mental health care provider and work with this person regularly. ?Make a safety plan that you can follow during a crisis. ?Include phone numbers of suicide prevention hotlines, mental health professionals, and trusted  friends and family members you can call during an emergency. ?Save these numbers on your phone. ?If you are thinking of taking a lot of medicine, give your medicine to someone who can give it to you as prescribed. ?If you are on antidepressants and are concerned you will overdose, tell your health  care provider so that he or she can give you safer medicines. ?Try to stick to your routines and follow a schedule every day. Make self-care a priority. ?Make a list of realistic goals, and cross them off when you ac

## 2021-04-30 NOTE — Progress Notes (Signed)
? ?Established Patient Office Visit ? ?Subjective:  ?Patient ID: Katrina Stewart, female    DOB: 02-23-1995  Age: 26 y.o. MRN: [id number] ? ?CC:  ?Chief Complaint  ?Patient presents with  ? Hematuria  ? ? ?HPI ?Katrina Stewart states that she had her normal menses March 10 through the 16th, states that then on Monday she started having a light flow, states that she has been using tampon and pads with blood on them.  States that she did notice her flow becoming much heavier in the last 2 days.  States that she has been having suprapubic cramping which she describes as "normal menses cramping".  Has not tried anything for relief. ? ?Denies discharge.  Does endorse sexual intercourse on Sunday the day prior to onset. ?Does not use birth control ? ?Does endorse that she has been having thoughts that she would be better off dead, adamantly denies any thoughts of self-harm.  States that she used to go to counseling but has been without a Social worker for "some time now".  States that she does not have anyone who prescribes her psychiatric medications, states that she has enough left to last her through next week. ? ?Past Medical History:  ?Diagnosis Date  ? Anxiety   ? COVID-19 virus infection 12/05/2018  ? + again on 2/4 (asymptomatic)  postiive test in [facility] on 11-2  ? Depression   ? Depression   ? Phreesia 12/17/2019  ? Depression   ? Phreesia 12/30/2019  ? Essential hypertension   ? Gestational diabetes mellitus (GDM)   ? Normal postpartum 2 hr GTT  ? Group B streptococcal infection in pregnancy 03/13/2019  ? Rh negative state in antepartum period 09/03/2015  ? '[x]'$  needs rhogam at 28 weeks  [name] postive anti-D on initial lab. Patient received rhogam on 08/02/18 during [facility] visit  ? Rubella non-immune status, antepartum 09/03/2015  ? Vaginal Pap smear, abnormal   ? ? ?Past Surgical History:  ?Procedure Laterality Date  ? BREAST SURGERY  2013  ? Lt & Rt excision juvenile fibroadenoma  ? MASS EXCISION  10/29/2011  ? Procedure:  EXCISION MASS;  Surgeon: Harl Bowie, MD;  Location: [facility];  Service: General;  Laterality: Bilateral;  excision of bilateral breast masses  ? ? ?Family History  ?Problem Relation Age of Onset  ? Cancer Maternal Grandmother   ?     lung cancer  ? Cancer Other   ?     bladder cancer  ? Asthma Brother   ? ? ?Social History  ? ?Socioeconomic History  ? Marital status: Single  ?  Spouse name: Not on file  ? Number of children: Not on file  ? Years of education: Not on file  ? Highest education level: Not on file  ?Occupational History  ?  Comment: unemployed  ?Tobacco Use  ? Smoking status: Never  ? Smokeless tobacco: Never  ?Vaping Use  ? Vaping Use: Never used  ?Substance and Sexual Activity  ? Alcohol use: Not Currently  ?  Alcohol/week: 2.0 - 3.0 standard drinks  ?  Types: 2 - 3 Shots of liquor per week  ?  Comment: every weekend before pregnancy (4-5 drinks)  ? Drug use: No  ? Sexual activity: Not Currently  ?  Birth control/protection: None  ?Other Topics Concern  ? Not on file  ?Social History Narrative  ? Not on file  ? ?Social Determinants of Health  ? ?Financial Resource Strain: Not on file  ?Food Insecurity: Not  on file  ?Transportation Needs: Not on file  ?Physical Activity: Not on file  ?Stress: Not on file  ?Social Connections: Not on file  ?Intimate Partner Violence: Not on file  ? ? ?Outpatient Medications Prior to Visit  ?Medication Sig Dispense Refill  ? ARIPiprazole (ABILIFY) 15 MG tablet Take 1 tablet (15 mg total) by mouth daily. 30 tablet 1  ? FLUoxetine (PROZAC) 20 MG capsule Take 3 capsules (60 mg total) by mouth daily. 90 capsule 1  ? traZODone (DESYREL) 50 MG tablet Take 1 tablet (50 mg total) by mouth at bedtime. 30 tablet 1  ? ?No facility-administered medications prior to visit.  ? ? ?No Known Allergies ? ?ROS ?Review of Systems  ?Constitutional:  Negative for chills and fever.  ?HENT: Negative.    ?Eyes: Negative.   ?Respiratory:  Negative for shortness of breath.   ?Cardiovascular:   Negative for chest pain.  ?Gastrointestinal:  Positive for abdominal pain. Negative for diarrhea, nausea and vomiting.  ?Endocrine: Negative.   ?Genitourinary:  Positive for hematuria, menstrual problem and vaginal bleeding. Negative for dysuria, genital sores, urgency and vaginal discharge.  ?Musculoskeletal:  Negative for back pain.  ?Allergic/Immunologic: Negative.   ?Neurological: Negative.   ?Hematological: Negative.   ?Psychiatric/Behavioral:  Positive for dysphoric mood. Negative for self-injury, sleep disturbance and suicidal ideas. The patient is nervous/anxious.   ? ?  ?Objective:  ?  ?Physical Exam ?Vitals and nursing note reviewed.  ?Constitutional:   ?   Appearance: Normal appearance. She is obese.  ?HENT:  ?   Head: Normocephalic and atraumatic.  ?   Right Ear: External ear normal.  ?   Left Ear: External ear normal.  ?   Nose: Nose normal.  ?   Mouth/Throat:  ?   Mouth: Mucous membranes are moist.  ?   Pharynx: Oropharynx is clear.  ?Eyes:  ?   Extraocular Movements: Extraocular movements intact.  ?   Conjunctiva/sclera: Conjunctivae normal.  ?   Pupils: Pupils are equal, round, and reactive to light.  ?Cardiovascular:  ?   Rate and Rhythm: Normal rate and regular rhythm.  ?   Pulses: Normal pulses.  ?   Heart sounds: Normal heart sounds.  ?Pulmonary:  ?   Effort: Pulmonary effort is normal.  ?   Breath sounds: Normal breath sounds.  ?Abdominal:  ?   General: Abdomen is flat.  ?   Palpations: Abdomen is soft.  ?   Tenderness: There is no abdominal tenderness. There is no right CVA tenderness or left CVA tenderness.  ?Musculoskeletal:     ?   General: Normal range of motion.  ?   Cervical back: Normal range of motion and neck supple.  ?Skin: ?   General: Skin is warm and dry.  ?Neurological:  ?   General: No focal deficit present.  ?   Mental Status: She is alert and oriented to person, place, and time.  ?Psychiatric:     ?   Mood and Affect: Mood normal.     ?   Behavior: Behavior normal.     ?    Thought Content: Thought content normal.     ?   Judgment: Judgment normal.  ? ? ?BP 134/78 (BP Location: Left Arm, Patient Position: Sitting, Cuff Size: Normal)   Pulse 90   Temp 98.7 ?F (37.1 ?C) (Oral)   Resp 18   Ht '5\' 8"'$  (1.727 m)   Wt 291 lb (132 kg)   LMP 04/10/2021   SpO2  98%   BMI 44.25 kg/m?  ?Wt Readings from Last 3 Encounters:  ?04/30/21 291 lb (132 kg)  ?07/02/20 270 lb (122.5 kg)  ?12/31/19 262 lb (118.8 kg)  ? ? ? ?Health Maintenance Due  ?Topic Date Due  ? COVID-19 Vaccine (3 - Booster for Pfizer series) 08/21/2019  ? INFLUENZA VACCINE  09/01/2020  ? ? ?There are no preventive care reminders to display for this patient. ? ?Lab Results  ?Component Value Date  ? TSH 3.420 12/03/2014  ? ?Lab Results  ?Component Value Date  ? WBC 7.6 12/31/2019  ? HGB 12.3 12/31/2019  ? HCT 39.5 12/31/2019  ? MCV 73 (L) 12/31/2019  ? PLT 363 12/31/2019  ? ?Lab Results  ?Component Value Date  ? NA 139 12/31/2019  ? K 4.0 12/31/2019  ? CO2 20 12/31/2019  ? GLUCOSE 85 12/31/2019  ? BUN 9 12/31/2019  ? CREATININE 0.81 12/31/2019  ? BILITOT <0.2 12/31/2019  ? ALKPHOS 67 12/31/2019  ? AST 11 12/31/2019  ? ALT 14 12/31/2019  ? PROT 7.2 12/31/2019  ? ALBUMIN 4.7 12/31/2019  ? CALCIUM 9.6 12/31/2019  ? ANIONGAP 11 03/23/2019  ? ?Lab Results  ?Component Value Date  ? CHOL 149 12/31/2019  ? ?Lab Results  ?Component Value Date  ? HDL 40 12/31/2019  ? ?Lab Results  ?Component Value Date  ? [place] 97 12/31/2019  ? ?Lab Results  ?Component Value Date  ? TRIG 58 12/31/2019  ? ?Lab Results  ?Component Value Date  ? CHOLHDL 3.7 12/31/2019  ? ?Lab Results  ?Component Value Date  ? HGBA1C 5.7 (H) 12/31/2019  ? ? ?  ?Assessment & Plan:  ? ?Problem List Items Addressed This Visit   ? ?  ? Other  ? Severe episode of recurrent major depressive disorder, without psychotic features ([place])  ? ?Other Visit Diagnoses   ? ? Hematuria, unspecified type    -  Primary  ? Relevant Orders  ? POCT URINALYSIS DIP (CLINITEK) (Completed)  ?  Cervicovaginal ancillary only  ? Abnormal uterine bleeding      ? Relevant Orders  ? hCG, serum, qualitative (Completed)  ? ?  ? ? ?No orders of the defined types were placed in this encounter. ?1. Hematuria, unspecified type

## 2021-05-01 LAB — HCG, SERUM, QUALITATIVE: hCG,Beta Subunit,Qual,Serum: NEGATIVE m[IU]/mL (ref <6)

## 2021-05-04 LAB — CERVICOVAGINAL ANCILLARY ONLY
Bacterial Vaginitis (gardnerella): POSITIVE — AB
Candida Glabrata: NEGATIVE
Candida Vaginitis: NEGATIVE
Chlamydia: NEGATIVE
Comment: NEGATIVE
Comment: NEGATIVE
Comment: NEGATIVE
Comment: NEGATIVE
Comment: NEGATIVE
Comment: NORMAL
Neisseria Gonorrhea: NEGATIVE
Trichomonas: NEGATIVE

## 2021-05-04 MED ORDER — METRONIDAZOLE 500 MG PO TABS: 500.0000 mg | ORAL_TABLET | Freq: Two times a day (BID) | ORAL | 0 refills | Status: AC

## 2021-05-04 NOTE — Addendum Note (Signed)
Addended by: Kennieth Rad on: 05/04/2021 11:49 AM ? ? Modules accepted: Orders ? ?

## 2021-05-05 ENCOUNTER — Encounter: Payer: Self-pay | Admitting: Physician Assistant

## 2021-05-05 ENCOUNTER — Ambulatory Visit (INDEPENDENT_AMBULATORY_CARE_PROVIDER_SITE_OTHER): Payer: Medicaid Other | Admitting: Physician Assistant

## 2021-05-05 VITALS — BP 124/83 | HR 78 | Temp 98.2°F | Resp 18 | Ht 68.0 in | Wt 296.0 lb

## 2021-05-05 DIAGNOSIS — Z6841 Body Mass Index (BMI) 40.0 and over, adult: Secondary | ICD-10-CM

## 2021-05-05 DIAGNOSIS — E66813 Obesity, class 3: Secondary | ICD-10-CM

## 2021-05-05 DIAGNOSIS — E559 Vitamin D deficiency, unspecified: Secondary | ICD-10-CM

## 2021-05-05 DIAGNOSIS — Z13220 Encounter for screening for lipoid disorders: Secondary | ICD-10-CM

## 2021-05-05 DIAGNOSIS — F3160 Bipolar disorder, current episode mixed, unspecified: Secondary | ICD-10-CM

## 2021-05-05 DIAGNOSIS — F332 Major depressive disorder, recurrent severe without psychotic features: Secondary | ICD-10-CM | POA: Diagnosis not present

## 2021-05-05 DIAGNOSIS — F5104 Psychophysiologic insomnia: Secondary | ICD-10-CM

## 2021-05-05 DIAGNOSIS — R7303 Prediabetes: Secondary | ICD-10-CM | POA: Diagnosis not present

## 2021-05-05 HISTORY — DX: Obesity, class 3: E66.813

## 2021-05-05 HISTORY — DX: Prediabetes: R73.03

## 2021-05-05 HISTORY — DX: Bipolar disorder, current episode mixed, unspecified: F31.60

## 2021-05-05 HISTORY — DX: Psychophysiologic insomnia: F51.04

## 2021-05-05 HISTORY — DX: Body Mass Index (BMI) 40.0 and over, adult: Z684

## 2021-05-05 LAB — POCT GLYCOSYLATED HEMOGLOBIN (HGB A1C): Hemoglobin A1C: 5.8 % — AB (ref 4.0–5.6)

## 2021-05-05 MED ORDER — ARIPIPRAZOLE 15 MG PO TABS: 15.0000 mg | ORAL_TABLET | Freq: Every day | ORAL | 1 refills | Status: DC

## 2021-05-05 MED ORDER — METFORMIN HCL ER 500 MG PO TB24: 500.0000 mg | ORAL_TABLET | Freq: Every day | ORAL | 1 refills | Status: DC

## 2021-05-05 MED ORDER — FLUOXETINE HCL 20 MG PO CAPS: 60.0000 mg | ORAL_CAPSULE | Freq: Every day | ORAL | 1 refills | Status: DC

## 2021-05-05 MED ORDER — TRAZODONE HCL 50 MG PO TABS: 50.0000 mg | ORAL_TABLET | Freq: Every day | ORAL | 1 refills | Status: DC

## 2021-05-05 NOTE — Patient Instructions (Addendum)
You are going to continue taking the Abilify, Prozac on a daily basis.  I strongly encourage you to work on improving your sleep, it is okay to use the trazodone on a daily basis, you should be sleeping 7 to 8 hours a night. ? ?I encourage you to make sure that you are drinking 5 bottles of water a day, work on making sure that you are eating enough on a daily basis, being mindful of any added sugars. ? ?We will call you with today's lab results, you will follow-up with the mobile team in 2 weeks, we will call you and let you know where we will be located. ? ?Kennieth Rad, PA-C ?Physician Assistant ?[facility] ?[email or web address] ? ?Insomnia ?Insomnia is a sleep disorder that makes it difficult to fall asleep or stay asleep. Insomnia can cause fatigue, low energy, difficulty concentrating, mood swings, and poor performance at work or school. ?There are three different ways to classify insomnia: ?Difficulty falling asleep. ?Difficulty staying asleep. ?Waking up too early in the morning. ?Any type of insomnia can be long-term (chronic) or short-term (acute). Both are common. Short-term insomnia usually lasts for three months or less. Chronic insomnia occurs at least three times a week for longer than three months. ?What are the causes? ?Insomnia may be caused by another condition, situation, or substance, such as: ?Anxiety. ?Certain medicines. ?Gastroesophageal reflux disease (GERD) or other gastrointestinal conditions. ?Asthma or other breathing conditions. ?Restless legs syndrome, sleep apnea, or other sleep disorders. ?Chronic pain. ?Menopause. ?Stroke. ?Abuse of alcohol, tobacco, or illegal drugs. ?Mental health conditions, such as depression. ?Caffeine. ?Neurological disorders, such as Alzheimer's disease. ?An overactive thyroid (hyperthyroidism). ?Sometimes, the cause of insomnia may not be known. ?What increases the risk? ?Risk factors for insomnia  include: ?Gender. Women are affected more often than men. ?Age. Insomnia is more common as you get older. ?Stress. ?Lack of exercise. ?Irregular work schedule or working night shifts. ?Traveling between different time zones. ?Certain medical and mental health conditions. ?What are the signs or symptoms? ?If you have insomnia, the main symptom is having trouble falling asleep or having trouble staying asleep. This may lead to other symptoms, such as: ?Feeling fatigued or having low energy. ?Feeling nervous about going to sleep. ?Not feeling rested in the morning. ?Having trouble concentrating. ?Feeling irritable, anxious, or depressed. ?How is this diagnosed? ?This condition may be diagnosed based on: ?Your symptoms and medical history. Your health care provider may ask about: ?Your sleep habits. ?Any medical conditions you have. ?Your mental health. ?A physical exam. ?How is this treated? ?Treatment for insomnia depends on the cause. Treatment may focus on treating an underlying condition that is causing insomnia. Treatment may also include: ?Medicines to help you sleep. ?Counseling or therapy. ?Lifestyle adjustments to help you sleep better. ?Follow these instructions at home: ?Eating and drinking ? ?Limit or avoid alcohol, caffeinated beverages, and cigarettes, especially close to bedtime. These can disrupt your sleep. ?Do not eat a large meal or eat spicy foods right before bedtime. This can lead to digestive discomfort that can make it hard for you to sleep. ?Sleep habits ? ?Keep a sleep diary to help you and your health care provider figure out what could be causing your insomnia. Write down: ?When you sleep. ?When you wake up during the night. ?How well you sleep. ?How rested you feel the next day. ?Any side effects of medicines you are taking. ?What you eat and drink. ?Make your bedroom a dark, comfortable place  where it is easy to fall asleep. ?Put up shades or blackout curtains to block light from  outside. ?Use a white noise machine to block noise. ?Keep the temperature cool. ?Limit screen use before bedtime. This includes: ?Watching TV. ?Using your smartphone, tablet, or computer. ?Stick to a routine that includes going to bed and waking up at the same times every day and night. This can help you fall asleep faster. Consider making a quiet activity, such as reading, part of your nighttime routine. ?Try to avoid taking naps during the day so that you sleep better at night. ?Get out of bed if you are still awake after 15 minutes of trying to sleep. Keep the lights down, but try reading or doing a quiet activity. When you feel sleepy, go back to bed. ?General instructions ?Take over-the-counter and prescription medicines only as told by your health care provider. ?Exercise regularly, as told by your health care provider. Avoid exercise starting several hours before bedtime. ?Use relaxation techniques to manage stress. Ask your health care provider to suggest some techniques that may work well for you. These may include: ?Breathing exercises. ?Routines to release muscle tension. ?Visualizing peaceful scenes. ?Make sure that you drive carefully. Avoid driving if you feel very sleepy. ?Keep all follow-up visits as told by your health care provider. This is important. ?Contact a health care provider if: ?You are tired throughout the day. ?You have trouble in your daily routine due to sleepiness. ?You continue to have sleep problems, or your sleep problems get worse. ?Get help right away if: ?You have serious thoughts about hurting yourself or someone else. ?If you ever feel like you may hurt yourself or others, or have thoughts about taking your own life, get help right away. You can go to your nearest emergency department or call: ?Your local emergency services (911 in the [place] ?A suicide crisis helpline, such as the [facility] at 714-604-6451 or 988 in the [place]. This is open 24 hours  a day. ?Summary ?Insomnia is a sleep disorder that makes it difficult to fall asleep or stay asleep. ?Insomnia can be long-term (chronic) or short-term (acute). ?Treatment for insomnia depends on the cause. Treatment may focus on treating an underlying condition that is causing insomnia. ?Keep a sleep diary to help you and your health care provider figure out what could be causing your insomnia. ?This information is not intended to replace advice given to you by your health care provider. Make sure you discuss any questions you have with your health care provider. ?Document Revised: 08/13/2020 Document Reviewed: 11/29/2019 ?Elsevier Patient Education ? [place]. ? ?

## 2021-05-05 NOTE — Progress Notes (Signed)
? ?Established Patient Office Visit ? ?Subjective:  ?Patient ID: Katrina Stewart, female    DOB: 04/09/1995  Age: 26 y.o. MRN: [id number] ? ?CC:  ?Chief Complaint  ?Patient presents with  ? Follow-up  ? ? ?HPI ?Katrina Stewart reports that although she is feeling well today, states that she still has episodes of depression, occasional thoughts that she would be better off dead.  Once again adamantly denies any thoughts of self-harm. ? ?States that she has been taking the Abilify for approximately 2 years.  States that she has had weight gain since she started Abilify.  On review of patient's chart, she does not have weight reported of 261 in March 2021.  Weight today is 296. ? ?Reports that she does exercise every other day, states that she exercises for approximately 30 to 45 minutes.  States that she is drinking approximately 5 bottles of water a day.  States that she is working on improving her diet, states that she has a low appetite most days.  States all she ate yesterday was an apple and some grapes. ? ?States that she is only sleeping approximately 5 hours a night.  States that she only takes the trazodone if she feels she is not going to be able to fall asleep.  States that she will wait until approximately 3 in the morning to take the trazodone. ? ?Does endorse that her vaginal bleeding did resolve.  States that she is picking up the metronidazole and will start taking that for the bacterial vaginitis ?  ?Past Medical History:  ?Diagnosis Date  ? Anxiety   ? COVID-19 virus infection 12/05/2018  ? + again on 2/4 (asymptomatic)  postiive test in [facility] on 11-2  ? Depression   ? Depression   ? Phreesia 12/17/2019  ? Depression   ? Phreesia 12/30/2019  ? Essential hypertension   ? Gestational diabetes mellitus (GDM)   ? Normal postpartum 2 hr GTT  ? Group B streptococcal infection in pregnancy 03/13/2019  ? Rh negative state in antepartum period 09/03/2015  ? '[x]'$  needs rhogam at 28 weeks  [name] postive anti-D on  initial lab. Patient received rhogam on 08/02/18 during [facility] visit  ? Rubella non-immune status, antepartum 09/03/2015  ? Vaginal Pap smear, abnormal   ? ? ?Past Surgical History:  ?Procedure Laterality Date  ? BREAST SURGERY  2013  ? Lt & Rt excision juvenile fibroadenoma  ? MASS EXCISION  10/29/2011  ? Procedure: EXCISION MASS;  Surgeon: Harl Bowie, MD;  Location: [facility];  Service: General;  Laterality: Bilateral;  excision of bilateral breast masses  ? ? ?Family History  ?Problem Relation Age of Onset  ? Cancer Maternal Grandmother   ?     lung cancer  ? Cancer Other   ?     bladder cancer  ? Asthma Brother   ? ? ?Social History  ? ?Socioeconomic History  ? Marital status: Single  ?  Spouse name: Not on file  ? Number of children: Not on file  ? Years of education: Not on file  ? Highest education level: Not on file  ?Occupational History  ?  Comment: unemployed  ?Tobacco Use  ? Smoking status: Never  ? Smokeless tobacco: Never  ?Vaping Use  ? Vaping Use: Never used  ?Substance and Sexual Activity  ? Alcohol use: Not Currently  ?  Alcohol/week: 2.0 - 3.0 standard drinks  ?  Types: 2 - 3 Shots of liquor per week  ?  Comment: every weekend before pregnancy (4-5 drinks)  ? Drug use: No  ? Sexual activity: Not Currently  ?  Birth control/protection: None  ?Other Topics Concern  ? Not on file  ?Social History Narrative  ? Not on file  ? ?Social Determinants of Health  ? ?Financial Resource Strain: Not on file  ?Food Insecurity: Not on file  ?Transportation Needs: Not on file  ?Physical Activity: Not on file  ?Stress: Not on file  ?Social Connections: Not on file  ?Intimate Partner Violence: Not on file  ? ? ?Outpatient Medications Prior to Visit  ?Medication Sig Dispense Refill  ? ARIPiprazole (ABILIFY) 15 MG tablet Take 1 tablet (15 mg total) by mouth daily. 30 tablet 1  ? FLUoxetine (PROZAC) 20 MG capsule Take 3 capsules (60 mg total) by mouth daily. 90 capsule 1  ? metroNIDAZOLE (FLAGYL) 500 MG tablet Take 1  tablet (500 mg total) by mouth 2 (two) times daily for 7 days. 14 tablet 0  ? traZODone (DESYREL) 50 MG tablet Take 1 tablet (50 mg total) by mouth at bedtime. 30 tablet 1  ? ?No facility-administered medications prior to visit.  ? ? ?No Known Allergies ? ?ROS ?Review of Systems  ?Constitutional: Negative.   ?HENT: Negative.    ?Eyes: Negative.   ?Respiratory:  Negative for shortness of breath.   ?Cardiovascular:  Negative for chest pain.  ?Gastrointestinal: Negative.   ?Endocrine: Negative.   ?Genitourinary:  Negative for vaginal bleeding.  ?Musculoskeletal: Negative.   ?Skin: Negative.   ?Allergic/Immunologic: Negative.   ?Neurological: Negative.   ?Psychiatric/Behavioral:  Positive for dysphoric mood and sleep disturbance. Negative for self-injury and suicidal ideas. The patient is nervous/anxious.   ? ?  ?Objective:  ?  ?Physical Exam ?Vitals and nursing note reviewed.  ?Constitutional:   ?   Appearance: Normal appearance. She is obese.  ?HENT:  ?   Head: Normocephalic and atraumatic.  ?   Right Ear: External ear normal.  ?   Left Ear: External ear normal.  ?   Nose: Nose normal.  ?   Mouth/Throat:  ?   Mouth: Mucous membranes are moist.  ?   Pharynx: Oropharynx is clear.  ?Eyes:  ?   Extraocular Movements: Extraocular movements intact.  ?   Conjunctiva/sclera: Conjunctivae normal.  ?   Pupils: Pupils are equal, round, and reactive to light.  ?Cardiovascular:  ?   Rate and Rhythm: Normal rate and regular rhythm.  ?   Pulses: Normal pulses.  ?   Heart sounds: Normal heart sounds.  ?Pulmonary:  ?   Effort: Pulmonary effort is normal.  ?   Breath sounds: Normal breath sounds.  ?Musculoskeletal:     ?   General: Normal range of motion.  ?   Cervical back: Normal range of motion and neck supple.  ?Skin: ?   General: Skin is warm and dry.  ?Neurological:  ?   General: No focal deficit present.  ?   Mental Status: She is alert and oriented to person, place, and time.  ?Psychiatric:     ?   Mood and Affect: Mood normal.      ?   Behavior: Behavior normal.     ?   Thought Content: Thought content normal.     ?   Judgment: Judgment normal.  ? ? ?BP 124/83 (BP Location: Left Arm, Patient Position: Sitting, Cuff Size: Normal)   Pulse 78   Temp 98.2 ?F (36.8 ?C) (Oral)   Resp 18  Ht '5\' 8"'$  (1.727 m)   Wt 296 lb (134.3 kg)   LMP 05/04/2021 Comment: stopped  PF 96 L/min   BMI 45.01 kg/m?  ?Wt Readings from Last 3 Encounters:  ?05/05/21 296 lb (134.3 kg)  ?04/30/21 291 lb (132 kg)  ?07/02/20 270 lb (122.5 kg)  ? ? ? ?Health Maintenance Due  ?Topic Date Due  ? COVID-19 Vaccine (3 - Booster for Pfizer series) 08/21/2019  ? ? ?There are no preventive care reminders to display for this patient. ? ?Lab Results  ?Component Value Date  ? TSH 3.420 12/03/2014  ? ?Lab Results  ?Component Value Date  ? WBC 7.6 12/31/2019  ? HGB 12.3 12/31/2019  ? HCT 39.5 12/31/2019  ? MCV 73 (L) 12/31/2019  ? PLT 363 12/31/2019  ? ?Lab Results  ?Component Value Date  ? NA 139 12/31/2019  ? K 4.0 12/31/2019  ? CO2 20 12/31/2019  ? GLUCOSE 85 12/31/2019  ? BUN 9 12/31/2019  ? CREATININE 0.81 12/31/2019  ? BILITOT <0.2 12/31/2019  ? ALKPHOS 67 12/31/2019  ? AST 11 12/31/2019  ? ALT 14 12/31/2019  ? PROT 7.2 12/31/2019  ? ALBUMIN 4.7 12/31/2019  ? CALCIUM 9.6 12/31/2019  ? ANIONGAP 11 03/23/2019  ? ?Lab Results  ?Component Value Date  ? CHOL 149 12/31/2019  ? ?Lab Results  ?Component Value Date  ? HDL 40 12/31/2019  ? ?Lab Results  ?Component Value Date  ? [place] 97 12/31/2019  ? ?Lab Results  ?Component Value Date  ? TRIG 58 12/31/2019  ? ?Lab Results  ?Component Value Date  ? CHOLHDL 3.7 12/31/2019  ? ?Lab Results  ?Component Value Date  ? HGBA1C 5.8 (A) 05/05/2021  ? ? ?  ?Assessment & Plan:  ? ?Problem List Items Addressed This Visit   ? ?  ? Other  ? Severe episode of recurrent major depressive disorder, without psychotic features ([place]) - Primary  ? Relevant Medications  ? FLUoxetine (PROZAC) 20 MG capsule  ? traZODone (DESYREL) 50 MG tablet  ? Other  Relevant Orders  ? CBC with Differential/Platelet  ? Comp. Metabolic Panel (12)  ? TSH  ? Vitamin D, 25-hydroxy  ? ?Other Visit Diagnoses   ? ? Lipid screening      ? Relevant Orders  ? Lipid panel  ? Bipolar affective

## 2021-05-05 NOTE — Progress Notes (Signed)
Patient aware of results and reported to FU visit. ?

## 2021-05-06 LAB — CBC WITH DIFFERENTIAL/PLATELET
Basophils Absolute: 0.1 10*3/uL (ref 0.0–0.2)
Basos: 1 %
EOS (ABSOLUTE): 0.2 10*3/uL (ref 0.0–0.4)
Eos: 3 %
Hematocrit: 36.5 % (ref 34.0–46.6)
Hemoglobin: 11.5 g/dL (ref 11.1–15.9)
Immature Grans (Abs): 0 10*3/uL (ref 0.0–0.1)
Immature Granulocytes: 0 %
Lymphocytes Absolute: 3 10*3/uL (ref 0.7–3.1)
Lymphs: 47 %
MCH: 22.5 pg — ABNORMAL LOW (ref 26.6–33.0)
MCHC: 31.5 g/dL (ref 31.5–35.7)
MCV: 71 fL — ABNORMAL LOW (ref 79–97)
Monocytes Absolute: 0.4 10*3/uL (ref 0.1–0.9)
Monocytes: 7 %
Neutrophils Absolute: 2.6 10*3/uL (ref 1.4–7.0)
Neutrophils: 42 %
Platelets: 347 10*3/uL (ref 150–450)
RBC: 5.12 x10E6/uL (ref 3.77–5.28)
RDW: 14.9 % (ref 11.7–15.4)
WBC: 6.3 10*3/uL (ref 3.4–10.8)

## 2021-05-06 LAB — COMP. METABOLIC PANEL (12)
AST: 12 IU/L (ref 0–40)
Albumin/Globulin Ratio: 1.7 (ref 1.2–2.2)
Albumin: 4.3 g/dL (ref 3.9–5.0)
Alkaline Phosphatase: 64 IU/L (ref 44–121)
BUN/Creatinine Ratio: 10 (ref 9–23)
BUN: 9 mg/dL (ref 6–20)
Bilirubin Total: 0.3 mg/dL (ref 0.0–1.2)
Calcium: 9.3 mg/dL (ref 8.7–10.2)
Chloride: 104 mmol/L (ref 96–106)
Creatinine, Ser: 0.86 mg/dL (ref 0.57–1.00)
Globulin, Total: 2.5 g/dL (ref 1.5–4.5)
Glucose: 86 mg/dL (ref 70–99)
Potassium: 4 mmol/L (ref 3.5–5.2)
Sodium: 138 mmol/L (ref 134–144)
Total Protein: 6.8 g/dL (ref 6.0–8.5)
eGFR: 95 mL/min/{1.73_m2} (ref >59)

## 2021-05-06 LAB — LIPID PANEL
Chol/HDL Ratio: 4.3 ratio (ref 0.0–4.4)
Cholesterol, Total: 154 mg/dL (ref 100–199)
HDL: 36 mg/dL — ABNORMAL LOW (ref >39)
LDL Chol Calc (NIH): 104 mg/dL — ABNORMAL HIGH (ref 0–99)
Triglycerides: 71 mg/dL (ref 0–149)
VLDL Cholesterol Cal: 14 mg/dL (ref 5–40)

## 2021-05-06 LAB — VITAMIN D 25 HYDROXY (VIT D DEFICIENCY, FRACTURES): Vit D, 25-Hydroxy: 14.2 ng/mL — ABNORMAL LOW (ref 30.0–100.0)

## 2021-05-06 LAB — TSH: TSH: 3.11 u[IU]/mL (ref 0.450–4.500)

## 2021-05-06 MED ORDER — VITAMIN D (ERGOCALCIFEROL) 1.25 MG (50000 UNIT) PO CAPS: 50000.0000 [IU] | ORAL_CAPSULE | ORAL | 2 refills | Status: DC

## 2021-05-06 NOTE — Addendum Note (Signed)
Addended by: Kennieth Rad on: 05/06/2021 09:00 AM ? ? Modules accepted: Orders ? ?

## 2021-05-07 ENCOUNTER — Telehealth: Payer: Self-pay | Admitting: *Deleted

## 2021-05-07 NOTE — Telephone Encounter (Signed)
Patient verified DOB ?Patient viewed results via mychart and had no questions. Patient will take once a week vitamin d. ?

## 2021-05-07 NOTE — Telephone Encounter (Signed)
-----   Message from Kennieth Rad, PA-C sent at 05/06/2021  9:00 AM EDT ----- ?Please call for let her know that her kidney function , thyroid function and liver function are within normal limits.  She does not show signs of anemia.  Her cholesterol overall is within normal limits, however her LDL is slightly elevated, she should follow a low-cholesterol diet.  Her vitamin D levels are low, she needs to take 50,000 units once a week for the next 12 weeks and have it rechecked at that time.  Prescription sent to her pharmacy. ?

## 2021-05-19 ENCOUNTER — Ambulatory Visit (INDEPENDENT_AMBULATORY_CARE_PROVIDER_SITE_OTHER): Payer: Medicaid Other | Admitting: Physician Assistant

## 2021-05-19 ENCOUNTER — Encounter: Payer: Self-pay | Admitting: Physician Assistant

## 2021-05-19 VITALS — BP 123/84 | HR 74 | Temp 98.8°F | Resp 18 | Ht 68.0 in | Wt 286.0 lb

## 2021-05-19 DIAGNOSIS — F332 Major depressive disorder, recurrent severe without psychotic features: Secondary | ICD-10-CM | POA: Diagnosis not present

## 2021-05-19 DIAGNOSIS — F3160 Bipolar disorder, current episode mixed, unspecified: Secondary | ICD-10-CM | POA: Diagnosis not present

## 2021-05-19 DIAGNOSIS — R7303 Prediabetes: Secondary | ICD-10-CM | POA: Diagnosis not present

## 2021-05-19 DIAGNOSIS — F5104 Psychophysiologic insomnia: Secondary | ICD-10-CM | POA: Diagnosis not present

## 2021-05-19 NOTE — Progress Notes (Signed)
Patient has eaten and taken medication today. ?Patient reports "feeling better today" ?Patient denies any pain at this time. ?

## 2021-05-19 NOTE — Patient Instructions (Signed)
I strongly encourage you to continue using the trazodone to help with sleep. ? ?Amazing job on the weight loss, keep up the great work. ? ?Please let Katrina Stewart know if there is anything else we can do for you ? ?Kennieth Rad, PA-C ?Physician Assistant ?[facility] ?[email or web address] ? ? ?Health Maintenance, Female ?Adopting a healthy lifestyle and getting preventive care are important in promoting health and wellness. Ask your health care provider about: ?The right schedule for you to have regular tests and exams. ?Things you can do on your own to prevent diseases and keep yourself healthy. ?What should I know about diet, weight, and exercise? ?Eat a healthy diet ? ?Eat a diet that includes plenty of vegetables, fruits, low-fat dairy products, and lean protein. ?Do not eat a lot of foods that are high in solid fats, added sugars, or sodium. ?Maintain a healthy weight ?Body mass index (BMI) is used to identify weight problems. It estimates body fat based on height and weight. Your health care provider can help determine your BMI and help you achieve or maintain a healthy weight. ?Get regular exercise ?Get regular exercise. This is one of the most important things you can do for your health. Most adults should: ?Exercise for at least 150 minutes each week. The exercise should increase your heart rate and make you sweat (moderate-intensity exercise). ?Do strengthening exercises at least twice a week. This is in addition to the moderate-intensity exercise. ?Spend less time sitting. Even light physical activity can be beneficial. ?Watch cholesterol and blood lipids ?Have your blood tested for lipids and cholesterol at 28 years of age, then have this test every 5 years. ?Have your cholesterol levels checked more often if: ?Your lipid or cholesterol levels are high. ?You are older than 28 years of age. ?You are at high risk for heart disease. ?What should I know about  cancer screening? ?Depending on your health history and family history, you may need to have cancer screening at various ages. This may include screening for: ?Breast cancer. ?Cervical cancer. ?Colorectal cancer. ?Skin cancer. ?Lung cancer. ?What should I know about heart disease, diabetes, and high blood pressure? ?Blood pressure and heart disease ?High blood pressure causes heart disease and increases the risk of stroke. This is more likely to develop in people who have high blood pressure readings or are overweight. ?Have your blood pressure checked: ?Every 3-5 years if you are 37-58 years of age. ?Every year if you are 76 years old or older. ?Diabetes ?Have regular diabetes screenings. This checks your fasting blood sugar level. Have the screening done: ?Once every three years after age 71 if you are at a normal weight and have a low risk for diabetes. ?More often and at a younger age if you are overweight or have a high risk for diabetes. ?What should I know about preventing infection? ?Hepatitis B ?If you have a higher risk for hepatitis B, you should be screened for this virus. Talk with your health care provider to find out if you are at risk for hepatitis B infection. ?Hepatitis C ?Testing is recommended for: ?Everyone born from [birth year] through 1967. ?Anyone with known risk factors for hepatitis C. ?Sexually transmitted infections (STIs) ?Get screened for STIs, including gonorrhea and chlamydia, if: ?You are sexually active and are younger than 28 years of age. ?You are older than 28 years of age and your health care provider tells you that you are at risk for this type of infection. ?Your sexual activity  has changed since you were last screened, and you are at increased risk for chlamydia or gonorrhea. Ask your health care provider if you are at risk. ?Ask your health care provider about whether you are at high risk for HIV. Your health care provider may recommend a prescription medicine to help prevent HIV  infection. If you choose to take medicine to prevent HIV, you should first get tested for HIV. You should then be tested every 3 months for as long as you are taking the medicine. ?Pregnancy ?If you are about to stop having your period (premenopausal) and you may become pregnant, seek counseling before you get pregnant. ?Take 400 to 800 micrograms (mcg) of folic acid every day if you become pregnant. ?Ask for birth control (contraception) if you want to prevent pregnancy. ?Osteoporosis and menopause ?Osteoporosis is a disease in which the bones lose minerals and strength with aging. This can result in bone fractures. If you are 26 years old or older, or if you are at risk for osteoporosis and fractures, ask your health care provider if you should: ?Be screened for bone loss. ?Take a calcium or vitamin D supplement to lower your risk of fractures. ?Be given hormone replacement therapy (HRT) to treat symptoms of menopause. ?Follow these instructions at home: ?Alcohol use ?Do not drink alcohol if: ?Your health care provider tells you not to drink. ?You are pregnant, may be pregnant, or are planning to become pregnant. ?If you drink alcohol: ?Limit how much you have to: ?0-1 drink a day. ?Know how much alcohol is in your drink. In the [place] one drink equals one 12 oz bottle of beer (355 mL), one 5 oz glass of wine (148 mL), or one 1? oz glass of hard liquor (44 mL). ?Lifestyle ?Do not use any products that contain nicotine or tobacco. These products include cigarettes, chewing tobacco, and vaping devices, such as e-cigarettes. If you need help quitting, ask your health care provider. ?Do not use street drugs. ?Do not share needles. ?Ask your health care provider for help if you need support or information about quitting drugs. ?General instructions ?Schedule regular health, dental, and eye exams. ?Stay current with your vaccines. ?Tell your health care provider if: ?You often feel depressed. ?You have ever been abused  or do not feel safe at home. ?Summary ?Adopting a healthy lifestyle and getting preventive care are important in promoting health and wellness. ?Follow your health care provider's instructions about healthy diet, exercising, and getting tested or screened for diseases. ?Follow your health care provider's instructions on monitoring your cholesterol and blood pressure. ?This information is not intended to replace advice given to you by your health care provider. Make sure you discuss any questions you have with your health care provider. ?Document Revised: 06/09/2020 Document Reviewed: 06/09/2020 ?Elsevier Patient Education ? [place]. ? ?

## 2021-05-19 NOTE — Progress Notes (Signed)
? Office Visit ? ?Subjective   ? ?Patient ID: Katrina Stewart, female    DOB: 09/21/1995  Age: 26 y.o. MRN: [id number] ? ?CC:  ?Chief Complaint  ?Patient presents with  ? Follow-up  ?  Depression  ? ? ?HPI ?Katrina Stewart states that she is feeling much better.  States that she is sleeping 7 to 8 hours a night, using 50 mg of trazodone. ? ?States that she did start the metformin, states that she feels her appetite is the same, but does endorse a 10 pound weight loss since our office visit on May 05, 2021. ? ?No other concerns at this time ? ? ?  05/19/2021  ?  8:46 AM 05/05/2021  ?  9:15 AM 04/30/2021  ?  3:21 PM  ?PHQ9 SCORE ONLY  ?PHQ-9 Total Score '7 15 14  '$ ? ? ?  05/19/2021  ?  8:46 AM 05/05/2021  ?  9:15 AM 04/30/2021  ?  3:21 PM 07/02/2020  ?  9:02 AM  ?GAD 7 : Generalized Anxiety Score  ?Nervous, Anxious, on Edge '2 2 1 1  '$ ?Control/stop worrying '2 2 1 1  '$ ?Worry too much - different things '2 3 2 1  '$ ?Trouble relaxing '1 1 1 1  '$ ?Restless 0 0 1 0  ?Easily annoyed or irritable '3 3 3 2  '$ ?Afraid - awful might happen '1 2 1 1  '$ ?Total GAD 7 Score '11 13 10 7  '$ ? ? ? ? ? ? ?Outpatient Encounter Medications as of 05/19/2021  ?Medication Sig  ? ARIPiprazole (ABILIFY) 15 MG tablet Take 1 tablet (15 mg total) by mouth daily.  ? FLUoxetine (PROZAC) 20 MG capsule Take 3 capsules (60 mg total) by mouth daily.  ? metFORMIN (GLUCOPHAGE-XR) 500 MG 24 hr tablet Take 1 tablet (500 mg total) by mouth daily with breakfast.  ? traZODone (DESYREL) 50 MG tablet Take 1 tablet (50 mg total) by mouth at bedtime.  ? Vitamin D, Ergocalciferol, (DRISDOL) 1.25 MG (50000 UNIT) CAPS capsule Take 1 capsule (50,000 Units total) by mouth every 7 (seven) days.  ? ?No facility-administered encounter medications on file as of 05/19/2021.  ? ? ?Past Medical History:  ?Diagnosis Date  ? Anxiety   ? COVID-19 virus infection 12/05/2018  ? + again on 2/4 (asymptomatic)  postiive test in [facility]U on 11-2  ? Depression   ? Depression   ? Phreesia 12/17/2019  ? Depression   ?  Phreesia 12/30/2019  ? Essential hypertension   ? Gestational diabetes mellitus (GDM)   ? Normal postpartum 2 hr GTT  ? Group B streptococcal infection in pregnancy 03/13/2019  ? Rh negative state in antepartum period 09/03/2015  ? '[x]'$  needs rhogam at 28 weeks  [name] postive anti-D on initial lab. Patient received rhogam on 08/02/18 during MAU visit  ? Rubella non-immune status, antepartum 09/03/2015  ? Vaginal Pap smear, abnormal   ? ? ?Past Surgical History:  ?Procedure Laterality Date  ? BREAST SURGERY  2013  ? Lt & Rt excision juvenile fibroadenoma  ? MASS EXCISION  10/29/2011  ? Procedure: EXCISION MASS;  Surgeon: Harl Bowie, MD;  Location: [facility];  Service: General;  Laterality: Bilateral;  excision of bilateral breast masses  ? ? ?Family History  ?Problem Relation Age of Onset  ? Cancer Maternal Grandmother   ?     lung cancer  ? Cancer Other   ?     bladder cancer  ? Asthma Brother   ? ? ?Social History  ? ?  Socioeconomic History  ? Marital status: Single  ?  Spouse name: Not on file  ? Number of children: Not on file  ? Years of education: Not on file  ? Highest education level: Not on file  ?Occupational History  ?  Comment: unemployed  ?Tobacco Use  ? Smoking status: Never  ? Smokeless tobacco: Never  ?Vaping Use  ? Vaping Use: Never used  ?Substance and Sexual Activity  ? Alcohol use: Not Currently  ?  Alcohol/week: 2.0 - 3.0 standard drinks  ?  Types: 2 - 3 Shots of liquor per week  ?  Comment: every weekend before pregnancy (4-5 drinks)  ? Drug use: No  ? Sexual activity: Not Currently  ?  Birth control/protection: None  ?Other Topics Concern  ? Not on file  ?Social History Narrative  ? Not on file  ? ?Social Determinants of Health  ? ?Financial Resource Strain: Not on file  ?Food Insecurity: Not on file  ?Transportation Needs: Not on file  ?Physical Activity: Not on file  ?Stress: Not on file  ?Social Connections: Not on file  ?Intimate Partner Violence: Not on file  ? ? ?Review of Systems   ?Constitutional: Negative.   ?HENT: Negative.    ?Eyes: Negative.   ?Respiratory:  Negative for shortness of breath.   ?Cardiovascular:  Negative for chest pain.  ?Gastrointestinal: Negative.   ?Genitourinary: Negative.   ?Musculoskeletal: Negative.   ?Skin: Negative.   ?Neurological: Negative.   ?Endo/Heme/Allergies: Negative.   ?Psychiatric/Behavioral:  Negative for depression. The patient is nervous/anxious. The patient does not have insomnia.   ? ?  ? ? ?Objective   ? ?BP 123/84 (BP Location: Right Arm, Patient Position: Sitting, Cuff Size: Large)   Pulse 74   Temp 98.8 ?F (37.1 ?C) (Oral)   Resp 18   Ht '5\' 8"'$  (1.727 m)   Wt 286 lb (129.7 kg)   LMP 04/27/2021 Comment: stopped  SpO2 100%   BMI 43.49 kg/m?  ? ?Physical Exam ?Vitals and nursing note reviewed.  ?Constitutional:   ?   Appearance: Normal appearance.  ?HENT:  ?   Head: Normocephalic and atraumatic.  ?   Right Ear: External ear normal.  ?   Left Ear: External ear normal.  ?   Nose: Nose normal.  ?   Mouth/Throat:  ?   Mouth: Mucous membranes are moist.  ?   Pharynx: Oropharynx is clear.  ?Eyes:  ?   Extraocular Movements: Extraocular movements intact.  ?   Conjunctiva/sclera: Conjunctivae normal.  ?   Pupils: Pupils are equal, round, and reactive to light.  ?Cardiovascular:  ?   Rate and Rhythm: Normal rate and regular rhythm.  ?   Pulses: Normal pulses.  ?   Heart sounds: Normal heart sounds.  ?Pulmonary:  ?   Effort: Pulmonary effort is normal.  ?   Breath sounds: Normal breath sounds.  ?Musculoskeletal:     ?   General: Normal range of motion.  ?   Cervical back: Normal range of motion and neck supple.  ?Skin: ?   General: Skin is warm and dry.  ?Neurological:  ?   General: No focal deficit present.  ?   Mental Status: She is alert and oriented to person, place, and time.  ?Psychiatric:     ?   Mood and Affect: Mood normal.     ?   Behavior: Behavior normal.     ?   Thought Content: Thought content normal.     ?  Judgment: Judgment normal.   ? ? ?  ? ?Assessment & Plan:  ? ?Problem List Items Addressed This Visit   ? ?  ? Other  ? Severe episode of recurrent major depressive disorder, without psychotic features ([place]) - Primary  ? Bipolar affective disorder, current episode mixed ([place])  ? Psychophysiological insomnia  ? Prediabetes  ? ?1. Severe episode of recurrent major depressive disorder, without psychotic features ([place]) ?Continue current regimen.  Patient given appointment to establish care with Dr. Redmond Pulling.  Red flags given for prompt reevaluation. ? ?2. Bipolar affective disorder, current episode mixed, current episode severity unspecified ([place]) ? ? ?3. Psychophysiological insomnia ?Continue current regimen, no refill needed today ? ?4. Prediabetes ?Continue current regimen, no refill needed today. ? ? ?I have reviewed the patient's medical history (PMH, PSH, Social History, Family History, Medications, and allergies) , and have been updated if relevant. I spent 30 minutes reviewing chart and  face to face time with patient. ? ? ? ?Return for needs PCP here  in 4-6 weeks .  ? ?Gurley Climer S Mayers, PA-C ? ? ?

## 2021-07-01 ENCOUNTER — Encounter: Payer: Self-pay | Admitting: Family Medicine

## 2021-07-01 ENCOUNTER — Ambulatory Visit (INDEPENDENT_AMBULATORY_CARE_PROVIDER_SITE_OTHER): Payer: Medicaid Other | Admitting: Family Medicine

## 2021-07-01 VITALS — BP 123/81 | HR 84 | Temp 98.1°F | Resp 16 | Ht 69.0 in | Wt 284.6 lb

## 2021-07-01 DIAGNOSIS — F332 Major depressive disorder, recurrent severe without psychotic features: Secondary | ICD-10-CM | POA: Diagnosis not present

## 2021-07-01 DIAGNOSIS — R7303 Prediabetes: Secondary | ICD-10-CM

## 2021-07-01 DIAGNOSIS — F3160 Bipolar disorder, current episode mixed, unspecified: Secondary | ICD-10-CM

## 2021-07-01 DIAGNOSIS — F5104 Psychophysiologic insomnia: Secondary | ICD-10-CM | POA: Diagnosis not present

## 2021-07-01 MED ORDER — FLUOXETINE HCL 20 MG PO CAPS: 60.0000 mg | ORAL_CAPSULE | Freq: Every day | ORAL | 1 refills | Status: DC

## 2021-07-01 MED ORDER — TRAZODONE HCL 50 MG PO TABS: 50.0000 mg | ORAL_TABLET | Freq: Every day | ORAL | 1 refills | Status: DC

## 2021-07-01 MED ORDER — METFORMIN HCL ER 500 MG PO TB24: 500.0000 mg | ORAL_TABLET | Freq: Every day | ORAL | 1 refills | Status: DC

## 2021-07-01 MED ORDER — ARIPIPRAZOLE 15 MG PO TABS: 15.0000 mg | ORAL_TABLET | Freq: Every day | ORAL | 1 refills | Status: DC

## 2021-07-02 ENCOUNTER — Encounter: Payer: Self-pay | Admitting: Family Medicine

## 2021-07-02 NOTE — Progress Notes (Signed)
Established Patient Office Visit  Subjective    Patient ID: Katrina Stewart, female    DOB: 1995-10-27  Age: 26 y.o. MRN: [id number]  CC:  Chief Complaint  Patient presents with   Follow-up    HPI Earnstine Meinders presents to establish care with a new provider. Patient complains that her BH meds are not effective anymore. She would like to get them changed or increased.    Outpatient Encounter Medications as of 07/01/2021  Medication Sig   Vitamin D, Ergocalciferol, (DRISDOL) 1.25 MG (50000 UNIT) CAPS capsule Take 1 capsule (50,000 Units total) by mouth every 7 (seven) days.   [DISCONTINUED] ARIPiprazole (ABILIFY) 15 MG tablet Take 1 tablet (15 mg total) by mouth daily.   [DISCONTINUED] FLUoxetine (PROZAC) 20 MG capsule Take 3 capsules (60 mg total) by mouth daily.   [DISCONTINUED] metFORMIN (GLUCOPHAGE-XR) 500 MG 24 hr tablet Take 1 tablet (500 mg total) by mouth daily with breakfast.   [DISCONTINUED] traZODone (DESYREL) 50 MG tablet Take 1 tablet (50 mg total) by mouth at bedtime.   ARIPiprazole (ABILIFY) 15 MG tablet Take 1 tablet (15 mg total) by mouth daily.   FLUoxetine (PROZAC) 20 MG capsule Take 3 capsules (60 mg total) by mouth daily.   metFORMIN (GLUCOPHAGE-XR) 500 MG 24 hr tablet Take 1 tablet (500 mg total) by mouth daily with breakfast.   traZODone (DESYREL) 50 MG tablet Take 1 tablet (50 mg total) by mouth at bedtime.   No facility-administered encounter medications on file as of 07/01/2021.    Past Medical History:  Diagnosis Date   Anxiety    COVID-19 virus infection 12/05/2018   + again on 2/4 (asymptomatic)  postiive test in MAU on 11-2   Depression    Depression    Phreesia 12/17/2019   Depression    Phreesia 12/30/2019   Essential hypertension    Gestational diabetes mellitus (GDM)    Normal postpartum 2 hr GTT   Group B streptococcal infection in pregnancy 03/13/2019   Rh negative state in antepartum period 09/03/2015   '[x]'$  needs rhogam at 28 weeks   [name] postive anti-D on initial lab. Patient received rhogam on 08/02/18 during MAU visit   Rubella non-immune status, antepartum 09/03/2015   Vaginal Pap smear, abnormal     Past Surgical History:  Procedure Laterality Date   BREAST SURGERY  2013   Lt & Rt excision juvenile fibroadenoma   MASS EXCISION  10/29/2011   Procedure: EXCISION MASS;  Surgeon: Harl Bowie, MD;  Location: [facility];  Service: General;  Laterality: Bilateral;  excision of bilateral breast masses    Family History  Problem Relation Age of Onset   Cancer Maternal Grandmother        lung cancer   Cancer Other        bladder cancer   Asthma Brother     Social History   Socioeconomic History   Marital status: Single    Spouse name: Not on file   Number of children: Not on file   Years of education: Not on file   Highest education level: Not on file  Occupational History    Comment: unemployed  Tobacco Use   Smoking status: Never   Smokeless tobacco: Never  Vaping Use   Vaping Use: Never used  Substance and Sexual Activity   Alcohol use: Not Currently    Alcohol/week: 2.0 - 3.0 standard drinks    Types: 2 - 3 Shots of liquor per week    Comment:  every weekend before pregnancy (4-5 drinks)   Drug use: No   Sexual activity: Not Currently    Birth control/protection: None  Other Topics Concern   Not on file  Social History Narrative   Not on file   Social Determinants of Health   Financial Resource Strain: Not on file  Food Insecurity: Not on file  Transportation Needs: Not on file  Physical Activity: Not on file  Stress: Not on file  Social Connections: Not on file  Intimate Partner Violence: Not on file    Review of Systems  Psychiatric/Behavioral:  Positive for depression. Negative for suicidal ideas. The patient is nervous/anxious and has insomnia.        Objective    BP 123/81   Pulse 84   Temp 98.1 F (36.7 C) (Oral)   Resp 16   Ht '5\' 9"'$  (1.753 m)   Wt 284 lb 9.6 oz  (129.1 kg)   SpO2 97%   BMI 42.03 kg/m   Physical Exam Vitals and nursing note reviewed.  Constitutional:      General: She is not in acute distress. Cardiovascular:     Rate and Rhythm: Normal rate and regular rhythm.  Pulmonary:     Effort: Pulmonary effort is normal.     Breath sounds: Normal breath sounds.  Neurological:     General: No focal deficit present.     Mental Status: She is alert and oriented to person, place, and time.  Psychiatric:        Mood and Affect: Mood is depressed. Affect is flat.        Speech: Speech normal.        Behavior: Behavior normal. Behavior is cooperative.        Assessment & Plan:   1. Severe episode of recurrent major depressive disorder, without psychotic features [facility]) Referral to [facility] for med management. Present meds refilled in the interim.  - Ambulatory referral to Psychology - FLUoxetine (PROZAC) 20 MG capsule; Take 3 capsules (60 mg total) by mouth daily.  Dispense: 90 capsule; Refill: 1  2. Bipolar affective disorder, current episode mixed, current episode severity unspecified ([place]) As above - Ambulatory referral to Psychology - ARIPiprazole (ABILIFY) 15 MG tablet; Take 1 tablet (15 mg total) by mouth daily.  Dispense: 30 tablet; Refill: 1  3. Psychophysiological insomnia Continue present management. Meds refilled.  - traZODone (DESYREL) 50 MG tablet; Take 1 tablet (50 mg total) by mouth at bedtime.  Dispense: 30 tablet; Refill: 1  4. Prediabetes Continue present management and monitor - metFORMIN (GLUCOPHAGE-XR) 500 MG 24 hr tablet; Take 1 tablet (500 mg total) by mouth daily with breakfast.  Dispense: 90 tablet; Refill: 1    No follow-ups on file.   Becky Sax, MD

## 2021-08-05 DIAGNOSIS — F411 Generalized anxiety disorder: Secondary | ICD-10-CM | POA: Diagnosis not present

## 2021-08-19 DIAGNOSIS — F411 Generalized anxiety disorder: Secondary | ICD-10-CM | POA: Diagnosis not present

## 2021-08-31 DIAGNOSIS — F411 Generalized anxiety disorder: Secondary | ICD-10-CM | POA: Diagnosis not present

## 2021-09-07 DIAGNOSIS — F411 Generalized anxiety disorder: Secondary | ICD-10-CM | POA: Diagnosis not present

## 2021-09-17 DIAGNOSIS — F411 Generalized anxiety disorder: Secondary | ICD-10-CM | POA: Diagnosis not present

## 2021-09-25 DIAGNOSIS — F411 Generalized anxiety disorder: Secondary | ICD-10-CM | POA: Diagnosis not present

## 2021-10-06 DIAGNOSIS — F411 Generalized anxiety disorder: Secondary | ICD-10-CM | POA: Diagnosis not present

## 2021-10-20 DIAGNOSIS — F411 Generalized anxiety disorder: Secondary | ICD-10-CM | POA: Diagnosis not present

## 2021-11-13 ENCOUNTER — Other Ambulatory Visit: Payer: Self-pay | Admitting: Family Medicine

## 2021-11-13 DIAGNOSIS — R7303 Prediabetes: Secondary | ICD-10-CM

## 2021-11-13 MED ORDER — METFORMIN HCL ER 500 MG PO TB24: 500.0000 mg | ORAL_TABLET | Freq: Every day | ORAL | 0 refills | Status: DC

## 2021-11-13 NOTE — Telephone Encounter (Signed)
Patient will need an office visit for further refills.  Requested Prescriptions  Pending Prescriptions Disp Refills  . metFORMIN (GLUCOPHAGE-XR) 500 MG 24 hr tablet 90 tablet 0    Sig: Take 1 tablet (500 mg total) by mouth daily with breakfast.     Endocrinology:  Diabetes - Biguanides Failed - 11/13/2021  4:21 PM      Failed - HBA1C is between 0 and 7.9 and within 180 days    Hemoglobin A1C  Date Value Ref Range Status  05/05/2021 5.8 (A) 4.0 - 5.6 % Final   Hgb A1c MFr Bld  Date Value Ref Range Status  12/31/2019 5.7 (H) 4.8 - 5.6 % Final    Comment:             Prediabetes: 5.7 - 6.4          Diabetes: >6.4          Glycemic control for adults with diabetes: <7.0          Failed - B12 Level in normal range and within 720 days    No results found for: "VITAMINB12"       Passed - Cr in normal range and within 360 days    Creatinine, Ser  Date Value Ref Range Status  05/05/2021 0.86 0.57 - 1.00 mg/dL Final   Creatinine, Urine  Date Value Ref Range Status  03/23/2019 129.15 mg/dL Final         Passed - eGFR in normal range and within 360 days    GFR calc Af Amer  Date Value Ref Range Status  12/31/2019 116 >59 mL/min/1.73 Final    Comment:    **In accordance with recommendations from the NKF-ASN Task force,**   [facility] is in the process of updating its eGFR calculation to the   2021 CKD-EPI creatinine equation that estimates kidney function   without a race variable.    GFR calc non Af Amer  Date Value Ref Range Status  12/31/2019 101 >59 mL/min/1.73 Final   eGFR  Date Value Ref Range Status  05/05/2021 95 >59 mL/min/1.73 Final         Passed - Valid encounter within last 6 months    Recent Outpatient Visits          4 months ago Severe episode of recurrent major depressive disorder, without psychotic features ([place])   [facility] Care at [facility], MD   5 months ago Severe episode of recurrent major depressive disorder, without psychotic  features [facility])   Primary Care at [facility], Cari S, PA-C   6 months ago Severe episode of recurrent major depressive disorder, without psychotic features ([place])   Primary Care at [facility], Cari S, PA-C   6 months ago Hematuria, unspecified type   Primary Care at [facility], Cari S, PA-C   1 year ago Chronic hypertension during pregnancy   Primary Care at [facility], Bayard Beaver, MD             Passed - CBC within normal limits and completed in the last 12 months    WBC  Date Value Ref Range Status  05/05/2021 6.3 3.4 - 10.8 x10E3/uL Final  03/24/2019 9.5 4.0 - 10.5 K/uL Final   RBC  Date Value Ref Range Status  05/05/2021 5.12 3.77 - 5.28 x10E6/uL Final  03/24/2019 4.81 3.87 - 5.11 MIL/uL Final   Hemoglobin  Date Value Ref Range Status  05/05/2021 11.5 11.1 - 15.9  g/dL Final   Hematocrit  Date Value Ref Range Status  05/05/2021 36.5 34.0 - 46.6 % Final   MCHC  Date Value Ref Range Status  05/05/2021 31.5 31.5 - 35.7 g/dL Final  03/24/2019 31.1 30.0 - 36.0 g/dL Final   [facility]  Date Value Ref Range Status  05/05/2021 22.5 (L) 26.6 - 33.0 pg Final  03/24/2019 22.0 (L) 26.0 - 34.0 pg Final   MCV  Date Value Ref Range Status  05/05/2021 71 (L) 79 - 97 fL Final   No results found for: "PLTCOUNTKUC", "LABPLAT", "POCPLA" RDW  Date Value Ref Range Status  05/05/2021 14.9 11.7 - 15.4 % Final

## 2021-11-13 NOTE — Telephone Encounter (Signed)
Copied from [place] [id number]. Topic: General - Other >> Nov 13, 2021  2:58 PM Everette C wrote: Reason for CRM: Medication Refill - Medication: metFORMIN (GLUCOPHAGE-XR) 500 MG 24 hr tablet [[id number]]   Has the patient contacted their pharmacy? No. The patient was uncertain of the medication's status  (Agent: If no, request that the patient contact the pharmacy for the refill. If patient does not wish to contact the pharmacy document the reason why and proceed with request.) (Agent: If yes, when and what did the pharmacy advise?)  Preferred Pharmacy (with phone number or street name): [facility] ([address] [place] [address] [place] ([place]) [place] [address] Phone: [phone] Fax: [phone] Hours: Not open 24 hours   Has the patient been seen for an appointment in the last year OR does the patient have an upcoming appointment? Yes.    Agent: Please be advised that RX refills may take up to 3 business days. We ask that you follow-up with your pharmacy.

## 2021-11-27 DIAGNOSIS — F411 Generalized anxiety disorder: Secondary | ICD-10-CM | POA: Diagnosis not present

## 2021-12-04 DIAGNOSIS — F411 Generalized anxiety disorder: Secondary | ICD-10-CM | POA: Diagnosis not present

## 2021-12-11 DIAGNOSIS — F411 Generalized anxiety disorder: Secondary | ICD-10-CM | POA: Diagnosis not present

## 2022-02-03 ENCOUNTER — Other Ambulatory Visit: Payer: Self-pay | Admitting: Family Medicine

## 2022-02-03 DIAGNOSIS — F332 Major depressive disorder, recurrent severe without psychotic features: Secondary | ICD-10-CM

## 2022-02-03 DIAGNOSIS — F5104 Psychophysiologic insomnia: Secondary | ICD-10-CM

## 2022-02-03 DIAGNOSIS — F3160 Bipolar disorder, current episode mixed, unspecified: Secondary | ICD-10-CM

## 2022-02-03 DIAGNOSIS — R7303 Prediabetes: Secondary | ICD-10-CM

## 2022-02-03 NOTE — Telephone Encounter (Signed)
Medication Refill - Medication: ARIPiprazole (ABILIFY) 15 MG tablet  FLUoxetine (PROZAC) 20 MG capsule  metFORMIN (GLUCOPHAGE-XR) 500 MG 24 hr tablet  traZODone (DESYREL) 50 MG tablet   Has the patient contacted their pharmacy? Yes.    Pharmacy referred pt to her PCP.  Pt is out of all medications.    Preferred Pharmacy (with phone number or street name):  [facility] [place] [address] Phone: [phone]  Fax: [phone]     Has the patient been seen for an appointment in the last year OR does the patient have an upcoming appointment? Yes.    Agent: Please be advised that RX refills may take up to 3 business days. We ask that you follow-up with your pharmacy.

## 2022-02-04 NOTE — Telephone Encounter (Signed)
Requested medication (s) are due for refill today: yes to all  Requested medication (s) are on the active medication list: yes  Last refill:  Apiprazole: 07/01/21      Fluoxetine: 07/01/21         Metformin: 11/13/21          trazodone: 07/01/21  Future visit scheduled: no   Notes to clinic:  called pt and LM on VM to call back to make appt for med refills   Requested Prescriptions  Pending Prescriptions Disp Refills   ARIPiprazole (ABILIFY) 15 MG tablet 30 tablet 1    Sig: Take 1 tablet (15 mg total) by mouth daily.     Not Delegated - Psychiatry:  Antipsychotics - Second Generation (Atypical) - aripiprazole Failed - 02/03/2022  4:31 PM      Failed - This refill cannot be delegated      Failed - Valid encounter within last 6 months    Recent Outpatient Visits           7 months ago Severe episode of recurrent major depressive disorder, without psychotic features ([place])   Primary Care at [facility], MD   8 months ago Severe episode of recurrent major depressive disorder, without psychotic features ([place])   Primary Care at [facility], Cari S, PA-C   9 months ago Severe episode of recurrent major depressive disorder, without psychotic features ([place])   Primary Care at [facility], Cari S, PA-C   9 months ago Hematuria, unspecified type   Primary Care at [facility], Cari S, PA-C   1 year ago Chronic hypertension during pregnancy   Primary Care at [facility], Bayard Beaver, MD              Failed - Lipid Panel in normal range within the last 12 months    Cholesterol, Total  Date Value Ref Range Status  05/05/2021 154 100 - 199 mg/dL Final   LDL Chol Calc (NIH)  Date Value Ref Range Status  05/05/2021 104 (H) 0 - 99 mg/dL Final   HDL  Date Value Ref Range Status  05/05/2021 36 (L) >39 mg/dL Final   Triglycerides  Date Value Ref Range Status  05/05/2021 71 0 - 149 mg/dL Final         Failed - CMP within  normal limits and completed in the last 12 months    Albumin  Date Value Ref Range Status  05/05/2021 4.3 3.9 - 5.0 g/dL Final   Alkaline Phosphatase  Date Value Ref Range Status  05/05/2021 64 44 - 121 IU/L Final   ALT  Date Value Ref Range Status  12/31/2019 14 0 - 32 IU/L Final   AST  Date Value Ref Range Status  05/05/2021 12 0 - 40 IU/L Final   BUN  Date Value Ref Range Status  05/05/2021 9 6 - 20 mg/dL Final   Calcium  Date Value Ref Range Status  05/05/2021 9.3 8.7 - 10.2 mg/dL Final   CO2  Date Value Ref Range Status  12/31/2019 20 20 - 29 mmol/L Final   Creatinine, Ser  Date Value Ref Range Status  05/05/2021 0.86 0.57 - 1.00 mg/dL Final   Creatinine, Urine  Date Value Ref Range Status  03/23/2019 129.15 mg/dL Final   Glucose  Date Value Ref Range Status  05/05/2021 86 70 - 99 mg/dL Final   Glucose Tolerance, Fasting  Date Value Ref Range Status  09/08/2015 76 65 -  104 mg/dL Final    Comment:    Patient identifiers, specimen collection times and test results have been confirmed.    Glucose, Bld  Date Value Ref Range Status  03/23/2019 80 70 - 99 mg/dL Final   Glucose, 2 hour  Date Value Ref Range Status  04/25/2019 97 65 - 139 mg/dL Final   Glucose-Capillary  Date Value Ref Range Status  03/24/2019 91 70 - 99 mg/dL Final   Potassium  Date Value Ref Range Status  05/05/2021 4.0 3.5 - 5.2 mmol/L Final   Sodium  Date Value Ref Range Status  05/05/2021 138 134 - 144 mmol/L Final   Bilirubin Total  Date Value Ref Range Status  05/05/2021 0.3 0.0 - 1.2 mg/dL Final   Protein, ur  Date Value Ref Range Status  02/23/2019 NEGATIVE NEGATIVE mg/dL Final   Total Protein, Urine  Date Value Ref Range Status  03/23/2019 10 mg/dL Final    Comment:    NO NORMAL RANGE ESTABLISHED FOR THIS TEST   Total Protein  Date Value Ref Range Status  05/05/2021 6.8 6.0 - 8.5 g/dL Final   GFR calc Af Amer  Date Value Ref Range Status  12/31/2019 116  >59 mL/min/1.73 Final    Comment:    **In accordance with recommendations from the NKF-ASN Task force,**   [facility] is in the process of updating its eGFR calculation to the   2021 CKD-EPI creatinine equation that estimates kidney function   without a race variable.    eGFR  Date Value Ref Range Status  05/05/2021 95 >59 mL/min/1.73 Final   GFR calc non Af Amer  Date Value Ref Range Status  12/31/2019 101 >59 mL/min/1.73 Final         Passed - TSH in normal range and within 360 days    TSH  Date Value Ref Range Status  05/05/2021 3.110 0.450 - 4.500 uIU/mL Final         Passed - Completed PHQ-2 or PHQ-9 in the last 360 days      Passed - Last BP in normal range    BP Readings from Last 1 Encounters:  07/01/21 123/81         Passed - Last Heart Rate in normal range    Pulse Readings from Last 1 Encounters:  07/01/21 84         Passed - CBC within normal limits and completed in the last 12 months    WBC  Date Value Ref Range Status  05/05/2021 6.3 3.4 - 10.8 x10E3/uL Final  03/24/2019 9.5 4.0 - 10.5 K/uL Final   RBC  Date Value Ref Range Status  05/05/2021 5.12 3.77 - 5.28 x10E6/uL Final  03/24/2019 4.81 3.87 - 5.11 MIL/uL Final   Hemoglobin  Date Value Ref Range Status  05/05/2021 11.5 11.1 - 15.9 g/dL Final   Hematocrit  Date Value Ref Range Status  05/05/2021 36.5 34.0 - 46.6 % Final   MCHC  Date Value Ref Range Status  05/05/2021 31.5 31.5 - 35.7 g/dL Final  03/24/2019 31.1 30.0 - 36.0 g/dL Final   [facility]  Date Value Ref Range Status  05/05/2021 22.5 (L) 26.6 - 33.0 pg Final  03/24/2019 22.0 (L) 26.0 - 34.0 pg Final   MCV  Date Value Ref Range Status  05/05/2021 71 (L) 79 - 97 fL Final   No results found for: "PLTCOUNTKUC", "LABPLAT", "POCPLA" RDW  Date Value Ref Range Status  05/05/2021 14.9 11.7 - 15.4 % Final  FLUoxetine (PROZAC) 20 MG capsule 90 capsule 1    Sig: Take 3 capsules (60 mg total) by mouth daily.     Psychiatry:   Antidepressants - SSRI Failed - 02/03/2022  4:31 PM      Failed - Valid encounter within last 6 months    Recent Outpatient Visits           7 months ago Severe episode of recurrent major depressive disorder, without psychotic features ([place])   Primary Care at [facility], MD   8 months ago Severe episode of recurrent major depressive disorder, without psychotic features ([place])   Primary Care at [facility], Cari S, PA-C   9 months ago Severe episode of recurrent major depressive disorder, without psychotic features ([place])   Primary Care at [facility], Cari S, PA-C   9 months ago Hematuria, unspecified type   Primary Care at [facility], Cari S, PA-C   1 year ago Chronic hypertension during pregnancy   Primary Care at [facility], Bayard Beaver, MD              Passed - Completed PHQ-2 or PHQ-9 in the last 360 days       metFORMIN (GLUCOPHAGE-XR) 500 MG 24 hr tablet 90 tablet 0    Sig: Take 1 tablet (500 mg total) by mouth daily with breakfast.     Endocrinology:  Diabetes - Biguanides Failed - 02/03/2022  4:31 PM      Failed - HBA1C is between 0 and 7.9 and within 180 days    Hemoglobin A1C  Date Value Ref Range Status  05/05/2021 5.8 (A) 4.0 - 5.6 % Final   Hgb A1c MFr Bld  Date Value Ref Range Status  12/31/2019 5.7 (H) 4.8 - 5.6 % Final    Comment:             Prediabetes: 5.7 - 6.4          Diabetes: >6.4          Glycemic control for adults with diabetes: <7.0          Failed - B12 Level in normal range and within 720 days    No results found for: "VITAMINB12"       Failed - Valid encounter within last 6 months    Recent Outpatient Visits           7 months ago Severe episode of recurrent major depressive disorder, without psychotic features [facility])   Primary Care at [facility], MD   8 months ago Severe episode of recurrent major depressive disorder, without psychotic features [facility])    Primary Care at [facility], Cari S, PA-C   9 months ago Severe episode of recurrent major depressive disorder, without psychotic features ([place])   Primary Care at [facility], Cari S, PA-C   9 months ago Hematuria, unspecified type   Primary Care at [facility], Loraine Grip, PA-C   1 year ago Chronic hypertension during pregnancy   Primary Care at [facility], Bayard Beaver, MD              Passed - Cr in normal range and within 360 days    Creatinine, Ser  Date Value Ref Range Status  05/05/2021 0.86 0.57 - 1.00 mg/dL Final   Creatinine, Urine  Date Value Ref Range Status  03/23/2019 129.15 mg/dL Final         Passed -  eGFR in normal range and within 360 days    GFR calc Af Amer  Date Value Ref Range Status  12/31/2019 116 >59 mL/min/1.73 Final    Comment:    **In accordance with recommendations from the NKF-ASN Task force,**   [facility] is in the process of updating its eGFR calculation to the   2021 CKD-EPI creatinine equation that estimates kidney function   without a race variable.    GFR calc non Af Amer  Date Value Ref Range Status  12/31/2019 101 >59 mL/min/1.73 Final   eGFR  Date Value Ref Range Status  05/05/2021 95 >59 mL/min/1.73 Final         Passed - CBC within normal limits and completed in the last 12 months    WBC  Date Value Ref Range Status  05/05/2021 6.3 3.4 - 10.8 x10E3/uL Final  03/24/2019 9.5 4.0 - 10.5 K/uL Final   RBC  Date Value Ref Range Status  05/05/2021 5.12 3.77 - 5.28 x10E6/uL Final  03/24/2019 4.81 3.87 - 5.11 MIL/uL Final   Hemoglobin  Date Value Ref Range Status  05/05/2021 11.5 11.1 - 15.9 g/dL Final   Hematocrit  Date Value Ref Range Status  05/05/2021 36.5 34.0 - 46.6 % Final   MCHC  Date Value Ref Range Status  05/05/2021 31.5 31.5 - 35.7 g/dL Final  03/24/2019 31.1 30.0 - 36.0 g/dL Final   [facility]  Date Value Ref Range Status  05/05/2021 22.5 (L) 26.6 - 33.0 pg Final   03/24/2019 22.0 (L) 26.0 - 34.0 pg Final   MCV  Date Value Ref Range Status  05/05/2021 71 (L) 79 - 97 fL Final   No results found for: "PLTCOUNTKUC", "LABPLAT", "POCPLA" RDW  Date Value Ref Range Status  05/05/2021 14.9 11.7 - 15.4 % Final          traZODone (DESYREL) 50 MG tablet 30 tablet 1    Sig: Take 1 tablet (50 mg total) by mouth at bedtime.     Psychiatry: Antidepressants - Serotonin Modulator Failed - 02/03/2022  4:31 PM      Failed - Valid encounter within last 6 months    Recent Outpatient Visits           7 months ago Severe episode of recurrent major depressive disorder, without psychotic features [facility])   Primary Care at [facility], MD   8 months ago Severe episode of recurrent major depressive disorder, without psychotic features [facility])   Primary Care at [facility], Cari S, PA-C   9 months ago Severe episode of recurrent major depressive disorder, without psychotic features ([place])   Primary Care at [facility], Cari S, PA-C   9 months ago Hematuria, unspecified type   Primary Care at [facility], Loraine Grip, PA-C   1 year ago Chronic hypertension during pregnancy   Primary Care at [facility], Bayard Beaver, MD              Passed - Completed PHQ-2 or PHQ-9 in the last 360 days

## 2022-02-09 NOTE — Progress Notes (Deleted)
Erroneous encounter-disregard

## 2022-02-11 ENCOUNTER — Ambulatory Visit
Admission: EM | Admit: 2022-02-11 | Discharge: 2022-02-11 | Disposition: A | Discharge status: Home / Self Care | Payer: Medicaid Other | Attending: Physician Assistant | Admitting: Physician Assistant

## 2022-02-11 DIAGNOSIS — N764 Abscess of vulva: Secondary | ICD-10-CM

## 2022-02-11 MED ORDER — DOXYCYCLINE HYCLATE 100 MG PO CAPS: 100.0000 mg | ORAL_CAPSULE | Freq: Two times a day (BID) | ORAL | 0 refills | Status: DC

## 2022-02-11 NOTE — ED Provider Notes (Signed)
[facility]    CSN: [id number] Arrival date & time: 02/11/22  1535      History   Chief Complaint Chief Complaint  Patient presents with   vaginal lesion    HPI Katrina Stewart is a 27 y.o. female.   Patient here today for evaluation of a "bump" on her left labia that she first noticed about a week ago that seems to have gotten larger and more painful over the last 2 days.  She denies any drainage.  She has not had any fever.  She denies any nausea or vomiting.  The history is provided by the patient.    Past Medical History:  Diagnosis Date   Anxiety    COVID-19 virus infection 12/05/2018   + again on 2/4 (asymptomatic)  postiive test in [facility]U on 11-2   Depression    Depression    Phreesia 12/17/2019   Depression    Phreesia 12/30/2019   Essential hypertension    Gestational diabetes mellitus (GDM)    Normal postpartum 2 hr GTT   Group B streptococcal infection in pregnancy 03/13/2019   Rh negative state in antepartum period 09/03/2015   '[x]'$  needs rhogam at 28 weeks  [name] postive anti-D on initial lab. Patient received rhogam on 08/02/18 during [facility]U visit   Rubella non-immune status, antepartum 09/03/2015   Vaginal Pap smear, abnormal     Patient Active Problem List   Diagnosis Date Noted   Bipolar affective disorder, current episode mixed ([place]) 05/05/2021   Psychophysiological insomnia 05/05/2021   Prediabetes 05/05/2021   Class 3 severe obesity due to excess calories without serious comorbidity with body mass index (BMI) of 45.0 to 49.9 in adult [facility]) 05/05/2021   History of gestational diabetes 10/03/2019   Chronic hypertension during pregnancy 11/20/2018   History of depression 08/29/2018   Severe episode of recurrent major depressive disorder, without psychotic features ([place])    Alcohol use disorder     Past Surgical History:  Procedure Laterality Date   BREAST SURGERY  2013   Lt & Rt excision juvenile fibroadenoma   MASS EXCISION  10/29/2011    Procedure: EXCISION MASS;  Surgeon: Harl Bowie, MD;  Location: [facility];  Service: General;  Laterality: Bilateral;  excision of bilateral breast masses    OB History     Gravida  3   Para  2   Term  2   Preterm      AB  1   Living  2      SAB  1   IAB  0   Ectopic      Multiple  0   Live Births  2            Home Medications    Prior to Admission medications   Medication Sig Start Date End Date Taking? Authorizing Provider  doxycycline (VIBRAMYCIN) 100 MG capsule Take 1 capsule (100 mg total) by mouth 2 (two) times daily. 02/11/22  Yes Francene Finders, PA-C  ARIPiprazole (ABILIFY) 15 MG tablet Take 1 tablet (15 mg total) by mouth daily. 07/01/21 07/01/22  Dorna Mai, MD  FLUoxetine (PROZAC) 20 MG capsule Take 3 capsules (60 mg total) by mouth daily. 07/01/21 07/01/22  Dorna Mai, MD  metFORMIN (GLUCOPHAGE-XR) 500 MG 24 hr tablet Take 1 tablet (500 mg total) by mouth daily with breakfast. 11/13/21   Dorna Mai, MD  traZODone (DESYREL) 50 MG tablet Take 1 tablet (50 mg total) by mouth at bedtime. 07/01/21  Dorna Mai, MD  Vitamin D, Ergocalciferol, (DRISDOL) 1.25 MG (50000 UNIT) CAPS capsule Take 1 capsule (50,000 Units total) by mouth every 7 (seven) days. 05/06/21   Mayers, Loraine Grip, PA-C    Family History Family History  Problem Relation Age of Onset   Cancer Maternal Grandmother        lung cancer   Cancer Other        bladder cancer   Asthma Brother     Social History Social History   Tobacco Use   Smoking status: Never   Smokeless tobacco: Never  Vaping Use   Vaping Use: Never used  Substance Use Topics   Alcohol use: Not Currently    Alcohol/week: 2.0 - 3.0 standard drinks of alcohol    Types: 2 - 3 Shots of liquor per week    Comment: every weekend before pregnancy (4-5 drinks)   Drug use: No     Allergies   Patient has no known allergies.   Review of Systems Review of Systems  Constitutional:  Negative for chills  and fever.  Eyes:  Negative for discharge and redness.  Gastrointestinal:  Negative for nausea and vomiting.  Genitourinary:  Negative for genital sores.     Physical Exam Triage Vital Signs ED Triage Vitals  Enc Vitals Group     BP      Pulse      Resp      Temp      Temp src      SpO2      Weight      Height      Head Circumference      Peak Flow      Pain Score      Pain Loc      Pain Edu?      Excl. in [place]?    No data found.  Updated Vital Signs Pulse (!) 2   Temp 98.4 F (36.9 C) (Oral)   Resp 19   LMP 01/22/2022   SpO2 98%   Visual Acuity Right Eye Distance:   Left Eye Distance:   Bilateral Distance:    Right Eye Near:   Left Eye Near:    Bilateral Near:     Physical Exam Vitals and nursing note reviewed.  Constitutional:      General: She is not in acute distress.    Appearance: Normal appearance. She is not ill-appearing.  HENT:     Head: Normocephalic and atraumatic.  Eyes:     Conjunctiva/sclera: Conjunctivae normal.  Cardiovascular:     Rate and Rhythm: Normal rate.  Pulmonary:     Effort: Pulmonary effort is normal. No respiratory distress.  Skin:    Comments: Mild swelling diffusely to posterior left labia majora with mild TTP noted, no active drainage present  Neurological:     Mental Status: She is alert.  Psychiatric:        Mood and Affect: Mood normal.        Behavior: Behavior normal.        Thought Content: Thought content normal.      UC Treatments / Results  Labs (all labs ordered are listed, but only abnormal results are displayed) Labs Reviewed - No data to display  EKG   Radiology No results found.  Procedures Procedures (including critical care time)  Medications Ordered in UC Medications - No data to display  Initial Impression / Assessment and Plan / UC Course  I have reviewed the triage vital  signs and the nursing notes.  Pertinent labs & imaging results that were available during my care of the  patient were reviewed by me and considered in my medical decision making (see chart for details).    Suspect likely abscess of bartholin cyst. . Recommended doxycycline (patient reports she is not pregnant) and warm sitz baths with cool compresses to help with swelling and pain. Encouraged follow up with GYN (patient reports she is already established) if no gradual improvement or with any worsening.   Final Clinical Impressions(s) / UC Diagnoses   Final diagnoses:  Vulvar abscess   Discharge Instructions   None    ED Prescriptions     Medication Sig Dispense Auth. Provider   doxycycline (VIBRAMYCIN) 100 MG capsule Take 1 capsule (100 mg total) by mouth 2 (two) times daily. 20 capsule Francene Finders, PA-C      PDMP not reviewed this encounter.   Francene Finders, PA-C 02/11/22 1840

## 2022-02-11 NOTE — ED Triage Notes (Signed)
Pt presents to [facility] with co of L sided vaginal bumb she noticed 1 weerk ago but now has gotten worse over the last two days. Pt reports it is painful no drainage noted.

## 2022-02-12 ENCOUNTER — Encounter: Payer: Self-pay | Admitting: Family Medicine

## 2022-02-12 ENCOUNTER — Other Ambulatory Visit (HOSPITAL_COMMUNITY)
Admission: RE | Admit: 2022-02-12 | Discharge: 2022-02-12 | Disposition: A | Discharge status: Home / Self Care | Payer: Medicaid Other | Source: Ambulatory Visit | Attending: Family | Admitting: Family

## 2022-02-12 ENCOUNTER — Ambulatory Visit (INDEPENDENT_AMBULATORY_CARE_PROVIDER_SITE_OTHER): Payer: Medicaid Other | Admitting: Family Medicine

## 2022-02-12 VITALS — BP 133/88 | HR 82 | Temp 97.7°F | Resp 16 | Wt 293.0 lb

## 2022-02-12 DIAGNOSIS — Z202 Contact with and (suspected) exposure to infections with a predominantly sexual mode of transmission: Secondary | ICD-10-CM

## 2022-02-12 DIAGNOSIS — F332 Major depressive disorder, recurrent severe without psychotic features: Secondary | ICD-10-CM

## 2022-02-12 DIAGNOSIS — F5104 Psychophysiologic insomnia: Secondary | ICD-10-CM | POA: Diagnosis not present

## 2022-02-12 DIAGNOSIS — F319 Bipolar disorder, unspecified: Secondary | ICD-10-CM | POA: Insufficient documentation

## 2022-02-12 DIAGNOSIS — F3160 Bipolar disorder, current episode mixed, unspecified: Secondary | ICD-10-CM

## 2022-02-12 DIAGNOSIS — R7303 Prediabetes: Secondary | ICD-10-CM

## 2022-02-12 HISTORY — DX: Bipolar disorder, unspecified: F31.9

## 2022-02-12 MED ORDER — FLUOXETINE HCL 20 MG PO CAPS: 60.0000 mg | ORAL_CAPSULE | Freq: Every day | ORAL | 0 refills | Status: DC

## 2022-02-12 MED ORDER — TRAZODONE HCL 50 MG PO TABS: 50.0000 mg | ORAL_TABLET | Freq: Every day | ORAL | 0 refills | Status: DC

## 2022-02-12 MED ORDER — ARIPIPRAZOLE 15 MG PO TABS: 15.0000 mg | ORAL_TABLET | Freq: Every day | ORAL | 0 refills | Status: DC

## 2022-02-12 MED ORDER — METFORMIN HCL ER 500 MG PO TB24: 500.0000 mg | ORAL_TABLET | Freq: Every day | ORAL | 0 refills | Status: DC

## 2022-02-15 LAB — CERVICOVAGINAL ANCILLARY ONLY
Bacterial Vaginitis (gardnerella): POSITIVE — AB
Candida Glabrata: NEGATIVE
Candida Vaginitis: POSITIVE — AB
Chlamydia: NEGATIVE
Comment: NEGATIVE
Comment: NEGATIVE
Comment: NEGATIVE
Comment: NEGATIVE
Comment: NEGATIVE
Comment: NORMAL
Neisseria Gonorrhea: NEGATIVE
Trichomonas: NEGATIVE

## 2022-02-16 ENCOUNTER — Encounter: Payer: Self-pay | Admitting: Family Medicine

## 2022-02-16 ENCOUNTER — Other Ambulatory Visit: Payer: Self-pay | Admitting: Family Medicine

## 2022-02-16 MED ORDER — METRONIDAZOLE 500 MG PO TABS: 500.0000 mg | ORAL_TABLET | Freq: Two times a day (BID) | ORAL | 0 refills | Status: AC

## 2022-02-16 MED ORDER — FLUCONAZOLE 150 MG PO TABS: 150.0000 mg | ORAL_TABLET | Freq: Once | ORAL | 0 refills | Status: AC

## 2022-02-16 NOTE — Progress Notes (Signed)
Established Patient Office Visit  Subjective    Patient ID: Katrina Stewart, female    DOB: 03/11/1995  Age: 27 y.o. MRN: [id number]  CC:  Chief Complaint  Patient presents with   Medication Refill    HPI Katrina Stewart presents for follow up of chronic med issues with med refills. Patient has not been as consistent with follow up with [facility]. Patient also reports that she has had unprotected sexual intercourse and would like to be tested for possible STD.   Outpatient Encounter Medications as of 02/12/2022  Medication Sig   ARIPiprazole (ABILIFY) 15 MG tablet Take 1 tablet (15 mg total) by mouth daily.   doxycycline (VIBRAMYCIN) 100 MG capsule Take 1 capsule (100 mg total) by mouth 2 (two) times daily.   FLUoxetine (PROZAC) 20 MG capsule Take 3 capsules (60 mg total) by mouth daily.   metFORMIN (GLUCOPHAGE-XR) 500 MG 24 hr tablet Take 1 tablet (500 mg total) by mouth daily with breakfast.   traZODone (DESYREL) 50 MG tablet Take 1 tablet (50 mg total) by mouth at bedtime.   Vitamin D, Ergocalciferol, (DRISDOL) 1.25 MG (50000 UNIT) CAPS capsule Take 1 capsule (50,000 Units total) by mouth every 7 (seven) days.   [DISCONTINUED] ARIPiprazole (ABILIFY) 15 MG tablet Take 1 tablet (15 mg total) by mouth daily.   [DISCONTINUED] FLUoxetine (PROZAC) 20 MG capsule Take 3 capsules (60 mg total) by mouth daily.   [DISCONTINUED] metFORMIN (GLUCOPHAGE-XR) 500 MG 24 hr tablet Take 1 tablet (500 mg total) by mouth daily with breakfast.   [DISCONTINUED] traZODone (DESYREL) 50 MG tablet Take 1 tablet (50 mg total) by mouth at bedtime.   No facility-administered encounter medications on file as of 02/12/2022.    Past Medical History:  Diagnosis Date   Anxiety    COVID-19 virus infection 12/05/2018   + again on 2/4 (asymptomatic)  postiive test in [facility]U on 11-2   Depression    Depression    Phreesia 12/17/2019   Depression    Phreesia 12/30/2019   Essential hypertension    Gestational diabetes  mellitus (GDM)    Normal postpartum 2 hr GTT   Group B streptococcal infection in pregnancy 03/13/2019   Rh negative state in antepartum period 09/03/2015   '[x]'$  needs rhogam at 28 weeks  [name] postive anti-D on initial lab. Patient received rhogam on 08/02/18 during MAU visit   Rubella non-immune status, antepartum 09/03/2015   Vaginal Pap smear, abnormal     Past Surgical History:  Procedure Laterality Date   BREAST SURGERY  2013   Lt & Rt excision juvenile fibroadenoma   MASS EXCISION  10/29/2011   Procedure: EXCISION MASS;  Surgeon: Harl Bowie, MD;  Location: [facility];  Service: General;  Laterality: Bilateral;  excision of bilateral breast masses    Family History  Problem Relation Age of Onset   Cancer Maternal Grandmother        lung cancer   Cancer Other        bladder cancer   Asthma Brother     Social History   Socioeconomic History   Marital status: Single    Spouse name: Not on file   Number of children: Not on file   Years of education: Not on file   Highest education level: Not on file  Occupational History    Comment: unemployed  Tobacco Use   Smoking status: Never   Smokeless tobacco: Never  Vaping Use   Vaping Use: Never used  Substance and  Sexual Activity   Alcohol use: Not Currently    Alcohol/week: 2.0 - 3.0 standard drinks of alcohol    Types: 2 - 3 Shots of liquor per week    Comment: every weekend before pregnancy (4-5 drinks)   Drug use: No   Sexual activity: Not Currently    Birth control/protection: None  Other Topics Concern   Not on file  Social History Narrative   Not on file   Social Determinants of Health   Financial Resource Strain: Not on file  Food Insecurity: No Food Insecurity (08/29/2018)   Hunger Vital Sign    Worried About Running Out of Food in the Last Year: Never true    Ran Out of Food in the Last Year: Never true  Transportation Needs: Unmet Transportation Needs (08/29/2018)   PRAPARE - Radiographer, therapeutic (Medical): Yes    Lack of Transportation (Non-Medical): Yes  Physical Activity: Not on file  Stress: Not on file  Social Connections: Not on file  Intimate Partner Violence: Not At Risk (08/29/2018)   Humiliation, Afraid, Rape, and Kick questionnaire    Fear of Current or Ex-Partner: No    Emotionally Abused: No    Physically Abused: No    Sexually Abused: No    Review of Systems  Psychiatric/Behavioral:  Positive for depression. Negative for substance abuse and suicidal ideas. The patient has insomnia.   All other systems reviewed and are negative.       Objective    BP 133/88   Pulse 82   Temp 97.7 F (36.5 C) (Oral)   Resp 16   Wt 293 lb (132.9 kg)   LMP 01/22/2022   SpO2 97%   BMI 43.27 kg/m   Physical Exam Vitals and nursing note reviewed.  Constitutional:      General: She is not in acute distress. HENT:     Nose: Rhinorrhea: .Marland Kitchen  Cardiovascular:     Rate and Rhythm: Normal rate and regular rhythm.  Pulmonary:     Effort: Pulmonary effort is normal.     Breath sounds: Normal breath sounds.  Neurological:     General: No focal deficit present.     Mental Status: She is alert and oriented to person, place, and time.  Psychiatric:        Mood and Affect: Mood is depressed. Affect is flat.        Speech: Speech normal.        Behavior: Behavior normal. Behavior is cooperative.         Assessment & Plan:   1. Severe episode of recurrent major depressive disorder, without psychotic features ([place]) Meds refilled. Patient to schedule for follow up with [facility] - FLUoxetine (PROZAC) 20 MG capsule; Take 3 capsules (60 mg total) by mouth daily.  Dispense: 90 capsule; Refill: 0  2. Bipolar affective disorder, current episode mixed, current episode severity unspecified ([place]) Meds refilled. Patient to follow up with [facility] - ARIPiprazole (ABILIFY) 15 MG tablet; Take 1 tablet (15 mg total) by mouth daily.  Dispense: 90 tablet; Refill: 0  3.  Psychophysiological insomnia Meds refilled. Patient to follow up with [facility] - traZODone (DESYREL) 50 MG tablet; Take 1 tablet (50 mg total) by mouth at bedtime.  Dispense: 90 tablet; Refill: 0  4. Prediabetes Meds refilled.  - metFORMIN (GLUCOPHAGE-XR) 500 MG 24 hr tablet; Take 1 tablet (500 mg total) by mouth daily with breakfast.  Dispense: 90 tablet; Refill: 0  5. STD exposure  Denies sx. Swab results pending.  - Cervicovaginal ancillary only    Return in about 3 months (around 05/14/2022) for follow up.   Becky Sax, MD

## 2022-05-07 ENCOUNTER — Other Ambulatory Visit (HOSPITAL_COMMUNITY)
Admission: RE | Admit: 2022-05-07 | Discharge: 2022-05-07 | Disposition: A | Discharge status: Home / Self Care | Payer: Medicaid Other | Source: Ambulatory Visit | Attending: Family Medicine | Admitting: Family Medicine

## 2022-05-07 ENCOUNTER — Ambulatory Visit (INDEPENDENT_AMBULATORY_CARE_PROVIDER_SITE_OTHER): Payer: Medicaid Other | Admitting: Family Medicine

## 2022-05-07 ENCOUNTER — Encounter: Payer: Self-pay | Admitting: Family Medicine

## 2022-05-07 VITALS — BP 135/92 | HR 76 | Temp 98.1°F | Resp 16 | Wt 295.2 lb

## 2022-05-07 DIAGNOSIS — Z202 Contact with and (suspected) exposure to infections with a predominantly sexual mode of transmission: Secondary | ICD-10-CM

## 2022-05-07 DIAGNOSIS — F3160 Bipolar disorder, current episode mixed, unspecified: Secondary | ICD-10-CM

## 2022-05-07 DIAGNOSIS — Z6841 Body Mass Index (BMI) 40.0 and over, adult: Secondary | ICD-10-CM | POA: Diagnosis not present

## 2022-05-07 DIAGNOSIS — A64 Unspecified sexually transmitted disease: Secondary | ICD-10-CM

## 2022-05-07 DIAGNOSIS — R7303 Prediabetes: Secondary | ICD-10-CM | POA: Diagnosis not present

## 2022-05-07 DIAGNOSIS — F5104 Psychophysiologic insomnia: Secondary | ICD-10-CM | POA: Diagnosis not present

## 2022-05-07 LAB — POCT GLYCOSYLATED HEMOGLOBIN (HGB A1C): Hemoglobin A1C: 5.5 % (ref 4.0–5.6)

## 2022-05-07 MED ORDER — TRAZODONE HCL 50 MG PO TABS: 50.0000 mg | ORAL_TABLET | Freq: Every day | ORAL | 1 refills | Status: DC

## 2022-05-07 MED ORDER — ARIPIPRAZOLE 15 MG PO TABS: 15.0000 mg | ORAL_TABLET | Freq: Every day | ORAL | 0 refills | Status: DC

## 2022-05-07 MED ORDER — FLUOXETINE HCL 20 MG PO CAPS: 60.0000 mg | ORAL_CAPSULE | Freq: Every day | ORAL | 1 refills | Status: DC

## 2022-05-10 ENCOUNTER — Encounter: Payer: Self-pay | Admitting: Family Medicine

## 2022-05-10 LAB — CERVICOVAGINAL ANCILLARY ONLY
Bacterial Vaginitis (gardnerella): POSITIVE — AB
Candida Glabrata: NEGATIVE
Candida Vaginitis: NEGATIVE
Chlamydia: NEGATIVE
Comment: NEGATIVE
Comment: NEGATIVE
Comment: NEGATIVE
Comment: NEGATIVE
Comment: NEGATIVE
Comment: NORMAL
Neisseria Gonorrhea: NEGATIVE
Trichomonas: NEGATIVE

## 2022-05-10 NOTE — Progress Notes (Signed)
Established Patient Office Visit  Subjective    Patient ID: Katrina Stewart, female    DOB: 1995/04/30  Age: 27 y.o. MRN: [id number]  CC:  Chief Complaint  Patient presents with   Follow-up    HPI Katrina Stewart presents for routine follow up of chronic med issues. Patient also wants and STI check but denies symptoms.    Outpatient Encounter Medications as of 05/07/2022  Medication Sig   metFORMIN (GLUCOPHAGE-XR) 500 MG 24 hr tablet Take 1 tablet (500 mg total) by mouth daily with breakfast.   [DISCONTINUED] ARIPiprazole (ABILIFY) 15 MG tablet Take 1 tablet (15 mg total) by mouth daily.   [DISCONTINUED] doxycycline (VIBRAMYCIN) 100 MG capsule Take 1 capsule (100 mg total) by mouth 2 (two) times daily.   [DISCONTINUED] fluconazole (DIFLUCAN) 150 MG tablet Take 150 mg by mouth once.   [DISCONTINUED] FLUoxetine (PROZAC) 20 MG capsule Take 3 capsules (60 mg total) by mouth daily.   [DISCONTINUED] traZODone (DESYREL) 50 MG tablet Take 1 tablet (50 mg total) by mouth at bedtime.   [DISCONTINUED] Vitamin D, Ergocalciferol, (DRISDOL) 1.25 MG (50000 UNIT) CAPS capsule Take 1 capsule (50,000 Units total) by mouth every 7 (seven) days.   ARIPiprazole (ABILIFY) 15 MG tablet Take 1 tablet (15 mg total) by mouth daily.   FLUoxetine (PROZAC) 20 MG capsule Take 3 capsules (60 mg total) by mouth daily.   traZODone (DESYREL) 50 MG tablet Take 1 tablet (50 mg total) by mouth at bedtime.   No facility-administered encounter medications on file as of 05/07/2022.    Past Medical History:  Diagnosis Date   Anxiety    COVID-19 virus infection 12/05/2018   + again on 2/4 (asymptomatic)  postiive test in MAU on 11-2   Depression    Depression    Phreesia 12/17/2019   Depression    Phreesia 12/30/2019   Essential hypertension    Gestational diabetes mellitus (GDM)    Normal postpartum 2 hr GTT   Group B streptococcal infection in pregnancy 03/13/2019   Rh negative state in antepartum period 09/03/2015    [x]  needs rhogam at 28 weeks  [name] postive anti-D on initial lab. Patient received rhogam on 08/02/18 during MAU visit   Rubella non-immune status, antepartum 09/03/2015   Vaginal Pap smear, abnormal     Past Surgical History:  Procedure Laterality Date   BREAST SURGERY  2013   Lt & Rt excision juvenile fibroadenoma   MASS EXCISION  10/29/2011   Procedure: EXCISION MASS;  Surgeon: Shelly Rubenstein, MD;  Location: [facility];  Service: General;  Laterality: Bilateral;  excision of bilateral breast masses    Family History  Problem Relation Age of Onset   Cancer Maternal Grandmother        lung cancer   Cancer Other        bladder cancer   Asthma Brother     Social History   Socioeconomic History   Marital status: Single    Spouse name: Not on file   Number of children: Not on file   Years of education: Not on file   Highest education level: Some college, no degree  Occupational History    Comment: unemployed  Tobacco Use   Smoking status: Never   Smokeless tobacco: Never  Vaping Use   Vaping Use: Never used  Substance and Sexual Activity   Alcohol use: Not Currently    Alcohol/week: 2.0 - 3.0 standard drinks of alcohol    Types: 2 - 3 Shots  of liquor per week    Comment: every weekend before pregnancy (4-5 drinks)   Drug use: No   Sexual activity: Not Currently    Birth control/protection: None  Other Topics Concern   Not on file  Social History Narrative   Not on file   Social Determinants of Health   Financial Resource Strain: Low Risk  (05/06/2022)   Overall Financial Resource Strain (CARDIA)    Difficulty of Paying Living Expenses: Not hard at all  Food Insecurity: No Food Insecurity (05/06/2022)   Hunger Vital Sign    Worried About Running Out of Food in the Last Year: Never true    Ran Out of Food in the Last Year: Never true  Transportation Needs: No Transportation Needs (05/06/2022)   PRAPARE - Administrator, Civil Service (Medical): No     Lack of Transportation (Non-Medical): No  Physical Activity: Insufficiently Active (05/06/2022)   Exercise Vital Sign    Days of Exercise per Week: 3 days    Minutes of Exercise per Session: 20 min  Stress: Stress Concern Present (05/06/2022)   [employer] of Occupational Health - Occupational Stress Questionnaire    Feeling of Stress : Rather much  Social Connections: Moderately Isolated (05/06/2022)   Social Connection and Isolation Panel [NHANES]    Frequency of Communication with Friends and Family: More than three times a week    Frequency of Social Gatherings with Friends and Family: More than three times a week    Attends Religious Services: 1 to 4 times per year    Active Member of [employer] or Organizations: No    Attends Banker Meetings: Not on file    Marital Status: Never married  Intimate Partner Violence: Not At Risk (08/29/2018)   Humiliation, Afraid, Rape, and Kick questionnaire    Fear of Current or Ex-Partner: No    Emotionally Abused: No    Physically Abused: No    Sexually Abused: No    Review of Systems  All other systems reviewed and are negative.       Objective    BP (!) 135/92   Pulse 76   Temp 98.1 F (36.7 C) (Oral)   Resp 16   Wt 295 lb 3.2 oz (133.9 kg)   SpO2 98%   BMI 43.59 kg/m   Physical Exam Vitals and nursing note reviewed.  Constitutional:      General: She is not in acute distress. HENT:     Nose: Rhinorrhea: .Marland Kitchen  Cardiovascular:     Rate and Rhythm: Normal rate and regular rhythm.  Pulmonary:     Effort: Pulmonary effort is normal.     Breath sounds: Normal breath sounds.  Neurological:     General: No focal deficit present.     Mental Status: She is alert and oriented to person, place, and time.  Psychiatric:        Mood and Affect: Mood normal. Affect is flat.        Speech: Speech normal.        Behavior: Behavior normal. Behavior is cooperative.         Assessment & Plan:   1. Encounter for  assessment of STD exposure  - Cervicovaginal ancillary only  2. Prediabetes A1c is wnl. Will have trial off of metformin.  - POCT glycosylated hemoglobin (Hb A1C)  3. Bipolar affective disorder, current episode mixed, current episode severity unspecified Abilify refilled.  - ARIPiprazole (ABILIFY) 15 MG tablet; Take 1  tablet (15 mg total) by mouth daily.  Dispense: 90 tablet; Refill: 0  4. Psychophysiological insomnia Trazodone refilled.  - traZODone (DESYREL) 50 MG tablet; Take 1 tablet (50 mg total) by mouth at bedtime.  Dispense: 90 tablet; Refill: 1  5. Class 3 severe obesity due to excess calories with serious comorbidity and body mass index (BMI) of 40.0 to 44.9 in adult Discussed dietary and activity options  Return in about 6 months (around 11/06/2022) for follow up.   Tommie RaymondWilson, Monserratt Knezevic P, MD

## 2022-05-28 ENCOUNTER — Other Ambulatory Visit: Payer: Self-pay | Admitting: Family Medicine

## 2022-05-28 MED ORDER — METRONIDAZOLE 500 MG PO TABS: 500.0000 mg | ORAL_TABLET | Freq: Two times a day (BID) | ORAL | 0 refills | Status: AC

## 2022-07-07 ENCOUNTER — Ambulatory Visit
Admission: EM | Admit: 2022-07-07 | Discharge: 2022-07-07 | Disposition: A | Discharge status: Home / Self Care | Payer: Medicaid Other | Attending: Internal Medicine | Admitting: Internal Medicine

## 2022-07-07 DIAGNOSIS — M7989 Other specified soft tissue disorders: Secondary | ICD-10-CM | POA: Diagnosis not present

## 2022-07-07 LAB — POCT URINALYSIS DIP (MANUAL ENTRY)
Bilirubin, UA: NEGATIVE
Glucose, UA: NEGATIVE mg/dL
Ketones, POC UA: NEGATIVE mg/dL
Leukocytes, UA: NEGATIVE
Nitrite, UA: NEGATIVE
Protein Ur, POC: 100 mg/dL — AB
Spec Grav, UA: >=1.03 — AB (ref 1.010–1.025)
Urobilinogen, UA: 0.2 E.U./dL
pH, UA: 6 (ref 5.0–8.0)

## 2022-07-07 NOTE — ED Provider Notes (Signed)
[facility]    CSN: [id number] Arrival date & time: 07/07/22  1858      History   Chief Complaint Chief Complaint  Patient presents with   Leg Swelling    HPI Katrina Stewart is a 26 y.o. female.   Patient presents with bilateral foot swelling that started this morning upon awakening.  Patient denies any injury.  Reports it is mildly painful.  Denies chest pain, shortness of breath, headache, dizziness, nausea, vomiting.  Denies any previous history of this.  Denies any pertinent cardiac history.  Denies that she does a lot of prolonged sitting or standing on a daily basis.     Past Medical History:  Diagnosis Date   Anxiety    COVID-19 virus infection 12/05/2018   + again on 2/4 (asymptomatic)  postiive test in MAU on 11-2   Depression    Depression    Phreesia 12/17/2019   Depression    Phreesia 12/30/2019   Essential hypertension    Gestational diabetes mellitus (GDM)    Normal postpartum 2 hr GTT   Group B streptococcal infection in pregnancy 03/13/2019   Rh negative state in antepartum period 09/03/2015   [x]  needs rhogam at 28 weeks  [name] postive anti-D on initial lab. Patient received rhogam on 08/02/18 during MAU visit   Rubella non-immune status, antepartum 09/03/2015   Vaginal Pap smear, abnormal     Patient Active Problem List   Diagnosis Date Noted   Bipolar disorder (manic depression) (HCC) 02/12/2022   Bipolar affective disorder, current episode mixed (HCC) 05/05/2021   Psychophysiological insomnia 05/05/2021   Prediabetes 05/05/2021   Class 3 severe obesity due to excess calories without serious comorbidity with body mass index (BMI) of 45.0 to 49.9 in adult [facility]) 05/05/2021   History of gestational diabetes 10/03/2019   Chronic hypertension during pregnancy 11/20/2018   History of depression 08/29/2018   Severe episode of recurrent major depressive disorder, without psychotic features (HCC)    Alcohol use disorder     Past Surgical  History:  Procedure Laterality Date   BREAST SURGERY  2013   Lt & Rt excision juvenile fibroadenoma   MASS EXCISION  10/29/2011   Procedure: EXCISION MASS;  Surgeon: Shelly Rubenstein, MD;  Location: [facility];  Service: General;  Laterality: Bilateral;  excision of bilateral breast masses    OB History     Gravida  3   Para  2   Term  2   Preterm      AB  1   Living  2      SAB  1   IAB  0   Ectopic      Multiple  0   Live Births  2            Home Medications    Prior to Admission medications   Medication Sig Start Date End Date Taking? Authorizing Provider  ARIPiprazole (ABILIFY) 15 MG tablet Take 1 tablet (15 mg total) by mouth daily. 05/07/22 05/07/23  Georganna Skeans, MD  FLUoxetine (PROZAC) 20 MG capsule Take 3 capsules (60 mg total) by mouth daily. 05/07/22 05/07/23  Georganna Skeans, MD  metFORMIN (GLUCOPHAGE-XR) 500 MG 24 hr tablet Take 1 tablet (500 mg total) by mouth daily with breakfast. 02/12/22   Georganna Skeans, MD  traZODone (DESYREL) 50 MG tablet Take 1 tablet (50 mg total) by mouth at bedtime. 05/07/22   Georganna Skeans, MD    Family History Family History  Problem Relation  Age of Onset   Cancer Maternal Grandmother        lung cancer   Cancer Other        bladder cancer   Asthma Brother     Social History Social History   Tobacco Use   Smoking status: Never   Smokeless tobacco: Never  Vaping Use   Vaping Use: Never used  Substance Use Topics   Alcohol use: Not Currently    Alcohol/week: 2.0 - 3.0 standard drinks of alcohol    Types: 2 - 3 Shots of liquor per week    Comment: every weekend before pregnancy (4-5 drinks)   Drug use: No     Allergies   Patient has no known allergies.   Review of Systems Review of Systems Per HPI  Physical Exam Triage Vital Signs ED Triage Vitals  Enc Vitals Group     BP 07/07/22 1909 (!) 138/93     Pulse Rate 07/07/22 1909 89     Resp 07/07/22 1909 16     Temp 07/07/22 1909 98.4 F (36.9 C)      Temp Source 07/07/22 1909 Oral     SpO2 07/07/22 1909 97 %     Weight --      Height --      Head Circumference --      Peak Flow --      Pain Score 07/07/22 1910 8     Pain Loc --      Pain Edu? --      Excl. in GC? --    No data found.  Updated Vital Signs BP (!) 138/93 (BP Location: Left Arm)   Pulse 89   Temp 98.4 F (36.9 C) (Oral)   Resp 16   LMP 07/03/2022 (Exact Date)   SpO2 97%   Visual Acuity Right Eye Distance:   Left Eye Distance:   Bilateral Distance:    Right Eye Near:   Left Eye Near:    Bilateral Near:     Physical Exam Constitutional:      General: She is not in acute distress.    Appearance: Normal appearance. She is not toxic-appearing or diaphoretic.  HENT:     Head: Normocephalic and atraumatic.  Eyes:     Extraocular Movements: Extraocular movements intact.     Conjunctiva/sclera: Conjunctivae normal.  Cardiovascular:     Rate and Rhythm: Normal rate and regular rhythm.     Pulses: Normal pulses.     Heart sounds: Normal heart sounds.  Pulmonary:     Effort: Pulmonary effort is normal. No respiratory distress.     Breath sounds: Normal breath sounds. No stridor. No wheezing, rhonchi or rales.  Feet:     Comments: Has nonpitting edema present to bilateral feet.  Swelling slightly extends up left ankle.  No discoloration or abrasions noted.  Patient can wiggle toes.  Capillary refill and pulses are intact. Neurological:     General: No focal deficit present.     Mental Status: She is alert and oriented to person, place, and time. Mental status is at baseline.  Psychiatric:        Mood and Affect: Mood normal.        Behavior: Behavior normal.        Thought Content: Thought content normal.        Judgment: Judgment normal.      UC Treatments / Results  Labs (all labs ordered are listed, but only abnormal results are displayed) Labs  Reviewed  POCT URINALYSIS DIP (MANUAL ENTRY) - Abnormal; Notable for the following components:       Result Value   Color, UA other (*)    Clarity, UA cloudy (*)    Spec Grav, UA >=1.030 (*)    Blood, UA large (*)    Protein Ur, POC =100 (*)    All other components within normal limits  CBC  BASIC METABOLIC PANEL  TSH    EKG   Radiology No results found.  Procedures Procedures (including critical care time)  Medications Ordered in UC Medications - No data to display  Initial Impression / Assessment and Plan / UC Course  I have reviewed the triage vital signs and the nursing notes.  Pertinent labs & imaging results that were available during my care of the patient were reviewed by me and considered in my medical decision making (see chart for details).     No concern for DVT or CHF given lung sounds are normal and swelling is bilateral.  Lung sounds are normal so do not think that emergent evaluation is necessary.  Advised patient to elevate extremities and apply compression stockings.  UA was unremarkable as patient is on her menstrual cycle so UA abnormalities appear to be related to this.  Will obtain BMP, CBC, TSH. Do not have capabilities to do BNP. advised patient to go to the ER if any symptoms significantly worsen and to follow-up with PCP.  Patient verbalized understanding and was agreeable with plan. Final Clinical Impressions(s) / UC Diagnoses   Final diagnoses:  Bilateral swelling of feet     Discharge Instructions      Urine was unremarkable.  Blood work is pending.  Will call if it is abnormal.  Please elevate extremities and apply compression stockings.  Follow-up if any symptoms persist or worsen.  Follow-up with family medicine doctor as well.     ED Prescriptions   None    PDMP not reviewed this encounter.   Gustavus Bryant, [place] 07/07/22 [phone]

## 2022-07-07 NOTE — ED Triage Notes (Signed)
Pt states swelling  and pain to both lower legs and feet since this morning.

## 2022-07-07 NOTE — Discharge Instructions (Signed)
Urine was unremarkable.  Blood work is pending.  Will call if it is abnormal.  Please elevate extremities and apply compression stockings.  Follow-up if any symptoms persist or worsen.  Follow-up with family medicine doctor as well.

## 2022-07-09 LAB — BASIC METABOLIC PANEL
BUN/Creatinine Ratio: 14 (ref 9–23)
BUN: 15 mg/dL (ref 6–20)
CO2: 21 mmol/L (ref 20–29)
Calcium: 9.6 mg/dL (ref 8.7–10.2)
Chloride: 107 mmol/L — ABNORMAL HIGH (ref 96–106)
Creatinine, Ser: 1.07 mg/dL — ABNORMAL HIGH (ref 0.57–1.00)
Glucose: 85 mg/dL (ref 70–99)
Potassium: 3.8 mmol/L (ref 3.5–5.2)
Sodium: 142 mmol/L (ref 134–144)
eGFR: 72 mL/min/{1.73_m2} (ref >59)

## 2022-07-09 LAB — TSH: TSH: 1.19 u[IU]/mL (ref 0.450–4.500)

## 2022-07-09 LAB — CBC
Hematocrit: 35.3 % (ref 34.0–46.6)
Hemoglobin: 10 g/dL — ABNORMAL LOW (ref 11.1–15.9)
MCH: 20 pg — ABNORMAL LOW (ref 26.6–33.0)
MCHC: 28.3 g/dL — ABNORMAL LOW (ref 31.5–35.7)
MCV: 71 fL — ABNORMAL LOW (ref 79–97)
Platelets: 375 10*3/uL (ref 150–450)
RBC: 4.99 x10E6/uL (ref 3.77–5.28)
RDW: 17 % — ABNORMAL HIGH (ref 11.7–15.4)
WBC: 8.3 10*3/uL (ref 3.4–10.8)

## 2022-07-15 NOTE — Progress Notes (Signed)
Patient ID: Katrina Stewart, female    DOB: 11-02-1995  MRN: [id number]  CC: Kidney Function Labs  Subjective: Katrina Stewart is a 27 y.o. female who presents for kidney function labs.   Her concerns today include:  07/07/2022 [facility]) per NP note: Initial Impression / Assessment and Plan / UC Course  I have reviewed the triage vital signs and the nursing notes.   Pertinent labs & imaging results that were available during my care of the patient were reviewed by me and considered in my medical decision making (see chart for details).     No concern for DVT or CHF given lung sounds are normal and swelling is bilateral.  Lung sounds are normal so do not think that emergent evaluation is necessary.  Advised patient to elevate extremities and apply compression stockings.  UA was unremarkable as patient is on her menstrual cycle so UA abnormalities appear to be related to this.  Will obtain BMP, CBC, TSH. Do not have capabilities to do BNP. advised patient to go to the ER if any symptoms significantly worsen and to follow-up with PCP.  Patient verbalized understanding and was agreeable with plan.  Today's visit 07/16/2022: - Reports since Urgent Care visit bilateral feet swelling resolved. Intermittent left flank pain since Urgent Care visit. She denies associated red flag symptoms. Would like kidney labs rechecked.  - STD screening. She denies associated symptoms.  - Today patient denies thoughts of self-harm, suicidal ideations, homicidal ideations. Reports she has had thoughts in the past. She would like a referral to therapist.  - No further issues/concerns for discussion today.   Patient Active Problem List   Diagnosis Date Noted   Bipolar disorder (manic depression) (HCC) 02/12/2022   Bipolar affective disorder, current episode mixed (HCC) 05/05/2021   Psychophysiological insomnia 05/05/2021   Prediabetes 05/05/2021   Class 3 severe  obesity due to excess calories without serious comorbidity with body mass index (BMI) of 45.0 to 49.9 in adult [facility]) 05/05/2021   History of gestational diabetes 10/03/2019   Chronic hypertension during pregnancy 11/20/2018   History of depression 08/29/2018   Severe episode of recurrent major depressive disorder, without psychotic features (HCC)    Alcohol use disorder      Current Outpatient Medications on File Prior to Visit  Medication Sig Dispense Refill   ARIPiprazole (ABILIFY) 15 MG tablet Take 1 tablet (15 mg total) by mouth daily. 90 tablet 0   FLUoxetine (PROZAC) 20 MG capsule Take 3 capsules (60 mg total) by mouth daily. 90 capsule 1   metFORMIN (GLUCOPHAGE-XR) 500 MG 24 hr tablet Take 1 tablet (500 mg total) by mouth daily with breakfast. 90 tablet 0   traZODone (DESYREL) 50 MG tablet Take 1 tablet (50 mg total) by mouth at bedtime. 90 tablet 1   No current facility-administered medications on file prior to visit.    No Known Allergies  Social History   Socioeconomic History   Marital status: Single    Spouse name: Not on file   Number of children: Not on file   Years of education: Not on file   Highest education level: Some college, no degree  Occupational History    Comment: unemployed  Tobacco Use   Smoking status: Never    Passive exposure: Never   Smokeless tobacco: Never  Vaping Use   Vaping Use: Never used  Substance and Sexual Activity   Alcohol use: Not Currently    Alcohol/week:  2.0 - 3.0 standard drinks of alcohol    Types: 2 - 3 Shots of liquor per week    Comment: every weekend before pregnancy (4-5 drinks)   Drug use: No   Sexual activity: Not Currently    Birth control/protection: None  Other Topics Concern   Not on file  Social History Narrative   Not on file   Social Determinants of Health   Financial Resource Strain: Low Risk  (05/06/2022)   Overall Financial Resource Strain (CARDIA)    Difficulty of Paying Living Expenses: Not hard at  all  Food Insecurity: No Food Insecurity (05/06/2022)   Hunger Vital Sign    Worried About Running Out of Food in the Last Year: Never true    Ran Out of Food in the Last Year: Never true  Transportation Needs: No Transportation Needs (05/06/2022)   PRAPARE - Administrator, Civil Service (Medical): No    Lack of Transportation (Non-Medical): No  Physical Activity: Insufficiently Active (05/06/2022)   Exercise Vital Sign    Days of Exercise per Week: 3 days    Minutes of Exercise per Session: 20 min  Stress: Stress Concern Present (05/06/2022)   [employer] of Occupational Health - Occupational Stress Questionnaire    Feeling of Stress : Rather much  Social Connections: Moderately Isolated (05/06/2022)   Social Connection and Isolation Panel [NHANES]    Frequency of Communication with Friends and Family: More than three times a week    Frequency of Social Gatherings with Friends and Family: More than three times a week    Attends Religious Services: 1 to 4 times per year    Active Member of [employer] or Organizations: No    Attends Banker Meetings: Not on file    Marital Status: Never married  Intimate Partner Violence: Not At Risk (08/29/2018)   Humiliation, Afraid, Rape, and Kick questionnaire    Fear of Current or Ex-Partner: No    Emotionally Abused: No    Physically Abused: No    Sexually Abused: No    Family History  Problem Relation Age of Onset   Cancer Maternal Grandmother        lung cancer   Cancer Other        bladder cancer   Asthma Brother     Past Surgical History:  Procedure Laterality Date   BREAST SURGERY  2013   Lt & Rt excision juvenile fibroadenoma   MASS EXCISION  10/29/2011   Procedure: EXCISION MASS;  Surgeon: Shelly Rubenstein, MD;  Location: [facility];  Service: General;  Laterality: Bilateral;  excision of bilateral breast masses    ROS: Review of Systems Negative except as stated above  PHYSICAL EXAM: BP 135/80   Pulse 91    Temp 98.2 F (36.8 C) (Oral)   Ht 5\' 8"  (1.727 m)   Wt (!) 307 lb 6.4 oz (139.4 kg)   LMP 07/03/2022 (Exact Date)   SpO2 95%   BMI 46.74 kg/m   Physical Exam HENT:     Head: Normocephalic and atraumatic.     Nose: Nose normal.     Mouth/Throat:     Mouth: Mucous membranes are moist.     Pharynx: Oropharynx is clear.  Eyes:     Extraocular Movements: Extraocular movements intact.     Conjunctiva/sclera: Conjunctivae normal.     Pupils: Pupils are equal, round, and reactive to light.  Cardiovascular:     Rate and Rhythm: Normal rate  and regular rhythm.     Pulses: Normal pulses.     Heart sounds: Normal heart sounds.  Pulmonary:     Effort: Pulmonary effort is normal.     Breath sounds: Normal breath sounds.  Musculoskeletal:     Right shoulder: Normal.     Left shoulder: Normal.     Right upper arm: Normal.     Left upper arm: Normal.     Right elbow: Normal.     Left elbow: Normal.     Right forearm: Normal.     Left forearm: Normal.     Right wrist: Normal.     Left wrist: Normal.     Right hand: Normal.     Left hand: Normal.     Cervical back: Normal, normal range of motion and neck supple.     Thoracic back: Normal.     Lumbar back: Normal.     Right hip: Normal.     Left hip: Normal.     Right upper leg: Normal.     Left upper leg: Normal.     Right knee: Normal.     Left knee: Normal.     Right lower leg: Normal.     Left lower leg: Normal.     Right ankle: Normal.     Left ankle: Normal.     Right foot: Normal.     Left foot: Normal.  Neurological:     General: No focal deficit present.     Mental Status: She is alert and oriented to person, place, and time.  Psychiatric:        Mood and Affect: Mood normal.        Behavior: Behavior normal.      ASSESSMENT AND PLAN: 1. Bilateral swelling of feet - Resolved.  2. Acute left flank pain - Routine screening.  - CMP14+EGFR - CBC - Amylase - Lipase - POCT urine pregnancy; Future - POCT  URINALYSIS DIP (CLINITEK)  3. Routine screening for STI (sexually transmitted infection) - Routine screening.  - Cervicovaginal ancillary only  4. Encounter for screening for HIV - Routine screening.  - HIV antibody (with reflex)  5. Bipolar affective disorder, current episode mixed, current episode severity unspecified (HCC) 6. Psychophysiological insomnia 7. Severe episode of recurrent major depressive disorder, without psychotic features (HCC) - Patient denies thoughts of self-harm, suicidal ideations, homicidal ideations. - Continue present management. No refills needed as of present.  - Referral to Psychiatry for further evaluation/management. During the interim follow-up with primary provider as scheduled.  - Ambulatory referral to Psychiatry    Patient was given the opportunity to ask questions.  Patient verbalized understanding of the plan and was able to repeat key elements of the plan. Patient was given clear instructions to go to Emergency Department or return to medical center if symptoms don't improve, worsen, or new problems develop.The patient verbalized understanding.   Orders Placed This Encounter  Procedures   CMP14+EGFR   CBC   Amylase   Lipase   HIV antibody (with reflex)   Ambulatory referral to Psychiatry   POCT urine pregnancy   POCT URINALYSIS DIP (CLINITEK)     Return in about 4 weeks (around 08/13/2022) for Follow-Up or next available Georganna Skeans, MD.  Rema Fendt, NP

## 2022-07-16 ENCOUNTER — Encounter: Payer: Self-pay | Admitting: Family

## 2022-07-16 ENCOUNTER — Other Ambulatory Visit (HOSPITAL_COMMUNITY)
Admission: RE | Admit: 2022-07-16 | Discharge: 2022-07-16 | Disposition: A | Discharge status: Home / Self Care | Payer: Medicaid Other | Source: Ambulatory Visit | Attending: Family | Admitting: Family

## 2022-07-16 ENCOUNTER — Ambulatory Visit (INDEPENDENT_AMBULATORY_CARE_PROVIDER_SITE_OTHER): Payer: Medicaid Other | Admitting: Family

## 2022-07-16 VITALS — BP 135/80 | HR 91 | Temp 98.2°F | Ht 68.0 in | Wt 307.4 lb

## 2022-07-16 DIAGNOSIS — M7989 Other specified soft tissue disorders: Secondary | ICD-10-CM | POA: Diagnosis not present

## 2022-07-16 DIAGNOSIS — F5104 Psychophysiologic insomnia: Secondary | ICD-10-CM

## 2022-07-16 DIAGNOSIS — Z7984 Long term (current) use of oral hypoglycemic drugs: Secondary | ICD-10-CM

## 2022-07-16 DIAGNOSIS — F3160 Bipolar disorder, current episode mixed, unspecified: Secondary | ICD-10-CM | POA: Diagnosis not present

## 2022-07-16 DIAGNOSIS — Z114 Encounter for screening for human immunodeficiency virus [HIV]: Secondary | ICD-10-CM

## 2022-07-16 DIAGNOSIS — Z113 Encounter for screening for infections with a predominantly sexual mode of transmission: Secondary | ICD-10-CM

## 2022-07-16 DIAGNOSIS — F332 Major depressive disorder, recurrent severe without psychotic features: Secondary | ICD-10-CM

## 2022-07-16 DIAGNOSIS — Z3202 Encounter for pregnancy test, result negative: Secondary | ICD-10-CM

## 2022-07-16 DIAGNOSIS — R109 Unspecified abdominal pain: Secondary | ICD-10-CM | POA: Diagnosis not present

## 2022-07-16 LAB — POCT URINE PREGNANCY: Preg Test, Ur: NEGATIVE

## 2022-07-16 LAB — POCT URINALYSIS DIP (CLINITEK)
Bilirubin, UA: NEGATIVE
Glucose, UA: NEGATIVE mg/dL
Ketones, POC UA: NEGATIVE mg/dL
Leukocytes, UA: NEGATIVE
Nitrite, UA: NEGATIVE
POC PROTEIN,UA: NEGATIVE
Spec Grav, UA: >=1.03 — AB (ref 1.010–1.025)
Urobilinogen, UA: 0.2 E.U./dL
pH, UA: 6.5 (ref 5.0–8.0)

## 2022-07-16 NOTE — Progress Notes (Signed)
PT states urgent care kidney results were abnormal  PT wants to try and get more labs for this  PT asking for STD swab

## 2022-07-17 LAB — CBC
Hematocrit: 36 % (ref 34.0–46.6)
Hemoglobin: 10.6 g/dL — ABNORMAL LOW (ref 11.1–15.9)
MCH: 19.9 pg — ABNORMAL LOW (ref 26.6–33.0)
MCHC: 29.4 g/dL — ABNORMAL LOW (ref 31.5–35.7)
MCV: 68 fL — ABNORMAL LOW (ref 79–97)
Platelets: 422 10*3/uL (ref 150–450)
RBC: 5.33 x10E6/uL — ABNORMAL HIGH (ref 3.77–5.28)
RDW: 16.7 % — ABNORMAL HIGH (ref 11.7–15.4)
WBC: 6.4 10*3/uL (ref 3.4–10.8)

## 2022-07-17 LAB — CMP14+EGFR
ALT: 14 IU/L (ref 0–32)
AST: 15 IU/L (ref 0–40)
Albumin: 4.5 g/dL (ref 4.0–5.0)
Alkaline Phosphatase: 71 IU/L (ref 44–121)
BUN/Creatinine Ratio: 12 (ref 9–23)
BUN: 11 mg/dL (ref 6–20)
Bilirubin Total: 0.5 mg/dL (ref 0.0–1.2)
CO2: 22 mmol/L (ref 20–29)
Calcium: 9.6 mg/dL (ref 8.7–10.2)
Chloride: 103 mmol/L (ref 96–106)
Creatinine, Ser: 0.89 mg/dL (ref 0.57–1.00)
Globulin, Total: 3.1 g/dL (ref 1.5–4.5)
Glucose: 86 mg/dL (ref 70–99)
Potassium: 4.1 mmol/L (ref 3.5–5.2)
Sodium: 138 mmol/L (ref 134–144)
Total Protein: 7.6 g/dL (ref 6.0–8.5)
eGFR: 90 mL/min/{1.73_m2} (ref >59)

## 2022-07-17 LAB — HIV ANTIBODY (ROUTINE TESTING W REFLEX): HIV Screen 4th Generation wRfx: NONREACTIVE

## 2022-07-17 LAB — AMYLASE: Amylase: 40 U/L (ref 31–110)

## 2022-07-17 LAB — LIPASE: Lipase: 31 U/L (ref 14–72)

## 2022-07-19 ENCOUNTER — Other Ambulatory Visit: Payer: Self-pay | Admitting: Family

## 2022-07-19 DIAGNOSIS — N760 Acute vaginitis: Secondary | ICD-10-CM | POA: Insufficient documentation

## 2022-07-19 DIAGNOSIS — B9689 Other specified bacterial agents as the cause of diseases classified elsewhere: Secondary | ICD-10-CM

## 2022-07-19 HISTORY — DX: Acute vaginitis: B96.89

## 2022-07-19 LAB — CERVICOVAGINAL ANCILLARY ONLY
Bacterial Vaginitis (gardnerella): POSITIVE — AB
Candida Glabrata: NEGATIVE
Candida Vaginitis: NEGATIVE
Chlamydia: NEGATIVE
Comment: NEGATIVE
Comment: NEGATIVE
Comment: NEGATIVE
Comment: NEGATIVE
Comment: NEGATIVE
Comment: NORMAL
Neisseria Gonorrhea: NEGATIVE
Trichomonas: NEGATIVE

## 2022-07-19 MED ORDER — METRONIDAZOLE 500 MG PO TABS: 500.0000 mg | ORAL_TABLET | Freq: Two times a day (BID) | ORAL | 0 refills | Status: AC

## 2022-08-13 ENCOUNTER — Ambulatory Visit: Payer: Medicaid Other | Admitting: Family Medicine

## 2022-08-16 ENCOUNTER — Encounter: Payer: Self-pay | Admitting: Family Medicine

## 2022-08-16 ENCOUNTER — Ambulatory Visit (INDEPENDENT_AMBULATORY_CARE_PROVIDER_SITE_OTHER): Payer: Medicaid Other | Admitting: Family Medicine

## 2022-08-16 VITALS — BP 138/87 | HR 90 | Temp 97.7°F | Resp 16 | Wt 316.0 lb

## 2022-08-16 DIAGNOSIS — Z6841 Body Mass Index (BMI) 40.0 and over, adult: Secondary | ICD-10-CM | POA: Diagnosis not present

## 2022-08-16 DIAGNOSIS — M7989 Other specified soft tissue disorders: Secondary | ICD-10-CM

## 2022-08-16 DIAGNOSIS — Z7689 Persons encountering health services in other specified circumstances: Secondary | ICD-10-CM | POA: Diagnosis not present

## 2022-08-16 MED ORDER — PHENTERMINE HCL 37.5 MG PO TABS: 37.5000 mg | ORAL_TABLET | Freq: Every day | ORAL | 1 refills | Status: DC

## 2022-08-16 NOTE — Progress Notes (Signed)
Patient said  her feet is better. Patient said there is no more swelling Patient has no other concerns today

## 2022-08-19 ENCOUNTER — Encounter: Payer: Self-pay | Admitting: Family Medicine

## 2022-08-19 NOTE — Progress Notes (Signed)
Established Patient Office Visit  Subjective    Patient ID: Katrina Stewart, female    DOB: [birth year]  Age: 27 y.o. MRN: [id number]  CC: No chief complaint on file.   HPI Katrina Stewart presents for follow up of feet pain/swelling and for weight management. Patient reports that her sx have improved/resolved.    Outpatient Encounter Medications as of 08/16/2022  Medication Sig   ARIPiprazole (ABILIFY) 15 MG tablet Take 1 tablet (15 mg total) by mouth daily.   FLUoxetine (PROZAC) 20 MG capsule Take 3 capsules (60 mg total) by mouth daily.   metFORMIN (GLUCOPHAGE-XR) 500 MG 24 hr tablet Take 1 tablet (500 mg total) by mouth daily with breakfast.   phentermine (ADIPEX-P) 37.5 MG tablet Take 1 tablet (37.5 mg total) by mouth daily before breakfast.   traZODone (DESYREL) 50 MG tablet Take 1 tablet (50 mg total) by mouth at bedtime. (Patient not taking: Reported on 08/16/2022)   No facility-administered encounter medications on file as of 08/16/2022.    Past Medical History:  Diagnosis Date   Anxiety    COVID-19 virus infection 12/05/2018   + again on 2/4 (asymptomatic)  postiive test in [facility] on 11-2   Depression    Depression    Phreesia 12/17/2019   Depression    Phreesia 12/30/2019   Essential hypertension    Gestational diabetes mellitus (GDM)    Normal postpartum 2 hr GTT   Group B streptococcal infection in pregnancy 03/13/2019   Rh negative state in antepartum period 09/03/2015   [x]  needs rhogam at 28 weeks  [name] postive anti-D on initial lab. Patient received rhogam on 08/02/18 during [facility]U visit   Rubella non-immune status, antepartum 09/03/2015   Vaginal Pap smear, abnormal     Past Surgical History:  Procedure Laterality Date   BREAST SURGERY  2013   Lt & Rt excision juvenile fibroadenoma   MASS EXCISION  10/29/2011   Procedure: EXCISION MASS;  Surgeon: Shelly Rubenstein, MD;  Location: [facility];  Service: General;  Laterality: Bilateral;  excision of bilateral breast  masses    Family History  Problem Relation Age of Onset   Cancer Maternal Grandmother        lung cancer   Cancer Other        bladder cancer   Asthma Brother     Social History   Socioeconomic History   Marital status: Single    Spouse name: Not on file   Number of children: Not on file   Years of education: Not on file   Highest education level: Some college, no degree  Occupational History    Comment: unemployed  Tobacco Use   Smoking status: Never    Passive exposure: Never   Smokeless tobacco: Never  Vaping Use   Vaping status: Never Used  Substance and Sexual Activity   Alcohol use: Not Currently    Alcohol/week: 2.0 - 3.0 standard drinks of alcohol    Types: 2 - 3 Shots of liquor per week    Comment: every weekend before pregnancy (4-5 drinks)   Drug use: No   Sexual activity: Not Currently    Birth control/protection: None  Other Topics Concern   Not on file  Social History Narrative   Not on file   Social Determinants of Health   Financial Resource Strain: Low Risk  (05/06/2022)   Overall Financial Resource Strain (CARDIA)    Difficulty of Paying Living Expenses: Not hard at all  Food Insecurity: No  Food Insecurity (05/06/2022)   Hunger Vital Sign    Worried About Running Out of Food in the Last Year: Never true    Ran Out of Food in the Last Year: Never true  Transportation Needs: No Transportation Needs (05/06/2022)   PRAPARE - Administrator, Civil Service (Medical): No    Lack of Transportation (Non-Medical): No  Physical Activity: Insufficiently Active (05/06/2022)   Exercise Vital Sign    Days of Exercise per Week: 3 days    Minutes of Exercise per Session: 20 min  Stress: Stress Concern Present (05/06/2022)   [employer] of Occupational Health - Occupational Stress Questionnaire    Feeling of Stress : Rather much  Social Connections: Moderately Isolated (05/06/2022)   Social Connection and Isolation Panel [NHANES]    Frequency of  Communication with Friends and Family: More than three times a week    Frequency of Social Gatherings with Friends and Family: More than three times a week    Attends Religious Services: 1 to 4 times per year    Active Member of [employer] or Organizations: No    Attends Banker Meetings: Not on file    Marital Status: Never married  Intimate Partner Violence: Not At Risk (08/29/2018)   Humiliation, Afraid, Rape, and Kick questionnaire    Fear of Current or Ex-Partner: No    Emotionally Abused: No    Physically Abused: No    Sexually Abused: No    Review of Systems  All other systems reviewed and are negative.       Objective    BP 138/87   Pulse 90   Temp 97.7 F (36.5 C) (Oral)   Resp 16   Wt (!) 316 lb (143.3 kg)   SpO2 98%   BMI 48.05 kg/m   Physical Exam Vitals and nursing note reviewed.  Constitutional:      General: She is not in acute distress.    Appearance: She is obese.  HENT:     Nose: Rhinorrhea: .Katrina Stewart  Cardiovascular:     Rate and Rhythm: Normal rate and regular rhythm.  Pulmonary:     Effort: Pulmonary effort is normal.     Breath sounds: Normal breath sounds.  Neurological:     General: No focal deficit present.     Mental Status: She is alert and oriented to person, place, and time.  Psychiatric:        Mood and Affect: Mood normal. Affect is flat.        Speech: Speech normal.        Behavior: Behavior normal. Behavior is cooperative.         Assessment & Plan:   1. Encounter for weight management Discussed dietary and activity options. Phentermine prescribed. Patient   2. Class 3 severe obesity with serious comorbidity and body mass index (BMI) of 45.0 to 49.9 in adult, unspecified obesity type (HCC) As above  3. Bilateral swelling of feet Symptoms improved/resolved    Return in about 4 weeks (around 09/13/2022) for follow up.   Katrina Raymond, MD

## 2022-09-08 ENCOUNTER — Encounter: Payer: Self-pay | Admitting: Family Medicine

## 2022-09-15 ENCOUNTER — Encounter: Payer: Self-pay | Admitting: Family Medicine

## 2022-09-15 ENCOUNTER — Ambulatory Visit (INDEPENDENT_AMBULATORY_CARE_PROVIDER_SITE_OTHER): Payer: Medicaid Other | Admitting: Family Medicine

## 2022-09-15 VITALS — BP 130/83 | HR 92 | Temp 98.1°F | Resp 16 | Wt 304.4 lb

## 2022-09-15 DIAGNOSIS — Z6841 Body Mass Index (BMI) 40.0 and over, adult: Secondary | ICD-10-CM | POA: Diagnosis not present

## 2022-09-15 DIAGNOSIS — Z7689 Persons encountering health services in other specified circumstances: Secondary | ICD-10-CM | POA: Diagnosis not present

## 2022-09-15 MED ORDER — PHENTERMINE HCL 37.5 MG PO TABS: 37.5000 mg | ORAL_TABLET | Freq: Every day | ORAL | 1 refills | Status: DC

## 2022-09-15 NOTE — Progress Notes (Signed)
Patient came in for monthly weight check. Patient has no other concerns today

## 2022-09-17 ENCOUNTER — Encounter: Payer: Self-pay | Admitting: Family Medicine

## 2022-09-17 NOTE — Progress Notes (Signed)
Established Patient Office Visit  Subjective    Patient ID: Katrina Stewart, female    DOB: 08/04/1995  Age: 27 y.o. MRN: [id number]  CC:  Chief Complaint  Patient presents with   Weight Check    HPI Katrina Stewart presents for routine weight management. Patient denies acute complaints or concerns.   Outpatient Encounter Medications as of 09/15/2022  Medication Sig   ARIPiprazole (ABILIFY) 15 MG tablet Take 1 tablet (15 mg total) by mouth daily.   FLUoxetine (PROZAC) 20 MG capsule Take 3 capsules (60 mg total) by mouth daily.   metFORMIN (GLUCOPHAGE-XR) 500 MG 24 hr tablet Take 1 tablet (500 mg total) by mouth daily with breakfast.   [DISCONTINUED] phentermine (ADIPEX-P) 37.5 MG tablet Take 1 tablet (37.5 mg total) by mouth daily before breakfast.   phentermine (ADIPEX-P) 37.5 MG tablet Take 1 tablet (37.5 mg total) by mouth daily before breakfast.   traZODone (DESYREL) 50 MG tablet Take 1 tablet (50 mg total) by mouth at bedtime. (Patient not taking: Reported on 09/15/2022)   No facility-administered encounter medications on file as of 09/15/2022.    Past Medical History:  Diagnosis Date   Anxiety    COVID-19 virus infection 12/05/2018   + again on 2/4 (asymptomatic)  postiive test in [facility]U on 11-2   Depression    Depression    Phreesia 12/17/2019   Depression    Phreesia 12/30/2019   Essential hypertension    Gestational diabetes mellitus (GDM)    Normal postpartum 2 hr GTT   Group B streptococcal infection in pregnancy 03/13/2019   Rh negative state in antepartum period 09/03/2015   [x]  needs rhogam at 28 weeks  [name] postive anti-D on initial lab. Patient received rhogam on 08/02/18 during [facility] visit   Rubella non-immune status, antepartum 09/03/2015   Vaginal Pap smear, abnormal     Past Surgical History:  Procedure Laterality Date   BREAST SURGERY  2013   Lt & Rt excision juvenile fibroadenoma   MASS EXCISION  10/29/2011   Procedure: EXCISION MASS;  Surgeon: Shelly Rubenstein, MD;  Location: [facility];  Service: General;  Laterality: Bilateral;  excision of bilateral breast masses    Family History  Problem Relation Age of Onset   Cancer Maternal Grandmother        lung cancer   Cancer Other        bladder cancer   Asthma Brother     Social History   Socioeconomic History   Marital status: Single    Spouse name: Not on file   Number of children: Not on file   Years of education: Not on file   Highest education level: Some college, no degree  Occupational History    Comment: unemployed  Tobacco Use   Smoking status: Never    Passive exposure: Never   Smokeless tobacco: Never  Vaping Use   Vaping status: Never Used  Substance and Sexual Activity   Alcohol use: Not Currently    Alcohol/week: 2.0 - 3.0 standard drinks of alcohol    Types: 2 - 3 Shots of liquor per week    Comment: every weekend before pregnancy (4-5 drinks)   Drug use: No   Sexual activity: Not Currently    Birth control/protection: None  Other Topics Concern   Not on file  Social History Narrative   Not on file   Social Determinants of Health   Financial Resource Strain: Low Risk  (05/06/2022)   Overall Financial Resource  Strain (CARDIA)    Difficulty of Paying Living Expenses: Not hard at all  Food Insecurity: No Food Insecurity (05/06/2022)   Hunger Vital Sign    Worried About Running Out of Food in the Last Year: Never true    Ran Out of Food in the Last Year: Never true  Transportation Needs: No Transportation Needs (05/06/2022)   PRAPARE - Administrator, Civil Service (Medical): No    Lack of Transportation (Non-Medical): No  Physical Activity: Insufficiently Active (05/06/2022)   Exercise Vital Sign    Days of Exercise per Week: 3 days    Minutes of Exercise per Session: 20 min  Stress: Stress Concern Present (05/06/2022)   [employer] of Occupational Health - Occupational Stress Questionnaire    Feeling of Stress : Rather much  Social  Connections: Moderately Isolated (05/06/2022)   Social Connection and Isolation Panel [NHANES]    Frequency of Communication with Friends and Family: More than three times a week    Frequency of Social Gatherings with Friends and Family: More than three times a week    Attends Religious Services: 1 to 4 times per year    Active Member of [employer] or Organizations: No    Attends Banker Meetings: Not on file    Marital Status: Never married  Intimate Partner Violence: Not At Risk (08/29/2018)   Humiliation, Afraid, Rape, and Kick questionnaire    Fear of Current or Ex-Partner: No    Emotionally Abused: No    Physically Abused: No    Sexually Abused: No    Review of Systems  All other systems reviewed and are negative.       Objective    BP 130/83   Pulse 92   Temp 98.1 F (36.7 C) (Oral)   Resp 16   Wt (!) 304 lb 6.4 oz (138.1 kg)   SpO2 96%   BMI 46.28 kg/m   Physical Exam Vitals and nursing note reviewed.  Constitutional:      General: She is not in acute distress. Cardiovascular:     Rate and Rhythm: Normal rate and regular rhythm.  Pulmonary:     Effort: Pulmonary effort is normal.     Breath sounds: Normal breath sounds.  Abdominal:     Palpations: Abdomen is soft.     Tenderness: There is no abdominal tenderness.  Neurological:     General: No focal deficit present.     Mental Status: She is alert and oriented to person, place, and time.         Assessment & Plan:   1. Encounter for weight management Doing well with present management. Phentermine refilled. Goal is 4-6lbs st loss/mo  2. Class 3 severe obesity with serious comorbidity and body mass index (BMI) of 45.0 to 49.9 in adult, unspecified obesity type (HCC)     No follow-ups on file.   Tommie Raymond, MD

## 2022-10-01 ENCOUNTER — Ambulatory Visit (INDEPENDENT_AMBULATORY_CARE_PROVIDER_SITE_OTHER): Payer: Medicaid Other | Admitting: Student

## 2022-10-01 VITALS — BP 149/92 | HR 99 | Ht 68.0 in | Wt 302.6 lb

## 2022-10-01 DIAGNOSIS — F3160 Bipolar disorder, current episode mixed, unspecified: Secondary | ICD-10-CM

## 2022-10-01 DIAGNOSIS — F4312 Post-traumatic stress disorder, chronic: Secondary | ICD-10-CM | POA: Diagnosis not present

## 2022-10-01 DIAGNOSIS — F1090 Alcohol use, unspecified, uncomplicated: Secondary | ICD-10-CM

## 2022-10-01 MED ORDER — ARIPIPRAZOLE 15 MG PO TABS: 7.5000 mg | ORAL_TABLET | Freq: Every day | ORAL | 0 refills | Status: DC

## 2022-10-01 MED ORDER — QUETIAPINE FUMARATE 100 MG PO TABS: 100.0000 mg | ORAL_TABLET | Freq: Every day | ORAL | 0 refills | Status: DC

## 2022-10-01 MED ORDER — QUETIAPINE FUMARATE 200 MG PO TABS: 200.0000 mg | ORAL_TABLET | Freq: Every day | ORAL | 0 refills | Status: DC
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
## 2022-10-04 ENCOUNTER — Encounter (HOSPITAL_COMMUNITY): Payer: Self-pay | Admitting: Student

## 2022-10-04 DIAGNOSIS — F4312 Post-traumatic stress disorder, chronic: Secondary | ICD-10-CM | POA: Insufficient documentation

## 2022-10-04 HISTORY — DX: Post-traumatic stress disorder, chronic: F43.12

## 2022-10-19 ENCOUNTER — Ambulatory Visit: Payer: Medicaid Other | Admitting: Family Medicine

## 2022-11-04 ENCOUNTER — Ambulatory Visit: Payer: Medicaid Other | Admitting: Family Medicine

## 2022-11-04 VITALS — BP 136/85 | HR 97 | Temp 98.1°F | Resp 16 | Wt 286.0 lb

## 2022-11-04 DIAGNOSIS — E66813 Obesity, class 3: Secondary | ICD-10-CM

## 2022-11-04 DIAGNOSIS — Z7689 Persons encountering health services in other specified circumstances: Secondary | ICD-10-CM

## 2022-11-04 DIAGNOSIS — Z6841 Body Mass Index (BMI) 40.0 and over, adult: Secondary | ICD-10-CM | POA: Diagnosis not present

## 2022-11-04 MED ORDER — PHENTERMINE HCL 37.5 MG PO TABS: 37.5000 mg | ORAL_TABLET | Freq: Every day | ORAL | 1 refills | Status: DC

## 2022-11-05 ENCOUNTER — Encounter: Payer: Self-pay | Admitting: Family Medicine

## 2022-11-05 NOTE — Progress Notes (Signed)
Established Patient Office Visit  Subjective    Patient ID: Katrina Stewart, female    DOB: 1995/04/08  Age: 27 y.o. MRN: [id number]  CC: No chief complaint on file.   HPI Katrina Stewart presents for weight management. Patient denies acute complaints or concerns.   Outpatient Encounter Medications as of 11/04/2022  Medication Sig   ARIPiprazole (ABILIFY) 15 MG tablet Take 0.5 tablets (7.5 mg total) by mouth daily for 14 days. Then discontinue medication   FLUoxetine (PROZAC) 20 MG capsule Take 3 capsules (60 mg total) by mouth daily.   metFORMIN (GLUCOPHAGE-XR) 500 MG 24 hr tablet Take 1 tablet (500 mg total) by mouth daily with breakfast. (Patient not taking: Reported on 10/01/2022)   phentermine (ADIPEX-P) 37.5 MG tablet Take 1 tablet (37.5 mg total) by mouth daily before breakfast.   QUEtiapine (SEROQUEL) 100 MG tablet Take 1 tablet (100 mg total) by mouth at bedtime for 14 days.   QUEtiapine (SEROQUEL) 200 MG tablet Take 1 tablet (200 mg total) by mouth at bedtime. Beginning 10/16/22.   [DISCONTINUED] phentermine (ADIPEX-P) 37.5 MG tablet Take 1 tablet (37.5 mg total) by mouth daily before breakfast.   No facility-administered encounter medications on file as of 11/04/2022.    Past Medical History:  Diagnosis Date   Anxiety    COVID-19 virus infection 12/05/2018   + again on 2/4 (asymptomatic)  postiive test in MAU on 11-2   Depression    Depression    Phreesia 12/17/2019   Depression    Phreesia 12/30/2019   Essential hypertension    Gestational diabetes mellitus (GDM)    Normal postpartum 2 hr GTT   Group B streptococcal infection in pregnancy 03/13/2019   Rh negative state in antepartum period 09/03/2015   [x]  needs rhogam at 28 weeks  [name] postive anti-D on initial lab. Patient received rhogam on 08/02/18 during MAU visit   Rubella non-immune status, antepartum 09/03/2015   Vaginal Pap smear, abnormal     Past Surgical History:  Procedure Laterality Date   BREAST  SURGERY  2013   Lt & Rt excision juvenile fibroadenoma   MASS EXCISION  10/29/2011   Procedure: EXCISION MASS;  Surgeon: Shelly Rubenstein, MD;  Location: [facility];  Service: General;  Laterality: Bilateral;  excision of bilateral breast masses    Family History  Problem Relation Age of Onset   Cancer Maternal Grandmother        lung cancer   Cancer Other        bladder cancer   Asthma Brother     Social History   Socioeconomic History   Marital status: Single    Spouse name: Not on file   Number of children: Not on file   Years of education: Not on file   Highest education level: Some college, no degree  Occupational History    Comment: unemployed  Tobacco Use   Smoking status: Never    Passive exposure: Never   Smokeless tobacco: Never  Vaping Use   Vaping status: Never Used  Substance and Sexual Activity   Alcohol use: Not Currently    Alcohol/week: 2.0 - 3.0 standard drinks of alcohol    Types: 2 - 3 Shots of liquor per week    Comment: every weekend before pregnancy (4-5 drinks)   Drug use: No   Sexual activity: Not Currently    Birth control/protection: None  Other Topics Concern   Not on file  Social History Narrative   Not on file  Social Determinants of Health   Financial Resource Strain: Low Risk  (05/06/2022)   Overall Financial Resource Strain (CARDIA)    Difficulty of Paying Living Expenses: Not hard at all  Food Insecurity: No Food Insecurity (05/06/2022)   Hunger Vital Sign    Worried About Running Out of Food in the Last Year: Never true    Ran Out of Food in the Last Year: Never true  Transportation Needs: No Transportation Needs (05/06/2022)   PRAPARE - Administrator, Civil Service (Medical): No    Lack of Transportation (Non-Medical): No  Physical Activity: Insufficiently Active (05/06/2022)   Exercise Vital Sign    Days of Exercise per Week: 3 days    Minutes of Exercise per Session: 20 min  Stress: Stress Concern Present (05/06/2022)    [employer] of Occupational Health - Occupational Stress Questionnaire    Feeling of Stress : Rather much  Social Connections: Moderately Isolated (05/06/2022)   Social Connection and Isolation Panel [NHANES]    Frequency of Communication with Friends and Family: More than three times a week    Frequency of Social Gatherings with Friends and Family: More than three times a week    Attends Religious Services: 1 to 4 times per year    Active Member of [employer] or Organizations: No    Attends Banker Meetings: Not on file    Marital Status: Never married  Intimate Partner Violence: Not At Risk (08/29/2018)   Humiliation, Afraid, Rape, and Kick questionnaire    Fear of Current or Ex-Partner: No    Emotionally Abused: No    Physically Abused: No    Sexually Abused: No    Review of Systems  All other systems reviewed and are negative.       Objective    BP 136/85   Pulse 97   Temp 98.1 F (36.7 C) (Oral)   Resp 16   Wt 286 lb (129.7 kg)   SpO2 97%   BMI 43.49 kg/m   Physical Exam Vitals and nursing note reviewed.  Constitutional:      General: She is not in acute distress. Cardiovascular:     Rate and Rhythm: Normal rate and regular rhythm.  Pulmonary:     Effort: Pulmonary effort is normal.     Breath sounds: Normal breath sounds.  Abdominal:     Palpations: Abdomen is soft.     Tenderness: There is no abdominal tenderness.  Neurological:     General: No focal deficit present.     Mental Status: She is alert and oriented to person, place, and time.         Assessment & Plan:   Encounter for weight management  Class 3 severe obesity due to excess calories with serious comorbidity and body mass index (BMI) of 40.0 to 44.9 in adult [facility])  Other orders -     Phentermine HCl; Take 1 tablet (37.5 mg total) by mouth daily before breakfast.  Dispense: 30 tablet; Refill: 1     Return in about 4 weeks (around 12/02/2022) for follow up.   Tommie Raymond, MD

## 2022-11-09 ENCOUNTER — Encounter (HOSPITAL_COMMUNITY): Payer: Medicaid Other | Admitting: Student

## 2022-11-22 ENCOUNTER — Other Ambulatory Visit: Payer: Self-pay | Admitting: Family Medicine

## 2022-11-22 NOTE — Telephone Encounter (Signed)
Medication Refill - Medication: FLUoxetine (PROZAC) 20 MG capsule   Has the patient contacted their pharmacy? Yes.     Preferred Pharmacy (with phone number or street name):    [facility] [address] [place]), [place] - [id number] Phone: [phone]  Fax: [phone]      Has the patient been seen for an appointment in the last year OR does the patient have an upcoming appointment? Yes.    Please assist patient further

## 2022-11-23 MED ORDER — FLUOXETINE HCL 20 MG PO CAPS: 60.0000 mg | ORAL_CAPSULE | Freq: Every day | ORAL | 2 refills | Status: DC

## 2022-11-23 NOTE — Telephone Encounter (Signed)
Requested Prescriptions  Pending Prescriptions Disp Refills   FLUoxetine (PROZAC) 20 MG capsule 90 capsule 2    Sig: Take 3 capsules (60 mg total) by mouth daily.     Psychiatry:  Antidepressants - SSRI Passed - 11/22/2022  2:41 PM      Passed - Completed PHQ-2 or PHQ-9 in the last 360 days      Passed - Valid encounter within last 6 months    Recent Outpatient Visits           2 weeks ago Encounter for weight management   [place] [facility], MD   2 months ago Encounter for weight management   [place] [facility], MD   3 months ago Encounter for weight management   [place] [facility], MD   4 months ago Bilateral swelling of feet   [place] [facility] [place], NP   6 months ago Encounter for assessment of STD exposure   [place] [facility], MD       Future Appointments             In 2 weeks Georganna Skeans, MD [facility]

## 2022-12-02 ENCOUNTER — Ambulatory Visit (INDEPENDENT_AMBULATORY_CARE_PROVIDER_SITE_OTHER): Payer: Medicaid Other | Admitting: Student

## 2022-12-02 ENCOUNTER — Encounter (HOSPITAL_COMMUNITY): Payer: Self-pay | Admitting: Student

## 2022-12-02 VITALS — BP 146/93 | HR 100 | Ht 68.0 in | Wt 284.8 lb

## 2022-12-02 DIAGNOSIS — F4312 Post-traumatic stress disorder, chronic: Secondary | ICD-10-CM

## 2022-12-02 DIAGNOSIS — F1090 Alcohol use, unspecified, uncomplicated: Secondary | ICD-10-CM

## 2022-12-02 DIAGNOSIS — F3160 Bipolar disorder, current episode mixed, unspecified: Secondary | ICD-10-CM

## 2022-12-02 DIAGNOSIS — E559 Vitamin D deficiency, unspecified: Secondary | ICD-10-CM

## 2022-12-02 DIAGNOSIS — R29818 Other symptoms and signs involving the nervous system: Secondary | ICD-10-CM | POA: Diagnosis not present

## 2022-12-02 DIAGNOSIS — D649 Anemia, unspecified: Secondary | ICD-10-CM

## 2022-12-02 MED ORDER — QUETIAPINE FUMARATE 300 MG PO TABS: 300.0000 mg | ORAL_TABLET | Freq: Every day | ORAL | 1 refills | Status: DC
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
## 2022-12-07 ENCOUNTER — Ambulatory Visit (INDEPENDENT_AMBULATORY_CARE_PROVIDER_SITE_OTHER): Payer: Medicaid Other | Admitting: Family Medicine

## 2022-12-07 ENCOUNTER — Encounter: Payer: Self-pay | Admitting: Family Medicine

## 2022-12-07 VITALS — BP 135/95 | HR 88 | Temp 98.7°F | Resp 16 | Ht 68.0 in | Wt 278.4 lb

## 2022-12-07 DIAGNOSIS — Z23 Encounter for immunization: Secondary | ICD-10-CM

## 2022-12-07 DIAGNOSIS — Z7984 Long term (current) use of oral hypoglycemic drugs: Secondary | ICD-10-CM | POA: Diagnosis not present

## 2022-12-07 DIAGNOSIS — E66813 Obesity, class 3: Secondary | ICD-10-CM

## 2022-12-07 DIAGNOSIS — Z6841 Body Mass Index (BMI) 40.0 and over, adult: Secondary | ICD-10-CM | POA: Diagnosis not present

## 2022-12-07 DIAGNOSIS — Z7689 Persons encountering health services in other specified circumstances: Secondary | ICD-10-CM

## 2022-12-07 MED ORDER — PHENTERMINE HCL 37.5 MG PO TABS: 37.5000 mg | ORAL_TABLET | Freq: Every day | ORAL | 0 refills | Status: DC

## 2022-12-08 ENCOUNTER — Encounter: Payer: Self-pay | Admitting: Family Medicine

## 2022-12-08 NOTE — Progress Notes (Signed)
Established Patient Office Visit  Subjective    Patient ID: Katrina Stewart, female    DOB: 23-May-1995  Age: 27 y.o. MRN: [id number]  CC:  Chief Complaint  Patient presents with   Follow-up    Weight management, labs for medication management    HPI Katrina Stewart presents for routine weight management. Patient reports doing well with present management and denies acute complaints.   Outpatient Encounter Medications as of 12/07/2022  Medication Sig   FLUoxetine (PROZAC) 20 MG capsule Take 3 capsules (60 mg total) by mouth daily.   QUEtiapine (SEROQUEL) 300 MG tablet Take 1 tablet (300 mg total) by mouth at bedtime.   [DISCONTINUED] phentermine (ADIPEX-P) 37.5 MG tablet Take 1 tablet (37.5 mg total) by mouth daily before breakfast.   metFORMIN (GLUCOPHAGE-XR) 500 MG 24 hr tablet Take 1 tablet (500 mg total) by mouth daily with breakfast. (Patient not taking: Reported on 10/01/2022)   phentermine (ADIPEX-P) 37.5 MG tablet Take 1 tablet (37.5 mg total) by mouth daily before breakfast.   No facility-administered encounter medications on file as of 12/07/2022.    Past Medical History:  Diagnosis Date   Anxiety    COVID-19 virus infection 12/05/2018   + again on 2/4 (asymptomatic)  postiive test in MAU on 11-2   Depression    Depression    Phreesia 12/17/2019   Depression    Phreesia 12/30/2019   Essential hypertension    Gestational diabetes mellitus (GDM)    Normal postpartum 2 hr GTT   Group B streptococcal infection in pregnancy 03/13/2019   Rh negative state in antepartum period 09/03/2015   [x]  needs rhogam at 28 weeks  [name] postive anti-D on initial lab. Patient received rhogam on 08/02/18 during MAU visit   Rubella non-immune status, antepartum 09/03/2015   Vaginal Pap smear, abnormal     Past Surgical History:  Procedure Laterality Date   BREAST SURGERY  2013   Lt & Rt excision juvenile fibroadenoma   MASS EXCISION  10/29/2011   Procedure: EXCISION MASS;  Surgeon:  Shelly Rubenstein, MD;  Location: [facility];  Service: General;  Laterality: Bilateral;  excision of bilateral breast masses    Family History  Problem Relation Age of Onset   Cancer Maternal Grandmother        lung cancer   Cancer Other        bladder cancer   Asthma Brother     Social History   Socioeconomic History   Marital status: Single    Spouse name: Not on file   Number of children: Not on file   Years of education: Not on file   Highest education level: Some college, no degree  Occupational History    Comment: unemployed  Tobacco Use   Smoking status: Never    Passive exposure: Never   Smokeless tobacco: Never  Vaping Use   Vaping status: Never Used  Substance and Sexual Activity   Alcohol use: Not Currently    Alcohol/week: 2.0 - 3.0 standard drinks of alcohol    Types: 2 - 3 Shots of liquor per week    Comment: every weekend before pregnancy (4-5 drinks)   Drug use: No   Sexual activity: Not Currently    Birth control/protection: None  Other Topics Concern   Not on file  Social History Narrative   Not on file   Social Determinants of Health   Financial Resource Strain: Low Risk  (12/06/2022)   Overall Financial Resource Strain (CARDIA)  Difficulty of Paying Living Expenses: Not hard at all  Food Insecurity: No Food Insecurity (12/06/2022)   Hunger Vital Sign    Worried About Running Out of Food in the Last Year: Never true    Ran Out of Food in the Last Year: Never true  Transportation Needs: No Transportation Needs (12/06/2022)   PRAPARE - Administrator, Civil Service (Medical): No    Lack of Transportation (Non-Medical): No  Physical Activity: Insufficiently Active (12/06/2022)   Exercise Vital Sign    Days of Exercise per Week: 5 days    Minutes of Exercise per Session: 20 min  Stress: Stress Concern Present (12/06/2022)   [employer] of Occupational Health - Occupational Stress Questionnaire    Feeling of Stress : Very much   Social Connections: Moderately Isolated (12/06/2022)   Social Connection and Isolation Panel [NHANES]    Frequency of Communication with Friends and Family: More than three times a week    Frequency of Social Gatherings with Friends and Family: More than three times a week    Attends Religious Services: 1 to 4 times per year    Active Member of [employer] or Organizations: No    Attends Banker Meetings: Not on file    Marital Status: Never married  Intimate Partner Violence: Not At Risk (12/07/2022)   Humiliation, Afraid, Rape, and Kick questionnaire    Fear of Current or Ex-Partner: No    Emotionally Abused: No    Physically Abused: No    Sexually Abused: No    Review of Systems  All other systems reviewed and are negative.       Objective    BP (!) 135/95 (BP Location: Right Arm, Patient Position: Sitting, Cuff Size: Large)   Pulse 88   Temp 98.7 F (37.1 C) (Oral)   Resp 16   Ht 5\' 8"  (1.727 m)   Wt 278 lb 6.4 oz (126.3 kg)   SpO2 96%   BMI 42.33 kg/m   Physical Exam Vitals and nursing note reviewed.  Constitutional:      General: She is not in acute distress. Cardiovascular:     Rate and Rhythm: Normal rate and regular rhythm.  Pulmonary:     Effort: Pulmonary effort is normal.     Breath sounds: Normal breath sounds.  Abdominal:     Palpations: Abdomen is soft.     Tenderness: There is no abdominal tenderness.  Neurological:     General: No focal deficit present.     Mental Status: She is alert and oriented to person, place, and time.         Assessment & Plan:   1. Encounter for weight management Doing well. Phentermine prescribed.   2. Class 3 severe obesity due to excess calories with serious comorbidity and body mass index (BMI) of 40.0 to 44.9 in adult (HCC)   3. Encounter for immunization  - Flu vaccine trivalent PF, 6mos and older(Flulaval,Afluria,Fluarix,Fluzone)    No follow-ups on file.   Tommie Raymond, MD

## 2022-12-10 ENCOUNTER — Telehealth: Payer: Self-pay | Admitting: Family Medicine

## 2022-12-10 NOTE — Telephone Encounter (Signed)
Copied from CRM [id number]. Topic: General - Other >> Dec 09, 2022  4:52 PM Macon Large wrote: Reason for CRM: Pt stated that she was told that she needed to get labs done but there are no orders. Pt stated the request for labs came from [facility]

## 2022-12-13 NOTE — Telephone Encounter (Signed)
Called patient and let her know that the provider that order the labs will be responsible for telling her where to get them done.  Which was [facility].  Per Angelica

## 2022-12-21 ENCOUNTER — Ambulatory Visit (INDEPENDENT_AMBULATORY_CARE_PROVIDER_SITE_OTHER): Payer: Medicaid Other | Admitting: Family Medicine

## 2022-12-21 ENCOUNTER — Encounter: Payer: Self-pay | Admitting: Family Medicine

## 2022-12-21 ENCOUNTER — Ambulatory Visit: Payer: Self-pay | Admitting: *Deleted

## 2022-12-21 VITALS — BP 145/91 | HR 107 | Temp 98.7°F | Resp 18 | Ht 68.0 in | Wt 277.8 lb

## 2022-12-21 DIAGNOSIS — Z7689 Persons encountering health services in other specified circumstances: Secondary | ICD-10-CM | POA: Diagnosis not present

## 2022-12-21 DIAGNOSIS — R5383 Other fatigue: Secondary | ICD-10-CM

## 2022-12-21 DIAGNOSIS — E66813 Obesity, class 3: Secondary | ICD-10-CM

## 2022-12-21 DIAGNOSIS — Z6841 Body Mass Index (BMI) 40.0 and over, adult: Secondary | ICD-10-CM

## 2022-12-21 NOTE — Telephone Encounter (Signed)
Summary: Per agent:  headaches, backpain, nauseated has vomited after eating multiple times a day.   "Pt stated she has been experiencing headaches, backpain, nauseated has vomited after eating multiple times a day. Stated this has been going on for 5 days.  Seeking clinical advice."         Chief Complaint: Vomiting Symptoms: Nausea, vomiting, headache, lower back pain, abd cramping at times Frequency: 5 days Pertinent Negatives: Patient denies fever, no s/s dehydration. Disposition: [] ED /[] Urgent Care (no appt availability in office) / [x] Appointment(In office/virtual)/ []  [place] [facility]/ [] Home Care/ [] Refused Recommended Disposition /[] [facility]/ []  Follow-up with PCP Additional Notes:  States vomiting after eating >6 times in 24hr period. 7/10 headache, tylenol effective. Lower back pain, no dysuria.Appt secured for today. Care advise provided, pt verbalizes understanding. Reason for Disposition  [1] MILD or MODERATE vomiting AND [2] present > 48 hours (2 days) (Exception: Mild vomiting with associated diarrhea.)  Answer Assessment - Initial Assessment Questions 1. VOMITING SEVERITY: "How many times have you vomited in the past 24 hours?"     - MILD:  1 - 2 times/day    - MODERATE: 3 - 5 times/day, decreased oral intake without significant weight loss or symptoms of dehydration    - SEVERE: 6 or more times/day, vomits everything or nearly everything, with significant weight loss, symptoms of dehydration      >6, always after eating 2. ONSET: "When did the vomiting begin?"      5 days ago 3. FLUIDS: "What fluids or food have you vomited up today?" "Have you been able to keep any fluids down?"     Keeping fluids down 4. ABDOMEN PAIN: "Are your having any abdomen pain?" If Yes : "How bad is it and what does it feel like?" (e.g., crampy, dull, intermittent, constant)      Some cramping 5. DIARRHEA: "Is there any diarrhea?" If Yes, ask: "How many times today?"       no 6. CONTACTS: "Is there anyone else in the family with the same symptoms?"      No 7. CAUSE: "What do you think is causing your vomiting?"     Unsure 8. HYDRATION STATUS: "Any signs of dehydration?" (e.g., dry mouth [not only dry lips], too weak to stand) "When did you last urinate?"     Staying hydrated. 9. OTHER SYMPTOMS: "Do you have any other symptoms?" (e.g., fever, headache, vertigo, vomiting blood or coffee grounds, recent head injury)     Headache 7/10, lower back pain  Protocols used: Vomiting-A-AH

## 2022-12-22 LAB — CBC WITH DIFFERENTIAL/PLATELET
Basophils Absolute: 0.1 10*3/uL (ref 0.0–0.2)
Basos: 1 %
EOS (ABSOLUTE): 0.1 10*3/uL (ref 0.0–0.4)
Eos: 2 %
Hematocrit: 36.6 % (ref 34.0–46.6)
Hemoglobin: 10.7 g/dL — ABNORMAL LOW (ref 11.1–15.9)
Immature Grans (Abs): 0 10*3/uL (ref 0.0–0.1)
Immature Granulocytes: 0 %
Lymphocytes Absolute: 3.2 10*3/uL — ABNORMAL HIGH (ref 0.7–3.1)
Lymphs: 40 %
MCH: 20.5 pg — ABNORMAL LOW (ref 26.6–33.0)
MCHC: 29.2 g/dL — ABNORMAL LOW (ref 31.5–35.7)
MCV: 70 fL — ABNORMAL LOW (ref 79–97)
Monocytes Absolute: 0.7 10*3/uL (ref 0.1–0.9)
Monocytes: 9 %
Neutrophils Absolute: 3.8 10*3/uL (ref 1.4–7.0)
Neutrophils: 48 %
Platelets: 384 10*3/uL (ref 150–450)
RBC: 5.21 x10E6/uL (ref 3.77–5.28)
RDW: 18.7 % — ABNORMAL HIGH (ref 11.7–15.4)
WBC: 7.9 10*3/uL (ref 3.4–10.8)

## 2022-12-22 LAB — IRON,TIBC AND FERRITIN PANEL
Ferritin: 11 ng/mL — ABNORMAL LOW (ref 15–150)
Iron Saturation: 10 % — ABNORMAL LOW (ref 15–55)
Iron: 44 ug/dL (ref 27–159)
Total Iron Binding Capacity: 433 ug/dL (ref 250–450)
UIBC: 389 ug/dL (ref 131–425)

## 2022-12-22 LAB — VITAMIN D 25 HYDROXY (VIT D DEFICIENCY, FRACTURES): Vit D, 25-Hydroxy: 12.5 ng/mL — ABNORMAL LOW (ref 30.0–100.0)

## 2022-12-23 ENCOUNTER — Encounter: Payer: Self-pay | Admitting: Family Medicine

## 2022-12-23 MED ORDER — VITAMIN D (ERGOCALCIFEROL) 1.25 MG (50000 UNIT) PO CAPS: 50000.0000 [IU] | ORAL_CAPSULE | ORAL | 0 refills | Status: DC

## 2022-12-23 MED ORDER — IRON (FERROUS SULFATE) 325 (65 FE) MG PO TABS: 325.0000 mg | ORAL_TABLET | Freq: Two times a day (BID) | ORAL | 3 refills | Status: DC

## 2022-12-23 NOTE — Progress Notes (Signed)
Established Patient Office Visit  Subjective    Patient ID: Katrina Stewart, female    DOB: [birth year]  Age: 27 y.o. MRN: [id number]  CC:  Chief Complaint  Patient presents with   Nausea    Back pain, headache, sensitive smell make her sick    HPI Katrina Stewart presents for weight management and with complaints or fatigue. Patient reports that she has had increased stressor and has not been doing well with losing weight.   Outpatient Encounter Medications as of 12/21/2022  Medication Sig   FLUoxetine (PROZAC) 20 MG capsule Take 3 capsules (60 mg total) by mouth daily.   phentermine (ADIPEX-P) 37.5 MG tablet Take 1 tablet (37.5 mg total) by mouth daily before breakfast.   QUEtiapine (SEROQUEL) 300 MG tablet Take 1 tablet (300 mg total) by mouth at bedtime.   metFORMIN (GLUCOPHAGE-XR) 500 MG 24 hr tablet Take 1 tablet (500 mg total) by mouth daily with breakfast. (Patient not taking: Reported on 10/01/2022)   No facility-administered encounter medications on file as of 12/21/2022.    Past Medical History:  Diagnosis Date   Anxiety    COVID-19 virus infection 12/05/2018   + again on 2/4 (asymptomatic)  postiive test in [facility] on 11-2   Depression    Depression    Phreesia 12/17/2019   Depression    Phreesia 12/30/2019   Essential hypertension    Gestational diabetes mellitus (GDM)    Normal postpartum 2 hr GTT   Group B streptococcal infection in pregnancy 03/13/2019   Rh negative state in antepartum period 09/03/2015   [x]  needs rhogam at 28 weeks  [name] postive anti-D on initial lab. Patient received rhogam on 08/02/18 during [facility] visit   Rubella non-immune status, antepartum 09/03/2015   Vaginal Pap smear, abnormal     Past Surgical History:  Procedure Laterality Date   BREAST SURGERY  2013   Lt & Rt excision juvenile fibroadenoma   MASS EXCISION  10/29/2011   Procedure: EXCISION MASS;  Surgeon: Shelly Rubenstein, MD;  Location: [facility];  Service: General;  Laterality:  Bilateral;  excision of bilateral breast masses    Family History  Problem Relation Age of Onset   Cancer Maternal Grandmother        lung cancer   Cancer Other        bladder cancer   Asthma Brother     Social History   Socioeconomic History   Marital status: Single    Spouse name: Not on file   Number of children: Not on file   Years of education: Not on file   Highest education level: Some college, no degree  Occupational History    Comment: unemployed  Tobacco Use   Smoking status: Never    Passive exposure: Never   Smokeless tobacco: Never  Vaping Use   Vaping status: Never Used  Substance and Sexual Activity   Alcohol use: Not Currently    Alcohol/week: 2.0 - 3.0 standard drinks of alcohol    Types: 2 - 3 Shots of liquor per week    Comment: every weekend before pregnancy (4-5 drinks)   Drug use: No   Sexual activity: Not Currently    Birth control/protection: None  Other Topics Concern   Not on file  Social History Narrative   Not on file   Social Determinants of Health   Financial Resource Strain: Low Risk  (12/06/2022)   Overall Financial Resource Strain (CARDIA)    Difficulty of Paying Living Expenses:  Not hard at all  Food Insecurity: No Food Insecurity (12/06/2022)   Hunger Vital Sign    Worried About Running Out of Food in the Last Year: Never true    Ran Out of Food in the Last Year: Never true  Transportation Needs: No Transportation Needs (12/06/2022)   PRAPARE - Administrator, Civil Service (Medical): No    Lack of Transportation (Non-Medical): No  Physical Activity: Insufficiently Active (12/06/2022)   Exercise Vital Sign    Days of Exercise per Week: 5 days    Minutes of Exercise per Session: 20 min  Stress: Stress Concern Present (12/06/2022)   [employer] of Occupational Health - Occupational Stress Questionnaire    Feeling of Stress : Very much  Social Connections: Moderately Isolated (12/06/2022)   Social Connection and  Isolation Panel [NHANES]    Frequency of Communication with Friends and Family: More than three times a week    Frequency of Social Gatherings with Friends and Family: More than three times a week    Attends Religious Services: 1 to 4 times per year    Active Member of [employer] or Organizations: No    Attends Banker Meetings: Not on file    Marital Status: Never married  Intimate Partner Violence: Not At Risk (12/07/2022)   Humiliation, Afraid, Rape, and Kick questionnaire    Fear of Current or Ex-Partner: No    Emotionally Abused: No    Physically Abused: No    Sexually Abused: No    Review of Systems  Constitutional:  Positive for malaise/fatigue.  All other systems reviewed and are negative.       Objective    BP (!) 145/91 (BP Location: Right Arm, Patient Position: Sitting, Cuff Size: Large)   Pulse (!) 107   Temp 98.7 F (37.1 C) (Oral)   Resp 18   Ht 5\' 8"  (1.727 m)   Wt 277 lb 12.8 oz (126 kg)   SpO2 98%   BMI 42.24 kg/m   Physical Exam Vitals and nursing note reviewed.  Constitutional:      General: She is not in acute distress. Cardiovascular:     Rate and Rhythm: Normal rate and regular rhythm.  Pulmonary:     Effort: Pulmonary effort is normal.     Breath sounds: Normal breath sounds.  Abdominal:     Palpations: Abdomen is soft.     Tenderness: There is no abdominal tenderness.  Neurological:     General: No focal deficit present.     Mental Status: She is alert and oriented to person, place, and time.         Assessment & Plan:   Other fatigue -     VITAMIN D 25 Hydroxy (Vit-D Deficiency, Fractures) -     CBC with Differential/Platelet -     Iron, TIBC and Ferritin Panel  Encounter for weight management  Class 3 severe obesity due to excess calories with serious comorbidity and body mass index (BMI) of 40.0 to 44.9 in adult [facility]     Return in about 2 months (around 02/20/2023).   Tommie Raymond, MD

## 2022-12-29 ENCOUNTER — Ambulatory Visit
Admission: EM | Admit: 2022-12-29 | Discharge: 2022-12-29 | Disposition: A | Discharge status: Home / Self Care | Payer: Medicaid Other | Attending: Physician Assistant | Admitting: Physician Assistant

## 2022-12-29 ENCOUNTER — Other Ambulatory Visit: Payer: Self-pay

## 2022-12-29 ENCOUNTER — Emergency Department (HOSPITAL_COMMUNITY): Payer: Medicaid Other

## 2022-12-29 ENCOUNTER — Encounter: Payer: Self-pay | Admitting: Emergency Medicine

## 2022-12-29 ENCOUNTER — Inpatient Hospital Stay (HOSPITAL_COMMUNITY)
Admission: EM | Admit: 2022-12-29 | Discharge: 2023-01-02 | DRG: 439 | Disposition: A | Discharge status: Home / Self Care | Payer: Medicaid Other | Attending: Internal Medicine | Admitting: Internal Medicine

## 2022-12-29 ENCOUNTER — Encounter (HOSPITAL_COMMUNITY): Payer: Self-pay

## 2022-12-29 DIAGNOSIS — Z8052 Family history of malignant neoplasm of bladder: Secondary | ICD-10-CM

## 2022-12-29 DIAGNOSIS — K859 Acute pancreatitis without necrosis or infection, unspecified: Principal | ICD-10-CM | POA: Diagnosis present

## 2022-12-29 DIAGNOSIS — Z825 Family history of asthma and other chronic lower respiratory diseases: Secondary | ICD-10-CM

## 2022-12-29 DIAGNOSIS — R1030 Lower abdominal pain, unspecified: Secondary | ICD-10-CM | POA: Diagnosis not present

## 2022-12-29 DIAGNOSIS — F32A Depression, unspecified: Secondary | ICD-10-CM

## 2022-12-29 DIAGNOSIS — Z801 Family history of malignant neoplasm of trachea, bronchus and lung: Secondary | ICD-10-CM

## 2022-12-29 DIAGNOSIS — Z1152 Encounter for screening for COVID-19: Secondary | ICD-10-CM

## 2022-12-29 DIAGNOSIS — F1010 Alcohol abuse, uncomplicated: Secondary | ICD-10-CM | POA: Diagnosis present

## 2022-12-29 DIAGNOSIS — D509 Iron deficiency anemia, unspecified: Secondary | ICD-10-CM | POA: Diagnosis present

## 2022-12-29 DIAGNOSIS — Z8616 Personal history of COVID-19: Secondary | ICD-10-CM

## 2022-12-29 DIAGNOSIS — F319 Bipolar disorder, unspecified: Secondary | ICD-10-CM | POA: Diagnosis present

## 2022-12-29 DIAGNOSIS — K5900 Constipation, unspecified: Secondary | ICD-10-CM | POA: Diagnosis present

## 2022-12-29 DIAGNOSIS — Z79899 Other long term (current) drug therapy: Secondary | ICD-10-CM

## 2022-12-29 DIAGNOSIS — R748 Abnormal levels of other serum enzymes: Secondary | ICD-10-CM

## 2022-12-29 DIAGNOSIS — E876 Hypokalemia: Secondary | ICD-10-CM | POA: Diagnosis present

## 2022-12-29 DIAGNOSIS — M545 Low back pain, unspecified: Principal | ICD-10-CM

## 2022-12-29 DIAGNOSIS — F419 Anxiety disorder, unspecified: Secondary | ICD-10-CM | POA: Diagnosis present

## 2022-12-29 DIAGNOSIS — E871 Hypo-osmolality and hyponatremia: Secondary | ICD-10-CM | POA: Diagnosis present

## 2022-12-29 DIAGNOSIS — I10 Essential (primary) hypertension: Secondary | ICD-10-CM | POA: Diagnosis present

## 2022-12-29 LAB — COMPREHENSIVE METABOLIC PANEL
ALT: 13 U/L (ref 0–44)
AST: 17 U/L (ref 15–41)
Albumin: 4.2 g/dL (ref 3.5–5.0)
Alkaline Phosphatase: 66 U/L (ref 38–126)
Anion gap: 9 (ref 5–15)
BUN: 12 mg/dL (ref 6–20)
CO2: 22 mmol/L (ref 22–32)
Calcium: 9.3 mg/dL (ref 8.9–10.3)
Chloride: 106 mmol/L (ref 98–111)
Creatinine, Ser: 0.83 mg/dL (ref 0.44–1.00)
GFR, Estimated: >60 mL/min (ref >60)
Glucose, Bld: 98 mg/dL (ref 70–99)
Potassium: 3.7 mmol/L (ref 3.5–5.1)
Sodium: 137 mmol/L (ref 135–145)
Total Bilirubin: 0.6 mg/dL (ref <1.2)
Total Protein: 8.1 g/dL (ref 6.5–8.1)

## 2022-12-29 LAB — URINALYSIS, ROUTINE W REFLEX MICROSCOPIC
Bacteria, UA: NONE SEEN
Bilirubin Urine: NEGATIVE
Glucose, UA: NEGATIVE mg/dL
Ketones, ur: NEGATIVE mg/dL
Nitrite: NEGATIVE
Protein, ur: 100 mg/dL — AB
RBC / HPF: >50 RBC/hpf (ref 0–5)
Specific Gravity, Urine: 1.021 (ref 1.005–1.030)
pH: 5 (ref 5.0–8.0)

## 2022-12-29 LAB — HCG, SERUM, QUALITATIVE: Preg, Serum: NEGATIVE

## 2022-12-29 LAB — POCT URINALYSIS DIP (MANUAL ENTRY)
Bilirubin, UA: NEGATIVE
Glucose, UA: NEGATIVE mg/dL
Nitrite, UA: POSITIVE — AB
Protein Ur, POC: >=300 mg/dL — AB
Spec Grav, UA: 1.025 (ref 1.010–1.025)
Urobilinogen, UA: 2 U/dL — AB
pH, UA: 5.5 (ref 5.0–8.0)

## 2022-12-29 LAB — CBC
HCT: 35.3 % — ABNORMAL LOW (ref 36.0–46.0)
Hemoglobin: 10.7 g/dL — ABNORMAL LOW (ref 12.0–15.0)
MCH: 20.8 pg — ABNORMAL LOW (ref 26.0–34.0)
MCHC: 30.3 g/dL (ref 30.0–36.0)
MCV: 68.5 fL — ABNORMAL LOW (ref 80.0–100.0)
Platelets: 371 10*3/uL (ref 150–400)
RBC: 5.15 MIL/uL — ABNORMAL HIGH (ref 3.87–5.11)
RDW: 18.7 % — ABNORMAL HIGH (ref 11.5–15.5)
WBC: 7 10*3/uL (ref 4.0–10.5)
nRBC: 0 % (ref 0.0–0.2)

## 2022-12-29 LAB — LIPASE, BLOOD: Lipase: 202 U/L — ABNORMAL HIGH (ref 11–51)

## 2022-12-29 LAB — POCT URINE PREGNANCY: Preg Test, Ur: NEGATIVE

## 2022-12-29 MED ORDER — ONDANSETRON HCL 4 MG PO TABS: 4.0000 mg | ORAL_TABLET | Freq: Four times a day (QID) | ORAL | 0 refills | Status: DC

## 2022-12-29 MED ORDER — ONDANSETRON HCL 4 MG/2ML IJ SOLN
4.0000 mg | Freq: Once | INTRAMUSCULAR | Status: AC
  Administered 2022-12-29: 4 mg via INTRAVENOUS
  Filled 2022-12-29: qty 2

## 2022-12-29 MED ORDER — KETOROLAC TROMETHAMINE 30 MG/ML IJ SOLN
30.0000 mg | Freq: Once | INTRAMUSCULAR | Status: AC
  Administered 2022-12-29: 30 mg via INTRAVENOUS
  Filled 2022-12-29: qty 1

## 2022-12-29 MED ORDER — FENTANYL CITRATE PF 50 MCG/ML IJ SOSY
25.0000 ug | PREFILLED_SYRINGE | Freq: Once | INTRAMUSCULAR | Status: AC
  Administered 2022-12-29: 25 ug via INTRAVENOUS
  Filled 2022-12-29: qty 1

## 2022-12-29 MED ORDER — MORPHINE SULFATE (PF) 4 MG/ML IV SOLN
4.0000 mg | Freq: Once | INTRAVENOUS | Status: AC
  Administered 2022-12-29: 4 mg via INTRAVENOUS
  Filled 2022-12-29: qty 1

## 2022-12-29 MED ORDER — MORPHINE SULFATE (PF) 2 MG/ML IV SOLN: 2.0000 mg | Freq: Once | INTRAVENOUS | Status: DC

## 2022-12-29 MED ORDER — IOHEXOL 300 MG/ML  SOLN
100.0000 mL | Freq: Once | INTRAMUSCULAR | Status: AC | PRN
  Administered 2022-12-29: 100 mL via INTRAVENOUS

## 2022-12-29 NOTE — ED Triage Notes (Signed)
Pt presents via POV c/o headache, back pain, abd cramping, and nausea. Reports she thought she was pregnant however menstrual cycle began on Monday. Ambulatory to triage. A&O x4.

## 2022-12-29 NOTE — ED Provider Notes (Signed)
[place] EMERGENCY DEPARTMENT AT [facility] Provider Note   CSN: [id number] Arrival date & time: 12/29/22  1945     History  Chief Complaint  Patient presents with   Headache   Back Pain    Katrina Stewart is a 27 y.o. female who presents emergency department complaining of headache, back pain, abdominal cramping, nausea and vomiting for the past week.  Patient went to urgent care and was sent to ER for further evaluation.  Patient states that she thought she was pregnant, however her menstrual cycle started yesterday.   Headache Associated symptoms: abdominal pain, back pain, nausea and vomiting   Back Pain Associated symptoms: abdominal pain and headaches        Home Medications Prior to Admission medications   Medication Sig Start Date End Date Taking? Authorizing Provider  ondansetron (ZOFRAN) 4 MG tablet Take 1 tablet (4 mg total) by mouth every 6 (six) hours. 12/29/22  Yes Maven Rosander T, PA-C  FLUoxetine (PROZAC) 20 MG capsule Take 3 capsules (60 mg total) by mouth daily. 11/23/22 11/23/23  Georganna Skeans, MD  Iron, Ferrous Sulfate, 325 (65 Fe) MG TABS Take 325 mg by mouth 2 (two) times daily. Patient not taking: Reported on 12/29/2022 12/23/22   Georganna Skeans, MD  metFORMIN (GLUCOPHAGE-XR) 500 MG 24 hr tablet Take 1 tablet (500 mg total) by mouth daily with breakfast. Patient not taking: Reported on 10/01/2022 02/12/22   Georganna Skeans, MD  phentermine (ADIPEX-P) 37.5 MG tablet Take 1 tablet (37.5 mg total) by mouth daily before breakfast. 12/07/22   Georganna Skeans, MD  QUEtiapine (SEROQUEL) 300 MG tablet Take 1 tablet (300 mg total) by mouth at bedtime. 12/02/22 01/31/23  Lamar Sprinkles, MD  Vitamin D, Ergocalciferol, (DRISDOL) 1.25 MG (50000 UNIT) CAPS capsule Take 1 capsule (50,000 Units total) by mouth every 7 (seven) days. Patient not taking: Reported on 12/29/2022 12/23/22   Georganna Skeans, MD      Allergies    Patient has no known  allergies.    Review of Systems   Review of Systems  Gastrointestinal:  Positive for abdominal pain, nausea and vomiting.  Genitourinary:  Positive for vaginal bleeding.  Musculoskeletal:  Positive for back pain.  Neurological:  Positive for headaches.  All other systems reviewed and are negative.   Physical Exam Updated Vital Signs BP 123/79 (BP Location: Right Arm)   Pulse 76   Temp 98.5 F (36.9 C) (Oral)   Resp 18   Ht 5\' 9"  (1.753 m)   Wt 124.7 kg   LMP 12/27/2022   SpO2 100%   BMI 40.61 kg/m  Physical Exam Vitals and nursing note reviewed.  Constitutional:      Appearance: Normal appearance.  HENT:     Head: Normocephalic and atraumatic.  Eyes:     Conjunctiva/sclera: Conjunctivae normal.  Cardiovascular:     Rate and Rhythm: Normal rate and regular rhythm.  Pulmonary:     Effort: Pulmonary effort is normal. No respiratory distress.     Breath sounds: Normal breath sounds.  Abdominal:     General: There is no distension.     Palpations: Abdomen is soft.     Tenderness: There is abdominal tenderness in the right lower quadrant, suprapubic area and left lower quadrant. There is no right CVA tenderness or left CVA tenderness.  Musculoskeletal:     Comments: No midline spinal tenderness or deformities palpated, lower lumbar tenderness   Skin:    General: Skin is warm and dry.  Neurological:     General: No focal deficit present.     Mental Status: She is alert.     ED Results / Procedures / Treatments   Labs (all labs ordered are listed, but only abnormal results are displayed) Labs Reviewed  LIPASE, BLOOD - Abnormal; Notable for the following components:      Result Value   Lipase 202 (*)    All other components within normal limits  CBC - Abnormal; Notable for the following components:   RBC 5.15 (*)    Hemoglobin 10.7 (*)    HCT 35.3 (*)    MCV 68.5 (*)    MCH 20.8 (*)    RDW 18.7 (*)    All other components within normal limits  URINALYSIS,  ROUTINE W REFLEX MICROSCOPIC - Abnormal; Notable for the following components:   APPearance HAZY (*)    Hgb urine dipstick LARGE (*)    Protein, ur 100 (*)    Leukocytes,Ua TRACE (*)    All other components within normal limits  RESP PANEL BY RT-PCR (RSV, FLU A&B, COVID)  RVPGX2  COMPREHENSIVE METABOLIC PANEL  HCG, SERUM, QUALITATIVE    EKG None  Radiology CT ABDOMEN PELVIS W CONTRAST  Result Date: 12/29/2022 CLINICAL DATA:  Nausea vomiting abdominal pain EXAM: CT ABDOMEN AND PELVIS WITH CONTRAST TECHNIQUE: Multidetector CT imaging of the abdomen and pelvis was performed using the standard protocol following bolus administration of intravenous contrast. RADIATION DOSE REDUCTION: This exam was performed according to the departmental dose-optimization program which includes automated exposure control, adjustment of the mA and/or kV according to patient size and/or use of iterative reconstruction technique. CONTRAST:  OMNIPAQUE IOHEXOL 300 MG/ML  SOLN COMPARISON:  None Available. FINDINGS: Lower chest: No acute abnormality. Hepatobiliary: No focal liver abnormality is seen. No gallstones, gallbladder wall thickening, or biliary dilatation. Pancreas: Unremarkable. No pancreatic ductal dilatation or surrounding inflammatory changes. Spleen: Normal in size without focal abnormality. Adrenals/Urinary Tract: Adrenal glands are unremarkable. Kidneys are normal, without renal calculi, focal lesion, or hydronephrosis. Bladder is unremarkable. Stomach/Bowel: Stomach is within normal limits. Appendix appears normal. No evidence of bowel wall thickening, distention, or inflammatory changes. Vascular/Lymphatic: No significant vascular findings are present. No enlarged abdominal or pelvic lymph nodes. Reproductive: Uterus and bilateral adnexa are unremarkable. Other: No abdominal wall hernia or abnormality. No abdominopelvic ascites. Musculoskeletal: No acute or significant osseous findings. IMPRESSION:  Negative CT of the abdomen and pelvis. Electronically Signed   By: Jasmine Pang M.D.   On: 12/29/2022 22:05    Procedures Procedures    Medications Ordered in ED Medications  ondansetron (ZOFRAN) injection 4 mg (4 mg Intravenous Given 12/29/22 2113)  morphine (PF) 4 MG/ML injection 4 mg (4 mg Intravenous Given 12/29/22 2113)  iohexol (OMNIPAQUE) 300 MG/ML solution 100 mL (100 mLs Intravenous Contrast Given 12/29/22 2130)  ketorolac (TORADOL) 30 MG/ML injection 30 mg (30 mg Intravenous Given 12/29/22 2322)  fentaNYL (SUBLIMAZE) injection 25 mcg (25 mcg Intravenous Given 12/29/22 2322)    ED Course/ Medical Decision Making/ A&P Clinical Course as of 12/29/22 2345  Wed Dec 29, 2022  2316 Patient states pain improved with medications but has returned, is worse in her lower back. Feel we have ruled out most emergent etiologies with reassuring CT scan, but will obtain pelvic US to fully rule out possible torsion.  [LR]    Clinical Course User Index [LR] Fleetwood Pierron T, PA-C  Medical Decision Making Amount and/or Complexity of Data Reviewed Labs: ordered. Radiology: ordered.  Risk Prescription drug management.   This patient is a 27 y.o. female  who presents to the ED for concern of abdominal cramping, nausea, vomiting, back pain.   Differential diagnoses prior to evaluation: The emergent differential diagnosis includes, but is not limited to,  pregnancy, ectopic pregnancy, PID, TOA, UTI, kidney stone, viral illness, appendicitis, gastroenteritis, colitis, pancreatitis. This is not an exhaustive differential.   Past Medical History / Co-morbidities / Social History: Depression, hypertension, gestational diabetes, bipolar affective disorder, insomnia, PTSD  Additional history: Chart reviewed. Pertinent results include: Reviewed urgent care note  Physical Exam: Physical exam performed. The pertinent findings include: Hypertensive, otherwise  normal vital signs.  No acute distress.  Lab Tests/Imaging studies: I personally interpreted labs/imaging and the pertinent results include:  no leukocytosis, hemoglobin stable. CMP normal. Lipase 202. Negative pregnancy.   UA with large hemoglobin, negative nitrite, trace leukocytes, <10 WBCs, and no bacteria seen.   CT abd/pelvis without acute abnormalities. I agree with the radiologist interpretation.  Medications: I ordered medication including morphine and zofran.  I have reviewed the patients home medicines and have made adjustments as needed.   Disposition: Patient discussed and care transferred to [facility] at shift change. Please see his/her note for further details regarding further ED course and disposition. Plan at time of handoff is follow up on US imaging. Anticipate if normal, patient can dc to home with symptomatic management. Zofran prescription sent to pharmacy.    Final Clinical Impression(s) / ED Diagnoses Final diagnoses:  Elevated lipase  Acute bilateral low back pain without sciatica  Lower abdominal pain    Rx / DC Orders ED Discharge Orders          Ordered    ondansetron (ZOFRAN) 4 MG tablet  Every 6 hours        12/29/22 2323           Portions of this report may have been transcribed using voice recognition software. Every effort was made to ensure accuracy; however, inadvertent computerized transcription errors may be present.    Jeanella Flattery 12/29/22 2351    Maia Plan, MD 01/02/23 1620

## 2022-12-29 NOTE — Discharge Instructions (Addendum)
You were seen in the emergency department for lower abdominal pain, back pain, and headache.  As we discussed your pancreas testing was elevated today, which could be in the setting of pancreatitis.  However your CT scan did not show any emergent findings.  I suspect that your symptoms are related to some kind of viral infection.  I have sent some nausea medication to your pharmacy.  Can take ibuprofen/or Tylenol as needed for pain.  Continue to monitor how you're doing and return to the ER for new or worsening symptoms such as worsening pain, inability to keep down fluids or medications.

## 2022-12-29 NOTE — ED Provider Notes (Addendum)
Patient here today for evaluation of abdominal pain, low back pain, nausea vomiting that seems like similar symptoms prior pregnancy.  She is currently having vaginal bleeding however.  Pregnancy test negative in office.  UA difficult to read due to blood.  Recommended further evaluation in the emergency room for stat labs and imaging and patient is agreeable to same.  Has someone to transport her via POV.   Tomi Bamberger, PA-C 12/29/22 1915    Tomi Bamberger, PA-C 01/03/23 [phone]

## 2022-12-29 NOTE — ED Triage Notes (Signed)
Pt reports N/V with abdominal and low back pain x1 week. Pt states she thought she might be pregnant as food smells were making her sick but her period came on Monday.

## 2022-12-29 NOTE — ED Notes (Signed)
Patient is being discharged from the Urgent Care and sent to the Emergency Department via PMV . Per Corinna Capra., patient is in need of higher level of care due to abdominal pain needing further testing. Patient is aware and verbalizes understanding of plan of care.  Vitals:   12/29/22 1742  BP: 133/89  Pulse: 96  Temp: 98.4 F (36.9 C)  SpO2: 98%

## 2022-12-30 ENCOUNTER — Other Ambulatory Visit (HOSPITAL_COMMUNITY): Payer: Medicaid Other

## 2022-12-30 DIAGNOSIS — Z79899 Other long term (current) drug therapy: Secondary | ICD-10-CM | POA: Diagnosis not present

## 2022-12-30 DIAGNOSIS — F419 Anxiety disorder, unspecified: Secondary | ICD-10-CM

## 2022-12-30 DIAGNOSIS — K852 Alcohol induced acute pancreatitis without necrosis or infection: Secondary | ICD-10-CM | POA: Diagnosis not present

## 2022-12-30 DIAGNOSIS — F319 Bipolar disorder, unspecified: Secondary | ICD-10-CM | POA: Diagnosis present

## 2022-12-30 DIAGNOSIS — K859 Acute pancreatitis without necrosis or infection, unspecified: Secondary | ICD-10-CM | POA: Diagnosis present

## 2022-12-30 DIAGNOSIS — R748 Abnormal levels of other serum enzymes: Secondary | ICD-10-CM | POA: Diagnosis not present

## 2022-12-30 DIAGNOSIS — Z8052 Family history of malignant neoplasm of bladder: Secondary | ICD-10-CM | POA: Diagnosis not present

## 2022-12-30 DIAGNOSIS — F32A Depression, unspecified: Secondary | ICD-10-CM

## 2022-12-30 DIAGNOSIS — K5900 Constipation, unspecified: Secondary | ICD-10-CM | POA: Diagnosis present

## 2022-12-30 DIAGNOSIS — Z825 Family history of asthma and other chronic lower respiratory diseases: Secondary | ICD-10-CM | POA: Diagnosis not present

## 2022-12-30 DIAGNOSIS — D509 Iron deficiency anemia, unspecified: Secondary | ICD-10-CM | POA: Diagnosis present

## 2022-12-30 DIAGNOSIS — F1010 Alcohol abuse, uncomplicated: Secondary | ICD-10-CM

## 2022-12-30 DIAGNOSIS — E871 Hypo-osmolality and hyponatremia: Secondary | ICD-10-CM | POA: Diagnosis present

## 2022-12-30 DIAGNOSIS — E876 Hypokalemia: Secondary | ICD-10-CM | POA: Diagnosis present

## 2022-12-30 DIAGNOSIS — I10 Essential (primary) hypertension: Secondary | ICD-10-CM | POA: Diagnosis present

## 2022-12-30 DIAGNOSIS — Z8616 Personal history of COVID-19: Secondary | ICD-10-CM | POA: Diagnosis not present

## 2022-12-30 DIAGNOSIS — Z1152 Encounter for screening for COVID-19: Secondary | ICD-10-CM | POA: Diagnosis not present

## 2022-12-30 DIAGNOSIS — R1030 Lower abdominal pain, unspecified: Secondary | ICD-10-CM

## 2022-12-30 DIAGNOSIS — Z801 Family history of malignant neoplasm of trachea, bronchus and lung: Secondary | ICD-10-CM | POA: Diagnosis not present

## 2022-12-30 HISTORY — DX: Acute pancreatitis without necrosis or infection, unspecified: K85.90

## 2022-12-30 HISTORY — DX: Alcohol abuse, uncomplicated: F10.10

## 2022-12-30 HISTORY — DX: Depression, unspecified: F32.A

## 2022-12-30 LAB — GLUCOSE, CAPILLARY
Glucose-Capillary: 76 mg/dL (ref 70–99)
Glucose-Capillary: 92 mg/dL (ref 70–99)

## 2022-12-30 LAB — BASIC METABOLIC PANEL
Anion gap: 8 (ref 5–15)
BUN: 9 mg/dL (ref 6–20)
CO2: 24 mmol/L (ref 22–32)
Calcium: 8.6 mg/dL — ABNORMAL LOW (ref 8.9–10.3)
Chloride: 102 mmol/L (ref 98–111)
Creatinine, Ser: 0.78 mg/dL (ref 0.44–1.00)
GFR, Estimated: >60 mL/min (ref >60)
Glucose, Bld: 84 mg/dL (ref 70–99)
Potassium: 3.2 mmol/L — ABNORMAL LOW (ref 3.5–5.1)
Sodium: 134 mmol/L — ABNORMAL LOW (ref 135–145)

## 2022-12-30 LAB — RESP PANEL BY RT-PCR (RSV, FLU A&B, COVID)  RVPGX2
Influenza A by PCR: NEGATIVE
Influenza B by PCR: NEGATIVE
Resp Syncytial Virus by PCR: NEGATIVE
SARS Coronavirus 2 by RT PCR: NEGATIVE

## 2022-12-30 LAB — CBC
HCT: 33.2 % — ABNORMAL LOW (ref 36.0–46.0)
Hemoglobin: 9.8 g/dL — ABNORMAL LOW (ref 12.0–15.0)
MCH: 20.7 pg — ABNORMAL LOW (ref 26.0–34.0)
MCHC: 29.5 g/dL — ABNORMAL LOW (ref 30.0–36.0)
MCV: 70 fL — ABNORMAL LOW (ref 80.0–100.0)
Platelets: 350 10*3/uL (ref 150–400)
RBC: 4.74 MIL/uL (ref 3.87–5.11)
RDW: 18.2 % — ABNORMAL HIGH (ref 11.5–15.5)
WBC: 6 10*3/uL (ref 4.0–10.5)
nRBC: 0 % (ref 0.0–0.2)

## 2022-12-30 LAB — TRIGLYCERIDES: Triglycerides: 49 mg/dL (ref <150)

## 2022-12-30 LAB — HEPATIC FUNCTION PANEL
ALT: 13 U/L (ref 0–44)
AST: 14 U/L — ABNORMAL LOW (ref 15–41)
Albumin: 3.7 g/dL (ref 3.5–5.0)
Alkaline Phosphatase: 58 U/L (ref 38–126)
Bilirubin, Direct: <0.1 mg/dL (ref 0.0–0.2)
Total Bilirubin: 0.5 mg/dL (ref <1.2)
Total Protein: 6.9 g/dL (ref 6.5–8.1)

## 2022-12-30 MED ORDER — FLUOXETINE HCL 20 MG PO CAPS
60.0000 mg | ORAL_CAPSULE | Freq: Every day | ORAL | Status: DC
  Administered 2022-12-30 – 2023-01-02 (×4): 60 mg via ORAL
  Filled 2022-12-30 (×5): qty 3

## 2022-12-30 MED ORDER — PANTOPRAZOLE SODIUM 40 MG IV SOLR
40.0000 mg | Freq: Two times a day (BID) | INTRAVENOUS | Status: DC
  Administered 2022-12-30 – 2023-01-02 (×7): 40 mg via INTRAVENOUS
  Filled 2022-12-30 (×7): qty 10

## 2022-12-30 MED ORDER — LACTATED RINGERS IV SOLN
INTRAVENOUS | Status: AC
Start: 2022-12-30 — End: 2022-12-31

## 2022-12-30 MED ORDER — LORAZEPAM 2 MG/ML IJ SOLN: 0.0000 mg | Freq: Four times a day (QID) | INTRAMUSCULAR | Status: AC

## 2022-12-30 MED ORDER — OXYCODONE HCL 5 MG PO TABS
5.0000 mg | ORAL_TABLET | ORAL | Status: DC | PRN
  Administered 2022-12-30 – 2023-01-02 (×8): 5 mg via ORAL
  Filled 2022-12-30 (×8): qty 1

## 2022-12-30 MED ORDER — HYDROMORPHONE HCL 1 MG/ML IJ SOLN
0.5000 mg | INTRAMUSCULAR | Status: DC | PRN
  Administered 2022-12-30 – 2023-01-01 (×4): 1 mg via INTRAVENOUS
  Filled 2022-12-30 (×6): qty 1

## 2022-12-30 MED ORDER — HYDROMORPHONE HCL 1 MG/ML IJ SOLN
0.5000 mg | Freq: Once | INTRAMUSCULAR | Status: AC
  Administered 2022-12-30: 0.5 mg via INTRAVENOUS
  Filled 2022-12-30: qty 1

## 2022-12-30 MED ORDER — ACETAMINOPHEN 325 MG PO TABS
650.0000 mg | ORAL_TABLET | Freq: Four times a day (QID) | ORAL | Status: DC | PRN
  Administered 2023-01-01: 650 mg via ORAL
  Filled 2022-12-30: qty 2

## 2022-12-30 MED ORDER — LORAZEPAM 1 MG PO TABS: 1.0000 mg | ORAL_TABLET | ORAL | Status: AC | PRN

## 2022-12-30 MED ORDER — LORAZEPAM 2 MG/ML IJ SOLN: 0.0000 mg | Freq: Two times a day (BID) | INTRAMUSCULAR | Status: DC

## 2022-12-30 MED ORDER — LACTATED RINGERS IV BOLUS
1000.0000 mL | Freq: Once | INTRAVENOUS | Status: AC
  Administered 2022-12-30: 1000 mL via INTRAVENOUS

## 2022-12-30 MED ORDER — ENOXAPARIN SODIUM 40 MG/0.4ML IJ SOSY
40.0000 mg | PREFILLED_SYRINGE | INTRAMUSCULAR | Status: DC
  Administered 2022-12-30 – 2023-01-01 (×3): 40 mg via SUBCUTANEOUS
  Filled 2022-12-30 (×3): qty 0.4

## 2022-12-30 MED ORDER — LACTATED RINGERS IV BOLUS: 1000.0000 mL | Freq: Once | INTRAVENOUS | Status: DC

## 2022-12-30 MED ORDER — QUETIAPINE FUMARATE 50 MG PO TABS
300.0000 mg | ORAL_TABLET | Freq: Every day | ORAL | Status: DC
  Administered 2022-12-30 – 2023-01-01 (×3): 300 mg via ORAL
  Filled 2022-12-30 (×3): qty 6

## 2022-12-30 MED ORDER — FOLIC ACID 1 MG PO TABS
1.0000 mg | ORAL_TABLET | Freq: Every day | ORAL | Status: DC
  Administered 2022-12-30 – 2023-01-02 (×4): 1 mg via ORAL
  Filled 2022-12-30 (×4): qty 1

## 2022-12-30 MED ORDER — ONDANSETRON HCL 4 MG PO TABS
4.0000 mg | ORAL_TABLET | Freq: Four times a day (QID) | ORAL | Status: DC | PRN
  Administered 2022-12-30: 4 mg via ORAL
  Filled 2022-12-30: qty 1

## 2022-12-30 MED ORDER — POTASSIUM CHLORIDE 10 MEQ/100ML IV SOLN
10.0000 meq | INTRAVENOUS | Status: AC
  Administered 2022-12-30 (×4): 10 meq via INTRAVENOUS
  Filled 2022-12-30: qty 100

## 2022-12-30 MED ORDER — ACETAMINOPHEN 650 MG RE SUPP: 650.0000 mg | Freq: Four times a day (QID) | RECTAL | Status: DC | PRN

## 2022-12-30 MED ORDER — THIAMINE HCL 100 MG/ML IJ SOLN
100.0000 mg | Freq: Every day | INTRAMUSCULAR | Status: DC
  Filled 2022-12-30: qty 2

## 2022-12-30 MED ORDER — LORAZEPAM 2 MG/ML IJ SOLN: 1.0000 mg | INTRAMUSCULAR | Status: AC | PRN

## 2022-12-30 MED ORDER — BISACODYL 5 MG PO TBEC
5.0000 mg | DELAYED_RELEASE_TABLET | Freq: Once | ORAL | Status: AC
  Administered 2022-12-30: 5 mg via ORAL
  Filled 2022-12-30: qty 1

## 2022-12-30 MED ORDER — PROCHLORPERAZINE EDISYLATE 10 MG/2ML IJ SOLN
5.0000 mg | Freq: Four times a day (QID) | INTRAMUSCULAR | Status: DC | PRN
  Administered 2022-12-30: 5 mg via INTRAVENOUS
  Filled 2022-12-30: qty 2

## 2022-12-30 MED ORDER — DIPHENHYDRAMINE HCL 25 MG PO CAPS
25.0000 mg | ORAL_CAPSULE | Freq: Three times a day (TID) | ORAL | Status: DC | PRN
  Administered 2022-12-30 (×2): 25 mg via ORAL
  Filled 2022-12-30 (×2): qty 1

## 2022-12-30 MED ORDER — HYDRALAZINE HCL 20 MG/ML IJ SOLN: 10.0000 mg | Freq: Four times a day (QID) | INTRAMUSCULAR | Status: DC | PRN

## 2022-12-30 MED ORDER — THIAMINE MONONITRATE 100 MG PO TABS
100.0000 mg | ORAL_TABLET | Freq: Every day | ORAL | Status: DC
  Administered 2022-12-30 – 2023-01-02 (×4): 100 mg via ORAL
  Filled 2022-12-30 (×4): qty 1

## 2022-12-30 MED ORDER — ADULT MULTIVITAMIN W/MINERALS CH
1.0000 | ORAL_TABLET | Freq: Every day | ORAL | Status: DC
  Administered 2022-12-30 – 2023-01-02 (×4): 1 via ORAL
  Filled 2022-12-30 (×4): qty 1

## 2022-12-30 MED ORDER — ONDANSETRON HCL 4 MG/2ML IJ SOLN: 4.0000 mg | Freq: Four times a day (QID) | INTRAMUSCULAR | Status: DC | PRN

## 2022-12-30 NOTE — H&P (Signed)
History and Physical    Katrina Stewart HYQ:[id number] DOB: 28-Sep-1995 DOA: 12/29/2022  PCP: Georganna Skeans, MD   Patient coming from: Home   Chief Complaint: Abdominal pain, back pain, N/V   HPI: Katrina Stewart is a 27 y.o. female with medical history significant for depression, anxiety, BMI 41, and alcohol abuse who presents with abdominal pain, back pain, nausea, and vomiting.  Patient reports approximately 1 week of abdominal pain, back pain, nausea, and nonbloody vomiting.  Symptoms have not necessarily worsened over this interval but are not improving.  There has not been any fever, chills, diarrhea, or urinary symptoms associated with this.  She denies any history of gallbladder disease.  She reports drinking 1/5 gallon of liquor daily.  She denies history of alcohol withdrawal.  ED Course: Upon arrival to the ED, patient is found to be afebrile and saturating well on room air with normal heart rate and elevated blood pressure.  Labs are most notable for normal renal function, normal WBC, and lipase 202.  CT of the abdomen pelvis is negative for acute findings.  Right ovary was not visualized on pelvic ultrasound which was otherwise normal.  Patient was treated with 2 L of LR, fentanyl, Dilaudid, morphine, Toradol, and Zofran.  Review of Systems:  All other systems reviewed and apart from HPI, are negative.  Past Medical History:  Diagnosis Date   Anxiety    COVID-19 virus infection 12/05/2018   + again on 2/4 (asymptomatic)  postiive test in [facility] on 11-2   Depression    Depression    Phreesia 12/17/2019   Depression    Phreesia 12/30/2019   Essential hypertension    Gestational diabetes mellitus (GDM)    Normal postpartum 2 hr GTT   Group B streptococcal infection in pregnancy 03/13/2019   Rh negative state in antepartum period 09/03/2015   [x]  needs rhogam at 28 weeks  [name] postive anti-D on initial lab. Patient received rhogam on 08/02/18 during [facility] visit   Rubella  non-immune status, antepartum 09/03/2015   Vaginal Pap smear, abnormal     Past Surgical History:  Procedure Laterality Date   BREAST SURGERY  2013   Lt & Rt excision juvenile fibroadenoma   MASS EXCISION  10/29/2011   Procedure: EXCISION MASS;  Surgeon: Shelly Rubenstein, MD;  Location: [facility];  Service: General;  Laterality: Bilateral;  excision of bilateral breast masses    Social History:   reports that she has never smoked. She has never been exposed to tobacco smoke. She has never used smokeless tobacco. She reports current alcohol use of about 2.0 - 3.0 standard drinks of alcohol per week. She reports that she does not use drugs.  No Known Allergies  Family History  Problem Relation Age of Onset   Cancer Maternal Grandmother        lung cancer   Cancer Other        bladder cancer   Asthma Brother      Prior to Admission medications   Medication Sig Start Date End Date Taking? Authorizing Provider  FLUoxetine (PROZAC) 20 MG capsule Take 3 capsules (60 mg total) by mouth daily. 11/23/22 11/23/23 Yes Georganna Skeans, MD  ondansetron (ZOFRAN) 4 MG tablet Take 1 tablet (4 mg total) by mouth every 6 (six) hours. 12/29/22  Yes Roemhildt, Lorin T, PA-C  phentermine (ADIPEX-P) 37.5 MG tablet Take 1 tablet (37.5 mg total) by mouth daily before breakfast. 12/07/22  Yes Georganna Skeans, MD  QUEtiapine (SEROQUEL) 300 MG  tablet Take 1 tablet (300 mg total) by mouth at bedtime. 12/02/22 01/31/23 Yes CosbyToni Amend, MD  Iron, Ferrous Sulfate, 325 (65 Fe) MG TABS Take 325 mg by mouth 2 (two) times daily. Patient not taking: Reported on 12/29/2022 12/23/22   Georganna Skeans, MD    Physical Exam: Vitals:   12/30/22 0200 12/30/22 0215 12/30/22 0230 12/30/22 0318  BP: (!) 165/101 (!) 161/100 (!) 163/95 (!) 148/92  Pulse: 88 84 94 81  Resp:   18 17  Temp:   98.1 F (36.7 C) 98.3 F (36.8 C)  TempSrc:   Oral   SpO2: 99% 99% 99% 100%  Weight:      Height:        Constitutional: NAD, no  pallor or diaphoresis   Eyes: PERTLA, lids and conjunctivae normal ENMT: Mucous membranes are moist. Posterior pharynx clear of any exudate or lesions.   Neck: supple, no masses  Respiratory: no wheezing, no crackles. No accessory muscle use.  Cardiovascular: S1 & S2 heard, regular rate and rhythm. No extremity edema.  Abdomen: Soft, no distension, most tender in epigastrium. Bowel sounds active.  Musculoskeletal: no clubbing / cyanosis. No joint deformity upper and lower extremities.   Skin: no significant rashes, lesions, ulcers. Warm, dry, well-perfused. Neurologic: CN 2-12 grossly intact. Moving all extremities. Alert and oriented.  Psychiatric: Pleasant. Cooperative.    Labs and Imaging on Admission: I have personally reviewed following labs and imaging studies  CBC: Recent Labs  Lab 12/29/22 2022  WBC 7.0  HGB 10.7*  HCT 35.3*  MCV 68.5*  PLT 371   Basic Metabolic Panel: Recent Labs  Lab 12/29/22 2022  NA 137  K 3.7  CL 106  CO2 22  GLUCOSE 98  BUN 12  CREATININE 0.83  CALCIUM 9.3   GFR: Estimated Creatinine Clearance: 141.5 mL/min (by C-G formula based on SCr of 0.83 mg/dL). Liver Function Tests: Recent Labs  Lab 12/29/22 2022  AST 17  ALT 13  ALKPHOS 66  BILITOT 0.6  PROT 8.1  ALBUMIN 4.2   Recent Labs  Lab 12/29/22 2022  LIPASE 202*   No results for input(s): "AMMONIA" in the last 168 hours. Coagulation Profile: No results for input(s): "INR", "PROTIME" in the last 168 hours. Cardiac Enzymes: No results for input(s): "CKTOTAL", "CKMB", "CKMBINDEX", "TROPONINI" in the last 168 hours. BNP (last 3 results) No results for input(s): "PROBNP" in the last 8760 hours. HbA1C: No results for input(s): "HGBA1C" in the last 72 hours. CBG: No results for input(s): "GLUCAP" in the last 168 hours. Lipid Profile: No results for input(s): "CHOL", "HDL", "LDLCALC", "TRIG", "CHOLHDL", "LDLDIRECT" in the last 72 hours. Thyroid Function Tests: No results for  input(s): "TSH", "T4TOTAL", "FREET4", "T3FREE", "THYROIDAB" in the last 72 hours. Anemia Panel: No results for input(s): "VITAMINB12", "FOLATE", "FERRITIN", "TIBC", "IRON", "RETICCTPCT" in the last 72 hours. Urine analysis:    Component Value Date/Time   COLORURINE YELLOW 12/29/2022 2009   APPEARANCEUR HAZY (A) 12/29/2022 2009   LABSPEC 1.021 12/29/2022 2009   PHURINE 5.0 12/29/2022 2009   GLUCOSEU NEGATIVE 12/29/2022 2009   HGBUR LARGE (A) 12/29/2022 2009   BILIRUBINUR NEGATIVE 12/29/2022 2009   BILIRUBINUR negative 12/29/2022 1806   KETONESUR NEGATIVE 12/29/2022 2009   KETONESUR small (15) (A) 12/29/2022 1806   PROTEINUR 100 (A) 12/29/2022 2009   PROTEINUR >=300 (A) 12/29/2022 1806   UROBILINOGEN 2.0 (A) 12/29/2022 1806   UROBILINOGEN 0.2 09/25/2015 0942   NITRITE NEGATIVE 12/29/2022 2009   NITRITE Positive (A)  12/29/2022 1806   LEUKOCYTESUR TRACE (A) 12/29/2022 2009   LEUKOCYTESUR Large (3+) (A) 12/29/2022 1806   Sepsis Labs: @LABRCNTIP (procalcitonin:4,lacticidven:4) ) Recent Results (from the past 240 hour(s))  Resp panel by RT-PCR (RSV, Flu A&B, Covid) Anterior Nasal Swab     Status: None   Collection Time: 12/29/22 11:25 PM   Specimen: Anterior Nasal Swab  Result Value Ref Range Status   SARS Coronavirus 2 by RT PCR NEGATIVE NEGATIVE Final    Comment: (NOTE) SARS-CoV-2 target nucleic acids are NOT DETECTED.  The SARS-CoV-2 RNA is generally detectable in upper respiratory specimens during the acute phase of infection. The lowest concentration of SARS-CoV-2 viral copies this assay can detect is 138 copies/mL. A negative result does not preclude SARS-Cov-2 infection and should not be used as the sole basis for treatment or other patient management decisions. A negative result may occur with  improper specimen collection/handling, submission of specimen other than nasopharyngeal swab, presence of viral mutation(s) within the areas targeted by this assay, and inadequate  number of viral copies(<138 copies/mL). A negative result must be combined with clinical observations, patient history, and epidemiological information. The expected result is Negative.  Fact Sheet for Patients:  BloggerCourse.com  Fact Sheet for Healthcare Providers:  SeriousBroker.it  This test is no t yet approved or cleared by the [place] FDA and  has been authorized for detection and/or diagnosis of SARS-CoV-2 by FDA under an Emergency Use Authorization (EUA). This EUA will remain  in effect (meaning this test can be used) for the duration of the COVID-19 declaration under Section 564(b)(1) of the Act, 21 U.S.C.section 360bbb-3(b)(1), unless the authorization is terminated  or revoked sooner.       Influenza A by PCR NEGATIVE NEGATIVE Final   Influenza B by PCR NEGATIVE NEGATIVE Final    Comment: (NOTE) The Xpert Xpress SARS-CoV-2/FLU/RSV plus assay is intended as an aid in the diagnosis of influenza from Nasopharyngeal swab specimens and should not be used as a sole basis for treatment. Nasal washings and aspirates are unacceptable for Xpert Xpress SARS-CoV-2/FLU/RSV testing.  Fact Sheet for Patients: BloggerCourse.com  Fact Sheet for Healthcare Providers: SeriousBroker.it  This test is not yet approved or cleared by the [place] FDA and has been authorized for detection and/or diagnosis of SARS-CoV-2 by FDA under an Emergency Use Authorization (EUA). This EUA will remain in effect (meaning this test can be used) for the duration of the COVID-19 declaration under Section 564(b)(1) of the Act, 21 U.S.C. section 360bbb-3(b)(1), unless the authorization is terminated or revoked.     Resp Syncytial Virus by PCR NEGATIVE NEGATIVE Final    Comment: (NOTE) Fact Sheet for Patients: BloggerCourse.com  Fact Sheet for Healthcare  Providers: SeriousBroker.it  This test is not yet approved or cleared by the [place] FDA and has been authorized for detection and/or diagnosis of SARS-CoV-2 by FDA under an Emergency Use Authorization (EUA). This EUA will remain in effect (meaning this test can be used) for the duration of the COVID-19 declaration under Section 564(b)(1) of the Act, 21 U.S.C. section 360bbb-3(b)(1), unless the authorization is terminated or revoked.  Performed at [facility], [address], [place], [place] [address]      Radiological Exams on Admission: US Pelvis Complete  Result Date: 12/30/2022 CLINICAL DATA:  Lower abdominal pain EXAM: TRANSABDOMINAL AND TRANSVAGINAL ULTRASOUND OF PELVIS DOPPLER ULTRASOUND OF OVARIES TECHNIQUE: Both transabdominal and transvaginal ultrasound examinations of the pelvis were performed. Transabdominal technique was performed for global  imaging of the pelvis including uterus, ovaries, adnexal regions, and pelvic cul-de-sac. It was necessary to proceed with endovaginal exam following the transabdominal exam to visualize the endometrium and ovaries bilaterally. Color and duplex Doppler ultrasound was utilized to evaluate blood flow to the ovaries. COMPARISON:  CT 12/29/2022 FINDINGS: Uterus Measurements: 10.2 x 5.6 x 6.3 cm = volume: 186 mL. The uterus is anteverted. The cervix is unremarkable. No fibroids or other mass visualized. Endometrium Thickness: 3 mm.  No focal abnormality visualized. Right ovary Not visualized.  No adnexal mass. Left ovary Measurements: 2.5 x 1.8 x 1.8 cm = volume: 4 mL. Normal appearance/no adnexal mass. Pulsed Doppler evaluation of the visualized left ovary demonstrates normal low-resistance arterial and venous waveforms. Other findings No abnormal free fluid. IMPRESSION: 1. Nonvisualization of the right ovary. Otherwise normal pelvic sonogram. Electronically Signed   By: Helyn Numbers M.D.   On:  12/30/2022 01:13   US Transvaginal Non-OB  Result Date: 12/30/2022 CLINICAL DATA:  Lower abdominal pain EXAM: TRANSABDOMINAL AND TRANSVAGINAL ULTRASOUND OF PELVIS DOPPLER ULTRASOUND OF OVARIES TECHNIQUE: Both transabdominal and transvaginal ultrasound examinations of the pelvis were performed. Transabdominal technique was performed for global imaging of the pelvis including uterus, ovaries, adnexal regions, and pelvic cul-de-sac. It was necessary to proceed with endovaginal exam following the transabdominal exam to visualize the endometrium and ovaries bilaterally. Color and duplex Doppler ultrasound was utilized to evaluate blood flow to the ovaries. COMPARISON:  CT 12/29/2022 FINDINGS: Uterus Measurements: 10.2 x 5.6 x 6.3 cm = volume: 186 mL. The uterus is anteverted. The cervix is unremarkable. No fibroids or other mass visualized. Endometrium Thickness: 3 mm.  No focal abnormality visualized. Right ovary Not visualized.  No adnexal mass. Left ovary Measurements: 2.5 x 1.8 x 1.8 cm = volume: 4 mL. Normal appearance/no adnexal mass. Pulsed Doppler evaluation of the visualized left ovary demonstrates normal low-resistance arterial and venous waveforms. Other findings No abnormal free fluid. IMPRESSION: 1. Nonvisualization of the right ovary. Otherwise normal pelvic sonogram. Electronically Signed   By: Helyn Numbers M.D.   On: 12/30/2022 01:13   [place] Art/Ven Flow Abd Pelv Doppler  Result Date: 12/30/2022 CLINICAL DATA:  Lower abdominal pain EXAM: TRANSABDOMINAL AND TRANSVAGINAL ULTRASOUND OF PELVIS DOPPLER ULTRASOUND OF OVARIES TECHNIQUE: Both transabdominal and transvaginal ultrasound examinations of the pelvis were performed. Transabdominal technique was performed for global imaging of the pelvis including uterus, ovaries, adnexal regions, and pelvic cul-de-sac. It was necessary to proceed with endovaginal exam following the transabdominal exam to visualize the endometrium and ovaries bilaterally. Color  and duplex Doppler ultrasound was utilized to evaluate blood flow to the ovaries. COMPARISON:  CT 12/29/2022 FINDINGS: Uterus Measurements: 10.2 x 5.6 x 6.3 cm = volume: 186 mL. The uterus is anteverted. The cervix is unremarkable. No fibroids or other mass visualized. Endometrium Thickness: 3 mm.  No focal abnormality visualized. Right ovary Not visualized.  No adnexal mass. Left ovary Measurements: 2.5 x 1.8 x 1.8 cm = volume: 4 mL. Normal appearance/no adnexal mass. Pulsed Doppler evaluation of the visualized left ovary demonstrates normal low-resistance arterial and venous waveforms. Other findings No abnormal free fluid. IMPRESSION: 1. Nonvisualization of the right ovary. Otherwise normal pelvic sonogram. Electronically Signed   By: Helyn Numbers M.D.   On: 12/30/2022 01:13   CT ABDOMEN PELVIS W CONTRAST  Result Date: 12/29/2022 CLINICAL DATA:  Nausea vomiting abdominal pain EXAM: CT ABDOMEN AND PELVIS WITH CONTRAST TECHNIQUE: Multidetector CT imaging of the abdomen and pelvis was performed using the standard  protocol following bolus administration of intravenous contrast. RADIATION DOSE REDUCTION: This exam was performed according to the departmental dose-optimization program which includes automated exposure control, adjustment of the mA and/or kV according to patient size and/or use of iterative reconstruction technique. CONTRAST:  OMNIPAQUE IOHEXOL 300 MG/ML  SOLN COMPARISON:  None Available. FINDINGS: Lower chest: No acute abnormality. Hepatobiliary: No focal liver abnormality is seen. No gallstones, gallbladder wall thickening, or biliary dilatation. Pancreas: Unremarkable. No pancreatic ductal dilatation or surrounding inflammatory changes. Spleen: Normal in size without focal abnormality. Adrenals/Urinary Tract: Adrenal glands are unremarkable. Kidneys are normal, without renal calculi, focal lesion, or hydronephrosis. Bladder is unremarkable. Stomach/Bowel: Stomach is within normal limits.  Appendix appears normal. No evidence of bowel wall thickening, distention, or inflammatory changes. Vascular/Lymphatic: No significant vascular findings are present. No enlarged abdominal or pelvic lymph nodes. Reproductive: Uterus and bilateral adnexa are unremarkable. Other: No abdominal wall hernia or abnormality. No abdominopelvic ascites. Musculoskeletal: No acute or significant osseous findings. IMPRESSION: Negative CT of the abdomen and pelvis. Electronically Signed   By: Jasmine Pang M.D.   On: 12/29/2022 22:05    Assessment/Plan   1. Acute pancreatitis  - No gallstones seen on imaging and LFTs are normal  - Most likely from EtOH - Check triglyceride level, continue bowel rest, continue IVF hydration, and pain-control   2. Alcohol abuse   - Monitor with CIWA scoring, use Ativan as needed, supplement vitamins   3. Depression, anxiety  - Continue Prozac and Seroquel    DVT prophylaxis: Lovenox  Code Status: Full  Level of Care: Level of care: Med-Surg Family Communication: None present  Disposition Plan:  Patient is from: home  Anticipated d/c is to: Home  Anticipated d/c date is: 11/29 or 01/01/23  Patient currently: Pain-control, advancement of diet  Consults called: None  Admission status: Observation     Briscoe Deutscher, MD [facility]  12/30/2022, 4:58 AM

## 2022-12-30 NOTE — ED Provider Notes (Signed)
Patient here with abdominal pain, back pain, nausea, and vomiting.  Lipase is elevated at 202.  She still reports significant back pain and has notable tenderness to the epigastrium.  Will continue treatment with fluids and pain medication.  Feel that patient should be admitted for pancreatitis.    She states that she does drink a significant amount of ETOH.   Roxy Horseman, PA-C 12/30/22 0205    Dione Booze, MD 12/30/22 [phone]

## 2022-12-30 NOTE — ED Notes (Signed)
ED TO INPATIENT HANDOFF REPORT  ED Nurse Name and Phone #:     Alan Ripper [phone]  S Name/Age/Gender Katrina Stewart 27 y.o. female Room/Bed: WA15/WA15  Code Status   Code Status: Prior  Home/SNF/Other Home Patient oriented to: self, place, time, and situation Is this baseline? Yes   Triage Complete: Triage complete  Chief Complaint Acute pancreatitis [K85.90]  Triage Note Pt presents via POV c/o headache, back pain, abd cramping, and nausea. Reports she thought she was pregnant however menstrual cycle began on Monday. Ambulatory to triage. A&O x4.    Allergies No Known Allergies  Level of Care/Admitting Diagnosis ED Disposition     ED Disposition  Admit   Condition  --   Comment  Hospital Area: [facility] [[id number]]  Level of Care: Med-Surg [16]  May place patient in observation at [facility] or [facility] if equivalent level of care is available:: Yes  Covid Evaluation: Asymptomatic - no recent exposure (last 10 days) testing not required  Diagnosis: Acute pancreatitis [577.0.ICD-9-CM]  Admitting Physician: Briscoe Deutscher [[id number]]  Attending Physician: Briscoe Deutscher [[id number]]          B Medical/Surgery History Past Medical History:  Diagnosis Date   Anxiety    COVID-19 virus infection 12/05/2018   + again on 2/4 (asymptomatic)  postiive test in MAU on 11-2   Depression    Depression    Phreesia 12/17/2019   Depression    Phreesia 12/30/2019   Essential hypertension    Gestational diabetes mellitus (GDM)    Normal postpartum 2 hr GTT   Group B streptococcal infection in pregnancy 03/13/2019   Rh negative state in antepartum period 09/03/2015   [x]  needs rhogam at 28 weeks  [name] postive anti-D on initial lab. Patient received rhogam on 08/02/18 during MAU visit   Rubella non-immune status, antepartum 09/03/2015   Vaginal Pap smear, abnormal    Past Surgical History:  Procedure Laterality Date   BREAST SURGERY  2013   Lt & Rt  excision juvenile fibroadenoma   MASS EXCISION  10/29/2011   Procedure: EXCISION MASS;  Surgeon: Shelly Rubenstein, MD;  Location: [facility];  Service: General;  Laterality: Bilateral;  excision of bilateral breast masses     A IV Location/Drains/Wounds Patient Lines/Drains/Airways Status     Active Line/Drains/Airways     Name Placement date Placement time Site Days   Peripheral IV 12/29/22 20 G 1" Anterior;Left;Proximal Forearm 12/29/22  2113  Forearm  1            Intake/Output Last 24 hours No intake or output data in the 24 hours ending 12/30/22 9562  Labs/Imaging Results for orders placed or performed during the hospital encounter of 12/29/22 (from the past 48 hour(s))  Urinalysis, Routine w reflex microscopic -Urine, Clean Catch     Status: Abnormal   Collection Time: 12/29/22  8:09 PM  Result Value Ref Range   Color, Urine YELLOW YELLOW   APPearance HAZY (A) CLEAR   Specific Gravity, Urine 1.021 1.005 - 1.030   pH 5.0 5.0 - 8.0   Glucose, UA NEGATIVE NEGATIVE mg/dL   Hgb urine dipstick LARGE (A) NEGATIVE   Bilirubin Urine NEGATIVE NEGATIVE   Ketones, ur NEGATIVE NEGATIVE mg/dL   Protein, ur 130 (A) NEGATIVE mg/dL   Nitrite NEGATIVE NEGATIVE   Leukocytes,Ua TRACE (A) NEGATIVE   RBC / HPF >50 0 - 5 RBC/hpf   WBC, UA 6-10 0 - 5 WBC/hpf  Bacteria, UA NONE SEEN NONE SEEN   Squamous Epithelial / HPF 0-5 0 - 5 /HPF   Mucus PRESENT     Comment: Performed at [facility], [address] [place], [place] [address]  Lipase, blood     Status: Abnormal   Collection Time: 12/29/22  8:22 PM  Result Value Ref Range   Lipase 202 (H) 11 - 51 U/L    Comment: Performed at [facility], [address], [place], [place] [address]  Comprehensive metabolic panel     Status: None   Collection Time: 12/29/22  8:22 PM  Result Value Ref Range   Sodium 137 135 - 145 mmol/L   Potassium 3.7 3.5 - 5.1 mmol/L   Chloride 106 98 - 111 mmol/L   CO2 22  22 - 32 mmol/L   Glucose, Bld 98 70 - 99 mg/dL    Comment: Glucose reference range applies only to samples taken after fasting for at least 8 hours.   BUN 12 6 - 20 mg/dL   Creatinine, Ser 3.29 0.44 - 1.00 mg/dL   Calcium 9.3 8.9 - 51.8 mg/dL   Total Protein 8.1 6.5 - 8.1 g/dL   Albumin 4.2 3.5 - 5.0 g/dL   AST 17 15 - 41 U/L   ALT 13 0 - 44 U/L   Alkaline Phosphatase 66 38 - 126 U/L   Total Bilirubin 0.6 <1.2 mg/dL   GFR, Estimated >84 >16 mL/min    Comment: (NOTE) Calculated using the CKD-EPI Creatinine Equation (2021)    Anion gap 9 5 - 15    Comment: Performed at [facility], [address] [place], [place] [address]  CBC     Status: Abnormal   Collection Time: 12/29/22  8:22 PM  Result Value Ref Range   WBC 7.0 4.0 - 10.5 K/uL   RBC 5.15 (H) 3.87 - 5.11 MIL/uL   Hemoglobin 10.7 (L) 12.0 - 15.0 g/dL   HCT 16.0 (L) 10.9 - 32.3 %   MCV 68.5 (L) 80.0 - 100.0 fL   MCH 20.8 (L) 26.0 - 34.0 pg   MCHC 30.3 30.0 - 36.0 g/dL   RDW 55.7 (H) 32.2 - 02.5 %   Platelets 371 150 - 400 K/uL   nRBC 0.0 0.0 - 0.2 %    Comment: Performed at [facility], [address], [place], [place] [address]  hCG, serum, qualitative     Status: None   Collection Time: 12/29/22  8:22 PM  Result Value Ref Range   Preg, Serum NEGATIVE NEGATIVE    Comment:        THE SENSITIVITY OF THIS METHODOLOGY IS >10 mIU/mL. Performed at [facility], [address], [place], [place] [address]   Resp panel by RT-PCR (RSV, Flu A&B, Covid) Anterior Nasal Swab     Status: None   Collection Time: 12/29/22 11:25 PM   Specimen: Anterior Nasal Swab  Result Value Ref Range   SARS Coronavirus 2 by RT PCR NEGATIVE NEGATIVE    Comment: (NOTE) SARS-CoV-2 target nucleic acids are NOT DETECTED.  The SARS-CoV-2 RNA is generally detectable in upper respiratory specimens during the acute phase of infection. The lowest concentration of SARS-CoV-2 viral copies this  assay can detect is 138 copies/mL. A negative result does not preclude SARS-Cov-2 infection and should not be used as the sole basis for treatment or other patient management decisions. A negative result may occur with  improper specimen collection/handling,  submission of specimen other than nasopharyngeal swab, presence of viral mutation(s) within the areas targeted by this assay, and inadequate number of viral copies(<138 copies/mL). A negative result must be combined with clinical observations, patient history, and epidemiological information. The expected result is Negative.  Fact Sheet for Patients:  BloggerCourse.com  Fact Sheet for Healthcare Providers:  SeriousBroker.it  This test is no t yet approved or cleared by the [place] FDA and  has been authorized for detection and/or diagnosis of SARS-CoV-2 by FDA under an Emergency Use Authorization (EUA). This EUA will remain  in effect (meaning this test can be used) for the duration of the COVID-19 declaration under Section 564(b)(1) of the Act, 21 U.S.C.section 360bbb-3(b)(1), unless the authorization is terminated  or revoked sooner.       Influenza A by PCR NEGATIVE NEGATIVE   Influenza B by PCR NEGATIVE NEGATIVE    Comment: (NOTE) The Xpert Xpress SARS-CoV-2/FLU/RSV plus assay is intended as an aid in the diagnosis of influenza from Nasopharyngeal swab specimens and should not be used as a sole basis for treatment. Nasal washings and aspirates are unacceptable for Xpert Xpress SARS-CoV-2/FLU/RSV testing.  Fact Sheet for Patients: BloggerCourse.com  Fact Sheet for Healthcare Providers: SeriousBroker.it  This test is not yet approved or cleared by the [place] FDA and has been authorized for detection and/or diagnosis of SARS-CoV-2 by FDA under an Emergency Use Authorization (EUA). This EUA will remain in  effect (meaning this test can be used) for the duration of the COVID-19 declaration under Section 564(b)(1) of the Act, 21 U.S.C. section 360bbb-3(b)(1), unless the authorization is terminated or revoked.     Resp Syncytial Virus by PCR NEGATIVE NEGATIVE    Comment: (NOTE) Fact Sheet for Patients: BloggerCourse.com  Fact Sheet for Healthcare Providers: SeriousBroker.it  This test is not yet approved or cleared by the [place] FDA and has been authorized for detection and/or diagnosis of SARS-CoV-2 by FDA under an Emergency Use Authorization (EUA). This EUA will remain in effect (meaning this test can be used) for the duration of the COVID-19 declaration under Section 564(b)(1) of the Act, 21 U.S.C. section 360bbb-3(b)(1), unless the authorization is terminated or revoked.  Performed at [facility], [address], [place], [place] [address]    US Pelvis Complete  Result Date: 12/30/2022 CLINICAL DATA:  Lower abdominal pain EXAM: TRANSABDOMINAL AND TRANSVAGINAL ULTRASOUND OF PELVIS DOPPLER ULTRASOUND OF OVARIES TECHNIQUE: Both transabdominal and transvaginal ultrasound examinations of the pelvis were performed. Transabdominal technique was performed for global imaging of the pelvis including uterus, ovaries, adnexal regions, and pelvic cul-de-sac. It was necessary to proceed with endovaginal exam following the transabdominal exam to visualize the endometrium and ovaries bilaterally. Color and duplex Doppler ultrasound was utilized to evaluate blood flow to the ovaries. COMPARISON:  CT 12/29/2022 FINDINGS: Uterus Measurements: 10.2 x 5.6 x 6.3 cm = volume: 186 mL. The uterus is anteverted. The cervix is unremarkable. No fibroids or other mass visualized. Endometrium Thickness: 3 mm.  No focal abnormality visualized. Right ovary Not visualized.  No adnexal mass. Left ovary Measurements: 2.5 x 1.8 x 1.8 cm = volume: 4  mL. Normal appearance/no adnexal mass. Pulsed Doppler evaluation of the visualized left ovary demonstrates normal low-resistance arterial and venous waveforms. Other findings No abnormal free fluid. IMPRESSION: 1. Nonvisualization of the right ovary. Otherwise normal pelvic sonogram. Electronically Signed   By: Helyn Numbers M.D.   On: 12/30/2022 01:13   US Transvaginal Non-OB  Result Date:  12/30/2022 CLINICAL DATA:  Lower abdominal pain EXAM: TRANSABDOMINAL AND TRANSVAGINAL ULTRASOUND OF PELVIS DOPPLER ULTRASOUND OF OVARIES TECHNIQUE: Both transabdominal and transvaginal ultrasound examinations of the pelvis were performed. Transabdominal technique was performed for global imaging of the pelvis including uterus, ovaries, adnexal regions, and pelvic cul-de-sac. It was necessary to proceed with endovaginal exam following the transabdominal exam to visualize the endometrium and ovaries bilaterally. Color and duplex Doppler ultrasound was utilized to evaluate blood flow to the ovaries. COMPARISON:  CT 12/29/2022 FINDINGS: Uterus Measurements: 10.2 x 5.6 x 6.3 cm = volume: 186 mL. The uterus is anteverted. The cervix is unremarkable. No fibroids or other mass visualized. Endometrium Thickness: 3 mm.  No focal abnormality visualized. Right ovary Not visualized.  No adnexal mass. Left ovary Measurements: 2.5 x 1.8 x 1.8 cm = volume: 4 mL. Normal appearance/no adnexal mass. Pulsed Doppler evaluation of the visualized left ovary demonstrates normal low-resistance arterial and venous waveforms. Other findings No abnormal free fluid. IMPRESSION: 1. Nonvisualization of the right ovary. Otherwise normal pelvic sonogram. Electronically Signed   By: Helyn Numbers M.D.   On: 12/30/2022 01:13   [place] Art/Ven Flow Abd Pelv Doppler  Result Date: 12/30/2022 CLINICAL DATA:  Lower abdominal pain EXAM: TRANSABDOMINAL AND TRANSVAGINAL ULTRASOUND OF PELVIS DOPPLER ULTRASOUND OF OVARIES TECHNIQUE: Both transabdominal and  transvaginal ultrasound examinations of the pelvis were performed. Transabdominal technique was performed for global imaging of the pelvis including uterus, ovaries, adnexal regions, and pelvic cul-de-sac. It was necessary to proceed with endovaginal exam following the transabdominal exam to visualize the endometrium and ovaries bilaterally. Color and duplex Doppler ultrasound was utilized to evaluate blood flow to the ovaries. COMPARISON:  CT 12/29/2022 FINDINGS: Uterus Measurements: 10.2 x 5.6 x 6.3 cm = volume: 186 mL. The uterus is anteverted. The cervix is unremarkable. No fibroids or other mass visualized. Endometrium Thickness: 3 mm.  No focal abnormality visualized. Right ovary Not visualized.  No adnexal mass. Left ovary Measurements: 2.5 x 1.8 x 1.8 cm = volume: 4 mL. Normal appearance/no adnexal mass. Pulsed Doppler evaluation of the visualized left ovary demonstrates normal low-resistance arterial and venous waveforms. Other findings No abnormal free fluid. IMPRESSION: 1. Nonvisualization of the right ovary. Otherwise normal pelvic sonogram. Electronically Signed   By: Helyn Numbers M.D.   On: 12/30/2022 01:13   CT ABDOMEN PELVIS W CONTRAST  Result Date: 12/29/2022 CLINICAL DATA:  Nausea vomiting abdominal pain EXAM: CT ABDOMEN AND PELVIS WITH CONTRAST TECHNIQUE: Multidetector CT imaging of the abdomen and pelvis was performed using the standard protocol following bolus administration of intravenous contrast. RADIATION DOSE REDUCTION: This exam was performed according to the departmental dose-optimization program which includes automated exposure control, adjustment of the mA and/or kV according to patient size and/or use of iterative reconstruction technique. CONTRAST:  OMNIPAQUE IOHEXOL 300 MG/ML  SOLN COMPARISON:  None Available. FINDINGS: Lower chest: No acute abnormality. Hepatobiliary: No focal liver abnormality is seen. No gallstones, gallbladder wall thickening, or biliary dilatation.  Pancreas: Unremarkable. No pancreatic ductal dilatation or surrounding inflammatory changes. Spleen: Normal in size without focal abnormality. Adrenals/Urinary Tract: Adrenal glands are unremarkable. Kidneys are normal, without renal calculi, focal lesion, or hydronephrosis. Bladder is unremarkable. Stomach/Bowel: Stomach is within normal limits. Appendix appears normal. No evidence of bowel wall thickening, distention, or inflammatory changes. Vascular/Lymphatic: No significant vascular findings are present. No enlarged abdominal or pelvic lymph nodes. Reproductive: Uterus and bilateral adnexa are unremarkable. Other: No abdominal wall hernia or abnormality. No abdominopelvic ascites. Musculoskeletal: No acute  or significant osseous findings. IMPRESSION: Negative CT of the abdomen and pelvis. Electronically Signed   By: Jasmine Pang M.D.   On: 12/29/2022 22:05    Pending Labs Unresulted Labs (From admission, onward)    None       Vitals/Pain Today's Vitals   12/30/22 0145 12/30/22 0200 12/30/22 0215 12/30/22 0230  BP: (!) 158/101 (!) 165/101 (!) 161/100 (!) 163/95  Pulse: 80 88 84 94  Resp:    18  Temp:    98.1 F (36.7 C)  TempSrc:    Oral  SpO2: 97% 99% 99% 99%  Weight:      Height:      PainSc:        Isolation Precautions No active isolations  Medications Medications  lactated ringers bolus 1,000 mL (has no administration in time range)  ondansetron (ZOFRAN) injection 4 mg (4 mg Intravenous Given 12/29/22 2113)  morphine (PF) 4 MG/ML injection 4 mg (4 mg Intravenous Given 12/29/22 2113)  iohexol (OMNIPAQUE) 300 MG/ML solution 100 mL (100 mLs Intravenous Contrast Given 12/29/22 2130)  ketorolac (TORADOL) 30 MG/ML injection 30 mg (30 mg Intravenous Given 12/29/22 2322)  fentaNYL (SUBLIMAZE) injection 25 mcg (25 mcg Intravenous Given 12/29/22 2322)  lactated ringers bolus 1,000 mL (1,000 mLs Intravenous New Bag/Given 12/30/22 0053)  HYDROmorphone (DILAUDID) injection 0.5 mg  (0.5 mg Intravenous Given 12/30/22 0133)    Mobility walks     Focused Assessments     R Recommendations: See Admitting Provider Note  Report given to:   Additional Notes:

## 2022-12-30 NOTE — Progress Notes (Addendum)
PROGRESS NOTE    Katrina Stewart  QIO:[id number] DOB: 19-Mar-1995 DOA: 12/29/2022 PCP: Georganna Skeans, MD   Brief Narrative: 27 year old with past medical history significant for depression, anxiety, BMI 41, alcohol abuse presented with abdominal pain, back pain nausea and vomiting found to have acute pancreatitis.  Evaluation in the ED CT abdomen and pelvis negative for acute finding, right ovary was not visualized on pelvic ultrasound which otherwise was normal.  Lipase 202.    Assessment & Plan:   Principal Problem:   Acute pancreatitis Active Problems:   Alcohol abuse   Anxiety and depression   1-Acute pancreatitis: -Presents with abdominal pain, nausea vomiting history of alcohol use.  Lipase 200. -CT abdomen and pelvis no acute finding. -IV fluids, IV protonix.  -pain management IV dilaudid, PRN Oxycodone.  Support care.   Alcohol abuse: -Continue CIWA -Continue folic acid and thiamine No evidence of withdrawal, last drink more than a week ago.    Depression, anxiety: -Continue with Prozac and Seroquel  HTN: PRN Hydralazine  Might need prescription medication at discharge.   Hypokalemia; replete IV>  Hyponatremia; Continue with IV fluids.   Bipolar; continue with Seroquel and Prozac.   Estimated body mass index is 40.61 kg/m as calculated from the following:   Height as of this encounter: 5\' 9"  (1.753 m).   Weight as of this encounter: 124.7 kg.   DVT prophylaxis: Lovenox Code Status: Full code Family Communication:Mother at bedside Disposition Plan:  Status is: Observation The patient remains OBS appropriate and will d/c before 2 midnights.    Consultants:  None  Procedures:  None Antimicrobials:    Subjective: She is still having abdominal pain, 8/10. No further vomiting.  Report itching. No BM . Report headaches  Objective: Vitals:   12/30/22 0200 12/30/22 0215 12/30/22 0230 12/30/22 0318  BP: (!) 165/101 (!) 161/100 (!) 163/95 (!)  148/92  Pulse: 88 84 94 81  Resp:   18 17  Temp:   98.1 F (36.7 C) 98.3 F (36.8 C)  TempSrc:   Oral   SpO2: 99% 99% 99% 100%  Weight:      Height:       No intake or output data in the 24 hours ending 12/30/22 0716 Filed Weights   12/29/22 1957  Weight: 124.7 kg    Examination:  General exam: Appears calm and comfortable  Respiratory system: Clear to auscultation. Respiratory effort normal. Cardiovascular system: S1 & S2 heard, RRR. No JVD, murmurs, rubs, gallops or clicks. No pedal edema. Gastrointestinal system: BS present, soft, tender.  Central nervous system: Alert and oriented. No focal neurological deficits. Extremities: Symmetric 5 x 5 power. Skin: No rashes, lesions or ulcers Psychiatry: Judgement and insight appear normal. Mood & affect appropriate.     Data Reviewed: I have personally reviewed following labs and imaging studies  CBC: Recent Labs  Lab 12/29/22 2022 12/30/22 0622  WBC 7.0 6.0  HGB 10.7* 9.8*  HCT 35.3* 33.2*  MCV 68.5* 70.0*  PLT 371 350   Basic Metabolic Panel: Recent Labs  Lab 12/29/22 2022  NA 137  K 3.7  CL 106  CO2 22  GLUCOSE 98  BUN 12  CREATININE 0.83  CALCIUM 9.3   GFR: Estimated Creatinine Clearance: 141.5 mL/min (by C-G formula based on SCr of 0.83 mg/dL). Liver Function Tests: Recent Labs  Lab 12/29/22 2022  AST 17  ALT 13  ALKPHOS 66  BILITOT 0.6  PROT 8.1  ALBUMIN 4.2   Recent Labs  Lab  12/29/22 2022  LIPASE 202*   No results for input(s): "AMMONIA" in the last 168 hours. Coagulation Profile: No results for input(s): "INR", "PROTIME" in the last 168 hours. Cardiac Enzymes: No results for input(s): "CKTOTAL", "CKMB", "CKMBINDEX", "TROPONINI" in the last 168 hours. BNP (last 3 results) No results for input(s): "PROBNP" in the last 8760 hours. HbA1C: No results for input(s): "HGBA1C" in the last 72 hours. CBG: No results for input(s): "GLUCAP" in the last 168 hours. Lipid Profile: Recent Labs     12/30/22 0622  TRIG 49   Thyroid Function Tests: No results for input(s): "TSH", "T4TOTAL", "FREET4", "T3FREE", "THYROIDAB" in the last 72 hours. Anemia Panel: No results for input(s): "VITAMINB12", "FOLATE", "FERRITIN", "TIBC", "IRON", "RETICCTPCT" in the last 72 hours. Sepsis Labs: No results for input(s): "PROCALCITON", "LATICACIDVEN" in the last 168 hours.  Recent Results (from the past 240 hour(s))  Resp panel by RT-PCR (RSV, Flu A&B, Covid) Anterior Nasal Swab     Status: None   Collection Time: 12/29/22 11:25 PM   Specimen: Anterior Nasal Swab  Result Value Ref Range Status   SARS Coronavirus 2 by RT PCR NEGATIVE NEGATIVE Final    Comment: (NOTE) SARS-CoV-2 target nucleic acids are NOT DETECTED.  The SARS-CoV-2 RNA is generally detectable in upper respiratory specimens during the acute phase of infection. The lowest concentration of SARS-CoV-2 viral copies this assay can detect is 138 copies/mL. A negative result does not preclude SARS-Cov-2 infection and should not be used as the sole basis for treatment or other patient management decisions. A negative result may occur with  improper specimen collection/handling, submission of specimen other than nasopharyngeal swab, presence of viral mutation(s) within the areas targeted by this assay, and inadequate number of viral copies(<138 copies/mL). A negative result must be combined with clinical observations, patient history, and epidemiological information. The expected result is Negative.  Fact Sheet for Patients:  BloggerCourse.com  Fact Sheet for Healthcare Providers:  SeriousBroker.it  This test is no t yet approved or cleared by the [place] FDA and  has been authorized for detection and/or diagnosis of SARS-CoV-2 by FDA under an Emergency Use Authorization (EUA). This EUA will remain  in effect (meaning this test can be used) for the duration of the COVID-19  declaration under Section 564(b)(1) of the Act, 21 U.S.C.section 360bbb-3(b)(1), unless the authorization is terminated  or revoked sooner.       Influenza A by PCR NEGATIVE NEGATIVE Final   Influenza B by PCR NEGATIVE NEGATIVE Final    Comment: (NOTE) The Xpert Xpress SARS-CoV-2/FLU/RSV plus assay is intended as an aid in the diagnosis of influenza from Nasopharyngeal swab specimens and should not be used as a sole basis for treatment. Nasal washings and aspirates are unacceptable for Xpert Xpress SARS-CoV-2/FLU/RSV testing.  Fact Sheet for Patients: BloggerCourse.com  Fact Sheet for Healthcare Providers: SeriousBroker.it  This test is not yet approved or cleared by the [place] FDA and has been authorized for detection and/or diagnosis of SARS-CoV-2 by FDA under an Emergency Use Authorization (EUA). This EUA will remain in effect (meaning this test can be used) for the duration of the COVID-19 declaration under Section 564(b)(1) of the Act, 21 U.S.C. section 360bbb-3(b)(1), unless the authorization is terminated or revoked.     Resp Syncytial Virus by PCR NEGATIVE NEGATIVE Final    Comment: (NOTE) Fact Sheet for Patients: BloggerCourse.com  Fact Sheet for Healthcare Providers: SeriousBroker.it  This test is not yet approved or cleared by the [place]  FDA and has been authorized for detection and/or diagnosis of SARS-CoV-2 by FDA under an Emergency Use Authorization (EUA). This EUA will remain in effect (meaning this test can be used) for the duration of the COVID-19 declaration under Section 564(b)(1) of the Act, 21 U.S.C. section 360bbb-3(b)(1), unless the authorization is terminated or revoked.  Performed at [facility], [address], [place], [place] [address]          Radiology Studies: US Pelvis Complete  Result Date:  12/30/2022 CLINICAL DATA:  Lower abdominal pain EXAM: TRANSABDOMINAL AND TRANSVAGINAL ULTRASOUND OF PELVIS DOPPLER ULTRASOUND OF OVARIES TECHNIQUE: Both transabdominal and transvaginal ultrasound examinations of the pelvis were performed. Transabdominal technique was performed for global imaging of the pelvis including uterus, ovaries, adnexal regions, and pelvic cul-de-sac. It was necessary to proceed with endovaginal exam following the transabdominal exam to visualize the endometrium and ovaries bilaterally. Color and duplex Doppler ultrasound was utilized to evaluate blood flow to the ovaries. COMPARISON:  CT 12/29/2022 FINDINGS: Uterus Measurements: 10.2 x 5.6 x 6.3 cm = volume: 186 mL. The uterus is anteverted. The cervix is unremarkable. No fibroids or other mass visualized. Endometrium Thickness: 3 mm.  No focal abnormality visualized. Right ovary Not visualized.  No adnexal mass. Left ovary Measurements: 2.5 x 1.8 x 1.8 cm = volume: 4 mL. Normal appearance/no adnexal mass. Pulsed Doppler evaluation of the visualized left ovary demonstrates normal low-resistance arterial and venous waveforms. Other findings No abnormal free fluid. IMPRESSION: 1. Nonvisualization of the right ovary. Otherwise normal pelvic sonogram. Electronically Signed   By: Helyn Numbers M.D.   On: 12/30/2022 01:13   US Transvaginal Non-OB  Result Date: 12/30/2022 CLINICAL DATA:  Lower abdominal pain EXAM: TRANSABDOMINAL AND TRANSVAGINAL ULTRASOUND OF PELVIS DOPPLER ULTRASOUND OF OVARIES TECHNIQUE: Both transabdominal and transvaginal ultrasound examinations of the pelvis were performed. Transabdominal technique was performed for global imaging of the pelvis including uterus, ovaries, adnexal regions, and pelvic cul-de-sac. It was necessary to proceed with endovaginal exam following the transabdominal exam to visualize the endometrium and ovaries bilaterally. Color and duplex Doppler ultrasound was utilized to evaluate blood flow to  the ovaries. COMPARISON:  CT 12/29/2022 FINDINGS: Uterus Measurements: 10.2 x 5.6 x 6.3 cm = volume: 186 mL. The uterus is anteverted. The cervix is unremarkable. No fibroids or other mass visualized. Endometrium Thickness: 3 mm.  No focal abnormality visualized. Right ovary Not visualized.  No adnexal mass. Left ovary Measurements: 2.5 x 1.8 x 1.8 cm = volume: 4 mL. Normal appearance/no adnexal mass. Pulsed Doppler evaluation of the visualized left ovary demonstrates normal low-resistance arterial and venous waveforms. Other findings No abnormal free fluid. IMPRESSION: 1. Nonvisualization of the right ovary. Otherwise normal pelvic sonogram. Electronically Signed   By: Helyn Numbers M.D.   On: 12/30/2022 01:13   [place] Art/Ven Flow Abd Pelv Doppler  Result Date: 12/30/2022 CLINICAL DATA:  Lower abdominal pain EXAM: TRANSABDOMINAL AND TRANSVAGINAL ULTRASOUND OF PELVIS DOPPLER ULTRASOUND OF OVARIES TECHNIQUE: Both transabdominal and transvaginal ultrasound examinations of the pelvis were performed. Transabdominal technique was performed for global imaging of the pelvis including uterus, ovaries, adnexal regions, and pelvic cul-de-sac. It was necessary to proceed with endovaginal exam following the transabdominal exam to visualize the endometrium and ovaries bilaterally. Color and duplex Doppler ultrasound was utilized to evaluate blood flow to the ovaries. COMPARISON:  CT 12/29/2022 FINDINGS: Uterus Measurements: 10.2 x 5.6 x 6.3 cm = volume: 186 mL. The uterus is anteverted. The cervix is unremarkable. No fibroids or  other mass visualized. Endometrium Thickness: 3 mm.  No focal abnormality visualized. Right ovary Not visualized.  No adnexal mass. Left ovary Measurements: 2.5 x 1.8 x 1.8 cm = volume: 4 mL. Normal appearance/no adnexal mass. Pulsed Doppler evaluation of the visualized left ovary demonstrates normal low-resistance arterial and venous waveforms. Other findings No abnormal free fluid. IMPRESSION: 1.  Nonvisualization of the right ovary. Otherwise normal pelvic sonogram. Electronically Signed   By: Helyn Numbers M.D.   On: 12/30/2022 01:13   CT ABDOMEN PELVIS W CONTRAST  Result Date: 12/29/2022 CLINICAL DATA:  Nausea vomiting abdominal pain EXAM: CT ABDOMEN AND PELVIS WITH CONTRAST TECHNIQUE: Multidetector CT imaging of the abdomen and pelvis was performed using the standard protocol following bolus administration of intravenous contrast. RADIATION DOSE REDUCTION: This exam was performed according to the departmental dose-optimization program which includes automated exposure control, adjustment of the mA and/or kV according to patient size and/or use of iterative reconstruction technique. CONTRAST:  OMNIPAQUE IOHEXOL 300 MG/ML  SOLN COMPARISON:  None Available. FINDINGS: Lower chest: No acute abnormality. Hepatobiliary: No focal liver abnormality is seen. No gallstones, gallbladder wall thickening, or biliary dilatation. Pancreas: Unremarkable. No pancreatic ductal dilatation or surrounding inflammatory changes. Spleen: Normal in size without focal abnormality. Adrenals/Urinary Tract: Adrenal glands are unremarkable. Kidneys are normal, without renal calculi, focal lesion, or hydronephrosis. Bladder is unremarkable. Stomach/Bowel: Stomach is within normal limits. Appendix appears normal. No evidence of bowel wall thickening, distention, or inflammatory changes. Vascular/Lymphatic: No significant vascular findings are present. No enlarged abdominal or pelvic lymph nodes. Reproductive: Uterus and bilateral adnexa are unremarkable. Other: No abdominal wall hernia or abnormality. No abdominopelvic ascites. Musculoskeletal: No acute or significant osseous findings. IMPRESSION: Negative CT of the abdomen and pelvis. Electronically Signed   By: Jasmine Pang M.D.   On: 12/29/2022 22:05        Scheduled Meds:  enoxaparin (LOVENOX) injection  40 mg Subcutaneous Q24H   FLUoxetine  60 mg Oral Daily    folic acid  1 mg Oral Daily   LORazepam  0-4 mg Intravenous Q6H   Followed by   Melene Muller ON 01/01/2023] LORazepam  0-4 mg Intravenous Q12H   multivitamin with minerals  1 tablet Oral Daily   QUEtiapine  300 mg Oral QHS   thiamine  100 mg Oral Daily   Or   thiamine  100 mg Intravenous Daily   Continuous Infusions:  lactated ringers     lactated ringers 125 mL/hr at 12/30/22 0525     LOS: 0 days    Time spent: 35 minutes    Yatzil Clippinger A Erastus Bartolomei, MD [facility]   If 7PM-7AM, please contact night-coverage [email or web address]  12/30/2022, 7:16 AM

## 2022-12-31 DIAGNOSIS — K852 Alcohol induced acute pancreatitis without necrosis or infection: Secondary | ICD-10-CM | POA: Diagnosis not present

## 2022-12-31 LAB — CBC
HCT: 35 % — ABNORMAL LOW (ref 36.0–46.0)
Hemoglobin: 10.4 g/dL — ABNORMAL LOW (ref 12.0–15.0)
MCH: 20.8 pg — ABNORMAL LOW (ref 26.0–34.0)
MCHC: 29.7 g/dL — ABNORMAL LOW (ref 30.0–36.0)
MCV: 69.9 fL — ABNORMAL LOW (ref 80.0–100.0)
Platelets: 313 10*3/uL (ref 150–400)
RBC: 5.01 MIL/uL (ref 3.87–5.11)
RDW: 18.2 % — ABNORMAL HIGH (ref 11.5–15.5)
WBC: 5.5 10*3/uL (ref 4.0–10.5)
nRBC: 0 % (ref 0.0–0.2)

## 2022-12-31 LAB — GLUCOSE, CAPILLARY
Glucose-Capillary: 92 mg/dL (ref 70–99)
Glucose-Capillary: 79 mg/dL (ref 70–99)
Glucose-Capillary: 71 mg/dL (ref 70–99)
Glucose-Capillary: 80 mg/dL (ref 70–99)
Glucose-Capillary: 69 mg/dL — ABNORMAL LOW (ref 70–99)
Glucose-Capillary: 101 mg/dL — ABNORMAL HIGH (ref 70–99)

## 2022-12-31 LAB — BASIC METABOLIC PANEL
Anion gap: 8 (ref 5–15)
BUN: 8 mg/dL (ref 6–20)
CO2: 25 mmol/L (ref 22–32)
Calcium: 9 mg/dL (ref 8.9–10.3)
Chloride: 101 mmol/L (ref 98–111)
Creatinine, Ser: 0.84 mg/dL (ref 0.44–1.00)
GFR, Estimated: >60 mL/min (ref >60)
Glucose, Bld: 78 mg/dL (ref 70–99)
Potassium: 3.2 mmol/L — ABNORMAL LOW (ref 3.5–5.1)
Sodium: 134 mmol/L — ABNORMAL LOW (ref 135–145)

## 2022-12-31 MED ORDER — POTASSIUM CHLORIDE 10 MEQ/100ML IV SOLN
10.0000 meq | INTRAVENOUS | Status: AC
  Administered 2022-12-31 (×4): 10 meq via INTRAVENOUS
  Filled 2022-12-31 (×4): qty 100

## 2022-12-31 MED ORDER — SENNOSIDES-DOCUSATE SODIUM 8.6-50 MG PO TABS
1.0000 | ORAL_TABLET | Freq: Two times a day (BID) | ORAL | Status: DC
  Administered 2022-12-31 – 2023-01-02 (×4): 1 via ORAL
  Filled 2022-12-31 (×4): qty 1

## 2022-12-31 MED ORDER — LACTATED RINGERS IV SOLN
INTRAVENOUS | Status: AC
Start: 2022-12-31 — End: 2023-01-01

## 2022-12-31 NOTE — Progress Notes (Signed)
PROGRESS NOTE    Katrina Stewart  FAO:[id number] DOB: 15-May-1995 DOA: 12/29/2022 PCP: Georganna Skeans, MD   Brief Narrative: 27 year old with past medical history significant for depression, anxiety, BMI 41, alcohol abuse presented with abdominal pain, back pain nausea and vomiting found to have acute pancreatitis.  Evaluation in the ED CT abdomen and pelvis negative for acute finding, right ovary was not visualized on pelvic ultrasound which otherwise was normal.  Lipase 202.    Assessment & Plan:   Principal Problem:   Acute pancreatitis Active Problems:   Alcohol abuse   Anxiety and depression   1-Acute pancreatitis: -Presents with abdominal pain, nausea vomiting history of alcohol use.  Lipase 200. -CT abdomen and pelvis no acute finding. -IV fluids, IV protonix.  -pain management IV dilaudid, PRN Oxycodone.  Support care.  Continue to report 7/10 abdominal pain, no further vomiting.  Plan to continue with current management  Alcohol abuse: -Continue CIWA -Continue folic acid and thiamine No evidence of withdrawal, last drink more than a week ago.    Depression, anxiety: -Continue with Prozac and Seroquel  HTN: PRN Hydralazine  Might need prescription medication at discharge.   Hypokalemia; replete IV>  Hyponatremia; Continue with IV fluids.   Bipolar; continue with Seroquel and Prozac.   Estimated body mass index is 40.61 kg/m as calculated from the following:   Height as of this encounter: 5\' 9"  (1.753 m).   Weight as of this encounter: 124.7 kg.   DVT prophylaxis: Lovenox Code Status: Full code Family Communication:Mother at bedside Disposition Plan:  Status is: Observation The patient remains OBS appropriate and will d/c before 2 midnights.    Consultants:  None  Procedures:  None Antimicrobials:    Subjective: Abdominal pain 7/10. No BM yet   Objective: Vitals:   12/30/22 1018 12/30/22 1606 12/30/22 2253 12/31/22 0509  BP: (!)  135/96 (!) 141/95 137/78 130/70  Pulse: 76 78 98 77  Resp: 17 16 18 18   Temp: 98 F (36.7 C) 97.9 F (36.6 C) 98 F (36.7 C) 98.1 F (36.7 C)  TempSrc: Oral Oral Oral Oral  SpO2: 99% 100% 97% 98%  Weight:      Height:        Intake/Output Summary (Last 24 hours) at 12/31/2022 0746 Last data filed at 12/31/2022 0600 Gross per 24 hour  Intake 2975.05 ml  Output --  Net 2975.05 ml   Filed Weights   12/29/22 1957  Weight: 124.7 kg    Examination:  General exam: NAD Respiratory system: CTA Cardiovascular system: S 1, S 2 RRR Gastrointestinal system: BS present, soft, nt Central nervous system: non focal.  Extremities: no edema   Data Reviewed: I have personally reviewed following labs and imaging studies  CBC: Recent Labs  Lab 12/29/22 2022 12/30/22 0622 12/31/22 0344  WBC 7.0 6.0 5.5  HGB 10.7* 9.8* 10.4*  HCT 35.3* 33.2* 35.0*  MCV 68.5* 70.0* 69.9*  PLT 371 350 313   Basic Metabolic Panel: Recent Labs  Lab 12/29/22 2022 12/30/22 0622 12/31/22 0344  NA 137 134* 134*  K 3.7 3.2* 3.2*  CL 106 102 101  CO2 22 24 25   GLUCOSE 98 84 78  BUN 12 9 8   CREATININE 0.83 0.78 0.84  CALCIUM 9.3 8.6* 9.0   GFR: Estimated Creatinine Clearance: 139.8 mL/min (by C-G formula based on SCr of 0.84 mg/dL). Liver Function Tests: Recent Labs  Lab 12/29/22 2022 12/30/22 0622  AST 17 14*  ALT 13 13  ALKPHOS 66  58  BILITOT 0.6 0.5  PROT 8.1 6.9  ALBUMIN 4.2 3.7   Recent Labs  Lab 12/29/22 2022  LIPASE 202*   No results for input(s): "AMMONIA" in the last 168 hours. Coagulation Profile: No results for input(s): "INR", "PROTIME" in the last 168 hours. Cardiac Enzymes: No results for input(s): "CKTOTAL", "CKMB", "CKMBINDEX", "TROPONINI" in the last 168 hours. BNP (last 3 results) No results for input(s): "PROBNP" in the last 8760 hours. HbA1C: No results for input(s): "HGBA1C" in the last 72 hours. CBG: Recent Labs  Lab 12/30/22 1750 12/30/22 2254  12/31/22 0506 12/31/22 0724  GLUCAP 76 92 92 79   Lipid Profile: Recent Labs    12/30/22 0622  TRIG 49   Thyroid Function Tests: No results for input(s): "TSH", "T4TOTAL", "FREET4", "T3FREE", "THYROIDAB" in the last 72 hours. Anemia Panel: No results for input(s): "VITAMINB12", "FOLATE", "FERRITIN", "TIBC", "IRON", "RETICCTPCT" in the last 72 hours. Sepsis Labs: No results for input(s): "PROCALCITON", "LATICACIDVEN" in the last 168 hours.  Recent Results (from the past 240 hour(s))  Resp panel by RT-PCR (RSV, Flu A&B, Covid) Anterior Nasal Swab     Status: None   Collection Time: 12/29/22 11:25 PM   Specimen: Anterior Nasal Swab  Result Value Ref Range Status   SARS Coronavirus 2 by RT PCR NEGATIVE NEGATIVE Final    Comment: (NOTE) SARS-CoV-2 target nucleic acids are NOT DETECTED.  The SARS-CoV-2 RNA is generally detectable in upper respiratory specimens during the acute phase of infection. The lowest concentration of SARS-CoV-2 viral copies this assay can detect is 138 copies/mL. A negative result does not preclude SARS-Cov-2 infection and should not be used as the sole basis for treatment or other patient management decisions. A negative result may occur with  improper specimen collection/handling, submission of specimen other than nasopharyngeal swab, presence of viral mutation(s) within the areas targeted by this assay, and inadequate number of viral copies(<138 copies/mL). A negative result must be combined with clinical observations, patient history, and epidemiological information. The expected result is Negative.  Fact Sheet for Patients:  BloggerCourse.com  Fact Sheet for Healthcare Providers:  SeriousBroker.it  This test is no t yet approved or cleared by the [place] FDA and  has been authorized for detection and/or diagnosis of SARS-CoV-2 by FDA under an Emergency Use Authorization (EUA). This EUA will  remain  in effect (meaning this test can be used) for the duration of the COVID-19 declaration under Section 564(b)(1) of the Act, 21 U.S.C.section 360bbb-3(b)(1), unless the authorization is terminated  or revoked sooner.       Influenza A by PCR NEGATIVE NEGATIVE Final   Influenza B by PCR NEGATIVE NEGATIVE Final    Comment: (NOTE) The Xpert Xpress SARS-CoV-2/FLU/RSV plus assay is intended as an aid in the diagnosis of influenza from Nasopharyngeal swab specimens and should not be used as a sole basis for treatment. Nasal washings and aspirates are unacceptable for Xpert Xpress SARS-CoV-2/FLU/RSV testing.  Fact Sheet for Patients: BloggerCourse.com  Fact Sheet for Healthcare Providers: SeriousBroker.it  This test is not yet approved or cleared by the [place] FDA and has been authorized for detection and/or diagnosis of SARS-CoV-2 by FDA under an Emergency Use Authorization (EUA). This EUA will remain in effect (meaning this test can be used) for the duration of the COVID-19 declaration under Section 564(b)(1) of the Act, 21 U.S.C. section 360bbb-3(b)(1), unless the authorization is terminated or revoked.     Resp Syncytial Virus by PCR NEGATIVE NEGATIVE Final  Comment: (NOTE) Fact Sheet for Patients: BloggerCourse.com  Fact Sheet for Healthcare Providers: SeriousBroker.it  This test is not yet approved or cleared by the [place] FDA and has been authorized for detection and/or diagnosis of SARS-CoV-2 by FDA under an Emergency Use Authorization (EUA). This EUA will remain in effect (meaning this test can be used) for the duration of the COVID-19 declaration under Section 564(b)(1) of the Act, 21 U.S.C. section 360bbb-3(b)(1), unless the authorization is terminated or revoked.  Performed at [facility], [address] [place], [place]  [address]          Radiology Studies: US Pelvis Complete  Result Date: 12/30/2022 CLINICAL DATA:  Lower abdominal pain EXAM: TRANSABDOMINAL AND TRANSVAGINAL ULTRASOUND OF PELVIS DOPPLER ULTRASOUND OF OVARIES TECHNIQUE: Both transabdominal and transvaginal ultrasound examinations of the pelvis were performed. Transabdominal technique was performed for global imaging of the pelvis including uterus, ovaries, adnexal regions, and pelvic cul-de-sac. It was necessary to proceed with endovaginal exam following the transabdominal exam to visualize the endometrium and ovaries bilaterally. Color and duplex Doppler ultrasound was utilized to evaluate blood flow to the ovaries. COMPARISON:  CT 12/29/2022 FINDINGS: Uterus Measurements: 10.2 x 5.6 x 6.3 cm = volume: 186 mL. The uterus is anteverted. The cervix is unremarkable. No fibroids or other mass visualized. Endometrium Thickness: 3 mm.  No focal abnormality visualized. Right ovary Not visualized.  No adnexal mass. Left ovary Measurements: 2.5 x 1.8 x 1.8 cm = volume: 4 mL. Normal appearance/no adnexal mass. Pulsed Doppler evaluation of the visualized left ovary demonstrates normal low-resistance arterial and venous waveforms. Other findings No abnormal free fluid. IMPRESSION: 1. Nonvisualization of the right ovary. Otherwise normal pelvic sonogram. Electronically Signed   By: Helyn Numbers M.D.   On: 12/30/2022 01:13   US Transvaginal Non-OB  Result Date: 12/30/2022 CLINICAL DATA:  Lower abdominal pain EXAM: TRANSABDOMINAL AND TRANSVAGINAL ULTRASOUND OF PELVIS DOPPLER ULTRASOUND OF OVARIES TECHNIQUE: Both transabdominal and transvaginal ultrasound examinations of the pelvis were performed. Transabdominal technique was performed for global imaging of the pelvis including uterus, ovaries, adnexal regions, and pelvic cul-de-sac. It was necessary to proceed with endovaginal exam following the transabdominal exam to visualize the endometrium and ovaries  bilaterally. Color and duplex Doppler ultrasound was utilized to evaluate blood flow to the ovaries. COMPARISON:  CT 12/29/2022 FINDINGS: Uterus Measurements: 10.2 x 5.6 x 6.3 cm = volume: 186 mL. The uterus is anteverted. The cervix is unremarkable. No fibroids or other mass visualized. Endometrium Thickness: 3 mm.  No focal abnormality visualized. Right ovary Not visualized.  No adnexal mass. Left ovary Measurements: 2.5 x 1.8 x 1.8 cm = volume: 4 mL. Normal appearance/no adnexal mass. Pulsed Doppler evaluation of the visualized left ovary demonstrates normal low-resistance arterial and venous waveforms. Other findings No abnormal free fluid. IMPRESSION: 1. Nonvisualization of the right ovary. Otherwise normal pelvic sonogram. Electronically Signed   By: Helyn Numbers M.D.   On: 12/30/2022 01:13   [place] Art/Ven Flow Abd Pelv Doppler  Result Date: 12/30/2022 CLINICAL DATA:  Lower abdominal pain EXAM: TRANSABDOMINAL AND TRANSVAGINAL ULTRASOUND OF PELVIS DOPPLER ULTRASOUND OF OVARIES TECHNIQUE: Both transabdominal and transvaginal ultrasound examinations of the pelvis were performed. Transabdominal technique was performed for global imaging of the pelvis including uterus, ovaries, adnexal regions, and pelvic cul-de-sac. It was necessary to proceed with endovaginal exam following the transabdominal exam to visualize the endometrium and ovaries bilaterally. Color and duplex Doppler ultrasound was utilized to evaluate blood flow to the ovaries. COMPARISON:  CT 12/29/2022 FINDINGS: Uterus Measurements: 10.2 x 5.6 x 6.3 cm = volume: 186 mL. The uterus is anteverted. The cervix is unremarkable. No fibroids or other mass visualized. Endometrium Thickness: 3 mm.  No focal abnormality visualized. Right ovary Not visualized.  No adnexal mass. Left ovary Measurements: 2.5 x 1.8 x 1.8 cm = volume: 4 mL. Normal appearance/no adnexal mass. Pulsed Doppler evaluation of the visualized left ovary demonstrates normal low-resistance  arterial and venous waveforms. Other findings No abnormal free fluid. IMPRESSION: 1. Nonvisualization of the right ovary. Otherwise normal pelvic sonogram. Electronically Signed   By: Helyn Numbers M.D.   On: 12/30/2022 01:13   CT ABDOMEN PELVIS W CONTRAST  Result Date: 12/29/2022 CLINICAL DATA:  Nausea vomiting abdominal pain EXAM: CT ABDOMEN AND PELVIS WITH CONTRAST TECHNIQUE: Multidetector CT imaging of the abdomen and pelvis was performed using the standard protocol following bolus administration of intravenous contrast. RADIATION DOSE REDUCTION: This exam was performed according to the departmental dose-optimization program which includes automated exposure control, adjustment of the mA and/or kV according to patient size and/or use of iterative reconstruction technique. CONTRAST:  OMNIPAQUE IOHEXOL 300 MG/ML  SOLN COMPARISON:  None Available. FINDINGS: Lower chest: No acute abnormality. Hepatobiliary: No focal liver abnormality is seen. No gallstones, gallbladder wall thickening, or biliary dilatation. Pancreas: Unremarkable. No pancreatic ductal dilatation or surrounding inflammatory changes. Spleen: Normal in size without focal abnormality. Adrenals/Urinary Tract: Adrenal glands are unremarkable. Kidneys are normal, without renal calculi, focal lesion, or hydronephrosis. Bladder is unremarkable. Stomach/Bowel: Stomach is within normal limits. Appendix appears normal. No evidence of bowel wall thickening, distention, or inflammatory changes. Vascular/Lymphatic: No significant vascular findings are present. No enlarged abdominal or pelvic lymph nodes. Reproductive: Uterus and bilateral adnexa are unremarkable. Other: No abdominal wall hernia or abnormality. No abdominopelvic ascites. Musculoskeletal: No acute or significant osseous findings. IMPRESSION: Negative CT of the abdomen and pelvis. Electronically Signed   By: Jasmine Pang M.D.   On: 12/29/2022 22:05        Scheduled Meds:   enoxaparin (LOVENOX) injection  40 mg Subcutaneous Q24H   FLUoxetine  60 mg Oral Daily   folic acid  1 mg Oral Daily   LORazepam  0-4 mg Intravenous Q6H   Followed by   Melene Muller ON 01/01/2023] LORazepam  0-4 mg Intravenous Q12H   multivitamin with minerals  1 tablet Oral Daily   pantoprazole (PROTONIX) IV  40 mg Intravenous Q12H   QUEtiapine  300 mg Oral QHS   thiamine  100 mg Oral Daily   Or   thiamine  100 mg Intravenous Daily   Continuous Infusions:  lactated ringers     potassium chloride       LOS: 1 day    Time spent: 35 minutes    Molly Maselli A Camie Hauss, MD [facility]   If 7PM-7AM, please contact night-coverage [email or web address]  12/31/2022, 7:46 AM

## 2022-12-31 NOTE — Progress Notes (Signed)
   12/31/22 1420  TOC Brief Assessment  Insurance and Status Reviewed  Patient has primary care physician Yes  Home environment has been reviewed home with mother and pt's children  Prior level of function: independent  Prior/Current Home Services No current home services  Social Determinants of Health Reivew SDOH reviewed no interventions necessary  Readmission risk has been reviewed Yes  Transition of care needs transition of care needs identified, TOC will continue to follow   Met with pt to review [place] resources.  Pt receptive and would like local resource info placed on AVS - DONE.

## 2022-12-31 NOTE — Plan of Care (Signed)
?  Problem: Education: ?Goal: Knowledge of General Education information will improve ?Description: Including pain rating scale, medication(s)/side effects and non-pharmacologic comfort measures ?Outcome: Progressing ?  ?Problem: Health Behavior/Discharge Planning: ?Goal: Ability to manage health-related needs will improve ?Outcome: Progressing ?  ?Problem: Clinical Measurements: ?Goal: Ability to maintain clinical measurements within normal limits will improve ?Outcome: Progressing ?  ?Problem: Activity: ?Goal: Risk for activity intolerance will decrease ?Outcome: Progressing ?  ?Problem: Nutrition: ?Goal: Adequate nutrition will be maintained ?Outcome: Progressing ?  ?Problem: Elimination: ?Goal: Will not experience complications related to bowel motility ?Outcome: Progressing ?  ?Problem: Safety: ?Goal: Ability to remain free from injury will improve ?Outcome: Progressing ?  ?Problem: Skin Integrity: ?Goal: Risk for impaired skin integrity will decrease ?Outcome: Progressing ?  ?

## 2022-12-31 NOTE — Plan of Care (Signed)
  Problem: Education: Goal: Knowledge of General Education information will improve Description: Including pain rating scale, medication(s)/side effects and non-pharmacologic comfort measures Outcome: Progressing   Problem: Clinical Measurements: Goal: Ability to maintain clinical measurements within normal limits will improve Outcome: Progressing   Problem: Nutrition: Goal: Adequate nutrition will be maintained Outcome: Progressing   Problem: Coping: Goal: Level of anxiety will decrease Outcome: Progressing   Problem: Elimination: Goal: Will not experience complications related to urinary retention Outcome: Adequate for Discharge   Problem: Pain Management: Goal: General experience of comfort will improve Outcome: Progressing   Problem: Safety: Goal: Ability to remain free from injury will improve Outcome: Progressing

## 2023-01-01 DIAGNOSIS — K852 Alcohol induced acute pancreatitis without necrosis or infection: Secondary | ICD-10-CM | POA: Diagnosis not present

## 2023-01-01 LAB — CBC
HCT: 31.2 % — ABNORMAL LOW (ref 36.0–46.0)
Hemoglobin: 9.2 g/dL — ABNORMAL LOW (ref 12.0–15.0)
MCH: 20.8 pg — ABNORMAL LOW (ref 26.0–34.0)
MCHC: 29.5 g/dL — ABNORMAL LOW (ref 30.0–36.0)
MCV: 70.4 fL — ABNORMAL LOW (ref 80.0–100.0)
Platelets: 324 10*3/uL (ref 150–400)
RBC: 4.43 MIL/uL (ref 3.87–5.11)
RDW: 18 % — ABNORMAL HIGH (ref 11.5–15.5)
WBC: 5.2 10*3/uL (ref 4.0–10.5)
nRBC: 0 % (ref 0.0–0.2)

## 2023-01-01 LAB — BASIC METABOLIC PANEL
Anion gap: 8 (ref 5–15)
BUN: 10 mg/dL (ref 6–20)
CO2: 26 mmol/L (ref 22–32)
Calcium: 8.9 mg/dL (ref 8.9–10.3)
Chloride: 105 mmol/L (ref 98–111)
Creatinine, Ser: 0.96 mg/dL (ref 0.44–1.00)
GFR, Estimated: >60 mL/min (ref >60)
Glucose, Bld: 84 mg/dL (ref 70–99)
Potassium: 3.4 mmol/L — ABNORMAL LOW (ref 3.5–5.1)
Sodium: 139 mmol/L (ref 135–145)

## 2023-01-01 LAB — GLUCOSE, CAPILLARY: Glucose-Capillary: 112 mg/dL — ABNORMAL HIGH (ref 70–99)

## 2023-01-01 MED ORDER — BISACODYL 5 MG PO TBEC
5.0000 mg | DELAYED_RELEASE_TABLET | Freq: Once | ORAL | Status: AC
  Administered 2023-01-01: 5 mg via ORAL
  Filled 2023-01-01: qty 1

## 2023-01-01 MED ORDER — POTASSIUM CHLORIDE CRYS ER 20 MEQ PO TBCR
40.0000 meq | EXTENDED_RELEASE_TABLET | Freq: Once | ORAL | Status: AC
  Administered 2023-01-01: 40 meq via ORAL
  Filled 2023-01-01: qty 2

## 2023-01-01 NOTE — Progress Notes (Addendum)
PROGRESS NOTE    Katrina Stewart  VWU:[id number] DOB: 04-Mar-1995 DOA: 12/29/2022 PCP: Georganna Skeans, MD   Brief Narrative: 27 year old with past medical history significant for depression, anxiety, BMI 41, alcohol abuse presented with abdominal pain, back pain nausea and vomiting found to have acute pancreatitis.  Evaluation in the ED CT abdomen and pelvis negative for acute finding, right ovary was not visualized on pelvic ultrasound which otherwise was normal.  Lipase 202.    Assessment & Plan:   Principal Problem:   Acute pancreatitis Active Problems:   Alcohol abuse   Anxiety and depression   1-Acute pancreatitis: -Presents with abdominal pain, nausea vomiting history of alcohol use.  Lipase 200. -CT abdomen and pelvis no acute finding. -IV fluids, IV protonix.  -pain management IV dilaudid, PRN Oxycodone.  Support care.  Pain improving, 2/10. Plan to advanced diet to full liquid and as tolerated.  Home tomorrow.   Alcohol abuse: -Continue CIWA -Continue folic acid and thiamine No evidence of withdrawal, last drink more than a week ago.   Anemia; Iron deficiency. Resume iron at discharge.   Depression, anxiety: -Continue with Prozac and Seroquel  HTN: PRN Hydralazine  Might need prescription medication at discharge.   Hypokalemia; replete orally.  Hyponatremia; resolved with fluids.   Bipolar; continue with Seroquel and Prozac.  Constipation; continue with senna, dulcolax Estimated body mass index is 40.61 kg/m as calculated from the following:   Height as of this encounter: 5\' 9"  (1.753 m).   Weight as of this encounter: 124.7 kg.   DVT prophylaxis: Lovenox Code Status: Full code Family Communication:Mother at bedside 11//29 Disposition Plan:  Status is: Observation The patient remains OBS appropriate and will d/c before 2 midnights.    Consultants:  None  Procedures:  None Antimicrobials:    Subjective: Feels better, abdominal pain 3/10.  Complaints of lower back pain.   Objective: Vitals:   12/31/22 1210 12/31/22 1818 12/31/22 1929 01/01/23 0507  BP: 114/66 124/82 (!) 133/93 (!) 128/92  Pulse: 72 86 80 78  Resp: 15 16 18 17   Temp: 98 F (36.7 C) 98.7 F (37.1 C) 98.4 F (36.9 C) 97.7 F (36.5 C)  TempSrc:  Oral Oral Oral  SpO2: 100% 95% 99% 97%  Weight:      Height:        Intake/Output Summary (Last 24 hours) at 01/01/2023 1242 Last data filed at 01/01/2023 0600 Gross per 24 hour  Intake 2666.08 ml  Output --  Net 2666.08 ml   Filed Weights   12/29/22 1957  Weight: 124.7 kg    Examination:  General exam: NAD Respiratory system: CTA Cardiovascular system: S 1, s 2 RRR Gastrointestinal system: BS present, soft, nt Central nervous system: Non focal.   Extremities: no edema   Data Reviewed: I have personally reviewed following labs and imaging studies  CBC: Recent Labs  Lab 12/29/22 2022 12/30/22 0622 12/31/22 0344 01/01/23 0352  WBC 7.0 6.0 5.5 5.2  HGB 10.7* 9.8* 10.4* 9.2*  HCT 35.3* 33.2* 35.0* 31.2*  MCV 68.5* 70.0* 69.9* 70.4*  PLT 371 350 313 324   Basic Metabolic Panel: Recent Labs  Lab 12/29/22 2022 12/30/22 0622 12/31/22 0344 01/01/23 0352  NA 137 134* 134* 139  K 3.7 3.2* 3.2* 3.4*  CL 106 102 101 105  CO2 22 24 25 26   GLUCOSE 98 84 78 84  BUN 12 9 8 10   CREATININE 0.83 0.78 0.84 0.96  CALCIUM 9.3 8.6* 9.0 8.9   GFR: Estimated  Creatinine Clearance: 122.3 mL/min (by C-G formula based on SCr of 0.96 mg/dL). Liver Function Tests: Recent Labs  Lab 12/29/22 2022 12/30/22 0622  AST 17 14*  ALT 13 13  ALKPHOS 66 58  BILITOT 0.6 0.5  PROT 8.1 6.9  ALBUMIN 4.2 3.7   Recent Labs  Lab 12/29/22 2022  LIPASE 202*   No results for input(s): "AMMONIA" in the last 168 hours. Coagulation Profile: No results for input(s): "INR", "PROTIME" in the last 168 hours. Cardiac Enzymes: No results for input(s): "CKTOTAL", "CKMB", "CKMBINDEX", "TROPONINI" in the last 168  hours. BNP (last 3 results) No results for input(s): "PROBNP" in the last 8760 hours. HbA1C: No results for input(s): "HGBA1C" in the last 72 hours. CBG: Recent Labs  Lab 12/31/22 0724 12/31/22 1205 12/31/22 1632 12/31/22 2017 12/31/22 2041  GLUCAP 79 71 80 69* 101*   Lipid Profile: Recent Labs    12/30/22 0622  TRIG 49   Thyroid Function Tests: No results for input(s): "TSH", "T4TOTAL", "FREET4", "T3FREE", "THYROIDAB" in the last 72 hours. Anemia Panel: No results for input(s): "VITAMINB12", "FOLATE", "FERRITIN", "TIBC", "IRON", "RETICCTPCT" in the last 72 hours. Sepsis Labs: No results for input(s): "PROCALCITON", "LATICACIDVEN" in the last 168 hours.  Recent Results (from the past 240 hour(s))  Resp panel by RT-PCR (RSV, Flu A&B, Covid) Anterior Nasal Swab     Status: None   Collection Time: 12/29/22 11:25 PM   Specimen: Anterior Nasal Swab  Result Value Ref Range Status   SARS Coronavirus 2 by RT PCR NEGATIVE NEGATIVE Final    Comment: (NOTE) SARS-CoV-2 target nucleic acids are NOT DETECTED.  The SARS-CoV-2 RNA is generally detectable in upper respiratory specimens during the acute phase of infection. The lowest concentration of SARS-CoV-2 viral copies this assay can detect is 138 copies/mL. A negative result does not preclude SARS-Cov-2 infection and should not be used as the sole basis for treatment or other patient management decisions. A negative result may occur with  improper specimen collection/handling, submission of specimen other than nasopharyngeal swab, presence of viral mutation(s) within the areas targeted by this assay, and inadequate number of viral copies(<138 copies/mL). A negative result must be combined with clinical observations, patient history, and epidemiological information. The expected result is Negative.  Fact Sheet for Patients:  BloggerCourse.com  Fact Sheet for Healthcare Providers:   SeriousBroker.it  This test is no t yet approved or cleared by the [place] FDA and  has been authorized for detection and/or diagnosis of SARS-CoV-2 by FDA under an Emergency Use Authorization (EUA). This EUA will remain  in effect (meaning this test can be used) for the duration of the COVID-19 declaration under Section 564(b)(1) of the Act, 21 U.S.C.section 360bbb-3(b)(1), unless the authorization is terminated  or revoked sooner.       Influenza A by PCR NEGATIVE NEGATIVE Final   Influenza B by PCR NEGATIVE NEGATIVE Final    Comment: (NOTE) The Xpert Xpress SARS-CoV-2/FLU/RSV plus assay is intended as an aid in the diagnosis of influenza from Nasopharyngeal swab specimens and should not be used as a sole basis for treatment. Nasal washings and aspirates are unacceptable for Xpert Xpress SARS-CoV-2/FLU/RSV testing.  Fact Sheet for Patients: BloggerCourse.com  Fact Sheet for Healthcare Providers: SeriousBroker.it  This test is not yet approved or cleared by the [place] FDA and has been authorized for detection and/or diagnosis of SARS-CoV-2 by FDA under an Emergency Use Authorization (EUA). This EUA will remain in effect (meaning this test can be  used) for the duration of the COVID-19 declaration under Section 564(b)(1) of the Act, 21 U.S.C. section 360bbb-3(b)(1), unless the authorization is terminated or revoked.     Resp Syncytial Virus by PCR NEGATIVE NEGATIVE Final    Comment: (NOTE) Fact Sheet for Patients: BloggerCourse.com  Fact Sheet for Healthcare Providers: SeriousBroker.it  This test is not yet approved or cleared by the [place] FDA and has been authorized for detection and/or diagnosis of SARS-CoV-2 by FDA under an Emergency Use Authorization (EUA). This EUA will remain in effect (meaning this test can be used) for  the duration of the COVID-19 declaration under Section 564(b)(1) of the Act, 21 U.S.C. section 360bbb-3(b)(1), unless the authorization is terminated or revoked.  Performed at [facility], [address], [place], [place] [address]          Radiology Studies: No results found.      Scheduled Meds:  enoxaparin (LOVENOX) injection  40 mg Subcutaneous Q24H   FLUoxetine  60 mg Oral Daily   folic acid  1 mg Oral Daily   LORazepam  0-4 mg Intravenous Q12H   multivitamin with minerals  1 tablet Oral Daily   pantoprazole (PROTONIX) IV  40 mg Intravenous Q12H   QUEtiapine  300 mg Oral QHS   senna-docusate  1 tablet Oral BID   thiamine  100 mg Oral Daily   Or   thiamine  100 mg Intravenous Daily   Continuous Infusions:  lactated ringers       LOS: 2 days    Time spent: 35 minutes    Wrigley Plasencia A Ramzi Brathwaite, MD [facility]   If 7PM-7AM, please contact night-coverage [email or web address]  01/01/2023, 12:42 PM

## 2023-01-02 DIAGNOSIS — R748 Abnormal levels of other serum enzymes: Secondary | ICD-10-CM

## 2023-01-02 LAB — BASIC METABOLIC PANEL
Anion gap: 7 (ref 5–15)
BUN: 12 mg/dL (ref 6–20)
CO2: 25 mmol/L (ref 22–32)
Calcium: 8.8 mg/dL — ABNORMAL LOW (ref 8.9–10.3)
Chloride: 104 mmol/L (ref 98–111)
Creatinine, Ser: 1 mg/dL (ref 0.44–1.00)
GFR, Estimated: >60 mL/min (ref >60)
Glucose, Bld: 103 mg/dL — ABNORMAL HIGH (ref 70–99)
Potassium: 3.4 mmol/L — ABNORMAL LOW (ref 3.5–5.1)
Sodium: 136 mmol/L (ref 135–145)

## 2023-01-02 MED ORDER — ADULT MULTIVITAMIN W/MINERALS CH: 1.0000 | ORAL_TABLET | Freq: Every day | ORAL | 0 refills | Status: DC

## 2023-01-02 MED ORDER — OXYCODONE HCL 5 MG PO TABS: 5.0000 mg | ORAL_TABLET | Freq: Four times a day (QID) | ORAL | 0 refills | Status: AC | PRN

## 2023-01-02 MED ORDER — POTASSIUM CHLORIDE CRYS ER 20 MEQ PO TBCR
40.0000 meq | EXTENDED_RELEASE_TABLET | Freq: Once | ORAL | Status: AC
  Administered 2023-01-02: 40 meq via ORAL
  Filled 2023-01-02: qty 2

## 2023-01-02 MED ORDER — VITAMIN B-1 100 MG PO TABS: 100.0000 mg | ORAL_TABLET | Freq: Every day | ORAL | 0 refills | Status: DC

## 2023-01-02 MED ORDER — SENNOSIDES-DOCUSATE SODIUM 8.6-50 MG PO TABS: 1.0000 | ORAL_TABLET | Freq: Two times a day (BID) | ORAL | 0 refills | Status: DC

## 2023-01-02 MED ORDER — FOLIC ACID 1 MG PO TABS: 1.0000 mg | ORAL_TABLET | Freq: Every day | ORAL | 0 refills | Status: DC

## 2023-01-02 MED ORDER — POLYETHYLENE GLYCOL 3350 17 G PO PACK: 17.0000 g | PACK | Freq: Every day | ORAL | 0 refills | Status: DC

## 2023-01-02 NOTE — Plan of Care (Signed)
  Problem: Clinical Measurements: Goal: Ability to maintain clinical measurements within normal limits will improve Outcome: Progressing   Problem: Nutrition: Goal: Adequate nutrition will be maintained Outcome: Progressing   Problem: Coping: Goal: Level of anxiety will decrease Outcome: Progressing   Problem: Elimination: Goal: Will not experience complications related to urinary retention Outcome: Adequate for Discharge   Problem: Pain Management: Goal: General experience of comfort will improve Outcome: Progressing   Problem: Safety: Goal: Ability to remain free from injury will improve Outcome: Progressing

## 2023-01-02 NOTE — Discharge Summary (Signed)
Physician Discharge Summary   Patient: Katrina Stewart MRN: [id number] DOB: [birth year]  Admit date:     12/29/2022  Discharge date: 01/02/23  Discharge Physician: Alba Cory   PCP: Georganna Skeans, MD   Recommendations at discharge:    Needs referral to GI and GYN.   Discharge Diagnoses: Principal Problem:   Acute pancreatitis Active Problems:   Alcohol abuse   Anxiety and depression  Resolved Problems:   * No resolved hospital problems. *  Hospital Course:  27 year old with past medical history significant for depression, anxiety, BMI 41, alcohol abuse presented with abdominal pain, back pain nausea and vomiting found to have acute pancreatitis.  Evaluation in the ED CT abdomen and pelvis negative for acute finding, right ovary was not visualized on pelvic ultrasound which otherwise was normal.  Lipase 202.   Assessment and Plan: 1-Acute pancreatitis: -Presents with abdominal pain, nausea vomiting history of alcohol use.  Lipase 200. -CT abdomen and pelvis no acute finding. -IV fluids, IV protonix.  -pain management IV dilaudid, PRN Oxycodone.  Support care.  She has been tolerating diet, no more vomiting.  She is still having right LQ pain abdomen is soft, no rigidity, denies dysuria no vaginal discharge. She has been having BM. CT abdomen pelvis appendix was normal. Adnexa normal. She will need to follow up with [facility] and GI   Alcohol abuse: -Continue CIWA -Continue folic acid and thiamine No evidence of withdrawal, last drink more than a week ago.    Anemia; Iron deficiency. Resume iron at discharge.    Depression, anxiety: -Continue with Prozac and Seroquel   HTN: PRN Hydralazine  Might need prescription medication at discharge.    Hypokalemia; Replaced.  Hyponatremia; resolved with fluids.    Bipolar; continue with Seroquel and Prozac.  Constipation; continue with senna, dulcolax Estimated body mass index is 40.61 kg/m as calculated from the  following:   Height as of this encounter: 5\' 9"  (1.753 m).   Weight as of this encounter: 124.7 kg.            Consultants: None Procedures performed: none  Disposition: Home Diet recommendation:  Discharge Diet Orders (From admission, onward)     Start     Ordered   01/02/23 0000  Diet - low sodium heart healthy        01/02/23 1104           Cardiac diet DISCHARGE MEDICATION: Allergies as of 01/02/2023   No Known Allergies      Medication List     TAKE these medications    FLUoxetine 20 MG capsule Commonly known as: PROZAC Take 3 capsules (60 mg total) by mouth daily.   folic acid 1 MG tablet Commonly known as: FOLVITE Take 1 tablet (1 mg total) by mouth daily. Start taking on: January 03, 2023   Iron (Ferrous Sulfate) 325 (65 Fe) MG Tabs Take 325 mg by mouth 2 (two) times daily.   multivitamin with minerals Tabs tablet Take 1 tablet by mouth daily. Start taking on: January 03, 2023   ondansetron 4 MG tablet Commonly known as: ZOFRAN Take 1 tablet (4 mg total) by mouth every 6 (six) hours.   oxyCODONE 5 MG immediate release tablet Commonly known as: Oxy IR/ROXICODONE Take 1 tablet (5 mg total) by mouth every 6 (six) hours as needed for up to 3 days for moderate pain (pain score 4-6).   phentermine 37.5 MG tablet Commonly known as: ADIPEX-P Take 1 tablet (37.5 mg total) by  mouth daily before breakfast.   polyethylene glycol 17 g packet Commonly known as: MiraLax Take 17 g by mouth daily.   QUEtiapine 300 MG tablet Commonly known as: SEROQUEL Take 1 tablet (300 mg total) by mouth at bedtime.   senna-docusate 8.6-50 MG tablet Commonly known as: Senokot-S Take 1 tablet by mouth 2 (two) times daily.   thiamine 100 MG tablet Commonly known as: Vitamin B-1 Take 1 tablet (100 mg total) by mouth daily. Start taking on: January 03, 2023        Follow-up Information     Georganna Skeans, MD.   Specialty: Family Medicine Contact  information: [address] [place] [address] [phone]                Discharge Exam: Ceasar Mons Weights   12/29/22 1957  Weight: 124.7 kg   General NAD  Condition at discharge: stable  The results of significant diagnostics from this hospitalization (including imaging, microbiology, ancillary and laboratory) are listed below for reference.   Imaging Studies: US Pelvis Complete  Result Date: 12/30/2022 CLINICAL DATA:  Lower abdominal pain EXAM: TRANSABDOMINAL AND TRANSVAGINAL ULTRASOUND OF PELVIS DOPPLER ULTRASOUND OF OVARIES TECHNIQUE: Both transabdominal and transvaginal ultrasound examinations of the pelvis were performed. Transabdominal technique was performed for global imaging of the pelvis including uterus, ovaries, adnexal regions, and pelvic cul-de-sac. It was necessary to proceed with endovaginal exam following the transabdominal exam to visualize the endometrium and ovaries bilaterally. Color and duplex Doppler ultrasound was utilized to evaluate blood flow to the ovaries. COMPARISON:  CT 12/29/2022 FINDINGS: Uterus Measurements: 10.2 x 5.6 x 6.3 cm = volume: 186 mL. The uterus is anteverted. The cervix is unremarkable. No fibroids or other mass visualized. Endometrium Thickness: 3 mm.  No focal abnormality visualized. Right ovary Not visualized.  No adnexal mass. Left ovary Measurements: 2.5 x 1.8 x 1.8 cm = volume: 4 mL. Normal appearance/no adnexal mass. Pulsed Doppler evaluation of the visualized left ovary demonstrates normal low-resistance arterial and venous waveforms. Other findings No abnormal free fluid. IMPRESSION: 1. Nonvisualization of the right ovary. Otherwise normal pelvic sonogram. Electronically Signed   By: Helyn Numbers M.D.   On: 12/30/2022 01:13   US Transvaginal Non-OB  Result Date: 12/30/2022 CLINICAL DATA:  Lower abdominal pain EXAM: TRANSABDOMINAL AND TRANSVAGINAL ULTRASOUND OF PELVIS DOPPLER ULTRASOUND OF OVARIES TECHNIQUE: Both  transabdominal and transvaginal ultrasound examinations of the pelvis were performed. Transabdominal technique was performed for global imaging of the pelvis including uterus, ovaries, adnexal regions, and pelvic cul-de-sac. It was necessary to proceed with endovaginal exam following the transabdominal exam to visualize the endometrium and ovaries bilaterally. Color and duplex Doppler ultrasound was utilized to evaluate blood flow to the ovaries. COMPARISON:  CT 12/29/2022 FINDINGS: Uterus Measurements: 10.2 x 5.6 x 6.3 cm = volume: 186 mL. The uterus is anteverted. The cervix is unremarkable. No fibroids or other mass visualized. Endometrium Thickness: 3 mm.  No focal abnormality visualized. Right ovary Not visualized.  No adnexal mass. Left ovary Measurements: 2.5 x 1.8 x 1.8 cm = volume: 4 mL. Normal appearance/no adnexal mass. Pulsed Doppler evaluation of the visualized left ovary demonstrates normal low-resistance arterial and venous waveforms. Other findings No abnormal free fluid. IMPRESSION: 1. Nonvisualization of the right ovary. Otherwise normal pelvic sonogram. Electronically Signed   By: Helyn Numbers M.D.   On: 12/30/2022 01:13   [place] Art/Ven Flow Abd Pelv Doppler  Result Date: 12/30/2022 CLINICAL DATA:  Lower abdominal pain EXAM: TRANSABDOMINAL AND TRANSVAGINAL  ULTRASOUND OF PELVIS DOPPLER ULTRASOUND OF OVARIES TECHNIQUE: Both transabdominal and transvaginal ultrasound examinations of the pelvis were performed. Transabdominal technique was performed for global imaging of the pelvis including uterus, ovaries, adnexal regions, and pelvic cul-de-sac. It was necessary to proceed with endovaginal exam following the transabdominal exam to visualize the endometrium and ovaries bilaterally. Color and duplex Doppler ultrasound was utilized to evaluate blood flow to the ovaries. COMPARISON:  CT 12/29/2022 FINDINGS: Uterus Measurements: 10.2 x 5.6 x 6.3 cm = volume: 186 mL. The uterus is anteverted. The  cervix is unremarkable. No fibroids or other mass visualized. Endometrium Thickness: 3 mm.  No focal abnormality visualized. Right ovary Not visualized.  No adnexal mass. Left ovary Measurements: 2.5 x 1.8 x 1.8 cm = volume: 4 mL. Normal appearance/no adnexal mass. Pulsed Doppler evaluation of the visualized left ovary demonstrates normal low-resistance arterial and venous waveforms. Other findings No abnormal free fluid. IMPRESSION: 1. Nonvisualization of the right ovary. Otherwise normal pelvic sonogram. Electronically Signed   By: Helyn Numbers M.D.   On: 12/30/2022 01:13   CT ABDOMEN PELVIS W CONTRAST  Result Date: 12/29/2022 CLINICAL DATA:  Nausea vomiting abdominal pain EXAM: CT ABDOMEN AND PELVIS WITH CONTRAST TECHNIQUE: Multidetector CT imaging of the abdomen and pelvis was performed using the standard protocol following bolus administration of intravenous contrast. RADIATION DOSE REDUCTION: This exam was performed according to the departmental dose-optimization program which includes automated exposure control, adjustment of the mA and/or kV according to patient size and/or use of iterative reconstruction technique. CONTRAST:  OMNIPAQUE IOHEXOL 300 MG/ML  SOLN COMPARISON:  None Available. FINDINGS: Lower chest: No acute abnormality. Hepatobiliary: No focal liver abnormality is seen. No gallstones, gallbladder wall thickening, or biliary dilatation. Pancreas: Unremarkable. No pancreatic ductal dilatation or surrounding inflammatory changes. Spleen: Normal in size without focal abnormality. Adrenals/Urinary Tract: Adrenal glands are unremarkable. Kidneys are normal, without renal calculi, focal lesion, or hydronephrosis. Bladder is unremarkable. Stomach/Bowel: Stomach is within normal limits. Appendix appears normal. No evidence of bowel wall thickening, distention, or inflammatory changes. Vascular/Lymphatic: No significant vascular findings are present. No enlarged abdominal or pelvic lymph  nodes. Reproductive: Uterus and bilateral adnexa are unremarkable. Other: No abdominal wall hernia or abnormality. No abdominopelvic ascites. Musculoskeletal: No acute or significant osseous findings. IMPRESSION: Negative CT of the abdomen and pelvis. Electronically Signed   By: Jasmine Pang M.D.   On: 12/29/2022 22:05    Microbiology: Results for orders placed or performed during the hospital encounter of 12/29/22  Resp panel by RT-PCR (RSV, Flu A&B, Covid) Anterior Nasal Swab     Status: None   Collection Time: 12/29/22 11:25 PM   Specimen: Anterior Nasal Swab  Result Value Ref Range Status   SARS Coronavirus 2 by RT PCR NEGATIVE NEGATIVE Final    Comment: (NOTE) SARS-CoV-2 target nucleic acids are NOT DETECTED.  The SARS-CoV-2 RNA is generally detectable in upper respiratory specimens during the acute phase of infection. The lowest concentration of SARS-CoV-2 viral copies this assay can detect is 138 copies/mL. A negative result does not preclude SARS-Cov-2 infection and should not be used as the sole basis for treatment or other patient management decisions. A negative result may occur with  improper specimen collection/handling, submission of specimen other than nasopharyngeal swab, presence of viral mutation(s) within the areas targeted by this assay, and inadequate number of viral copies(<138 copies/mL). A negative result must be combined with clinical observations, patient history, and epidemiological information. The expected result is Negative.  Fact Sheet  for Patients:  BloggerCourse.com  Fact Sheet for Healthcare Providers:  SeriousBroker.it  This test is no t yet approved or cleared by the [place] FDA and  has been authorized for detection and/or diagnosis of SARS-CoV-2 by FDA under an Emergency Use Authorization (EUA). This EUA will remain  in effect (meaning this test can be used) for the duration of  the COVID-19 declaration under Section 564(b)(1) of the Act, 21 U.S.C.section 360bbb-3(b)(1), unless the authorization is terminated  or revoked sooner.       Influenza A by PCR NEGATIVE NEGATIVE Final   Influenza B by PCR NEGATIVE NEGATIVE Final    Comment: (NOTE) The Xpert Xpress SARS-CoV-2/FLU/RSV plus assay is intended as an aid in the diagnosis of influenza from Nasopharyngeal swab specimens and should not be used as a sole basis for treatment. Nasal washings and aspirates are unacceptable for Xpert Xpress SARS-CoV-2/FLU/RSV testing.  Fact Sheet for Patients: BloggerCourse.com  Fact Sheet for Healthcare Providers: SeriousBroker.it  This test is not yet approved or cleared by the [place] FDA and has been authorized for detection and/or diagnosis of SARS-CoV-2 by FDA under an Emergency Use Authorization (EUA). This EUA will remain in effect (meaning this test can be used) for the duration of the COVID-19 declaration under Section 564(b)(1) of the Act, 21 U.S.C. section 360bbb-3(b)(1), unless the authorization is terminated or revoked.     Resp Syncytial Virus by PCR NEGATIVE NEGATIVE Final    Comment: (NOTE) Fact Sheet for Patients: BloggerCourse.com  Fact Sheet for Healthcare Providers: SeriousBroker.it  This test is not yet approved or cleared by the [place] FDA and has been authorized for detection and/or diagnosis of SARS-CoV-2 by FDA under an Emergency Use Authorization (EUA). This EUA will remain in effect (meaning this test can be used) for the duration of the COVID-19 declaration under Section 564(b)(1) of the Act, 21 U.S.C. section 360bbb-3(b)(1), unless the authorization is terminated or revoked.  Performed at [facility], [address], [place], [place] [address]     Labs: CBC: Recent Labs  Lab 12/29/22 2022  12/30/22 0622 12/31/22 0344 01/01/23 0352  WBC 7.0 6.0 5.5 5.2  HGB 10.7* 9.8* 10.4* 9.2*  HCT 35.3* 33.2* 35.0* 31.2*  MCV 68.5* 70.0* 69.9* 70.4*  PLT 371 350 313 324   Basic Metabolic Panel: Recent Labs  Lab 12/29/22 2022 12/30/22 0622 12/31/22 0344 01/01/23 0352 01/02/23 0323  NA 137 134* 134* 139 136  K 3.7 3.2* 3.2* 3.4* 3.4*  CL 106 102 101 105 104  CO2 22 24 25 26 25   GLUCOSE 98 84 78 84 103*  BUN 12 9 8 10 12   CREATININE 0.83 0.78 0.84 0.96 1.00  CALCIUM 9.3 8.6* 9.0 8.9 8.8*   Liver Function Tests: Recent Labs  Lab 12/29/22 2022 12/30/22 0622  AST 17 14*  ALT 13 13  ALKPHOS 66 58  BILITOT 0.6 0.5  PROT 8.1 6.9  ALBUMIN 4.2 3.7   CBG: Recent Labs  Lab 12/31/22 1205 12/31/22 1632 12/31/22 2017 12/31/22 2041 01/01/23 2348  GLUCAP 71 80 69* 101* 112*    Discharge time spent: greater than 30 minutes.  Signed: Alba Cory, MD [facility] 01/02/2023

## 2023-01-03 ENCOUNTER — Telehealth: Payer: Self-pay | Admitting: Family Medicine

## 2023-01-03 ENCOUNTER — Encounter: Payer: Self-pay | Admitting: Physician Assistant

## 2023-01-03 ENCOUNTER — Telehealth: Payer: Self-pay

## 2023-01-03 NOTE — Telephone Encounter (Signed)
Pt is calling in returning a call from Cassandra. Please follow up with pt.

## 2023-01-03 NOTE — Transitions of Care (Post Inpatient/ED Visit) (Signed)
   01/03/2023  Name: Katrina Stewart MRN: [id number] DOB: July 09, 1995  Today's TOC FU Call Status:    Attempted to reach the patient regarding the most recent Inpatient/ED visit.  Follow Up Plan: Additional outreach attempts will be made to reach the patient to complete the Transitions of Care (Post Inpatient/ED visit) call.   Signature Elsie Lincoln, RN

## 2023-01-04 ENCOUNTER — Telehealth: Payer: Self-pay

## 2023-01-04 NOTE — Transitions of Care (Post Inpatient/ED Visit) (Signed)
   01/04/2023  Name: Katrina Stewart MRN: [id number] DOB: 1995/12/11  Today's TOC FU Call Status: Today's TOC FU Call Status:: Successful TOC FU Call Completed Unsuccessful Call (1st Attempt) Date: 01/03/23 Unsuccessful Call (2nd Attempt) Date: 01/04/23 [facility] FU Call Complete Date: 01/04/23 Patient's Name and Date of Birth confirmed.  Transition Care Management Follow-up Telephone Call Date of Discharge: 01/02/23 Discharge Facility: [facility]) Type of Discharge: Inpatient Admission Primary Inpatient Discharge Diagnosis:: acute pancreatitis How have you been since you were released from the hospital?: Better (She said she is still in some pain but doing better) Any questions or concerns?: No  Items Reviewed: Did you receive and understand the discharge instructions provided?: Yes Medications obtained,verified, and reconciled?: Partial Review Completed Reason for Partial Mediation Review: She said she has all medications and did not have any questions about the med regime and did not need to review the med list. Any new allergies since your discharge?: No Dietary orders reviewed?: Yes Type of Diet Ordered:: heart healthy, low sodium. She said she is having a hard time tolerating a regular diet and will return to eating more bland foods.  She said she has been abstaining from alcohol Do you have support at home?: Yes Name of Support/Comfort Primary Source: She has help from family  Medications Reviewed Today: Medications Reviewed Today   Medications were not reviewed in this encounter     Home Care and Equipment/Supplies: Were Home Health Services Ordered?: No Any new equipment or medical supplies ordered?: No  Functional Questionnaire: Do you need assistance with bathing/showering or dressing?: No Do you need assistance with meal preparation?: No Do you need assistance with eating?: No Do you have difficulty maintaining continence: No Do you need assistance with getting  out of bed/getting out of a chair/moving?: No Do you have difficulty managing or taking your medications?: No  Follow up appointments reviewed: PCP Follow-up appointment confirmed?: Yes Date of PCP follow-up appointment?: 01/07/23 Follow-up Provider: Dr Andrey Campanile [facility] Follow-up appointment confirmed?: NA Do you need transportation to your follow-up appointment?: No Do you understand care options if your condition(s) worsen?: Yes-patient verbalized understanding    SIGNATURE Robyne Peers, RN

## 2023-01-04 NOTE — Transitions of Care (Post Inpatient/ED Visit) (Signed)
   01/04/2023  Name: Katrina Stewart MRN: [id number] DOB: 1995-04-14  Today's TOC FU Call Status: Today's TOC FU Call Status:: Unsuccessful Call (2nd Attempt) Unsuccessful Call (1st Attempt) Date: 01/03/23 Unsuccessful Call (2nd Attempt) Date: 01/04/23  Attempted to reach the patient regarding the most recent Inpatient/ED visit.  Follow Up Plan: Additional outreach attempts will be made to reach the patient to complete the Transitions of Care (Post Inpatient/ED visit) call.   Signature  Robyne Peers, RN

## 2023-01-04 NOTE — Telephone Encounter (Signed)
I spoke to the patient since this message was received

## 2023-01-06 ENCOUNTER — Encounter (HOSPITAL_COMMUNITY): Payer: Self-pay

## 2023-01-06 ENCOUNTER — Ambulatory Visit (HOSPITAL_COMMUNITY): Payer: Medicaid Other

## 2023-01-06 DIAGNOSIS — F4312 Post-traumatic stress disorder, chronic: Secondary | ICD-10-CM

## 2023-01-06 DIAGNOSIS — F411 Generalized anxiety disorder: Secondary | ICD-10-CM

## 2023-01-06 DIAGNOSIS — F1090 Alcohol use, unspecified, uncomplicated: Secondary | ICD-10-CM

## 2023-01-06 DIAGNOSIS — F3160 Bipolar disorder, current episode mixed, unspecified: Secondary | ICD-10-CM
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
## 2023-01-10 ENCOUNTER — Other Ambulatory Visit (HOSPITAL_COMMUNITY)
Admission: RE | Admit: 2023-01-10 | Discharge: 2023-01-10 | Disposition: A | Discharge status: Home / Self Care | Payer: Medicaid Other | Source: Ambulatory Visit | Attending: Family Medicine | Admitting: Family Medicine

## 2023-01-10 ENCOUNTER — Ambulatory Visit: Payer: Medicaid Other | Admitting: Family Medicine

## 2023-01-10 ENCOUNTER — Encounter: Payer: Self-pay | Admitting: Family Medicine

## 2023-01-10 VITALS — BP 138/83 | HR 94 | Temp 97.7°F | Resp 16 | Wt 275.8 lb

## 2023-01-10 DIAGNOSIS — E66813 Obesity, class 3: Secondary | ICD-10-CM | POA: Diagnosis not present

## 2023-01-10 DIAGNOSIS — Z7689 Persons encountering health services in other specified circumstances: Secondary | ICD-10-CM

## 2023-01-10 DIAGNOSIS — Z113 Encounter for screening for infections with a predominantly sexual mode of transmission: Secondary | ICD-10-CM

## 2023-01-10 DIAGNOSIS — Z6841 Body Mass Index (BMI) 40.0 and over, adult: Secondary | ICD-10-CM

## 2023-01-10 MED ORDER — PHENTERMINE HCL 37.5 MG PO TABS: 37.5000 mg | ORAL_TABLET | Freq: Every day | ORAL | 0 refills | Status: DC

## 2023-01-11 ENCOUNTER — Ambulatory Visit: Payer: Medicaid Other

## 2023-01-11 LAB — CERVICOVAGINAL ANCILLARY ONLY
Bacterial Vaginitis (gardnerella): POSITIVE — AB
Candida Glabrata: NEGATIVE
Candida Vaginitis: POSITIVE — AB
Chlamydia: NEGATIVE
Comment: NEGATIVE
Comment: NEGATIVE
Comment: NEGATIVE
Comment: NEGATIVE
Comment: NEGATIVE
Comment: NORMAL
Neisseria Gonorrhea: NEGATIVE
Trichomonas: NEGATIVE

## 2023-01-12 ENCOUNTER — Encounter: Payer: Self-pay | Admitting: Family Medicine

## 2023-01-12 MED ORDER — FLUCONAZOLE 150 MG PO TABS: 150.0000 mg | ORAL_TABLET | Freq: Once | ORAL | 0 refills | Status: AC

## 2023-01-12 MED ORDER — METRONIDAZOLE 500 MG PO TABS: 500.0000 mg | ORAL_TABLET | Freq: Two times a day (BID) | ORAL | 0 refills | Status: AC

## 2023-01-12 NOTE — Progress Notes (Signed)
Established Patient Office Visit  Subjective    Patient ID: Katrina Stewart, female    DOB: 1995/11/05  Age: 27 y.o. MRN: [id number]  CC: No chief complaint on file.   HPI [name] presents for desiring std screen. Patient is also for routine weight management and believes she is doing well with present management.   Outpatient Encounter Medications as of 01/10/2023  Medication Sig   FLUoxetine (PROZAC) 20 MG capsule Take 3 capsules (60 mg total) by mouth daily.   folic acid (FOLVITE) 1 MG tablet Take 1 tablet (1 mg total) by mouth daily.   Iron, Ferrous Sulfate, 325 (65 Fe) MG TABS Take 325 mg by mouth 2 (two) times daily.   Multiple Vitamin (MULTIVITAMIN WITH MINERALS) TABS tablet Take 1 tablet by mouth daily.   ondansetron (ZOFRAN) 4 MG tablet Take 1 tablet (4 mg total) by mouth every 6 (six) hours.   polyethylene glycol (MIRALAX) 17 g packet Take 17 g by mouth daily.   QUEtiapine (SEROQUEL) 300 MG tablet Take 1 tablet (300 mg total) by mouth at bedtime.   senna-docusate (SENOKOT-S) 8.6-50 MG tablet Take 1 tablet by mouth 2 (two) times daily.   thiamine (VITAMIN B-1) 100 MG tablet Take 1 tablet (100 mg total) by mouth daily.   [DISCONTINUED] phentermine (ADIPEX-P) 37.5 MG tablet Take 1 tablet (37.5 mg total) by mouth daily before breakfast.   phentermine (ADIPEX-P) 37.5 MG tablet Take 1 tablet (37.5 mg total) by mouth daily before breakfast.   No facility-administered encounter medications on file as of 01/10/2023.    Past Medical History:  Diagnosis Date   Anxiety    COVID-19 virus infection 12/05/2018   + again on 2/4 (asymptomatic)  postiive test in MAU on 11-2   Depression    Depression    Phreesia 12/17/2019   Depression    Phreesia 12/30/2019   Essential hypertension    Gestational diabetes mellitus (GDM)    Normal postpartum 2 hr GTT   Group B streptococcal infection in pregnancy 03/13/2019   Rh negative state in antepartum period 09/03/2015   [x]  needs rhogam  at 28 weeks  [name] postive anti-D on initial lab. Patient received rhogam on 08/02/18 during MAU visit   Rubella non-immune status, antepartum 09/03/2015   Vaginal Pap smear, abnormal     Past Surgical History:  Procedure Laterality Date   BREAST SURGERY  2013   Lt & Rt excision juvenile fibroadenoma   MASS EXCISION  10/29/2011   Procedure: EXCISION MASS;  Surgeon: Shelly Rubenstein, MD;  Location: [facility];  Service: General;  Laterality: Bilateral;  excision of bilateral breast masses    Family History  Problem Relation Age of Onset   Cancer Maternal Grandmother        lung cancer   Cancer Other        bladder cancer   Asthma Brother     Social History   Socioeconomic History   Marital status: Single    Spouse name: Not on file   Number of children: Not on file   Years of education: Not on file   Highest education level: Some college, no degree  Occupational History    Comment: unemployed  Tobacco Use   Smoking status: Never    Passive exposure: Never   Smokeless tobacco: Never  Vaping Use   Vaping status: Never Used  Substance and Sexual Activity   Alcohol use: Yes    Alcohol/week: 2.0 - 3.0 standard drinks of alcohol  Types: 2 - 3 Shots of liquor per week    Comment: occasional   Drug use: No   Sexual activity: Yes    Birth control/protection: None  Other Topics Concern   Not on file  Social History Narrative   Not on file   Social Determinants of Health   Financial Resource Strain: Low Risk  (12/06/2022)   Overall Financial Resource Strain (CARDIA)    Difficulty of Paying Living Expenses: Not hard at all  Food Insecurity: No Food Insecurity (12/30/2022)   Hunger Vital Sign    Worried About Running Out of Food in the Last Year: Never true    Ran Out of Food in the Last Year: Never true  Transportation Needs: No Transportation Needs (12/30/2022)   PRAPARE - Administrator, Civil Service (Medical): No    Lack of Transportation (Non-Medical): No   Physical Activity: Insufficiently Active (12/06/2022)   Exercise Vital Sign    Days of Exercise per Week: 5 days    Minutes of Exercise per Session: 20 min  Stress: Stress Concern Present (12/06/2022)   [employer] of Occupational Health - Occupational Stress Questionnaire    Feeling of Stress : Very much  Social Connections: Moderately Isolated (12/06/2022)   Social Connection and Isolation Panel [NHANES]    Frequency of Communication with Friends and Family: More than three times a week    Frequency of Social Gatherings with Friends and Family: More than three times a week    Attends Religious Services: 1 to 4 times per year    Active Member of [employer] or Organizations: No    Attends Banker Meetings: Not on file    Marital Status: Never married  Intimate Partner Violence: Not At Risk (12/30/2022)   Humiliation, Afraid, Rape, and Kick questionnaire    Fear of Current or Ex-Partner: No    Emotionally Abused: No    Physically Abused: No    Sexually Abused: No    Review of Systems  All other systems reviewed and are negative.       Objective    BP 138/83   Pulse 94   Temp 97.7 F (36.5 C) (Oral)   Resp 16   Wt 275 lb 12.8 oz (125.1 kg)   LMP 12/27/2022   SpO2 98%   BMI 40.73 kg/m   Physical Exam Vitals and nursing note reviewed.  Constitutional:      General: She is not in acute distress. Cardiovascular:     Rate and Rhythm: Normal rate and regular rhythm.  Pulmonary:     Effort: Pulmonary effort is normal.     Breath sounds: Normal breath sounds.  Abdominal:     Palpations: Abdomen is soft.     Tenderness: There is no abdominal tenderness.  Neurological:     General: No focal deficit present.     Mental Status: She is alert and oriented to person, place, and time.         Assessment & Plan:   Screen for STD (sexually transmitted disease) -     Cervicovaginal ancillary only  Encounter for weight management  Class 3 severe obesity  due to excess calories with serious comorbidity and body mass index (BMI) of 40.0 to 44.9 in adult [facility])  Other orders -     Phentermine HCl; Take 1 tablet (37.5 mg total) by mouth daily before breakfast.  Dispense: 30 tablet; Refill: 0     Return in about 4 weeks (around 02/07/2023)  for follow up.   Tommie Raymond, MD

## 2023-01-12 NOTE — Progress Notes (Signed)
I made patient aware that she was positive for BV and candida. MD Andrey Campanile prescribe flagyl and diflucan.

## 2023-01-13 ENCOUNTER — Ambulatory Visit (INDEPENDENT_AMBULATORY_CARE_PROVIDER_SITE_OTHER): Payer: Medicaid Other | Admitting: Student

## 2023-01-13 DIAGNOSIS — F4312 Post-traumatic stress disorder, chronic: Secondary | ICD-10-CM

## 2023-01-13 DIAGNOSIS — F1090 Alcohol use, unspecified, uncomplicated: Secondary | ICD-10-CM | POA: Diagnosis not present

## 2023-01-13 DIAGNOSIS — F3160 Bipolar disorder, current episode mixed, unspecified: Secondary | ICD-10-CM

## 2023-01-13 DIAGNOSIS — R29818 Other symptoms and signs involving the nervous system: Secondary | ICD-10-CM

## 2023-01-13 DIAGNOSIS — D509 Iron deficiency anemia, unspecified: Secondary | ICD-10-CM

## 2023-01-13 DIAGNOSIS — E559 Vitamin D deficiency, unspecified: Secondary | ICD-10-CM | POA: Diagnosis not present

## 2023-01-13 MED ORDER — NALTREXONE HCL 50 MG PO TABS: 50.0000 mg | ORAL_TABLET | Freq: Every day | ORAL | 1 refills | Status: AC
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
## 2023-01-18 DIAGNOSIS — R29818 Other symptoms and signs involving the nervous system: Secondary | ICD-10-CM

## 2023-01-18 DIAGNOSIS — D509 Iron deficiency anemia, unspecified: Secondary | ICD-10-CM | POA: Insufficient documentation

## 2023-01-18 DIAGNOSIS — E559 Vitamin D deficiency, unspecified: Secondary | ICD-10-CM

## 2023-01-18 HISTORY — DX: Other symptoms and signs involving the nervous system: R29.818

## 2023-01-18 HISTORY — DX: Vitamin D deficiency, unspecified: E55.9

## 2023-01-18 HISTORY — DX: Iron deficiency anemia, unspecified: D50.9

## 2023-02-01 ENCOUNTER — Ambulatory Visit (HOSPITAL_COMMUNITY): Payer: Medicaid Other

## 2023-02-01 ENCOUNTER — Encounter (HOSPITAL_COMMUNITY): Payer: Self-pay

## 2023-02-01 ENCOUNTER — Telehealth (HOSPITAL_COMMUNITY): Payer: Self-pay

## 2023-02-01 NOTE — Telephone Encounter (Signed)
 Front desk messaged this therapist and said Fronie had called and said her baby was sick and she could not come in today. She asked to be rescheduled.  This therapist calls her and verifies her identity by obtaining two identifiers. Therapist asks Fronie if she can come in on February 08, 2023 at 1pm and she states she can.  Messaged front desk to ask she be scheduled at that time.  [name], MS, LMFT, LCAS 02-01-23

## 2023-02-08 ENCOUNTER — Other Ambulatory Visit (HOSPITAL_COMMUNITY)
Admission: RE | Admit: 2023-02-08 | Discharge: 2023-02-08 | Disposition: A | Discharge status: Home / Self Care | Payer: Medicaid Other | Source: Ambulatory Visit | Attending: Family Medicine | Admitting: Family Medicine

## 2023-02-08 ENCOUNTER — Ambulatory Visit (INDEPENDENT_AMBULATORY_CARE_PROVIDER_SITE_OTHER): Payer: Medicaid Other | Admitting: Family Medicine

## 2023-02-08 ENCOUNTER — Ambulatory Visit (INDEPENDENT_AMBULATORY_CARE_PROVIDER_SITE_OTHER): Payer: Medicaid Other

## 2023-02-08 VITALS — BP 134/89 | Temp 98.4°F | Resp 16 | Ht 69.0 in | Wt 269.4 lb

## 2023-02-08 DIAGNOSIS — Z7689 Persons encountering health services in other specified circumstances: Secondary | ICD-10-CM

## 2023-02-08 DIAGNOSIS — F1090 Alcohol use, unspecified, uncomplicated: Secondary | ICD-10-CM

## 2023-02-08 DIAGNOSIS — F4312 Post-traumatic stress disorder, chronic: Secondary | ICD-10-CM | POA: Diagnosis not present

## 2023-02-08 DIAGNOSIS — E6609 Other obesity due to excess calories: Secondary | ICD-10-CM | POA: Diagnosis not present

## 2023-02-08 DIAGNOSIS — Z6839 Body mass index (BMI) 39.0-39.9, adult: Secondary | ICD-10-CM | POA: Diagnosis not present

## 2023-02-08 DIAGNOSIS — Z113 Encounter for screening for infections with a predominantly sexual mode of transmission: Secondary | ICD-10-CM | POA: Diagnosis present

## 2023-02-08 DIAGNOSIS — E66812 Obesity, class 2: Secondary | ICD-10-CM | POA: Diagnosis not present

## 2023-02-08 DIAGNOSIS — F3160 Bipolar disorder, current episode mixed, unspecified: Secondary | ICD-10-CM

## 2023-02-08 MED ORDER — PHENTERMINE HCL 37.5 MG PO TABS: 37.5000 mg | ORAL_TABLET | Freq: Every day | ORAL | 0 refills | Status: DC

## 2023-02-08 NOTE — Progress Notes (Signed)
 THERAPIST PROGRESS NOTE  Session Time: 1:01 pm to 1:58  Type of Therapy: Individual   Therapist Response/Interventions: Psychoeducation regarding the importance of staying on medication when one has a recurrent mood disorder.  Treatment Goals addressed:Template: Depression (OP) Outpatient Treatment Plan 01/06/23 Effective from: 01/06/2023 Effective to: 07/07/2023 Plan ID: [id number] [X]  Aspirin  81 mg daily after 12 weeks Current antihypertensives: None Baseline and surveillance labs (pulled in from EPIC, refresh links as needed) Problem: OP Depression Dates: Start: 01/06/23 Disciplines: Interdisciplinary, PROVIDER Lab Results Component Value Date PLT 315 11/20/2018 CREATININE 0.62 11/20/2018 AST 38 11/20/2018 ALT 11 11/20/2018 PROTCRRATIO 11/20/2018 Goal: Katrina Stewart will report a reduction in her anxiety and mood sx as evidenced by her PHQ-9 and GAD- Page 1 of 2 Template: Depression (OP) (continued) Current Medications FLUoxetine  (PROZAC ) 20 MG capsule folic acid  (FOLVITE ) 1 MG tablet Iron , Ferrous Sulfate , 325 (65 Fe) MG TABS Multiple Vitamin (MULTIVITAMIN WITH MINERALS) TABS tablet ondansetron  (ZOFRAN ) 4 MG tablet phentermine  (ADIPEX-P ) 37.5 MG tablet polyethylene glycol (MIRALAX ) 17 g packet QUEtiapine  (SEROQUEL ) 300 MG tablet senna-docusate (SENOKOT-S) 8.6-50 MG tablet thiamine  (VITAMIN B-1) 100 MG tablet her GAD-7 as being less than a 4. Dates: Start: 01/06/23 Expected End: 07/07/23 Disciplines: Interdisciplinary, PROVIDER Goal: Katrina Stewart will abstain from alcohol daily per self-report per self-report and breathalyzer if indicated Dates: Start: 01/06/23 Expected End: 07/07/23 Disciplines: Interdisciplinary, PROVIDER Intervention: Therapist will assist Katrina Stewart in identifying and changing thourghts and behaviors that contribute to feelings or anxiety and unstable mood. Dates: Start: 01/06/23 Intervention: Therapist will educate Katrina Stewart about SUD's patterns and consequences of use, relapse  risks, the treatment process and types of mutual-support group and provide early recovery coping and relapse prevention skills. Dates: Start: 01/06/23 Description: Katrina Stewart provided verbal permission for this therapist to sign her Care Plan electronically  Summary: Katrina Stewart presents today for therapy.  She says she feels ok today. Katrina Stewart says she stopped taking her depression meds last week because she was feeling better and did not think she needed it any more. She states she still plans to come in to see Dr. Rainelle.  Katrina Stewart says she is not sure if she is going to ask her MD to stop prescribing her medications or if she will decide to restart them. Katrina Stewart says she has not been able to get the Naltrexone  because her insurance will not pay all of it.  Therapist inquires how she feels her trauma affects her at this point in there life. She says she has had different therapist since she was 53. Katrina Stewart says it has been about one year since she saw a therapist. She says she would tell her trauma story and then the therapist would leave and she would receive another therapist and the same thing would happen.   Katrina Stewart says she believes her trauma still affects does not feel comfortable around men. Katrina Stewart says she is also experiencing nightmares of the trauma. Therapist asks her to please talk to Dr. Rainelle about this as there is a medication that may help lessen the nightmares.   She agrees.    Progress Towards Goals: progressing  Suicidal/Homicidal: denies  Plan: Return again on 02-22-23  Diagnosis: Post Traumatic Stress Disorder Generalized Anxiety Disorder Alcohol Use Disorder, Severe, Dependence Bipols  Collaboration of Care: N/A  Patient/Guardian was advised Release of Information must be obtained prior to any record release in order to collaborate their care with an outside provider. Patient/Guardian was advised if they have not already done so to contact the registration department to sign all  necessary forms in  order for us  to release information regarding their care.   Consent: Patient/Guardian gives verbal consent for treatment and assignment of benefits for services provided during this visit. Patient/Guardian expressed understanding and agreed to proceed.   [name], LMFT, LCAS

## 2023-02-08 NOTE — Progress Notes (Signed)
 Established Patient Office Visit  Subjective    Patient ID: Katrina Stewart, female    DOB: [birth year]  Age: 28 y.o. MRN: [id number]  CC:  Chief Complaint  Patient presents with   Follow-up    4 week, STD testing    HPI Katrina Stewart presents for routine weight management. Patient continues to do well with present management. Patient would also like STD screen. She denies GU sx.   Outpatient Encounter Medications as of 02/08/2023  Medication Sig   FLUoxetine  (PROZAC ) 20 MG capsule Take 3 capsules (60 mg total) by mouth daily.   folic acid  (FOLVITE ) 1 MG tablet Take 1 tablet (1 mg total) by mouth daily.   Iron , Ferrous Sulfate , 325 (65 Fe) MG TABS Take 325 mg by mouth 2 (two) times daily.   Multiple Vitamin (MULTIVITAMIN WITH MINERALS) TABS tablet Take 1 tablet by mouth daily.   naltrexone  (DEPADE) 50 MG tablet Take 1 tablet (50 mg total) by mouth daily.   thiamine  (VITAMIN B-1) 100 MG tablet Take 1 tablet (100 mg total) by mouth daily.   [DISCONTINUED] phentermine  (ADIPEX-P ) 37.5 MG tablet Take 1 tablet (37.5 mg total) by mouth daily before breakfast.   ondansetron  (ZOFRAN ) 4 MG tablet Take 1 tablet (4 mg total) by mouth every 6 (six) hours. (Patient not taking: Reported on 01/13/2023)   phentermine  (ADIPEX-P ) 37.5 MG tablet Take 1 tablet (37.5 mg total) by mouth daily before breakfast.   polyethylene glycol (MIRALAX ) 17 g packet Take 17 g by mouth daily. (Patient not taking: Reported on 01/13/2023)   QUEtiapine  (SEROQUEL ) 300 MG tablet Take 1 tablet (300 mg total) by mouth at bedtime.   senna-docusate (SENOKOT-S) 8.6-50 MG tablet Take 1 tablet by mouth 2 (two) times daily. (Patient not taking: Reported on 01/13/2023)   No facility-administered encounter medications on file as of 02/08/2023.    Past Medical History:  Diagnosis Date   Anxiety    COVID-19 virus infection 12/05/2018   + again on 2/4 (asymptomatic)  postiive test in [facility] on 11-2   Depression    Depression     Phreesia 12/17/2019   Depression    Phreesia 12/30/2019   Essential hypertension    Gestational diabetes mellitus (GDM)    Normal postpartum 2 hr GTT   Group B streptococcal infection in pregnancy 03/13/2019   Rh negative state in antepartum period 09/03/2015   [x]  needs rhogam at 28 weeks  [name] postive anti-D on initial lab. Patient received rhogam on 08/02/18 during [facility] visit   Rubella non-immune status, antepartum 09/03/2015   Vaginal Pap smear, abnormal     Past Surgical History:  Procedure Laterality Date   BREAST SURGERY  2013   Lt & Rt excision juvenile fibroadenoma   MASS EXCISION  10/29/2011   Procedure: EXCISION MASS;  Surgeon: Vicenta DELENA Poli, MD;  Location: [facility];  Service: General;  Laterality: Bilateral;  excision of bilateral breast masses    Family History  Problem Relation Age of Onset   Cancer Maternal Grandmother        lung cancer   Cancer Other        bladder cancer   Asthma Brother     Social History   Socioeconomic History   Marital status: Single    Spouse name: Not on file   Number of children: Not on file   Years of education: Not on file   Highest education level: Some college, no degree  Occupational History    Comment: unemployed  Tobacco Use   Smoking status: Never    Passive exposure: Never   Smokeless tobacco: Never  Vaping Use   Vaping status: Never Used  Substance and Sexual Activity   Alcohol use: Yes    Alcohol/week: 2.0 - 3.0 standard drinks of alcohol    Types: 2 - 3 Shots of liquor per week    Comment: occasional   Drug use: No   Sexual activity: Yes    Birth control/protection: None  Other Topics Concern   Not on file  Social History Narrative   Not on file   Social Drivers of Health   Financial Resource Strain: Low Risk  (02/08/2023)   Overall Financial Resource Strain (CARDIA)    Difficulty of Paying Living Expenses: Not hard at all  Food Insecurity: No Food Insecurity (02/08/2023)   Hunger Vital Sign    Worried  About Running Out of Food in the Last Year: Never true    Ran Out of Food in the Last Year: Never true  Transportation Needs: No Transportation Needs (02/08/2023)   PRAPARE - Administrator, Civil Service (Medical): No    Lack of Transportation (Non-Medical): No  Physical Activity: Insufficiently Active (02/08/2023)   Exercise Vital Sign    Days of Exercise per Week: 5 days    Minutes of Exercise per Session: 20 min  Stress: Stress Concern Present (02/08/2023)   [employer] of Occupational Health - Occupational Stress Questionnaire    Feeling of Stress : Very much  Social Connections: Moderately Isolated (02/08/2023)   Social Connection and Isolation Panel [NHANES]    Frequency of Communication with Friends and Family: More than three times a week    Frequency of Social Gatherings with Friends and Family: More than three times a week    Attends Religious Services: 1 to 4 times per year    Active Member of [employer] or Organizations: No    Attends Banker Meetings: Not on file    Marital Status: Never married  Intimate Partner Violence: Not At Risk (12/30/2022)   Humiliation, Afraid, Rape, and Kick questionnaire    Fear of Current or Ex-Partner: No    Emotionally Abused: No    Physically Abused: No    Sexually Abused: No    Review of Systems  All other systems reviewed and are negative.       Objective    BP 134/89 (BP Location: Right Arm, Patient Position: Sitting, Cuff Size: Large)   Temp 98.4 F (36.9 C) (Oral)   Resp 16   Ht 5' 9 (1.753 m)   Wt 269 lb 6.4 oz (122.2 kg)   SpO2 98%   BMI 39.78 kg/m   Physical Exam Vitals and nursing note reviewed.  Constitutional:      General: She is not in acute distress.    Appearance: She is obese.  Cardiovascular:     Rate and Rhythm: Normal rate and regular rhythm.  Pulmonary:     Effort: Pulmonary effort is normal.     Breath sounds: Normal breath sounds.  Abdominal:     Palpations: Abdomen is  soft.     Tenderness: There is no abdominal tenderness.  Neurological:     General: No focal deficit present.     Mental Status: She is alert and oriented to person, place, and time.         Assessment & Plan:   1. Encounter for weight management (Primary) Doing well. Phentermine  refilled.   2.  Class 2 obesity due to excess calories without serious comorbidity with body mass index (BMI) of 39.0 to 39.9 in adult   3. Screen for STD (sexually transmitted disease)  - Cervicovaginal ancillary only  Return in about 4 weeks (around 03/08/2023) for follow up.   Tanda Raguel SQUIBB, MD

## 2023-02-09 ENCOUNTER — Encounter: Payer: Self-pay | Admitting: Family Medicine

## 2023-02-09 LAB — CERVICOVAGINAL ANCILLARY ONLY
Bacterial Vaginitis (gardnerella): POSITIVE — AB
Candida Glabrata: NEGATIVE
Candida Vaginitis: NEGATIVE
Chlamydia: NEGATIVE
Comment: NEGATIVE
Comment: NEGATIVE
Comment: NEGATIVE
Comment: NEGATIVE
Comment: NEGATIVE
Comment: NORMAL
Neisseria Gonorrhea: NEGATIVE
Trichomonas: NEGATIVE

## 2023-02-10 ENCOUNTER — Other Ambulatory Visit: Payer: Self-pay | Admitting: Family Medicine

## 2023-02-10 MED ORDER — METRONIDAZOLE 500 MG PO TABS: 500.0000 mg | ORAL_TABLET | Freq: Two times a day (BID) | ORAL | 0 refills | Status: AC

## 2023-02-17 ENCOUNTER — Encounter (HOSPITAL_COMMUNITY): Payer: Self-pay

## 2023-02-17 ENCOUNTER — Encounter (HOSPITAL_COMMUNITY): Payer: Medicaid Other | Admitting: Student

## 2023-02-17 ENCOUNTER — Ambulatory Visit (INDEPENDENT_AMBULATORY_CARE_PROVIDER_SITE_OTHER): Payer: Medicaid Other | Admitting: Student

## 2023-02-17 ENCOUNTER — Other Ambulatory Visit (HOSPITAL_COMMUNITY): Payer: Self-pay

## 2023-02-17 DIAGNOSIS — F4312 Post-traumatic stress disorder, chronic: Secondary | ICD-10-CM

## 2023-02-17 DIAGNOSIS — D508 Other iron deficiency anemias: Secondary | ICD-10-CM | POA: Diagnosis not present

## 2023-02-17 DIAGNOSIS — F1090 Alcohol use, unspecified, uncomplicated: Secondary | ICD-10-CM | POA: Diagnosis not present

## 2023-02-17 MED ORDER — FLUOXETINE HCL 40 MG PO CAPS
40.0000 mg | ORAL_CAPSULE | Freq: Every day | ORAL | 1 refills | Status: DC
  Filled 2023-02-17: qty 30, 30d supply, fill #0

## 2023-02-17 MED ORDER — VITAMIN B-1 100 MG PO TABS
100.0000 mg | ORAL_TABLET | Freq: Every day | ORAL | 5 refills | Status: DC
  Filled 2023-02-17: qty 100, 100d supply, fill #0

## 2023-02-17 MED ORDER — QUETIAPINE FUMARATE 200 MG PO TABS
200.0000 mg | ORAL_TABLET | Freq: Every day | ORAL | 1 refills | Status: DC
  Filled 2023-02-17: qty 30, 30d supply, fill #0
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
## 2023-02-18 ENCOUNTER — Other Ambulatory Visit: Payer: Self-pay

## 2023-02-22 ENCOUNTER — Encounter (HOSPITAL_COMMUNITY): Payer: Self-pay

## 2023-02-22 ENCOUNTER — Ambulatory Visit: Payer: Medicaid Other | Admitting: Family Medicine

## 2023-02-22 ENCOUNTER — Ambulatory Visit (HOSPITAL_COMMUNITY): Payer: Medicaid Other

## 2023-03-01 ENCOUNTER — Other Ambulatory Visit (HOSPITAL_COMMUNITY): Payer: Self-pay

## 2023-03-01 ENCOUNTER — Ambulatory Visit (INDEPENDENT_AMBULATORY_CARE_PROVIDER_SITE_OTHER): Payer: Medicaid Other

## 2023-03-01 DIAGNOSIS — F411 Generalized anxiety disorder: Secondary | ICD-10-CM | POA: Diagnosis not present

## 2023-03-01 DIAGNOSIS — F1090 Alcohol use, unspecified, uncomplicated: Secondary | ICD-10-CM | POA: Diagnosis not present

## 2023-03-01 DIAGNOSIS — F3160 Bipolar disorder, current episode mixed, unspecified: Secondary | ICD-10-CM

## 2023-03-01 NOTE — Progress Notes (Signed)
THERAPIST PROGRESS NOTE  Session Time: 1:55 pm to 1:38 pm  Virtual Visit via Video Note   I connected with [name] Finnigan  at 1:55 pm  EST by a video enabled telemedicine application and verified that I am speaking with the correct person using two identifiers.   Location: Patient: home Provider: [address] [place]   I discussed the limitations of evaluation and management by telemedicine and the availability of in person appointments. The patient expressed understanding and agreed to proceed.   Type of Therapy: Individual   Therapist Response/Interventions: Psychoeducation regarding the importance of staying on medication; CBT, discussed the difficulty of abstaining when one is having cravings, discussed addiction as a brain disease and the difficulty of processing information or make decisions when someone continues to drink/motivational interviewing   Treatment Goals addressed:Template: Depression (OP) Outpatient Treatment Plan 01/06/23  Problem: OP Depression Dates: Start: 01/06/23 Disciplines: Interdisciplinary, PROVIDER Lab Results Component Value Date PLT 315 11/20/2018 CREATININE 0.62 11/20/2018 AST 38 11/20/2018 ALT 11 11/20/2018 PROTCRRATIO 11/20/2018 Goal: [name] will report a reduction in her anxiety and mood sx as evidenced by her PHQ-9 and GAD- Page 1 of 2 Template: Depression (OP) (continued) Current Medications FLUoxetine (PROZAC) 20 MG capsule folic acid (FOLVITE) 1 MG tablet Iron, Ferrous Sulfate, 325 (65 Fe) MG TABS Multiple Vitamin (MULTIVITAMIN WITH MINERALS) TABS tablet ondansetron (ZOFRAN) 4 MG tablet phentermine (ADIPEX-P) 37.5 MG tablet polyethylene glycol (MIRALAX) 17 g packet QUEtiapine (SEROQUEL) 300 MG tablet senna-docusate (SENOKOT-S) 8.6-50 MG tablet thiamine (VITAMIN B-1) 100 MG tablet her GAD-7 as being less than a 4. Dates: Start: 01/06/23 Expected End: 07/07/23 Disciplines: Interdisciplinary, PROVIDER Goal: [name] will abstain from  alcohol daily per self-report per self-report and breathalyzer if indicated Dates: Start: 01/06/23 Expected End: 07/07/23 Disciplines: Interdisciplinary, PROVIDER Intervention: Therapist will assist [name] in identifying and changing thourghts and behaviors that contribute to feelings or anxiety and unstable mood. Dates: Start: 01/06/23 Intervention: Therapist will educate [name] about SUD's patterns and consequences of use, relapse risks, the treatment process and types of mutual-support group and provide early recovery coping and relapse prevention skills. Dates: Start: 01/06/23 Description: [name] provided verbal permission for this therapist to sign her Care Plan electronically  Summary: [name] presents today for therapy.  She is cuddled up with blankets on her furniture. [name] says she drinks form 1/5 to 2/5 on Friday and again on Saturday nights.  [name] is not talkative today but will respond to questions. When therapist asked what she thought it may take for her to move from a position of drinking this amount of alcohol to decreasing or stopping, she says she is not sure.  Therapist asked if she has gotten the Naltrexone that Dr. Alfonse Flavors prescribed and [facility] says Dr. Alfonse Flavors suggests she go to another pharmacy since the pharmacy she went to said her medicaid would not pay for the cost of this medication.  Therapist asked if she had any plans to go and she said, "at some time".  Therapist asked what might be getting in her way and she said, she did not feel like it. She is unsure what would make her feel better so that she might make more movement in that direction.  Therapist did ask at what age she started drinking and she said it would at age 32. Therapist asks if there is any family hx of addiction and she says her parents did not suffer from addiction but there was a paternal and maternal history of addiction in her family.  Therapist asks [name] if she was suffering depressive symptoms before she started  drinking and she said that she did. Therapist asked about her pattern of drinking and she says she started out drinking a little wine and then it built up to drinking hard liquor.      Progress Towards Goals: no progress  Suicidal/Homicidal: denies  Plan: Return again on 2.11.25 at 1:00 pm   Diagnosis: Post Traumatic Stress Disorder Generalized Anxiety Disorder Alcohol Use Disorder, Severe, Dependence Bipolar Affective Disorder  Collaboration of Care: N/A  Patient/Guardian was advised Release of Information must be obtained prior to any record release in order to collaborate their care with an outside provider. Patient/Guardian was advised if they have not already done so to contact the registration department to sign all necessary forms in order for [place] to release information regarding their care.   Consent: Patient/Guardian gives verbal consent for treatment and assignment of benefits for services provided during this visit. Patient/Guardian expressed understanding and agreed to proceed.   Remigio Eisenmenger, LMFT, LCAS

## 2023-03-02 ENCOUNTER — Other Ambulatory Visit (HOSPITAL_COMMUNITY): Payer: Self-pay

## 2023-03-08 ENCOUNTER — Ambulatory Visit: Payer: Medicaid Other | Admitting: Family Medicine

## 2023-03-15 ENCOUNTER — Ambulatory Visit (HOSPITAL_COMMUNITY): Payer: Medicaid Other

## 2023-03-15 DIAGNOSIS — F1090 Alcohol use, unspecified, uncomplicated: Secondary | ICD-10-CM

## 2023-03-15 DIAGNOSIS — F3160 Bipolar disorder, current episode mixed, unspecified: Secondary | ICD-10-CM

## 2023-03-15 DIAGNOSIS — F411 Generalized anxiety disorder: Secondary | ICD-10-CM

## 2023-03-15 DIAGNOSIS — F4312 Post-traumatic stress disorder, chronic: Secondary | ICD-10-CM

## 2023-03-15 NOTE — Progress Notes (Signed)
THERAPIST PROGRESS NOTE  Session Time: 1:00pm to 1:45 pm  Virtual Visit via Video Note   I connected with Katrina Stewart  at 1:55 pm  EST by a video enabled telemedicine application and verified that I am speaking with the correct person using two identifiers.   Location: Patient: car in the [employer] Provider: [address] [place]   I discussed the limitations of evaluation and management by telemedicine and the availability of in person appointments. The patient expressed understanding and agreed to proceed.   Type of Therapy: Individual   Therapist Response/Interventions: Psychoeducation regarding the importance of staying on medication; CBT, discussed viewing craving as waves, processed an alternative way to deal with frustration to deal with the ritual of using  and that being counting to 10 multiple times, again discussed the difficulty of abstaining when one is having cravings, and use the metaphor of looking at cravings as waves which if one rides out will subside, motivational interviewing  Treatment Goals addressed:Template: Depression (OP) Outpatient Treatment Plan 01/06/23  Problem: OP Depression Dates: Start: 01/06/23 Disciplines: Interdisciplinary, PROVIDER Lab Results Component Value Date PLT 315 11/20/2018 CREATININE 0.62 11/20/2018 AST 38 11/20/2018 ALT 11 11/20/2018 PROTCRRATIO 11/20/2018 Goal: Katrina will report a reduction in her anxiety and mood sx as evidenced by her PHQ-9 and GAD- Page 1 of 2 Template: Depression (OP) (continued) Current Medications FLUoxetine (PROZAC) 20 MG capsule folic acid (FOLVITE) 1 MG tablet Iron, Ferrous Sulfate, 325 (65 Fe) MG TABS Multiple Vitamin (MULTIVITAMIN WITH MINERALS) TABS tablet ondansetron (ZOFRAN) 4 MG tablet phentermine (ADIPEX-P) 37.5 MG tablet polyethylene glycol (MIRALAX) 17 g packet QUEtiapine (SEROQUEL) 300 MG tablet senna-docusate (SENOKOT-S) 8.6-50 MG tablet thiamine (VITAMIN B-1) 100 MG  tablet her GAD-7 as being less than a 4. Dates: Start: 01/06/23 Expected End: 07/07/23 Disciplines: Interdisciplinary, PROVIDER Goal: Katrina will abstain from alcohol daily per self-report per self-report and breathalyzer if indicated Dates: Start: 01/06/23 Expected End: 07/07/23 Disciplines: Interdisciplinary, PROVIDER Intervention: Therapist will assist Katrina in identifying and changing thourghts and behaviors that contribute to feelings or anxiety and unstable mood. Dates: Start: 01/06/23 Intervention: Therapist will educate Katrina about SUD's patterns and consequences of use, relapse risks, the treatment process and types of mutual-support group and provide early recovery coping and relapse prevention skills. Dates: Start: 01/06/23 Description: Katrina Stewart provided verbal permission for this therapist to sign her Care Plan electronically  Summary: Katrina Stewart presents today for virtual therapy.  Therapists notices [employer] signs above her head and asked if she is in the store. Therapist can hear and see other's around her. Therapist explains she cannot continue the session with Katrina in a public setting as confidential and privacy cannot be honored. She says she will go get in her car and log back in.  Katrina does this and reports no one else is in the car or walking around her.  Therapist cannot see anyone around her.  Therapist asks what she would like to discuss today. She says she does not know. Katrina reports she drinks 2-3 shots per day and used about 1/5 over the weekend.  Katrina reports the reason for using is that everyone frustrates her so she has to drink to deal with those emotions. Therapist discusses their are other ways to deal with emotions other than using substances. Katrina does not provide any response.  Therapist inquires how much past trauma may be affecting her drinking. Katrina says ocasionally it comes up and I drink to make it go away. Therapist explains there  are other ways to deal with trauma,  other than using substances.  Therapist asks Katrina Stewart if quitting substances is her goals and she said it is not. Therapist asks what her goals is and she says she would like to cut down about a pint each weekend and not drink during the week.  Therapist uses motivational interviewing to help move Katrina closer to her goal. Katrina Stewart says there's nothing that would help her with the fact everyone frustrates her. Therapist suggest counting from 1 to 10 when someone asks what she is doing to reply, "counting" and if they ask why to simple say, "I like counting".  Katrina says she may can try that.  Therapist has concerns about Katrina's clarity of thought and ability to remember when she is in active use.      Progress Towards Goals: no progress  Suicidal/Homicidal: denies  Plan: Return again virtually on 03-31-23 at 10am  Diagnosis: Post Traumatic Stress Disorder Generalized Anxiety Disorder Alcohol Use Disorder, Severe, Dependence Bipolar Affective Disorder  Collaboration of Care: N/A  Patient/Guardian was advised Release of Information must be obtained prior to any record release in order to collaborate their care with an outside provider. Patient/Guardian was advised if they have not already done so to contact the registration department to sign all necessary forms in order for [place] to release information regarding their care.   Consent: Patient/Guardian gives verbal consent for treatment and assignment of benefits for services provided during this visit. Patient/Guardian expressed understanding and agreed to proceed.   Remigio Eisenmenger, LMFT, LCAS

## 2023-03-17 ENCOUNTER — Encounter (HOSPITAL_COMMUNITY): Payer: Medicaid Other | Admitting: Student

## 2023-03-23 ENCOUNTER — Ambulatory Visit: Payer: Medicaid Other | Admitting: Family Medicine

## 2023-03-30 ENCOUNTER — Inpatient Hospital Stay (HOSPITAL_COMMUNITY): Payer: Medicaid Other

## 2023-03-30 ENCOUNTER — Inpatient Hospital Stay (HOSPITAL_COMMUNITY)
Admission: AD | Admit: 2023-03-30 | Discharge: 2023-03-30 | Disposition: A | Discharge status: Home / Self Care | Payer: Medicaid Other | Attending: Obstetrics & Gynecology | Admitting: Obstetrics & Gynecology

## 2023-03-30 ENCOUNTER — Encounter (HOSPITAL_COMMUNITY): Payer: Self-pay | Admitting: Obstetrics & Gynecology

## 2023-03-30 ENCOUNTER — Encounter (HOSPITAL_COMMUNITY): Payer: Medicaid Other | Admitting: Student

## 2023-03-30 DIAGNOSIS — O26891 Other specified pregnancy related conditions, first trimester: Secondary | ICD-10-CM | POA: Diagnosis not present

## 2023-03-30 DIAGNOSIS — Z3A01 Less than 8 weeks gestation of pregnancy: Secondary | ICD-10-CM | POA: Insufficient documentation

## 2023-03-30 DIAGNOSIS — R519 Headache, unspecified: Secondary | ICD-10-CM | POA: Diagnosis not present

## 2023-03-30 DIAGNOSIS — O209 Hemorrhage in early pregnancy, unspecified: Secondary | ICD-10-CM | POA: Diagnosis present

## 2023-03-30 DIAGNOSIS — Z6791 Unspecified blood type, Rh negative: Secondary | ICD-10-CM

## 2023-03-30 DIAGNOSIS — O200 Threatened abortion: Secondary | ICD-10-CM | POA: Insufficient documentation

## 2023-03-30 LAB — COMPREHENSIVE METABOLIC PANEL
ALT: 13 U/L (ref 0–44)
AST: 16 U/L (ref 15–41)
Albumin: 3.6 g/dL (ref 3.5–5.0)
Alkaline Phosphatase: 52 U/L (ref 38–126)
Anion gap: 9 (ref 5–15)
BUN: 8 mg/dL (ref 6–20)
CO2: 22 mmol/L (ref 22–32)
Calcium: 9.3 mg/dL (ref 8.9–10.3)
Chloride: 108 mmol/L (ref 98–111)
Creatinine, Ser: 0.84 mg/dL (ref 0.44–1.00)
GFR, Estimated: >60 mL/min (ref >60)
Glucose, Bld: 107 mg/dL — ABNORMAL HIGH (ref 70–99)
Potassium: 3.4 mmol/L — ABNORMAL LOW (ref 3.5–5.1)
Sodium: 139 mmol/L (ref 135–145)
Total Bilirubin: 0.3 mg/dL (ref 0.0–1.2)
Total Protein: 6.8 g/dL (ref 6.5–8.1)

## 2023-03-30 LAB — HCG, QUANTITATIVE, PREGNANCY: hCG, Beta Chain, Quant, S: 4737 m[IU]/mL — ABNORMAL HIGH (ref <5)

## 2023-03-30 LAB — URINALYSIS, ROUTINE W REFLEX MICROSCOPIC
Bilirubin Urine: NEGATIVE
Glucose, UA: NEGATIVE mg/dL
Hgb urine dipstick: NEGATIVE
Ketones, ur: NEGATIVE mg/dL
Leukocytes,Ua: NEGATIVE
Nitrite: NEGATIVE
Protein, ur: NEGATIVE mg/dL
Specific Gravity, Urine: 1.027 (ref 1.005–1.030)
pH: 5 (ref 5.0–8.0)

## 2023-03-30 LAB — CBC
HCT: 33.6 % — ABNORMAL LOW (ref 36.0–46.0)
Hemoglobin: 10.6 g/dL — ABNORMAL LOW (ref 12.0–15.0)
MCH: 22.2 pg — ABNORMAL LOW (ref 26.0–34.0)
MCHC: 31.5 g/dL (ref 30.0–36.0)
MCV: 70.3 fL — ABNORMAL LOW (ref 80.0–100.0)
Platelets: 353 10*3/uL (ref 150–400)
RBC: 4.78 MIL/uL (ref 3.87–5.11)
RDW: 17.6 % — ABNORMAL HIGH (ref 11.5–15.5)
WBC: 7.1 10*3/uL (ref 4.0–10.5)
nRBC: 0 % (ref 0.0–0.2)

## 2023-03-30 LAB — TYPE AND SCREEN
ABO/RH(D): B NEG
Antibody Screen: NEGATIVE

## 2023-03-30 LAB — POCT PREGNANCY, URINE: Preg Test, Ur: POSITIVE — AB

## 2023-03-30 MED ORDER — RHO D IMMUNE GLOBULIN 1500 UNIT/2ML IJ SOSY
300.0000 ug | PREFILLED_SYRINGE | Freq: Once | INTRAMUSCULAR | Status: AC
  Administered 2023-03-30: 300 ug via INTRAMUSCULAR
  Filled 2023-03-30: qty 2

## 2023-03-30 MED ORDER — CYCLOBENZAPRINE HCL 5 MG PO TABS
10.0000 mg | ORAL_TABLET | Freq: Once | ORAL | Status: AC
  Administered 2023-03-30: 10 mg via ORAL
  Filled 2023-03-30: qty 2

## 2023-03-30 MED ORDER — ONDANSETRON 4 MG PO TBDP: 4.0000 mg | ORAL_TABLET | Freq: Three times a day (TID) | ORAL | 2 refills | Status: DC | PRN

## 2023-03-30 NOTE — MAU Note (Signed)
.  Katrina Stewart is a 28 y.o. at Unknown here in MAU reporting: abd pain for 2 days and started spotting today. Also reports having a headache x 4 days that she has tried to treat with tylenol but has not helped. Has had N/V as well.   LMP: 02/22/23 Onset of complaint: 2-4 days Pain score: 8-9 Vitals:   03/30/23 1823  BP: (!) 147/89  Pulse: (!) 111  Resp: 18  Temp: 99 F (37.2 C)     FHT: n/a  Lab orders placed from triage: UPT, U/A

## 2023-03-30 NOTE — MAU Provider Note (Signed)
 [facility] Attending MAU Note  Chief Complaint: Abdominal Pain and Headache    None     SUBJECTIVE Katrina Stewart is a 28 y.o. [id number] at [redacted]w[redacted]d by LMP who presents with spotting and cramping in early pregnancy.LMP 01/21. Has some h/a. H/o N/V in pregnancies in the past.  Past Medical History:  Diagnosis Date   Anxiety    COVID-19 virus infection 12/05/2018   + again on 2/4 (asymptomatic)  postiive test in MAU on 11-2   Depression    Depression    Phreesia 12/17/2019   Depression    Phreesia 12/30/2019   Essential hypertension    Gestational diabetes mellitus (GDM)    Normal postpartum 2 hr GTT   Group B streptococcal infection in pregnancy 03/13/2019   Rh negative state in antepartum period 09/03/2015   [x]  needs rhogam at 28 weeks  [name] postive anti-D on initial lab. Patient received rhogam on 08/02/18 during MAU visit   Rubella non-immune status, antepartum 09/03/2015   Vaginal Pap smear, abnormal    OB History  Gravida Para Term Preterm AB Living  4 2 2  1 2   SAB IAB Ectopic Multiple Live Births  1 0  0 2    # Outcome Date GA Lbr Len/2nd Weight Sex Type Anes PTL Lv  4 Current           3 Term 03/24/19 [redacted]w[redacted]d 00:40 / 00:13 2815 g M Vag-Spont EPI  LIV  2 Term 02/22/16 [redacted]w[redacted]d 24:20 / 00:26 3440 g M Vag-Spont EPI  LIV  1 SAB            Past Surgical History:  Procedure Laterality Date   BREAST SURGERY  2013   Lt & Rt excision juvenile fibroadenoma   MASS EXCISION  10/29/2011   Procedure: EXCISION MASS;  Surgeon: Shelly Rubenstein, MD;  Location: [facility];  Service: General;  Laterality: Bilateral;  excision of bilateral breast masses   Social History   Socioeconomic History   Marital status: Single    Spouse name: Not on file   Number of children: Not on file   Years of education: Not on file   Highest education level: Some college, no degree  Occupational History    Comment: unemployed  Tobacco Use   Smoking status: Never    Passive exposure: Never    Smokeless tobacco: Never  Vaping Use   Vaping status: Never Used  Substance and Sexual Activity   Alcohol use: Yes    Alcohol/week: 2.0 - 3.0 standard drinks of alcohol    Types: 2 - 3 Shots of liquor per week    Comment: occasional   Drug use: No   Sexual activity: Yes    Birth control/protection: None  Other Topics Concern   Not on file  Social History Narrative   Not on file   Social Drivers of Health   Financial Resource Strain: Low Risk  (02/08/2023)   Overall Financial Resource Strain (CARDIA)    Difficulty of Paying Living Expenses: Not hard at all  Food Insecurity: No Food Insecurity (02/08/2023)   Hunger Vital Sign    Worried About Running Out of Food in the Last Year: Never true    Ran Out of Food in the Last Year: Never true  Transportation Needs: No Transportation Needs (02/08/2023)   PRAPARE - Administrator, Civil Service (Medical): No    Lack of Transportation (Non-Medical): No  Physical Activity: Insufficiently Active (02/08/2023)   Exercise  Vital Sign    Days of Exercise per Week: 5 days    Minutes of Exercise per Session: 20 min  Stress: Stress Concern Present (02/08/2023)   [employer] of Occupational Health - Occupational Stress Questionnaire    Feeling of Stress : Very much  Social Connections: Moderately Isolated (02/08/2023)   Social Connection and Isolation Panel [NHANES]    Frequency of Communication with Friends and Family: More than three times a week    Frequency of Social Gatherings with Friends and Family: More than three times a week    Attends Religious Services: 1 to 4 times per year    Active Member of [employer] or Organizations: No    Attends Banker Meetings: Not on file    Marital Status: Never married  Intimate Partner Violence: Not At Risk (12/30/2022)   Humiliation, Afraid, Rape, and Kick questionnaire    Fear of Current or Ex-Partner: No    Emotionally Abused: No    Physically Abused: No    Sexually Abused: No    No current facility-administered medications on file prior to encounter.   Current Outpatient Medications on File Prior to Encounter  Medication Sig Dispense Refill   FLUoxetine (PROZAC) 40 MG capsule Take 1 capsule (40 mg total) by mouth daily. 30 capsule 1   folic acid (FOLVITE) 1 MG tablet Take 1 tablet (1 mg total) by mouth daily. (Patient not taking: Reported on 02/17/2023) 30 tablet 0   Iron, Ferrous Sulfate, 325 (65 Fe) MG TABS Take 325 mg by mouth 2 (two) times daily. (Patient not taking: Reported on 02/17/2023) 60 tablet 3   Multiple Vitamin (MULTIVITAMIN WITH MINERALS) TABS tablet Take 1 tablet by mouth daily. (Patient not taking: Reported on 02/17/2023) 30 tablet 0   ondansetron (ZOFRAN) 4 MG tablet Take 1 tablet (4 mg total) by mouth every 6 (six) hours. (Patient not taking: Reported on 01/13/2023) 12 tablet 0   phentermine (ADIPEX-P) 37.5 MG tablet Take 1 tablet (37.5 mg total) by mouth daily before breakfast. 30 tablet 0   polyethylene glycol (MIRALAX) 17 g packet Take 17 g by mouth daily. (Patient not taking: Reported on 01/13/2023) 14 each 0   QUEtiapine (SEROQUEL) 200 MG tablet Take 1 tablet (200 mg total) by mouth at bedtime. 30 tablet 1   senna-docusate (SENOKOT-S) 8.6-50 MG tablet Take 1 tablet by mouth 2 (two) times daily. (Patient not taking: Reported on 01/13/2023) 30 tablet 0   thiamine (VITAMIN B-1) 100 MG tablet Take 1 tablet (100 mg total) by mouth daily. 30 tablet 5   No Known Allergies  ROS: Pertinent items in HPI  OBJECTIVE BP (!) 147/89   Pulse (!) 111   Temp 99 F (37.2 C)   Resp 18   Ht 5\' 9"  (1.753 m)   Wt 126.6 kg   LMP 02/22/2023   BMI 41.20 kg/m  CONSTITUTIONAL: Well-developed, well-nourished female in no acute distress.  HENT:  Normocephalic, atraumatic, External right and left ear normal.  EYES: Conjunctivae and EOM are normal.  No scleral icterus.  NECK: Normal range of motion, supple, no masses.   SKIN: Skin is warm and dry. No rash noted.  Not diaphoretic. No erythema. No pallor. NEUROLGIC: Alert and oriented to person, place, and time.  CARDIOVASCULAR: Normal heart rate noted RESPIRATORY: [place] and breath sounds normal, no problems with respiration noted. ABDOMEN: Soft, normal bowel sounds, no distention noted.  No tenderness, rebound or guarding.  MUSCULOSKELETAL: Normal range of motion. No tenderness.  No  cyanosis, clubbing, or edema.  2+ distal pulses.  LAB RESULTS Results for orders placed or performed during the hospital encounter of 03/30/23 (from the past 48 hours)  Pregnancy, urine POC     Status: Abnormal   Collection Time: 03/30/23  6:23 PM  Result Value Ref Range   Preg Test, Ur POSITIVE (A) NEGATIVE    Comment:        THE SENSITIVITY OF THIS METHODOLOGY IS >24 mIU/mL   Urinalysis, Routine w reflex microscopic -Urine, Clean Catch     Status: None   Collection Time: 03/30/23  6:27 PM  Result Value Ref Range   Color, Urine YELLOW YELLOW   APPearance CLEAR CLEAR   Specific Gravity, Urine 1.027 1.005 - 1.030   pH 5.0 5.0 - 8.0   Glucose, UA NEGATIVE NEGATIVE mg/dL   Hgb urine dipstick NEGATIVE NEGATIVE   Bilirubin Urine NEGATIVE NEGATIVE   Ketones, ur NEGATIVE NEGATIVE mg/dL   Protein, ur NEGATIVE NEGATIVE mg/dL   Nitrite NEGATIVE NEGATIVE   Leukocytes,Ua NEGATIVE NEGATIVE    Comment: Performed at [facility], [address] [place], [place] [address]  Type and screen [facility]     Status: None   Collection Time: 03/30/23  6:54 PM  Result Value Ref Range   ABO/RH(D) B NEG    Antibody Screen NEG    Sample Expiration      04/02/2023,2359 Performed at [facility], [address], [place], [place] [address]   hCG, quantitative, pregnancy     Status: Abnormal   Collection Time: 03/30/23  6:56 PM  Result Value Ref Range   hCG, Beta Chain, Quant, S 4,737 (H) <5 mIU/mL    Comment:          GEST. AGE      CONC.  (mIU/mL)   <=1 WEEK        5 - 50     2 WEEKS       50 -  500     3 WEEKS       100 - 10,000     4 WEEKS     1,000 - 30,000     5 WEEKS     3,500 - 115,000   6-8 WEEKS     12,000 - 270,000    12 WEEKS     15,000 - 220,000        FEMALE AND NON-PREGNANT FEMALE:     LESS THAN 5 mIU/mL Performed at [facility], [address], [place], [place] [address]   Comprehensive metabolic panel     Status: Abnormal   Collection Time: 03/30/23  6:56 PM  Result Value Ref Range   Sodium 139 135 - 145 mmol/L   Potassium 3.4 (L) 3.5 - 5.1 mmol/L   Chloride 108 98 - 111 mmol/L   CO2 22 22 - 32 mmol/L   Glucose, Bld 107 (H) 70 - 99 mg/dL    Comment: Glucose reference range applies only to samples taken after fasting for at least 8 hours.   BUN 8 6 - 20 mg/dL   Creatinine, Ser 4.69 0.44 - 1.00 mg/dL   Calcium 9.3 8.9 - 62.9 mg/dL   Total Protein 6.8 6.5 - 8.1 g/dL   Albumin 3.6 3.5 - 5.0 g/dL   AST 16 15 - 41 U/L   ALT 13 0 - 44 U/L   Alkaline Phosphatase 52 38 - 126 U/L   Total Bilirubin 0.3 0.0 -  1.2 mg/dL   GFR, Estimated >40 >98 mL/min    Comment: (NOTE) Calculated using the CKD-EPI Creatinine Equation (2021)    Anion gap 9 5 - 15    Comment: Performed at [facility], [address] [place], [place] [address]  CBC     Status: Abnormal   Collection Time: 03/30/23  6:56 PM  Result Value Ref Range   WBC 7.1 4.0 - 10.5 K/uL   RBC 4.78 3.87 - 5.11 MIL/uL   Hemoglobin 10.6 (L) 12.0 - 15.0 g/dL   HCT 78.2 (L) 95.6 - 21.3 %   MCV 70.3 (L) 80.0 - 100.0 fL   MCH 22.2 (L) 26.0 - 34.0 pg   MCHC 31.5 30.0 - 36.0 g/dL   RDW 08.6 (H) 57.8 - 46.9 %   Platelets 353 150 - 400 K/uL   nRBC 0.0 0.0 - 0.2 %    Comment: Performed at [facility], [address], [place], [place] [address]  Rh IG workup (includes ABO/Rh)     Status: None (Preliminary result)   Collection Time: 03/30/23  9:00 PM  Result Value Ref Range   Gestational Age(Wks) 5    Unit Number [id number]    Blood Component Type RHIG    Unit division 00    Status of Unit  ISSUED    Transfusion Status      OK TO TRANSFUSE Performed at [facility], [address], [place], [place] [address]     IMAGING US OB LESS THAN 14 WEEKS WITH OB TRANSVAGINAL Result Date: 03/30/2023 CLINICAL DATA:  Vaginal bleeding, known pregnancy EXAM: OBSTETRIC <14 WK [place] AND TRANSVAGINAL OB US TECHNIQUE: Both transabdominal and transvaginal ultrasound examinations were performed for complete evaluation of the gestation as well as the maternal uterus, adnexal regions, and pelvic cul-de-sac. Transvaginal technique was performed to assess early pregnancy. COMPARISON:  12/30/2018 FINDINGS: Intrauterine gestational sac: Present Yolk sac:  Absent Embryo:  Absent MSD: 6 mm   5 w   2 d Subchorionic hemorrhage:  None visualized. Maternal uterus/adnexae: Ovaries are within normal limits. IMPRESSION: Probable early intrauterine gestational sac, but no yolk sac, fetal pole, or cardiac activity yet visualized. Recommend follow-up quantitative B-HCG levels and follow-up US in 14 days to assess viability. This recommendation follows SRU consensus guidelines: Diagnostic Criteria for Nonviable Pregnancy Early in the First Trimester. Malva Limes Med 2013; 440:3474-25. Electronically Signed   By: Alcide Clever M.D.   On: 03/30/2023 19:51    MAU COURSE TVUS reveals early GS without YS or FP Images reviewed by self. No adnexal mass or free fluid. RH negative Given Rhogam Given Flexeril-->headache relieved.  ASSESSMENT 1. [redacted] weeks gestation of pregnancy   2. Threatened abortion   3. Pregnancy headache in first trimester   4. Rh negative state in antepartum period     PLAN Discharge home Bleeding and warning signs reviewed F/u TVUS in 2 weeks for viability Zofran for N/V given.   Follow-up Information     [facility] Follow up.   Contact information: [address] [place] [address] [phone]        [facility] [employer] [facility] Follow up.   Specialty: Obstetrics and Gynecology Contact information: [address] [place] [address] [phone]               Allergies as of 03/30/2023   No Known Allergies  Medication List     STOP taking these medications    ondansetron 4 MG tablet Commonly known as: ZOFRAN   phentermine 37.5 MG tablet Commonly known as: ADIPEX-P       TAKE these medications    FLUoxetine 40 MG capsule Commonly known as: PROZAC Take 1 capsule (40 mg total) by mouth daily.   folic acid 1 MG tablet Commonly known as: FOLVITE Take 1 tablet (1 mg total) by mouth daily.   Iron (Ferrous Sulfate) 325 (65 Fe) MG Tabs Take 325 mg by mouth 2 (two) times daily.   multivitamin with minerals Tabs tablet Take 1 tablet by mouth daily.   ondansetron 4 MG disintegrating tablet Commonly known as: ZOFRAN-ODT Take 1 tablet (4 mg total) by mouth every 8 (eight) hours as needed for nausea.   polyethylene glycol 17 g packet Commonly known as: MiraLax Take 17 g by mouth daily.   QUEtiapine 200 MG tablet Commonly known as: SEROQUEL Take 1 tablet (200 mg total) by mouth at bedtime.   senna-docusate 8.6-50 MG tablet Commonly known as: Senokot-S Take 1 tablet by mouth 2 (two) times daily.   thiamine 100 MG tablet Commonly known as: Vitamin B-1 Take 1 tablet (100 mg total) by mouth daily.        Evaluation does not show pathology that would require ongoing emergent intervention or inpatient treatment. Patient is hemodynamically stable and mentating appropriately. Discussed findings and plan with patient, who agrees with care plan. All questions answered. Return precautions discussed and outpatient follow up recommendations given.  Reva Bores, MD 03/30/2023 8:57 PM

## 2023-03-31 LAB — RH IG WORKUP (INCLUDES ABO/RH)
Gestational Age(Wks): 5
Unit division: 0

## 2023-04-05 ENCOUNTER — Encounter (HOSPITAL_COMMUNITY): Payer: Self-pay | Admitting: Obstetrics and Gynecology

## 2023-04-05 ENCOUNTER — Inpatient Hospital Stay (HOSPITAL_COMMUNITY)
Admission: AD | Admit: 2023-04-05 | Discharge: 2023-04-05 | Disposition: A | Discharge status: Home / Self Care | Payer: Self-pay | Attending: Obstetrics and Gynecology | Admitting: Obstetrics and Gynecology

## 2023-04-05 DIAGNOSIS — O99891 Dorsalgia, unspecified: Secondary | ICD-10-CM

## 2023-04-05 DIAGNOSIS — Z3A01 Less than 8 weeks gestation of pregnancy: Secondary | ICD-10-CM | POA: Diagnosis not present

## 2023-04-05 DIAGNOSIS — W109XXA Fall (on) (from) unspecified stairs and steps, initial encounter: Secondary | ICD-10-CM | POA: Insufficient documentation

## 2023-04-05 DIAGNOSIS — O9A211 Injury, poisoning and certain other consequences of external causes complicating pregnancy, first trimester: Secondary | ICD-10-CM | POA: Insufficient documentation

## 2023-04-05 DIAGNOSIS — W19XXXA Unspecified fall, initial encounter: Secondary | ICD-10-CM

## 2023-04-05 DIAGNOSIS — M5459 Other low back pain: Secondary | ICD-10-CM | POA: Diagnosis present

## 2023-04-05 LAB — URINALYSIS, ROUTINE W REFLEX MICROSCOPIC
Bilirubin Urine: NEGATIVE
Glucose, UA: NEGATIVE mg/dL
Hgb urine dipstick: NEGATIVE
Ketones, ur: NEGATIVE mg/dL
Leukocytes,Ua: NEGATIVE
Nitrite: NEGATIVE
Protein, ur: NEGATIVE mg/dL
Specific Gravity, Urine: 1.016 (ref 1.005–1.030)
pH: 6 (ref 5.0–8.0)

## 2023-04-05 MED ORDER — CYCLOBENZAPRINE HCL 5 MG PO TABS
10.0000 mg | ORAL_TABLET | Freq: Once | ORAL | Status: AC
  Administered 2023-04-05: 10 mg via ORAL
  Filled 2023-04-05: qty 2

## 2023-04-05 MED ORDER — ACETAMINOPHEN 500 MG PO TABS
1000.0000 mg | ORAL_TABLET | Freq: Once | ORAL | Status: AC
  Administered 2023-04-05: 1000 mg via ORAL
  Filled 2023-04-05: qty 2

## 2023-04-05 MED ORDER — CYCLOBENZAPRINE HCL 10 MG PO TABS: 10.0000 mg | ORAL_TABLET | Freq: Two times a day (BID) | ORAL | 0 refills | Status: DC | PRN

## 2023-04-05 NOTE — MAU Note (Signed)
 Katrina Stewart is a 28 y.o. at [redacted]w[redacted]d here in MAU reporting: slipped going down the steps this morning. Went down about 6 steps on her back.  Back has been hurting ever since, now feeling crampy. No bleeding.   Onset of complaint: 0800 Pain score: abd 8,back 9 Vitals:   04/05/23 1419  BP: 136/83  Pulse: 100  Resp: 18  Temp: 99 F (37.2 C)  SpO2: 100%      Lab orders placed from triage:

## 2023-04-05 NOTE — MAU Provider Note (Signed)
 Event Date/Time   First Provider Initiated Contact with Patient 04/05/23 1455      S [name] is a 28 y.o. [phone] patient who presents to MAU today with complaint of a slip and fall on her buttocks @ ~ 0800. She reports that she took some Extra strength tylenol afterwards but now is complaining of it hurting again. Denies any VB, LOF, and denies any OB complaints at this time. IUP was confirmed on 03/30/23 and her NOB is scheduled.   O BP (!) 141/72 (BP Location: Right Arm)   Pulse 90   Temp 99 F (37.2 C) (Oral)   Resp 18   Ht 5\' 9"  (1.753 m)   Wt 127.8 kg   LMP 02/22/2023   SpO2 100%   BMI 41.60 kg/m  Physical Exam Vitals and nursing note reviewed.  Constitutional:      General: She is not in acute distress.    Appearance: She is obese. She is not ill-appearing.  HENT:     Head: Normocephalic.  Cardiovascular:     Rate and Rhythm: Normal rate and regular rhythm.  Pulmonary:     [place]: Pulmonary effort is normal.     Breath sounds: Normal breath sounds.  Musculoskeletal:     Lumbar back: Tenderness present. No swelling, edema or lacerations.       Back:     Comments: B/L mild tenderness on palpation. No lacerations, bruises or evidence of trauma visualized   Skin:    General: Skin is warm and dry.  Neurological:     Mental Status: She is alert and oriented to person, place, and time.  Psychiatric:        Mood and Affect: Mood normal.        Behavior: Behavior normal.     MDM  MODERATE  Tylenol/Flexeril for back pain given with improvement   I have reviewed the patient chart and performed the physical exam . Medications ordered as stated below.  A/P as described below.  Counseling and education provided and patient agreeable  with plan as described below. Verbalized understanding.     Orders Placed This Encounter  Procedures   Urinalysis, Routine w reflex microscopic -Urine, Random    Standing Status:   Standing    Number of Occurrences:   1     Specimen Source:   Urine, Random [244]   Discharge patient Discharge disposition: 01-Home or Self Care; Discharge patient date: 04/05/2023    Standing Status:   Standing    Number of Occurrences:   1    Discharge disposition:   01-Home or Self Care [1]    Discharge patient date:   04/05/2023    Meds ordered this encounter  Medications   acetaminophen (TYLENOL) tablet 1,000 mg   cyclobenzaprine (FLEXERIL) tablet 10 mg   cyclobenzaprine (FLEXERIL) 10 MG tablet    Sig: Take 1 tablet (10 mg total) by mouth 2 (two) times daily as needed for muscle spasms.    Dispense:  20 tablet    Refill:  0    Supervising Provider:   Samara Snide     ASSESSMENT Medical screening exam complete  1. Fall, initial encounter (Primary)  2. [redacted] weeks gestation of pregnancy  3. Back pain affecting pregnancy in first trimester     PLAN  Discharge from MAU in stable condition F/U as scheduled Please see AVS for detailed verbal and written information provided to the patient at discharge Patient given the option of  transfer to [facility] for further evaluation or seek care in outpatient facility of choice  List of options for follow-up given  Warning signs for worsening condition that would warrant emergency follow-up discussed Patient may return to MAU as needed   Future Appointments  Date Time Provider Department Center  04/07/2023  3:30 PM Lamar Sprinkles, MD [facility] None  04/21/2023  3:40 PM Georganna Skeans, MD [facility] None     Colman Cater, NP 04/05/2023 4:07 PM

## 2023-04-05 NOTE — Discharge Instructions (Signed)
 Safe Medications in Pregnancy   Salon Pas   Acne:  Benzoyl Peroxide  Salicylic Acid   Backache/Headache:  Tylenol: 2 regular strength every 4 hours OR               2 Extra strength every 6 hours   Colds/Coughs/Allergies:  Benadryl (alcohol free) 25 mg every 6 hours as needed  Breath right strips  Claritin  Cepacol throat lozenges  Chloraseptic throat spray  Cold-Eeze- up to three times per day  Cough drops, alcohol free  Flonase (by prescription only)  Guaifenesin  Mucinex  Robitussin DM (plain only, alcohol free)  Saline nasal spray/drops  Sudafed (pseudoephedrine) & Actifed * use only after [redacted] weeks gestation and if you do not have high blood pressure  Tylenol  Vicks Vaporub  Zinc lozenges  Zyrtec   Constipation:  Colace  Ducolax suppositories  Fleet enema  Glycerin suppositories  Metamucil  Milk of magnesia  Miralax  Senokot  Smooth move tea   Diarrhea:  Kaopectate  Imodium A-D   *NO pepto Bismol   Hemorrhoids:  Anusol  Anusol HC  Preparation H  Tucks   Indigestion:  Tums  Maalox  Mylanta  Zantac  Pepcid   Insomnia:  Benadryl (alcohol free) 25mg  every 6 hours as needed  Tylenol PM  Unisom, no Gelcaps   Leg Cramps:  Tums  MagGel   Nausea/Vomiting:  Bonine  Dramamine  Emetrol  Ginger extract  Sea bands  Meclizine  Nausea medication to take during pregnancy:  Unisom (doxylamine succinate 25 mg tablets) Take one tablet daily at bedtime. If symptoms are not adequately controlled, the dose can be increased to a maximum recommended dose of two tablets daily (1/2 tablet in the morning, 1/2 tablet mid-afternoon and one at bedtime).  Vitamin B6 100mg  tablets. Take one tablet twice a day (up to 200 mg per day).   Skin Rashes:  Aveeno products  Benadryl cream or 25mg  every 6 hours as needed  Calamine Lotion  1% cortisone cream   Yeast infection:  Gyne-lotrimin 7  Monistat 7    **If taking multiple medications, please check labels to  avoid duplicating the same active ingredients  **take medication as directed on the label  ** Do not exceed 4000 mg of tylenol in 24 hours  **Do not take medications that contain aspirin or ibuprofen   [facility] [employer] [facility] [employer] [facility] [employer] [facility] [employer] [facility]    Phone: [phone]  [facility] [employer] at [place]   Phone: [phone]  [facility] [employer] at [place]  Phone: [phone]  [facility] [employer] at [employer]  Phone: [phone]  [facility] [employer] at [place]  Phone: [phone]  [facility] at [facility]   Phone: [phone]  [facility]       Phone: [phone]  [facility]    Phone: [phone]   [facility]    Phone: [phone]  [facility]    Phone: [phone]  [facility]    Phone: [phone]  [facility]       Phone: [phone]   [facility]-M[facility]  Phone: [phone]  [facility]    Phone: [phone]  [facility] [place]   Phone: [phone]  [facility]      Phone: [phone]  [facility]    Phone: [phone]

## 2023-04-06 ENCOUNTER — Telehealth: Payer: Self-pay | Admitting: Family Medicine

## 2023-04-06 NOTE — Telephone Encounter (Signed)
 Called to get scheduled from inbasket, patient did not answer and left message to call back and schedule appt

## 2023-04-07 ENCOUNTER — Encounter (HOSPITAL_COMMUNITY): Payer: Self-pay | Admitting: Student

## 2023-04-07 ENCOUNTER — Ambulatory Visit (HOSPITAL_COMMUNITY): Payer: Medicaid Other | Admitting: Student

## 2023-04-07 ENCOUNTER — Other Ambulatory Visit: Payer: Self-pay

## 2023-04-07 VITALS — BP 137/71 | HR 91 | Ht 69.0 in | Wt 279.0 lb

## 2023-04-07 DIAGNOSIS — F1090 Alcohol use, unspecified, uncomplicated: Secondary | ICD-10-CM

## 2023-04-07 DIAGNOSIS — F4312 Post-traumatic stress disorder, chronic: Secondary | ICD-10-CM

## 2023-04-07 DIAGNOSIS — F411 Generalized anxiety disorder: Secondary | ICD-10-CM | POA: Diagnosis not present

## 2023-04-07 DIAGNOSIS — F3160 Bipolar disorder, current episode mixed, unspecified: Secondary | ICD-10-CM

## 2023-04-07 DIAGNOSIS — Z3A01 Less than 8 weeks gestation of pregnancy: Secondary | ICD-10-CM

## 2023-04-07 DIAGNOSIS — O3680X0 Pregnancy with inconclusive fetal viability, not applicable or unspecified: Secondary | ICD-10-CM

## 2023-04-07 MED ORDER — QUETIAPINE FUMARATE 100 MG PO TABS: 100.0000 mg | ORAL_TABLET | Freq: Every day | ORAL | 1 refills | Status: DC

## 2023-04-07 MED ORDER — FLUOXETINE HCL 40 MG PO CAPS
40.0000 mg | ORAL_CAPSULE | Freq: Every day | ORAL | 1 refills | Status: DC
Start: 2023-04-07 — End: 2023-05-12
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
## 2023-04-11 ENCOUNTER — Ambulatory Visit

## 2023-04-11 ENCOUNTER — Other Ambulatory Visit: Payer: Self-pay

## 2023-04-11 DIAGNOSIS — Z3491 Encounter for supervision of normal pregnancy, unspecified, first trimester: Secondary | ICD-10-CM | POA: Diagnosis not present

## 2023-04-11 DIAGNOSIS — Z3A01 Less than 8 weeks gestation of pregnancy: Secondary | ICD-10-CM

## 2023-04-11 DIAGNOSIS — O3680X0 Pregnancy with inconclusive fetal viability, not applicable or unspecified: Secondary | ICD-10-CM
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
## 2023-04-17 ENCOUNTER — Encounter (HOSPITAL_COMMUNITY): Payer: Self-pay | Admitting: Obstetrics and Gynecology

## 2023-04-17 ENCOUNTER — Inpatient Hospital Stay (HOSPITAL_COMMUNITY)
Admission: AD | Admit: 2023-04-17 | Discharge: 2023-04-17 | Disposition: A | Discharge status: Home / Self Care | Payer: Self-pay | Attending: Obstetrics and Gynecology | Admitting: Obstetrics and Gynecology

## 2023-04-17 DIAGNOSIS — R109 Unspecified abdominal pain: Secondary | ICD-10-CM | POA: Insufficient documentation

## 2023-04-17 DIAGNOSIS — O26899 Other specified pregnancy related conditions, unspecified trimester: Secondary | ICD-10-CM

## 2023-04-17 DIAGNOSIS — Z3A01 Less than 8 weeks gestation of pregnancy: Secondary | ICD-10-CM

## 2023-04-17 DIAGNOSIS — R519 Headache, unspecified: Secondary | ICD-10-CM | POA: Insufficient documentation

## 2023-04-17 DIAGNOSIS — O30041 Twin pregnancy, dichorionic/diamniotic, first trimester: Secondary | ICD-10-CM | POA: Diagnosis not present

## 2023-04-17 DIAGNOSIS — O26891 Other specified pregnancy related conditions, first trimester: Secondary | ICD-10-CM | POA: Diagnosis not present

## 2023-04-17 LAB — URINALYSIS, ROUTINE W REFLEX MICROSCOPIC
Bilirubin Urine: NEGATIVE
Glucose, UA: NEGATIVE mg/dL
Hgb urine dipstick: NEGATIVE
Ketones, ur: NEGATIVE mg/dL
Leukocytes,Ua: NEGATIVE
Nitrite: NEGATIVE
Protein, ur: NEGATIVE mg/dL
Specific Gravity, Urine: 1.021 (ref 1.005–1.030)
pH: 5 (ref 5.0–8.0)

## 2023-04-17 MED ORDER — ACETAMINOPHEN 500 MG PO TABS
1000.0000 mg | ORAL_TABLET | Freq: Once | ORAL | Status: AC
  Administered 2023-04-17: 1000 mg via ORAL
  Filled 2023-04-17: qty 2

## 2023-04-17 MED ORDER — CYCLOBENZAPRINE HCL 5 MG PO TABS
10.0000 mg | ORAL_TABLET | Freq: Once | ORAL | Status: AC
  Administered 2023-04-17: 10 mg via ORAL
  Filled 2023-04-17: qty 2

## 2023-04-17 NOTE — MAU Note (Signed)
.  Katrina Stewart is a 28 y.o. at [redacted]w[redacted]d here in MAU reporting: Back pain, abdominal pain, HA, and dizziness. She reports last night she moved a certain way and since then has been experiencing lower abdominal cramping and lower back pain that is across her entire lower back. Evaluated in MAU on 2/26 for abdominal pain and HA x4 days that was treated and relieved by Flexeril. Evaluated in MAU on 3/4 for back and abdominal pain. Treated and relieved by Flexeril. Denies recent IC. Denies VB, vaginal itching, and vaginal odors.  Last doses: Tylenol 1000mg  at 1000 this morning Flexeril - did not take - her pharmacy did not have it  NPO: Solids yesterday Fluids - half bottle of water around 1000  Onset of complaint: Pain score: 9/10 lower back and 9/10 lower abdomen  Vitals:   04/17/23 1434  BP: 128/79  Pulse: 85  Resp: 16  Temp: 99 F (37.2 C)  SpO2: 100%    FHT: n/a Lab orders placed from triage: UA

## 2023-04-17 NOTE — Discharge Instructions (Signed)
 For prevention of migraines in pregnancy: -Magnesium, 400mg  by mouth, once daily -Vitamin B2, 400mg  by mouth, once daily  For treatment of migraines in pregnancy: -take medication at the first sign of the pain of a headache, or the first sign of your aura -start with 1000mg  Tylenol (or excedrin tension headache with acetaminophen & caffeine only, NOT aspirin) with or without flexeril 10 mg -Do not take more than 4000 mg of tylenol (acetaminophen) per day    Return to care  If you have heavier bleeding that soaks through more than 2 pads per hour for an hour or more If you bleed so much that you feel like you might pass out or you do pass out If you have significant abdominal pain that is not improved with Tylenol

## 2023-04-17 NOTE — MAU Provider Note (Signed)
 History     CSN: [id number]  Arrival date and time: 04/17/23 1422   Event Date/Time   First Provider Initiated Contact with Patient 04/17/23 1525      Chief Complaint  Patient presents with   Abdominal Pain   Back Pain   Dizziness   Headache   HPI Katrina Stewart is a 28 y.o. [id number] at [redacted]w[redacted]d with di/di twins who presents with abdominal cramping & headache. Reports intermittent headaches for the last few weeks. Current headache since yesterday. Took tylenol this morning for it. Previously given flexeril with good relief but it wasn't available at the pharmacy when she went to pick it up last week. Also reports lower abdominal cramping that radiates to her low back. Endorses nausea that has been ongoing with pregnancy. Denies vomiting, diarrhea, dysuria, vaginal bleeding, or vaginal discharge.   OB History     Gravida  4   Para  2   Term  2   Preterm      AB  1   Living  2      SAB  1   IAB  0   Ectopic      Multiple  0   Live Births  2           Past Medical History:  Diagnosis Date   Anxiety    COVID-19 virus infection 12/05/2018   + again on 2/4 (asymptomatic)  postiive test in MAU on 11-2   Depression    Depression    Phreesia 12/17/2019   Depression    Phreesia 12/30/2019   Essential hypertension    Gestational diabetes mellitus (GDM)    Normal postpartum 2 hr GTT   Group B streptococcal infection in pregnancy 03/13/2019   Rh negative state in antepartum period 09/03/2015   [x]  needs rhogam at 28 weeks  [name] postive anti-D on initial lab. Patient received rhogam on 08/02/18 during MAU visit   Rubella non-immune status, antepartum 09/03/2015   Vaginal Pap smear, abnormal     Past Surgical History:  Procedure Laterality Date   BREAST SURGERY  2013   Lt & Rt excision juvenile fibroadenoma   MASS EXCISION  10/29/2011   Procedure: EXCISION MASS;  Surgeon: Shelly Rubenstein, MD;  Location: [facility];  Service: General;  Laterality: Bilateral;   excision of bilateral breast masses    Family History  Problem Relation Age of Onset   Diabetes Mother    Asthma Brother    Cancer Maternal Grandmother        lung cancer   Cancer Other        bladder cancer    Social History   Tobacco Use   Smoking status: Never    Passive exposure: Never   Smokeless tobacco: Never  Vaping Use   Vaping status: Never Used  Substance Use Topics   Alcohol use: Not Currently    Alcohol/week: 2.0 - 3.0 standard drinks of alcohol    Types: 2 - 3 Shots of liquor per week    Comment: occasional   Drug use: No    Allergies: No Known Allergies  Medications Prior to Admission  Medication Sig Dispense Refill Last Dose/Taking   cyclobenzaprine (FLEXERIL) 10 MG tablet Take 1 tablet (10 mg total) by mouth 2 (two) times daily as needed for muscle spasms. (Patient not taking: Reported on 04/07/2023) 20 tablet 0    FLUoxetine (PROZAC) 40 MG capsule Take 1 capsule (40 mg total) by mouth daily. 30 capsule 1  folic acid (FOLVITE) 1 MG tablet Take 1 tablet (1 mg total) by mouth daily. 30 tablet 0    Iron, Ferrous Sulfate, 325 (65 Fe) MG TABS Take 325 mg by mouth 2 (two) times daily. 60 tablet 3    Multiple Vitamin (MULTIVITAMIN WITH MINERALS) TABS tablet Take 1 tablet by mouth daily. (Patient not taking: Reported on 02/17/2023) 30 tablet 0    ondansetron (ZOFRAN-ODT) 4 MG disintegrating tablet Take 1 tablet (4 mg total) by mouth every 8 (eight) hours as needed for nausea. 42 tablet 2    polyethylene glycol (MIRALAX) 17 g packet Take 17 g by mouth daily. (Patient not taking: Reported on 04/07/2023) 14 each 0    QUEtiapine (SEROQUEL) 100 MG tablet Take 1 tablet (100 mg total) by mouth at bedtime. 30 tablet 1    senna-docusate (SENOKOT-S) 8.6-50 MG tablet Take 1 tablet by mouth 2 (two) times daily. (Patient not taking: Reported on 04/07/2023) 30 tablet 0    thiamine (VITAMIN B-1) 100 MG tablet Take 1 tablet (100 mg total) by mouth daily. 30 tablet 5     Review of  Systems  All other systems reviewed and are negative.  Physical Exam   Blood pressure 128/79, pulse 85, temperature 99 F (37.2 C), temperature source Oral, resp. rate 16, last menstrual period 02/22/2023, SpO2 100%.  Physical Exam Vitals and nursing note reviewed.  Constitutional:      General: She is not in acute distress.    Appearance: She is well-developed. She is not ill-appearing.  HENT:     Head: Normocephalic and atraumatic.  Eyes:     General: No scleral icterus.       Right eye: No discharge.        Left eye: No discharge.     Conjunctiva/sclera: Conjunctivae normal.  Pulmonary:     Effort: Pulmonary effort is normal. No respiratory distress.  Abdominal:     Palpations: Abdomen is soft.     Tenderness: There is no abdominal tenderness.  Neurological:     General: No focal deficit present.     Mental Status: She is alert.  Psychiatric:        Mood and Affect: Mood normal.        Behavior: Behavior normal.    Pt informed that the ultrasound is considered a limited OB ultrasound and is not intended to be a complete ultrasound exam.  Patient also informed that the ultrasound is not being completed with the intent of assessing for fetal or placental anomalies or any pelvic abnormalities.  Explained that the purpose of today's ultrasound is to assess for  viability.  Patient acknowledges the purpose of the exam and the limitations of the study.    Viable twin pregnancy. Baby A 148 bpm, baby B 156 bpm MAU Course  Procedures Results for orders placed or performed during the hospital encounter of 04/17/23 (from the past 24 hours)  Urinalysis, Routine w reflex microscopic -Urine, Clean Catch     Status: Abnormal   Collection Time: 04/17/23  3:07 PM  Result Value Ref Range   Color, Urine YELLOW YELLOW   APPearance HAZY (A) CLEAR   Specific Gravity, Urine 1.021 1.005 - 1.030   pH 5.0 5.0 - 8.0   Glucose, UA NEGATIVE NEGATIVE mg/dL   Hgb urine dipstick NEGATIVE NEGATIVE    Bilirubin Urine NEGATIVE NEGATIVE   Ketones, ur NEGATIVE NEGATIVE mg/dL   Protein, ur NEGATIVE NEGATIVE mg/dL   Nitrite NEGATIVE NEGATIVE   Leukocytes,Ua NEGATIVE  NEGATIVE    MDM   Assessment and Plan   1. Headache in pregnancy, antepartum, first trimester   2. Abdominal cramping affecting pregnancy   3. Dichorionic diamniotic twin pregnancy in first trimester   4. [redacted] weeks gestation of pregnancy    -Headache & abdominal cramping resolved with tylenol & flexeril given in MAU. Encouraged patient to pick up prescription from the pharmacy.  -No vaginal bleeding. U/a negative for infection. Twin pregnancy with FCA on BSUS. Patient reassured.  -Reviewed reasons to return to MAU  Judeth Horn 04/17/2023, 3:25 PM

## 2023-04-21 ENCOUNTER — Ambulatory Visit: Payer: Medicaid Other | Admitting: Family Medicine

## 2023-05-03 ENCOUNTER — Telehealth

## 2023-05-03 DIAGNOSIS — O26891 Other specified pregnancy related conditions, first trimester: Secondary | ICD-10-CM

## 2023-05-03 DIAGNOSIS — Z3A10 10 weeks gestation of pregnancy: Secondary | ICD-10-CM

## 2023-05-03 DIAGNOSIS — Z6791 Unspecified blood type, Rh negative: Secondary | ICD-10-CM

## 2023-05-03 DIAGNOSIS — D563 Thalassemia minor: Secondary | ICD-10-CM

## 2023-05-03 DIAGNOSIS — O10919 Unspecified pre-existing hypertension complicating pregnancy, unspecified trimester: Secondary | ICD-10-CM

## 2023-05-03 DIAGNOSIS — O24419 Gestational diabetes mellitus in pregnancy, unspecified control: Secondary | ICD-10-CM | POA: Diagnosis not present

## 2023-05-03 DIAGNOSIS — O0991 Supervision of high risk pregnancy, unspecified, first trimester: Secondary | ICD-10-CM | POA: Insufficient documentation

## 2023-05-03 DIAGNOSIS — O0990 Supervision of high risk pregnancy, unspecified, unspecified trimester: Secondary | ICD-10-CM | POA: Insufficient documentation

## 2023-05-03 DIAGNOSIS — O10911 Unspecified pre-existing hypertension complicating pregnancy, first trimester: Secondary | ICD-10-CM

## 2023-05-03 MED ORDER — BLOOD PRESSURE KIT DEVI: 1.0000 | 0 refills | Status: AC | PRN

## 2023-05-03 MED ORDER — GOJJI WEIGHT SCALE MISC: 1.0000 | 0 refills | Status: AC | PRN

## 2023-05-03 NOTE — Progress Notes (Signed)
 New OB Intake  I connected with Katrina Stewart  on 05/03/23 at  1:15 PM EDT by MyChart Video Visit and verified that I am speaking with the correct person using two identifiers. Nurse is located at [facility] and pt is located at home.  I discussed the limitations, risks, security and privacy concerns of performing an evaluation and management service by telephone and the availability of in person appointments. I also discussed with the patient that there may be a patient responsible charge related to this service. The patient expressed understanding and agreed to proceed.  I explained I am completing New OB Intake today. We discussed EDD of 11/29/2023, by Last Menstrual Stewart. Pt is Katrina Stewart. I reviewed her allergies, medications and Medical/Surgical/OB history.    Patient Active Problem List   Diagnosis Date Noted   Katrina Stewart 05/03/2023   Katrina Stewart 05/03/2023   Katrina Stewart 01/08/2019   Katrina Stewart 11/20/2018   Katrina Stewart 09/03/2015    Concerns addressed today -Patient is aware she will need to do an early 2 hours tolerance due to Katrina of Gestational diabetes.    Delivery Plans Plans to deliver at [facility] Discussed the nature of our practice with multiple providers including residents and students. Due to the size of the practice, the delivering provider may not be the same as those providing prenatal care.   Patient is not interested in water birth. Offered upcoming OB visit with CNM to discuss further.  MyChart/Babyscripts MyChart access verified. I explained pt will have some visits in office and some virtually. Babyscripts instructions given and order placed. Patient verifies receipt of registration text/e-mail. Account successfully created and app downloaded. If patient is a candidate for Optimized scheduling, add to  sticky note.   Blood Pressure Cuff/Weight Scale Blood pressure cuff ordered for patient to pick-up from Katrina Stewart. Explained after first prenatal appt pt will check weekly and document in Babyscripts. Patient does not have weight scale; order sent to Katrina Stewart, patient may track weight weekly in Babyscripts.  Anatomy US Unable to get MFM to schedule. Will contact patient when schedule.   Is patient a CenteringPregnancy candidate?  Declined Declined due to [facility] setting    Is patient a Mom+Baby Combined Care candidate?  Not a candidate   If accepted, confirm patient does not intend to move from the area for at least 12 months, then notify Mom+Baby staff  Interested in [place]? If yes, send referral and doula dot phrase.   Is patient a candidate for Babyscripts Optimization? No, due to twins   First visit review I reviewed new OB appt with patient. Explained pt will be seen by Katrina Stewart, CNM at first visit. Discussed [name] genetic screening with patient. Will need  Panorama only. Routine prenatal labs is not   Last Pap Diagnosis  Date Value Ref Range Status  12/31/2019      - Negative for intraepithelial lesion or malignancy (NILM)    Katrina Stewart, CMA 05/03/2023  1:46 PM

## 2023-05-05 ENCOUNTER — Encounter (HOSPITAL_COMMUNITY): Payer: Self-pay | Admitting: Obstetrics and Gynecology

## 2023-05-05 ENCOUNTER — Inpatient Hospital Stay (HOSPITAL_COMMUNITY)
Admission: AD | Admit: 2023-05-05 | Discharge: 2023-05-05 | Disposition: A | Discharge status: Home / Self Care | Attending: Obstetrics and Gynecology | Admitting: Obstetrics and Gynecology

## 2023-05-05 DIAGNOSIS — Z3A10 10 weeks gestation of pregnancy: Secondary | ICD-10-CM | POA: Insufficient documentation

## 2023-05-05 DIAGNOSIS — O99281 Endocrine, nutritional and metabolic diseases complicating pregnancy, first trimester: Secondary | ICD-10-CM | POA: Diagnosis not present

## 2023-05-05 DIAGNOSIS — O210 Mild hyperemesis gravidarum: Secondary | ICD-10-CM | POA: Insufficient documentation

## 2023-05-05 DIAGNOSIS — R112 Nausea with vomiting, unspecified: Secondary | ICD-10-CM | POA: Diagnosis present

## 2023-05-05 DIAGNOSIS — E871 Hypo-osmolality and hyponatremia: Secondary | ICD-10-CM | POA: Diagnosis not present

## 2023-05-05 DIAGNOSIS — O219 Vomiting of pregnancy, unspecified: Secondary | ICD-10-CM

## 2023-05-05 HISTORY — DX: Urinary tract infection, site not specified: N39.0

## 2023-05-05 LAB — URINALYSIS, ROUTINE W REFLEX MICROSCOPIC
Bilirubin Urine: NEGATIVE
Glucose, UA: NEGATIVE mg/dL
Hgb urine dipstick: NEGATIVE
Ketones, ur: NEGATIVE mg/dL
Leukocytes,Ua: NEGATIVE
Nitrite: NEGATIVE
Protein, ur: NEGATIVE mg/dL
Specific Gravity, Urine: 1.011 (ref 1.005–1.030)
pH: 6 (ref 5.0–8.0)

## 2023-05-05 LAB — COMPREHENSIVE METABOLIC PANEL WITH GFR
ALT: 9 U/L (ref 0–44)
AST: 16 U/L (ref 15–41)
Albumin: 3.7 g/dL (ref 3.5–5.0)
Alkaline Phosphatase: 37 U/L — ABNORMAL LOW (ref 38–126)
Anion gap: 12 (ref 5–15)
BUN: 6 mg/dL (ref 6–20)
CO2: 18 mmol/L — ABNORMAL LOW (ref 22–32)
Calcium: 9.3 mg/dL (ref 8.9–10.3)
Chloride: 104 mmol/L (ref 98–111)
Creatinine, Ser: 0.72 mg/dL (ref 0.44–1.00)
GFR, Estimated: >60 mL/min (ref >60)
Glucose, Bld: 91 mg/dL (ref 70–99)
Potassium: 3.8 mmol/L (ref 3.5–5.1)
Sodium: 134 mmol/L — ABNORMAL LOW (ref 135–145)
Total Bilirubin: 0.3 mg/dL (ref 0.0–1.2)
Total Protein: 6.8 g/dL (ref 6.5–8.1)

## 2023-05-05 MED ORDER — ACETAMINOPHEN 500 MG PO TABS
1000.0000 mg | ORAL_TABLET | Freq: Four times a day (QID) | ORAL | Status: DC | PRN
  Administered 2023-05-05: 1000 mg via ORAL
  Filled 2023-05-05: qty 2

## 2023-05-05 MED ORDER — ONDANSETRON HCL 4 MG/2ML IJ SOLN
4.0000 mg | Freq: Once | INTRAMUSCULAR | Status: AC
  Administered 2023-05-05: 4 mg via INTRAVENOUS
  Filled 2023-05-05: qty 2

## 2023-05-05 MED ORDER — LACTATED RINGERS IV BOLUS
1000.0000 mL | Freq: Once | INTRAVENOUS | Status: AC
  Administered 2023-05-05: 1000 mL via INTRAVENOUS

## 2023-05-05 MED ORDER — SCOPOLAMINE 1 MG/3DAYS TD PT72: 1.0000 | MEDICATED_PATCH | TRANSDERMAL | 12 refills | Status: DC

## 2023-05-05 MED ORDER — SCOPOLAMINE 1 MG/3DAYS TD PT72
1.0000 | MEDICATED_PATCH | TRANSDERMAL | Status: DC
  Administered 2023-05-05: 1.5 mg via TRANSDERMAL
  Filled 2023-05-05: qty 1

## 2023-05-05 NOTE — Discharge Instructions (Signed)
 It was a pleasure taking care of you today.  I am glad that the medications help with your nausea.  I sent a prescription for scopolamine to your pharmacy which you can replace the patch every 3 days.  If you have worsening symptoms you can either go to your [facility] provider or come back for further evaluation.  I hope you have a great afternoon!

## 2023-05-05 NOTE — MAU Note (Signed)
 Katrina Stewart is a 28 y.o. at [redacted]w[redacted]d here in MAU reporting: having real bad cramping at the top and at the bottom of her abd.  Having real bad HA and the nausea is real bad.  Took Tylenol last night for the HA, but threw it up.  Took Zofran around midnight.  Has thrown up this morning. No bleeding or d/c. Onset of complaint: abd 0030, HA since 2000 last night.  Pain score: abd 9, HA 9 Vitals:   05/05/23 0905  BP: (!) 139/92  Pulse: 86  Resp: 17  Temp: 98.9 F (37.2 C)  SpO2: 100%      Lab orders placed from triage:  urine

## 2023-05-05 NOTE — MAU Provider Note (Signed)
 History     CSN: [id number]  Arrival date and time: 05/05/23 0843   Event Date/Time   First Provider Initiated Contact with Patient 05/05/23 1021      Chief Complaint  Patient presents with   Headache   Abdominal Pain   Nausea   Emesis   HPI Patient is a 28 year old G4, P2 at 10 weeks 2 days presenting for nausea and vomiting in pregnancy.  Has been taking ODT Zofran as well as Tylenol without relief.  She has not tried any other medications.  Has been having trouble keeping down any solids or liquids.  Has also been having a headache with the nausea.  Last took Zofran around midnight.  Took Tylenol last night as well but she reports she threw the Tylenol up.  Denies any fever or chills.  Denies any vaginal bleeding.  Reports cramping in her upper abdomen as well as the left side.  OB History     Gravida  4   Para  2   Term  2   Preterm      AB  1   Living  2      SAB  1   IAB  0   Ectopic      Multiple  0   Live Births  2           Past Medical History:  Diagnosis Date   Acute pancreatitis 12/30/2022   Alcohol abuse 12/30/2022   Alcohol use disorder    Anxiety    Anxiety and depression 12/30/2022   Bipolar affective disorder, current episode mixed (HCC) 05/05/2021   Bipolar disorder (manic depression) (HCC) 02/12/2022   BV (bacterial vaginosis) 07/19/2022   Chronic hypertension during pregnancy 11/20/2018   [X]  Aspirin 81 mg daily after 12 weeks  Current antihypertensives:  None      Baseline and surveillance labs (pulled in from [facility], refresh links as needed)           Lab Results      Component    Value    Date           PLT    315    11/20/2018           CREATININE    0.62    11/20/2018           AST    38    11/20/2018           ALT    11    11/20/2018           PROTCRRATIO              10/19/202   Chronic post-traumatic stress disorder (PTSD) 10/04/2022   Class 3 severe obesity due to excess calories without serious comorbidity with body mass  index (BMI) of 45.0 to 49.9 in adult (HCC) 05/05/2021   COVID-19 virus infection 12/05/2018   + again on 2/4 (asymptomatic)  postiive test in MAU on 11-2   Depression    Depression    Phreesia 12/17/2019   Depression    Phreesia 12/30/2019   Essential hypertension    Gestational diabetes mellitus (GDM)    Normal postpartum 2 hr GTT   Group B streptococcal infection in pregnancy 03/13/2019   History of depression 08/29/2018   History of gestational diabetes 10/03/2019   Iron deficiency anemia 01/18/2023   Prediabetes 05/05/2021   Psychophysiological insomnia 05/05/2021   Rh negative state in antepartum period 09/03/2015   [x]   needs rhogam at 28 weeks  [name] postive anti-D on initial lab. Patient received rhogam on 08/02/18 during MAU visit   Rh negative state in antepartum period 09/03/2015   [x]  needs rhogam at 28 weeks     [name] postive anti-D on initial lab. Patient received rhogam on 08/02/18 during MAU visit     Rubella non-immune status, antepartum 09/03/2015   Severe episode of recurrent major depressive disorder, without psychotic features (HCC)    Suspected sleep apnea 01/18/2023   UTI (urinary tract infection)    Vaginal Pap smear, abnormal    Vitamin D deficiency 01/18/2023    Past Surgical History:  Procedure Laterality Date   BREAST SURGERY  2013   Lt & Rt excision juvenile fibroadenoma   MASS EXCISION  10/29/2011   Procedure: EXCISION MASS;  Surgeon: Shelly Rubenstein, MD;  Location: [facility];  Service: General;  Laterality: Bilateral;  excision of bilateral breast masses    Family History  Problem Relation Age of Onset   Diabetes Mother    Hypertension Mother    Other Father        doesn't know what he died from   Asthma Brother    Cancer Maternal Grandmother        lung cancer   Cancer Other        bladder cancer    Social History   Tobacco Use   Smoking status: Never    Passive exposure: Never   Smokeless tobacco: Never  Vaping Use   Vaping  status: Never Used  Substance Use Topics   Alcohol use: Not Currently    Alcohol/week: 2.0 - 3.0 standard drinks of alcohol    Types: 2 - 3 Shots of liquor per week    Comment: occasional   Drug use: No    Allergies: No Known Allergies  Medications Prior to Admission  Medication Sig Dispense Refill Last Dose/Taking   acetaminophen (TYLENOL) 500 MG tablet Take 1,000 mg by mouth every 6 (six) hours as needed.   05/04/2023 at  8:00 PM   cyclobenzaprine (FLEXERIL) 10 MG tablet Take 1 tablet (10 mg total) by mouth 2 (two) times daily as needed for muscle spasms. 20 tablet 0 05/04/2023 at  8:00 PM   FLUoxetine (PROZAC) 40 MG capsule Take 1 capsule (40 mg total) by mouth daily. 30 capsule 1 05/04/2023   ondansetron (ZOFRAN-ODT) 4 MG disintegrating tablet Take 1 tablet (4 mg total) by mouth every 8 (eight) hours as needed for nausea. 42 tablet 2 05/05/2023 at 12:05 AM   Prenatal Vit-Fe Fumarate-FA (MULTIVITAMIN-PRENATAL) 27-0.8 MG TABS tablet Take 1 tablet by mouth daily at 12 noon.   05/04/2023   QUEtiapine (SEROQUEL) 100 MG tablet Take 1 tablet (100 mg total) by mouth at bedtime. 30 tablet 1 05/03/2023   Blood Pressure Monitoring (BLOOD PRESSURE KIT) DEVI 1 each by Does not apply route as needed. 1 each 0    folic acid (FOLVITE) 1 MG tablet Take 1 tablet (1 mg total) by mouth daily. 30 tablet 0    Iron, Ferrous Sulfate, 325 (65 Fe) MG TABS Take 325 mg by mouth 2 (two) times daily. 60 tablet 3    Misc. Devices (GOJJI WEIGHT SCALE) MISC 1 Device by Does not apply route as needed. 1 each 0    Multiple Vitamin (MULTIVITAMIN WITH MINERALS) TABS tablet Take 1 tablet by mouth daily. (Patient not taking: Reported on 02/17/2023) 30 tablet 0 Not Taking   polyethylene glycol (MIRALAX) 17 g packet  Take 17 g by mouth daily. (Patient not taking: Reported on 01/13/2023) 14 each 0    senna-docusate (SENOKOT-S) 8.6-50 MG tablet Take 1 tablet by mouth 2 (two) times daily. (Patient not taking: Reported on 01/13/2023) 30 tablet 0     thiamine (VITAMIN B-1) 100 MG tablet Take 1 tablet (100 mg total) by mouth daily. 30 tablet 5     Review of Systems  Constitutional:  Negative for chills and fever.  HENT:  Positive for sore throat.   Respiratory:  Negative for shortness of breath.   Cardiovascular:  Negative for chest pain.  Gastrointestinal:  Positive for abdominal pain, nausea and vomiting.  Genitourinary:  Negative for vaginal bleeding.  Neurological:  Positive for headaches.  All other systems reviewed and are negative.  Physical Exam   Blood pressure 133/71, pulse 79, temperature 98.9 F (37.2 C), temperature source Oral, resp. rate 17, height 5\' 9"  (1.753 m), weight 125.6 kg, last menstrual period 02/22/2023, SpO2 100%.  Physical Exam Vitals reviewed.  Constitutional:      Appearance: She is well-developed.  HENT:     Head: Normocephalic and atraumatic.  Eyes:     Extraocular Movements: Extraocular movements intact.  Cardiovascular:     Heart sounds: Normal heart sounds.  Pulmonary:     Effort: Pulmonary effort is normal.  Abdominal:     Palpations: Abdomen is soft.     Tenderness: There is abdominal tenderness (Left-sided muscular tenderness).  Musculoskeletal:     Cervical back: Normal range of motion.  Skin:    General: Skin is warm.  Neurological:     Mental Status: She is alert.     Cranial Nerves: No cranial nerve deficit.     MAU Course  Procedures  MDM CMP LR fluid bolus IV Zofran Scopolamine patch   Assessment and Plan  Katrina Stewart is a 28 yo G4P2 @[redacted]w[redacted]d  presenting for nausea and vomiting in early pregnancy  Nausea and vomiting in early pregnancy Has been having worsening nausea and vomiting throughout the pregnancy but got considerably worse over the last few days.  Taking Zofran without relief. - LR bolus given with good response - CMP within normal limits other than mild hyponatremia - IV Zofran given and patient reports also feeling better from that -  Scopolamine patch placed - All of patient's symptoms improved and patient discharged home with prescription for scopolamine patch, discussed Phenergan with the patient we will hold off on the prescription for now.  She will call the clinic if symptoms return. - Strict return precautions given   Celedonio Savage 05/05/2023, 11:48 AM

## 2023-05-09 NOTE — Progress Notes (Unsigned)
   PRENATAL VISIT NOTE  Subjective:  Katrina Stewart is a 28 y.o. [id number] at [redacted]w[redacted]d being seen today for ongoing prenatal care.  She is currently monitored for the following issues for this {Blank single:19197::"high-risk","low-risk"} pregnancy and has Hx of Chronic hypertension during pregnancy; Hx of Gestational diabetes mellitus (GDM), antepartum; Alpha thalassemia silent carrier; Supervision of high risk pregnancy in first trimester; and Rh negative state in antepartum period on their problem list.  Patient reports {sx:14538}.   .  .   . Denies leaking of fluid.   The following portions of the patient's history were reviewed and updated as appropriate: allergies, current medications, past family history, past medical history, past social history, past surgical history and problem list.   Objective:  There were no vitals filed for this visit.  Fetal Status:           General:  Alert, oriented and cooperative. Patient is in no acute distress.  Skin: Skin is warm and dry. No rash noted.   Cardiovascular: Normal heart rate noted  Respiratory: Normal respiratory effort, no problems with respiration noted  Abdomen: Soft, gravid, appropriate for gestational age.        Pelvic: {Blank single:19197::"Cervical exam performed in the presence of a chaperone","Cervical exam deferred"}        Extremities: Normal range of motion.     Mental Status: Normal mood and affect. Normal behavior. Normal judgment and thought content.   Assessment and Plan:  Pregnancy: Katrina Stewart at [redacted]w[redacted]d 1. Supervision of high risk pregnancy in first trimester (Primary) ***  2. [redacted] weeks gestation of pregnancy ***  3. Dichorionic diamniotic twin pregnancy in first trimester ***  {Blank single:19197::"Term","Preterm"} labor symptoms and general obstetric precautions including but not limited to vaginal bleeding, contractions, leaking of fluid and fetal movement were reviewed in detail with the patient. Please refer to  After Visit Summary for other counseling recommendations.   Return in about 4 weeks (around 06/07/2023) for HROB.  Future Appointments  Date Time Provider Department Center  05/10/2023  2:55 PM [facility]  05/12/2023  1:30 PM Lamar Sprinkles, MD [facility] None  07/07/2023  8:15 AM [facility] NURSE INTAKE [facility]  07/12/2023 10:00 AM [facility] PROVIDER 1 [facility]  07/12/2023 10:30 AM [facility] US1 [facility]    Richardson Landry, CNM

## 2023-05-10 ENCOUNTER — Other Ambulatory Visit: Payer: Self-pay

## 2023-05-10 ENCOUNTER — Other Ambulatory Visit (HOSPITAL_COMMUNITY)
Admission: RE | Admit: 2023-05-10 | Discharge: 2023-05-10 | Disposition: A | Discharge status: Home / Self Care | Source: Ambulatory Visit | Attending: Certified Nurse Midwife | Admitting: Certified Nurse Midwife

## 2023-05-10 ENCOUNTER — Ambulatory Visit: Payer: Self-pay | Admitting: Certified Nurse Midwife

## 2023-05-10 VITALS — BP 120/83 | HR 94 | Wt 276.2 lb

## 2023-05-10 DIAGNOSIS — Z3A11 11 weeks gestation of pregnancy: Secondary | ICD-10-CM | POA: Diagnosis not present

## 2023-05-10 DIAGNOSIS — O219 Vomiting of pregnancy, unspecified: Secondary | ICD-10-CM

## 2023-05-10 DIAGNOSIS — Z124 Encounter for screening for malignant neoplasm of cervix: Secondary | ICD-10-CM | POA: Insufficient documentation

## 2023-05-10 DIAGNOSIS — O30041 Twin pregnancy, dichorionic/diamniotic, first trimester: Secondary | ICD-10-CM

## 2023-05-10 DIAGNOSIS — O0991 Supervision of high risk pregnancy, unspecified, first trimester: Secondary | ICD-10-CM

## 2023-05-10 DIAGNOSIS — M79605 Pain in left leg: Secondary | ICD-10-CM

## 2023-05-10 MED ORDER — ASPIRIN 81 MG PO TBEC: 81.0000 mg | DELAYED_RELEASE_TABLET | Freq: Every day | ORAL | 12 refills | Status: DC

## 2023-05-10 MED ORDER — METOCLOPRAMIDE HCL 10 MG PO TABS
10.0000 mg | ORAL_TABLET | Freq: Four times a day (QID) | ORAL | 3 refills | Status: DC
Start: 2023-05-10 — End: 2023-08-31

## 2023-05-11 LAB — HEMOGLOBIN A1C
Est. average glucose Bld gHb Est-mCnc: 114 mg/dL
Hgb A1c MFr Bld: 5.6 % (ref 4.8–5.6)

## 2023-05-11 LAB — COMPREHENSIVE METABOLIC PANEL WITH GFR
ALT: 6 IU/L (ref 0–32)
AST: 11 IU/L (ref 0–40)
Albumin: 4.4 g/dL (ref 4.0–5.0)
Alkaline Phosphatase: 60 IU/L (ref 44–121)
BUN/Creatinine Ratio: 6 — ABNORMAL LOW (ref 9–23)
BUN: 5 mg/dL — ABNORMAL LOW (ref 6–20)
Bilirubin Total: 0.3 mg/dL (ref 0.0–1.2)
CO2: 18 mmol/L — ABNORMAL LOW (ref 20–29)
Calcium: 9.8 mg/dL (ref 8.7–10.2)
Chloride: 103 mmol/L (ref 96–106)
Creatinine, Ser: 0.77 mg/dL (ref 0.57–1.00)
Globulin, Total: 2.7 g/dL (ref 1.5–4.5)
Glucose: 78 mg/dL (ref 70–99)
Potassium: 4.1 mmol/L (ref 3.5–5.2)
Sodium: 137 mmol/L (ref 134–144)
Total Protein: 7.1 g/dL (ref 6.0–8.5)
eGFR: 107 mL/min/{1.73_m2} (ref >59)

## 2023-05-11 LAB — PROTEIN / CREATININE RATIO, URINE
Creatinine, Urine: 263.5 mg/dL
Protein, Ur: 14.9 mg/dL
Protein/Creat Ratio: 57 mg/g{creat} (ref 0–200)

## 2023-05-11 LAB — TSH RFX ON ABNORMAL TO FREE T4: TSH: 1.37 u[IU]/mL (ref 0.450–4.500)

## 2023-05-12 ENCOUNTER — Telehealth (HOSPITAL_COMMUNITY): Admitting: Student

## 2023-05-12 DIAGNOSIS — F319 Bipolar disorder, unspecified: Secondary | ICD-10-CM

## 2023-05-12 DIAGNOSIS — F1090 Alcohol use, unspecified, uncomplicated: Secondary | ICD-10-CM

## 2023-05-12 DIAGNOSIS — F3160 Bipolar disorder, current episode mixed, unspecified: Secondary | ICD-10-CM

## 2023-05-12 DIAGNOSIS — Z331 Pregnant state, incidental: Secondary | ICD-10-CM

## 2023-05-12 DIAGNOSIS — F4312 Post-traumatic stress disorder, chronic: Secondary | ICD-10-CM | POA: Diagnosis not present

## 2023-05-12 DIAGNOSIS — F411 Generalized anxiety disorder: Secondary | ICD-10-CM

## 2023-05-12 LAB — CBC/D/PLT+RPR+RH+ABO+RUBIGG...
Basophils Absolute: 0 10*3/uL (ref 0.0–0.2)
Basos: 1 %
EOS (ABSOLUTE): 0.2 10*3/uL (ref 0.0–0.4)
Eos: 3 %
HCV Ab: NONREACTIVE
HIV Screen 4th Generation wRfx: NONREACTIVE
Hematocrit: 36.5 % (ref 34.0–46.6)
Hemoglobin: 11.1 g/dL (ref 11.1–15.9)
Hepatitis B Surface Ag: NEGATIVE
Immature Grans (Abs): 0 10*3/uL (ref 0.0–0.1)
Immature Granulocytes: 1 %
Lymphocytes Absolute: 1.8 10*3/uL (ref 0.7–3.1)
Lymphs: 30 %
MCH: 21.6 pg — ABNORMAL LOW (ref 26.6–33.0)
MCHC: 30.4 g/dL — ABNORMAL LOW (ref 31.5–35.7)
MCV: 71 fL — ABNORMAL LOW (ref 79–97)
Monocytes Absolute: 0.4 10*3/uL (ref 0.1–0.9)
Monocytes: 7 %
Neutrophils Absolute: 3.7 10*3/uL (ref 1.4–7.0)
Neutrophils: 58 %
Platelets: 318 10*3/uL (ref 150–450)
RBC: 5.13 x10E6/uL (ref 3.77–5.28)
RDW: 15.6 % — ABNORMAL HIGH (ref 11.7–15.4)
RPR Ser Ql: NONREACTIVE
Rh Factor: NEGATIVE
Rubella Antibodies, IGG: 1.21 {index} (ref >0.99)
WBC: 6.1 10*3/uL (ref 3.4–10.8)

## 2023-05-12 LAB — AB SCR+ANTIBODY ID: Antibody Screen: POSITIVE — AB

## 2023-05-12 LAB — CYTOLOGY - PAP
Adequacy: ABSENT
Chlamydia: NEGATIVE
Comment: NEGATIVE
Comment: NEGATIVE
Comment: NORMAL
Diagnosis: NEGATIVE
High risk HPV: POSITIVE — AB
Neisseria Gonorrhea: NEGATIVE

## 2023-05-12 LAB — HCV INTERPRETATION

## 2023-05-12 LAB — CULTURE, OB URINE

## 2023-05-12 LAB — URINE CULTURE, OB REFLEX

## 2023-05-12 MED ORDER — QUETIAPINE FUMARATE 200 MG PO TABS: 200.0000 mg | ORAL_TABLET | Freq: Every day | ORAL | 1 refills | Status: DC

## 2023-05-12 MED ORDER — FLUOXETINE HCL 40 MG PO CAPS: 40.0000 mg | ORAL_CAPSULE | Freq: Every day | ORAL | 1 refills | Status: DC
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
## 2023-05-13 ENCOUNTER — Encounter (HOSPITAL_BASED_OUTPATIENT_CLINIC_OR_DEPARTMENT_OTHER): Payer: Self-pay | Admitting: Emergency Medicine

## 2023-05-13 ENCOUNTER — Other Ambulatory Visit: Payer: Self-pay

## 2023-05-13 ENCOUNTER — Emergency Department (HOSPITAL_BASED_OUTPATIENT_CLINIC_OR_DEPARTMENT_OTHER)
Admission: EM | Admit: 2023-05-13 | Discharge: 2023-05-13 | Disposition: A | Discharge status: Home / Self Care | Attending: Emergency Medicine | Admitting: Emergency Medicine

## 2023-05-13 ENCOUNTER — Ambulatory Visit
Admission: EM | Admit: 2023-05-13 | Discharge: 2023-05-13 | Disposition: A | Discharge status: Home / Self Care | Attending: Family Medicine | Admitting: Family Medicine

## 2023-05-13 ENCOUNTER — Emergency Department (HOSPITAL_BASED_OUTPATIENT_CLINIC_OR_DEPARTMENT_OTHER)

## 2023-05-13 DIAGNOSIS — Z7982 Long term (current) use of aspirin: Secondary | ICD-10-CM | POA: Insufficient documentation

## 2023-05-13 DIAGNOSIS — M79605 Pain in left leg: Secondary | ICD-10-CM

## 2023-05-13 DIAGNOSIS — I82812 Embolism and thrombosis of superficial veins of left lower extremities: Secondary | ICD-10-CM | POA: Insufficient documentation

## 2023-05-13 DIAGNOSIS — Z3A11 11 weeks gestation of pregnancy: Secondary | ICD-10-CM

## 2023-05-13 NOTE — ED Triage Notes (Signed)
 Left leg pain  Inner thigh to calf Constant X 1 week  + preg 11 weeks

## 2023-05-13 NOTE — ED Provider Notes (Signed)
 [place] EMERGENCY DEPARTMENT AT [facility] Provider Note   CSN: [id number] Arrival date & time: 05/13/23  1625     History  Chief Complaint  Patient presents with   Leg Pain    Katrina Stewart is a 28 y.o. female with no significant past medical history presents with concern for pain in her left leg that has been ongoing for the past week.  Pain is only medial side of her left thigh extending down into the medial side of her calf.  Reports the pain is constant.  Denies any numbness or tingling in her feet or any shooting pains.  Pain does not worsen with movement.  Denies any history of blood clots, any recent travel or immobilization, no recent surgeries.  She is currently pregnant.   Leg Pain      Home Medications Prior to Admission medications   Medication Sig Start Date End Date Taking? Authorizing Provider  acetaminophen (TYLENOL) 500 MG tablet Take 1,000 mg by mouth every 6 (six) hours as needed.    [provider]  aspirin EC 81 MG tablet Take 1 tablet (81 mg total) by mouth daily. Swallow whole. 05/10/23   Warren-Hill, Aviva Lemmings, CNM  Blood Pressure Monitoring (BLOOD PRESSURE KIT) DEVI 1 each by Does not apply route as needed. 05/03/23   Warren-Hill, Aviva Lemmings, CNM  FLUoxetine (PROZAC) 40 MG capsule Take 1 capsule (40 mg total) by mouth daily. 05/12/23 07/11/23  Shery Done, MD  folic acid (FOLVITE) 1 MG tablet Take 1 tablet (1 mg total) by mouth daily. Patient not taking: Reported on 05/12/2023 01/03/23   Regalado, Belkys A, MD  Iron, Ferrous Sulfate, 325 (65 Fe) MG TABS Take 325 mg by mouth 2 (two) times daily. Patient not taking: Reported on 05/12/2023 12/23/22   Abraham Abo, MD  metoCLOPramide (REGLAN) 10 MG tablet Take 1 tablet (10 mg total) by mouth 4 (four) times daily. 05/10/23   Salomon Cree, CNM  Misc. Devices (GOJJI WEIGHT SCALE) MISC 1 Device by Does not apply route as needed. 05/03/23   Warren-Hill, Aviva Lemmings, CNM  ondansetron (ZOFRAN-ODT) 4 MG  disintegrating tablet Take 1 tablet (4 mg total) by mouth every 8 (eight) hours as needed for nausea. 03/30/23   Granville Layer, MD  Prenatal Vit-Fe Fumarate-FA (MULTIVITAMIN-PRENATAL) 27-0.8 MG TABS tablet Take 1 tablet by mouth daily at 12 noon.    [provider]  QUEtiapine (SEROQUEL) 200 MG tablet Take 1 tablet (200 mg total) by mouth at bedtime. 05/12/23 07/11/23  Shery Done, MD  scopolamine (TRANSDERM-SCOP) 1 MG/3DAYS Place 1 patch (1.5 mg total) onto the skin every 3 (three) days. 05/08/23   Cresenzo, John V, MD      Allergies    Patient has no known allergies.    Review of Systems   Review of Systems  Musculoskeletal:        Left leg pain    Physical Exam Updated Vital Signs BP 133/85 (BP Location: Right Arm)   Pulse 96   Temp 97.7 F (36.5 C) (Oral)   Resp 18   LMP 02/22/2023   SpO2 100%  Physical Exam Vitals and nursing note reviewed.  Constitutional:      Appearance: Normal appearance.  HENT:     Head: Atraumatic.  Cardiovascular:     Comments: 2+ dorsalis pedis pulses bilaterally Pulmonary:     Effort: Pulmonary effort is normal.  Musculoskeletal:     Comments: Left lower extremity:  General Lower extremities appear grossly  symmetric bilaterally, hard to appreciate any difference due to body habitus. No obvious deformity. No erythema, edema, contusions, open wounds   Palpation Mild tenderness to palpation along the soft tissues of the medial aspect of the thigh and into the medial side of the calf.  Calf is soft and compressible. Non-tender of the popliteal fossa  ROM Full knee flexion and extension  Sensation: Sensation intact throughout the lower extremity  Strength: 5/5 strength with resisted knee flexion and extension  5/5 strength with resisted ankle plantarflexion and dorsiflexion   Neurological:     General: No focal deficit present.     Mental Status: She is alert.  Psychiatric:        Mood and Affect: Mood normal.         Behavior: Behavior normal.     ED Results / Procedures / Treatments   Labs (all labs ordered are listed, but only abnormal results are displayed) Labs Reviewed - No data to display  EKG None  Radiology US  Venous Img Lower  Left (DVT Study) Result Date: 05/13/2023 CLINICAL DATA:  Left thigh and calf pain for 1 EXAM: LEFT LOWER EXTREMITY VENOUS DOPPLER ULTRASOUND TECHNIQUE: Gray-scale sonography with compression, as well as color and duplex ultrasound, were performed to evaluate the deep venous system(s) from the level of the common femoral vein through the popliteal and proximal calf veins. COMPARISON:  None Available. FINDINGS: VENOUS There is occlusive superficial chest thrombus throughout the left greater saphenous vein just distal to the saphenofemoral junction. Normal compressibility of the common femoral, superficial femoral, and popliteal veins, as well as the visualized calf veins. Visualized portions of profunda femoral vein. No filling defects to suggest DVT on grayscale or color Doppler imaging. Doppler waveforms show normal direction of venous flow, normal respiratory plasticity and response to augmentation. Limited views of the contralateral common femoral vein are unremarkable. OTHER None. Limitations: none IMPRESSION: 1. No evidence of left lower extremity DVT. 2. Occlusive superficial thrombus throughout the left greater saphenous vein just distal to the saphenofemoral junction. Electronically Signed   By: Tyron Gallon M.D.   On: 05/13/2023 19:27    Procedures Procedures    Medications Ordered in ED Medications - No data to display  ED Course/ Medical Decision Making/ A&P                                 Medical Decision Making    Differential diagnosis includes but is not limited to DVT, radiculopathy, muscle strain, fracture, dislocation  ED Course:  Upon initial evaluation, patient is well-appearing, stable vital signs.  Reporting pain in the medial side of her  left leg from the thigh down to her calf.  No obvious deformity, erythema, edema on exam.  She denies any injury to the area, no bony tenderness palpation, indication for x-ray imaging.  She has full range of motion of the left lower extremity and is neurovascularly intact.  Tender to palpation of the soft tissues of the medial side of the thigh and calf.  Will proceed with ultrasound to evaluate for possible DVT. Upon reevaluation, patient still well-appearing.  Ultrasound did reveal superficial thrombus in the left greater saphenous vein.  Discussed that we will proceed with warm compresses, Tylenol for pain, and follow-up with her PCP. No indication for anticoagulation at this time given she has good pulses in the lower extremity, and this is a superficial thrombus likely from her pregnancy.  Imaging Studies ordered: I ordered imaging studies including left lower extremity ultrasound I independently visualized the imaging with scope of interpretation limited to determining acute life threatening conditions related to emergency care. Imaging showed no DVT, occlusive superficial thrombus throughout the left greater saphenous vein I agree with the radiologist interpretation   Impression: Superficial thrombus of the left greater saphenous vein  Disposition:  The patient was discharged home with instructions to  Return precautions given.    This chart was dictated using voice recognition software, Dragon. Despite the best efforts of this provider to proofread and correct errors, errors may still occur which can change documentation meaning.          Final Clinical Impression(s) / ED Diagnoses Final diagnoses:  Acute superficial venous thrombosis of lower extremity, left    Rx / DC Orders ED Discharge Orders     None         Rexie Catena, PA-C 05/13/23 2024    Mozell Arias, MD 05/14/23 1443

## 2023-05-13 NOTE — ED Provider Notes (Signed)
 [facility]    CSN: [id number] Arrival date & time: 05/13/23  1459      History   Chief Complaint No chief complaint on file.   HPI Katrina Stewart is a 28 y.o. female.   Patient is here for left leg pain x 1 week.  Pain is at the medial thigh/groin down to the medial calf.  She just woke up with pain.  Pain is constant.   Heating pad can help, but also uncomfortable to apply the pad.  No known injury.  No redness/swelling.  No fevers/chills.  No recent travel.  No chest pain or sob noted.  Currently pregnant, 11 weeks.  She took tylenol  without help.        Past Medical History:  Diagnosis Date   Acute pancreatitis 12/30/2022   Alcohol abuse 12/30/2022   Alcohol use disorder    Anxiety    Anxiety and depression 12/30/2022   Bipolar affective disorder, current episode mixed (HCC) 05/05/2021   Bipolar disorder (manic depression) (HCC) 02/12/2022   BV (bacterial vaginosis) 07/19/2022   Chronic hypertension during pregnancy 11/20/2018   [X]  Aspirin  81 mg daily after 12 weeks  Current antihypertensives:  None      Baseline and surveillance labs (pulled in from [facility], refresh links as needed)           Lab Results      Component    Value    Date           PLT    315    11/20/2018           CREATININE    0.62    11/20/2018           AST    38    11/20/2018           ALT    11    11/20/2018           PROTCRRATIO              10/19/202   Chronic post-traumatic stress disorder (PTSD) 10/04/2022   Class 3 severe obesity due to excess calories without serious comorbidity with body mass index (BMI) of 45.0 to 49.9 in adult (HCC) 05/05/2021   COVID-19 virus infection 12/05/2018   + again on 2/4 (asymptomatic)  postiive test in [facility]U on 11-2   Depression    Depression    Phreesia 12/17/2019   Depression    Phreesia 12/30/2019   Essential hypertension    Gestational diabetes mellitus (GDM)    Normal postpartum 2 hr GTT   Group B streptococcal infection in pregnancy  03/13/2019   History of depression 08/29/2018   History of gestational diabetes 10/03/2019   Iron  deficiency anemia 01/18/2023   Prediabetes 05/05/2021   Psychophysiological insomnia 05/05/2021   Rh negative state in antepartum period 09/03/2015   [x]  needs rhogam at 28 weeks  [name] postive anti-D on initial lab. Patient received rhogam on 08/02/18 during MAU visit   Rh negative state in antepartum period 09/03/2015   [x]  needs rhogam at 28 weeks     [name] postive anti-D on initial lab. Patient received rhogam on 08/02/18 during MAU visit     Rubella non-immune status, antepartum 09/03/2015   Severe episode of recurrent major depressive disorder, without psychotic features (HCC)    Suspected sleep apnea 01/18/2023   UTI (urinary tract infection)    Vaginal Pap smear, abnormal    Vitamin D  deficiency 01/18/2023    Patient  Active Problem List   Diagnosis Date Noted   Alpha thalassemia silent carrier 05/03/2023   Supervision of high risk pregnancy in first trimester 05/03/2023   Hx of Gestational diabetes mellitus (GDM), antepartum 01/08/2019   Hx of Chronic hypertension during pregnancy 11/20/2018   Rh negative state in antepartum period 09/03/2015    Past Surgical History:  Procedure Laterality Date   BREAST SURGERY  2013   Lt & Rt excision juvenile fibroadenoma   MASS EXCISION  10/29/2011   Procedure: EXCISION MASS;  Surgeon: Rogena Class, MD;  Location: [facility];  Service: General;  Laterality: Bilateral;  excision of bilateral breast masses    OB History     Gravida  4   Para  2   Term  2   Preterm      AB  1   Living  2      SAB  1   IAB  0   Ectopic      Multiple  0   Live Births  2            Home Medications    Prior to Admission medications   Medication Sig Start Date End Date Taking? Authorizing Provider  fluconazole  (DIFLUCAN ) 150 MG tablet Take 150 mg by mouth once. 01/12/23  Yes [provider]  acetaminophen  (TYLENOL )  500 MG tablet Take 1,000 mg by mouth every 6 (six) hours as needed.    [provider]  aspirin  EC 81 MG tablet Take 1 tablet (81 mg total) by mouth daily. Swallow whole. 05/10/23   Warren-Hill, Aviva Lemmings, CNM  Blood Pressure Monitoring (BLOOD PRESSURE KIT) DEVI 1 each by Does not apply route as needed. 05/03/23   Warren-Hill, Aviva Lemmings, CNM  cyclobenzaprine  (FLEXERIL ) 10 MG tablet Take 1 tablet (10 mg total) by mouth 2 (two) times daily as needed for muscle spasms. 04/05/23   Cooleen, Darren Em, NP  FLUoxetine  (PROZAC ) 40 MG capsule Take 1 capsule (40 mg total) by mouth daily. 05/12/23 07/11/23  Shery Done, MD  folic acid  (FOLVITE ) 1 MG tablet Take 1 tablet (1 mg total) by mouth daily. Patient not taking: Reported on 05/12/2023 01/03/23   Regalado, Clifford Dam A, MD  Iron , Ferrous Sulfate , 325 (65 Fe) MG TABS Take 325 mg by mouth 2 (two) times daily. Patient not taking: Reported on 05/12/2023 12/23/22   Abraham Abo, MD  metoCLOPramide  (REGLAN ) 10 MG tablet Take 1 tablet (10 mg total) by mouth 4 (four) times daily. 05/10/23   Salomon Cree, CNM  Misc. Devices (GOJJI WEIGHT SCALE) MISC 1 Device by Does not apply route as needed. 05/03/23   Warren-Hill, Aviva Lemmings, CNM  Multiple Vitamin (MULTIVITAMIN WITH MINERALS) TABS tablet Take 1 tablet by mouth daily. Patient not taking: Reported on 05/10/2023 01/03/23   Regalado, Clifford Dam A, MD  ondansetron  (ZOFRAN -ODT) 4 MG disintegrating tablet Take 1 tablet (4 mg total) by mouth every 8 (eight) hours as needed for nausea. 03/30/23   Granville Layer, MD  polyethylene glycol (MIRALAX ) 17 g packet Take 17 g by mouth daily. Patient not taking: Reported on 01/13/2023 01/02/23   Regalado, Clifford Dam A, MD  Prenatal Vit-Fe Fumarate-FA (MULTIVITAMIN-PRENATAL) 27-0.8 MG TABS tablet Take 1 tablet by mouth daily at 12 noon.    [provider]  QUEtiapine  (SEROQUEL ) 200 MG tablet Take 1 tablet (200 mg total) by mouth at bedtime. 05/12/23 07/11/23  Shery Done, MD  scopolamine   (TRANSDERM-SCOP) 1 MG/3DAYS Place 1 patch (1.5 mg total) onto  the skin every 3 (three) days. 05/08/23   Cresenzo, John V, MD  senna-docusate (SENOKOT-S) 8.6-50 MG tablet Take 1 tablet by mouth 2 (two) times daily. Patient not taking: Reported on 01/13/2023 01/02/23   Regalado, Clifford Dam A, MD  thiamine  (VITAMIN B-1) 100 MG tablet Take 1 tablet (100 mg total) by mouth daily. Patient not taking: Reported on 05/10/2023 02/17/23 08/16/23  Adell Hones    Family History Family History  Problem Relation Age of Onset   Diabetes Mother    Hypertension Mother    Other Father        doesn't know what he died from   Asthma Brother    Cancer Maternal Grandmother        lung cancer   Cancer Other        bladder cancer    Social History Social History   Tobacco Use   Smoking status: Never    Passive exposure: Never   Smokeless tobacco: Never  Vaping Use   Vaping status: Never Used  Substance Use Topics   Alcohol use: Not Currently    Alcohol/week: 2.0 - 3.0 standard drinks of alcohol    Types: 2 - 3 Shots of liquor per week    Comment: occasional   Drug use: No     Allergies   Patient has no known allergies.   Review of Systems Review of Systems  Constitutional: Negative.   HENT: Negative.    Respiratory: Negative.    Cardiovascular: Negative.   Gastrointestinal: Negative.   Musculoskeletal:  Positive for gait problem.     Physical Exam Triage Vital Signs ED Triage Vitals  Encounter Vitals Group     BP      Systolic BP Percentile      Diastolic BP Percentile      Pulse      Resp      Temp      Temp src      SpO2      Weight      Height      Head Circumference      Peak Flow      Pain Score      Pain Loc      Pain Education      Exclude from Growth Chart    No data found.  Updated Vital Signs LMP 02/22/2023   Visual Acuity Right Eye Distance:   Left Eye Distance:   Bilateral Distance:    Right Eye Near:   Left Eye Near:    Bilateral Near:      Physical Exam Constitutional:      Appearance: Normal appearance. She is normal weight.  Cardiovascular:     Rate and Rhythm: Normal rate and regular rhythm.  Pulmonary:     Effort: Pulmonary effort is normal.     Breath sounds: Normal breath sounds.  Musculoskeletal:     Comments: No obvious deformity to the left medial thigh or lower leg;  she has TTP to the medial left thigh to palpation;  there is an area at the medial proximal lower leg that is tender, feels like an area of fullness, rope like vein  Neurological:     General: No focal deficit present.     Mental Status: She is alert.  Psychiatric:        Mood and Affect: Mood normal.      UC Treatments / Results  Labs (all labs ordered are listed, but only abnormal results are displayed) Labs  Reviewed - No data to display  EKG   Radiology No results found.  Procedures Procedures (including critical care time)  Medications Ordered in UC Medications - No data to display  Initial Impression / Assessment and Plan / UC Course  I have reviewed the triage vital signs and the nursing notes.  Pertinent labs & imaging results that were available during my care of the patient were reviewed by me and considered in my medical decision making (see chart for details).  Patient is here today for left medial leg pain.  She is currently [redacted] weeks pregnant as well.  On exam, there is a superficial tender area to the medial lower leg.  Concern for superficial thrombophlebitis.  Given it is late in the day on Friday, I have recommended she go to the ER for further evaluation and possible ultrasound today.  She is aware and agrees.    Final Clinical Impressions(s) / UC Diagnoses   Final diagnoses:  Leg pain, medial, left  [redacted] weeks gestation of pregnancy     Discharge Instructions      You were seen today for left leg pain.  I recommend you get an ultrasound of your leg.  Please go to the ER at [facility] to be seen today for  this.      ED Prescriptions   None    PDMP not reviewed this encounter.   Lesle Ras, MD 05/23/23 [phone]

## 2023-05-13 NOTE — ED Triage Notes (Signed)
 Pt reports left leg pain (points to inner mid thigh to inner mid calf) x 1 weeks. Denies injury. She has tried tylenol without relief. Denies recent travel. She is pregnant with EDD 11/29/2023

## 2023-05-13 NOTE — ED Notes (Addendum)
 Patient is being discharged from the Urgent Care and sent to the Emergency Department via [facility] . Per Dr. Haynes Bast, patient is in need of higher level of care due to possible DVT. Patient is aware and verbalizes understanding of plan of care.  Vitals:   05/13/23 1518  BP: 117/78  Pulse: 98  Resp: 18  Temp: 98.5 F (36.9 C)  SpO2: 98%

## 2023-05-13 NOTE — Discharge Instructions (Addendum)
 You were seen today for left leg pain.  I recommend you get an ultrasound of your leg.  Please go to the ER at [facility] to be seen today for this.

## 2023-05-13 NOTE — Discharge Instructions (Signed)
 As discussed, you have a blood clot in a superficial vein in your left leg.  This does not require any blood thinners at this time.  Please apply warm compresses to the leg to help with pain and inflammation.  You may take up to 1000mg  of tylenol every 6 hours as needed for pain.  Do not take more then 4g per day.  Please engage in gentle physical activity to help promote blood flow and prevent formation of clots.  Please make your PCP aware of the finding today and follow-up with them within the next 2 weeks for recheck of your symptoms.  I have included the ultrasound results below for your reference.  Return to the ER for any severe worsening of your pain, numbness or tingling in your foot, shortness of breath or chest pain, any other new or concerning symptoms.  "EXAM: LEFT LOWER EXTREMITY VENOUS DOPPLER ULTRASOUND   TECHNIQUE: Gray-scale sonography with compression, as well as color and duplex ultrasound, were performed to evaluate the deep venous system(s) from the level of the common femoral vein through the popliteal and proximal calf veins.   COMPARISON:  None Available.   FINDINGS: VENOUS   There is occlusive superficial chest thrombus throughout the left greater saphenous vein just distal to the saphenofemoral junction.   Normal compressibility of the common femoral, superficial femoral, and popliteal veins, as well as the visualized calf veins. Visualized portions of profunda femoral vein. No filling defects to suggest DVT on grayscale or color Doppler imaging. Doppler waveforms show normal direction of venous flow, normal respiratory plasticity and response to augmentation.   Limited views of the contralateral common femoral vein are unremarkable.   OTHER   None.   Limitations: none   IMPRESSION: 1. No evidence of left lower extremity DVT. 2. Occlusive superficial thrombus throughout the left greater saphenous vein just distal to the saphenofemoral  junction.     Electronically Signed   By: Darliss Cheney M.D.   On: 05/13/2023 19:27"

## 2023-05-16 ENCOUNTER — Encounter: Payer: Self-pay | Admitting: *Deleted

## 2023-05-17 ENCOUNTER — Encounter (HOSPITAL_COMMUNITY): Payer: Self-pay | Admitting: Student

## 2023-05-17 ENCOUNTER — Encounter: Payer: Self-pay | Admitting: Certified Nurse Midwife

## 2023-05-17 DIAGNOSIS — F411 Generalized anxiety disorder: Secondary | ICD-10-CM | POA: Insufficient documentation

## 2023-05-17 LAB — PANORAMA PRENATAL TEST FULL PANEL:PANORAMA TEST PLUS 5 ADDITIONAL MICRODELETIONS
FETAL FRACTION SECOND FETUS: 2.2
FETAL FRACTION: 3.6

## 2023-05-19 ENCOUNTER — Other Ambulatory Visit: Payer: Self-pay

## 2023-05-19 ENCOUNTER — Other Ambulatory Visit

## 2023-05-19 DIAGNOSIS — O0991 Supervision of high risk pregnancy, unspecified, first trimester: Secondary | ICD-10-CM

## 2023-05-20 LAB — GLUCOSE TOLERANCE, 2 HOURS W/ 1HR
Glucose, 1 hour: 138 mg/dL (ref 70–179)
Glucose, 2 hour: 101 mg/dL (ref 70–152)
Glucose, Fasting: 79 mg/dL (ref 70–91)

## 2023-05-24 ENCOUNTER — Encounter: Payer: Self-pay | Admitting: Obstetrics & Gynecology

## 2023-05-25 ENCOUNTER — Encounter: Payer: Self-pay | Admitting: Obstetrics & Gynecology

## 2023-05-26 ENCOUNTER — Encounter: Payer: Self-pay | Admitting: Certified Nurse Midwife

## 2023-05-26 LAB — PANORAMA PRENATAL TEST FULL PANEL:PANORAMA TEST PLUS 5 ADDITIONAL MICRODELETIONS
FETAL FRACTION SECOND FETUS: 1.9
FETAL FRACTION: 3.2

## 2023-05-26 NOTE — Progress Notes (Signed)
 Patient needs to repeat Panorama for her twin pregnancy due to insufficient fetal DNA.  Please call to inform patient of results and recommendations.  Lenoard Rad, MD

## 2023-05-27 NOTE — Progress Notes (Signed)
 Message sent via MyChart.    Ilianna Bown,RN

## 2023-05-31 ENCOUNTER — Ambulatory Visit (INDEPENDENT_AMBULATORY_CARE_PROVIDER_SITE_OTHER)

## 2023-05-31 DIAGNOSIS — F1090 Alcohol use, unspecified, uncomplicated: Secondary | ICD-10-CM

## 2023-05-31 DIAGNOSIS — F411 Generalized anxiety disorder: Secondary | ICD-10-CM

## 2023-05-31 DIAGNOSIS — F4312 Post-traumatic stress disorder, chronic: Secondary | ICD-10-CM

## 2023-05-31 NOTE — Progress Notes (Signed)
 THERAPIST PROGRESS NOTE  Session Time: 3:01 pm to 4:00 pm  Virtual Visit via Video Note   I connected with Katrina Stewart  at 3:01 pm  EST by a video enabled telemedicine application and verified that I am speaking with the correct person using two identifiers.   Location: Patient: home Provider: [address] [place]   I discussed the limitations of evaluation and management by telemedicine and the availability of in person appointments. The patient expressed understanding and agreed to proceed.   Type of Therapy: Individual   Therapist Response/Interventions: CBT/provide psycho-education on addiction as a brain disease,discussed developing distraction and coping skills to deal with triggers to drinking, provide motivational interviewing, discuss improvements noted to mood stability without alcohol and discussed benefits of continued abstinence  Treatment Goals addressed:Template: Depression (OP) Outpatient Treatment Plan 01/06/23  Problem: OP Depression Dates: Start: 01/06/23 Disciplines: Interdisciplinary, PROVIDER Lab Results Component Value Date PLT 315 11/20/2018 CREATININE 0.62 11/20/2018 AST 38 11/20/2018 ALT 11 11/20/2018 PROTCRRATIO 11/20/2018 Goal: Katrina will report a reduction in her anxiety and mood sx as evidenced by her PHQ-9 and GAD- Page 1 of 2 Template: Depression (OP) (continued) Current Medications FLUoxetine  (PROZAC ) 20 MG capsule folic acid  (FOLVITE ) 1 MG tablet Iron , Ferrous Sulfate , 325 (65 Fe) MG TABS Multiple Vitamin (MULTIVITAMIN WITH MINERALS) TABS tablet ondansetron  (ZOFRAN ) 4 MG tablet phentermine  (ADIPEX-P ) 37.5 MG tablet polyethylene glycol (MIRALAX ) 17 g packet QUEtiapine  (SEROQUEL ) 300 MG tablet senna-docusate (SENOKOT-S) 8.6-50 MG tablet thiamine  (VITAMIN B-1) 100 MG tablet her GAD-7 as being less than a 4. Dates: Start: 01/06/23 Expected End: 07/07/23 Disciplines: Interdisciplinary, PROVIDER Goal: Katrina will abstain from alcohol daily  per self-report per self-report and breathalyzer if indicated Dates: Start: 01/06/23 Expected End: 07/07/23 Disciplines: Interdisciplinary, PROVIDER Intervention: Therapist will assist Katrina in identifying and changing thourghts and behaviors that contribute to feelings or anxiety and unstable mood. Dates: Start: 01/06/23 Intervention: Therapist will educate Katrina about SUD's patterns and consequences of use, relapse risks, the treatment process and types of mutual-support group and provide early recovery coping and relapse prevention skills. Dates: Start: 01/06/23 Description: Katrina Stewart provided verbal permission for this therapist to sign her Care Plan electronically  Summary: Katrina Stewart presents today for virtual therapy.  Katrina reports she stopped drinking alcohol when she got pregnant. She says she is expecting twins. She says she is not sure if she will resume drinking after the twins are born. She says she drinks to deal with frustration. She says she may just try to not drink as much.  Upon providing psycho-education of addiction as a disease, Katrina says she would be willing to try distraction/coping skills to deal with emotional triggers, particularly frustration.  She agrees to begin counting and grounding techniques to deal with emotional triggers while she is not currently drinking, discuss cravings as waves and how to wait them out, Therapist will continue motivational interviewing, psycho-education and processing emotions to hopefully move Katrina from the pre-contemplation to the active stage of change.   Progress Towards Goals: Minimal progress  Suicidal/Homicidal: denies  Plan: Return again virtually on  06-28-23 at 2pm.  Diagnosis: Post Traumatic Stress Disorder Generalized Anxiety Disorder Alcohol Use Disorder, Severe, Dependence Bipolar Affective Disorder  Collaboration of Care: N/A  Patient/Guardian was advised Release of Information must be obtained prior to any record release in  order to collaborate their care with an outside provider. Patient/Guardian was advised if they have not already done so to contact the registration department to sign all necessary forms in  order for us  to release information regarding their care.   Consent: Patient/Guardian gives verbal consent for treatment and assignment of benefits for services provided during this visit. Patient/Guardian expressed understanding and agreed to proceed.   Earnie Gola, LMFT, LCAS 05-31-23

## 2023-06-06 ENCOUNTER — Other Ambulatory Visit: Payer: Self-pay

## 2023-06-06 ENCOUNTER — Ambulatory Visit: Payer: Self-pay | Admitting: Obstetrics & Gynecology

## 2023-06-06 VITALS — BP 117/81 | HR 89 | Wt 272.0 lb

## 2023-06-06 DIAGNOSIS — O10912 Unspecified pre-existing hypertension complicating pregnancy, second trimester: Secondary | ICD-10-CM | POA: Diagnosis not present

## 2023-06-06 DIAGNOSIS — O30042 Twin pregnancy, dichorionic/diamniotic, second trimester: Secondary | ICD-10-CM | POA: Diagnosis not present

## 2023-06-06 DIAGNOSIS — O30049 Twin pregnancy, dichorionic/diamniotic, unspecified trimester: Secondary | ICD-10-CM | POA: Insufficient documentation

## 2023-06-06 DIAGNOSIS — O219 Vomiting of pregnancy, unspecified: Secondary | ICD-10-CM

## 2023-06-06 DIAGNOSIS — O0992 Supervision of high risk pregnancy, unspecified, second trimester: Secondary | ICD-10-CM

## 2023-06-06 DIAGNOSIS — O10919 Unspecified pre-existing hypertension complicating pregnancy, unspecified trimester: Secondary | ICD-10-CM

## 2023-06-06 DIAGNOSIS — O0991 Supervision of high risk pregnancy, unspecified, first trimester: Secondary | ICD-10-CM

## 2023-06-06 DIAGNOSIS — Z3A14 14 weeks gestation of pregnancy: Secondary | ICD-10-CM

## 2023-06-06 DIAGNOSIS — O99012 Anemia complicating pregnancy, second trimester: Secondary | ICD-10-CM

## 2023-06-06 MED ORDER — PROMETHAZINE HCL 25 MG PO TABS: 25.0000 mg | ORAL_TABLET | Freq: Four times a day (QID) | ORAL | 0 refills | Status: DC | PRN

## 2023-06-06 NOTE — Progress Notes (Signed)
   PRENATAL VISIT NOTE  Subjective:  Katrina Stewart is a 28 y.o. [id number] at [redacted]w[redacted]d being seen today for ongoing prenatal care.  She is currently monitored for the following issues for this high-risk pregnancy and has Hx of Chronic hypertension during pregnancy; Hx of Gestational diabetes mellitus (GDM), antepartum; Bipolar affective disorder, current episode mixed (HCC); Chronic post-traumatic stress disorder (PTSD); Alpha thalassemia silent carrier; Supervision of high-risk pregnancy; Rh negative state in antepartum period; Generalized anxiety disorder; and Dichorionic diamniotic twin pregnancy on their problem list.  Patient reports nausea and vomiting, wants other medication in addition to Zofran  and Reglan ..  Contractions: Not present. Vag. Bleeding: None.  Movement: Present. Denies leaking of fluid.   The following portions of the patient's history were reviewed and updated as appropriate: allergies, current medications, past family history, past medical history, past social history, past surgical history and problem list.   Objective:   Vitals:   06/06/23 1521 06/06/23 1547  BP: (!) 140/84 117/81  Pulse: 89 89  Weight: 272 lb (123.4 kg)     Fetal Status: Fetal Heart Rate (bpm): 164/150 (doppler and handheld US )   Movement: Present     General:  Alert, oriented and cooperative. Patient is in no acute distress.  Skin: Skin is warm and dry. No rash noted.   Cardiovascular: Normal heart rate noted  Respiratory: Normal respiratory effort, no problems with respiration noted  Abdomen: Soft, gravid, appropriate for gestational age.  Pain/Pressure: Absent     Pelvic: Cervical exam deferred        Extremities: Normal range of motion.  Edema: None  Mental Status: Normal mood and affect. Normal behavior. Normal judgment and thought content.   Assessment and Plan:  Pregnancy: [id number] at [redacted]w[redacted]d 1. Dichorionic diamniotic twin pregnancy in second trimester Detailed anatomy scan scheduled for  patient.  2. Hx of Chronic hypertension during pregnancy Elevated BP initially, then normal BP later.  Continue close monitoring. Continue ASA.  3. Nausea and vomiting in pregnancy - promethazine  (PHENERGAN ) 25 MG tablet; Take 1 tablet (25 mg total) by mouth every 6 (six) hours as needed for nausea or vomiting.  Dispense: 30 tablet; Refill: 0 - Comp Met (CMET)  4. [redacted] weeks gestation of pregnancy 5. Supervision of high risk pregnancy in first trimester (Primary) - PANORAMA PRENATAL TEST No other complaints or concerns.  Routine obstetric precautions reviewed.  Return in about 4 weeks (around 07/04/2023) for OFFICE OB VISIT (MD only).  Future Appointments  Date Time Provider Department Center  06/24/2023  9:30 AM Shery Done, MD [facility] None  06/28/2023  2:00 PM Dayla Eva [facility] None  07/12/2023 10:00 AM [facility] PROVIDER 1 [facility]  07/12/2023 10:30 AM [facility] US1 [facility]    Lenoard Rad, MD

## 2023-06-07 ENCOUNTER — Encounter: Payer: Self-pay | Admitting: Obstetrics & Gynecology

## 2023-06-07 DIAGNOSIS — O99012 Anemia complicating pregnancy, second trimester: Secondary | ICD-10-CM | POA: Insufficient documentation

## 2023-06-07 LAB — COMPREHENSIVE METABOLIC PANEL WITH GFR
ALT: 6 IU/L (ref 0–32)
AST: 8 IU/L (ref 0–40)
Albumin: 4.1 g/dL (ref 4.0–5.0)
Alkaline Phosphatase: 61 IU/L (ref 44–121)
BUN/Creatinine Ratio: 6 — ABNORMAL LOW (ref 9–23)
BUN: 4 mg/dL — ABNORMAL LOW (ref 6–20)
Bilirubin Total: 0.3 mg/dL (ref 0.0–1.2)
CO2: 17 mmol/L — ABNORMAL LOW (ref 20–29)
Calcium: 9.7 mg/dL (ref 8.7–10.2)
Chloride: 103 mmol/L (ref 96–106)
Creatinine, Ser: 0.68 mg/dL (ref 0.57–1.00)
Globulin, Total: 2.8 g/dL (ref 1.5–4.5)
Glucose: 78 mg/dL (ref 70–99)
Potassium: 3.9 mmol/L (ref 3.5–5.2)
Sodium: 135 mmol/L (ref 134–144)
Total Protein: 6.9 g/dL (ref 6.0–8.5)
eGFR: 121 mL/min/{1.73_m2} (ref >59)

## 2023-06-07 LAB — CBC
Hematocrit: 34.4 % (ref 34.0–46.6)
Hemoglobin: 10.6 g/dL — ABNORMAL LOW (ref 11.1–15.9)
MCH: 21.9 pg — ABNORMAL LOW (ref 26.6–33.0)
MCHC: 30.8 g/dL — ABNORMAL LOW (ref 31.5–35.7)
MCV: 71 fL — ABNORMAL LOW (ref 79–97)
Platelets: 320 10*3/uL (ref 150–450)
RBC: 4.83 x10E6/uL (ref 3.77–5.28)
RDW: 16.3 % — ABNORMAL HIGH (ref 11.7–15.4)
WBC: 6.5 10*3/uL (ref 3.4–10.8)

## 2023-06-07 MED ORDER — FERRIC MALTOL 30 MG PO CAPS: 1.0000 | ORAL_CAPSULE | Freq: Two times a day (BID) | ORAL | 10 refills | Status: DC

## 2023-06-07 NOTE — Addendum Note (Signed)
 Addended by: Lenoard Rad A on: 06/07/2023 09:59 AM   Modules accepted: Orders

## 2023-06-08 LAB — PROTEIN / CREATININE RATIO, URINE
Creatinine, Urine: 272.4 mg/dL
Protein, Ur: 14.2 mg/dL
Protein/Creat Ratio: 52 mg/g{creat} (ref 0–200)

## 2023-06-09 ENCOUNTER — Encounter: Payer: Self-pay | Admitting: Obstetrics & Gynecology

## 2023-06-10 ENCOUNTER — Encounter: Payer: Self-pay | Admitting: Obstetrics & Gynecology

## 2023-06-10 MED ORDER — ASPIRIN 81 MG PO TBEC: 162.0000 mg | DELAYED_RELEASE_TABLET | Freq: Every day | ORAL | 2 refills | Status: DC

## 2023-06-10 NOTE — Addendum Note (Signed)
 Addended by: Lenoard Rad A on: 06/10/2023 10:10 PM   Modules accepted: Orders

## 2023-06-13 ENCOUNTER — Other Ambulatory Visit: Payer: Self-pay | Admitting: Family Medicine

## 2023-06-13 LAB — PANORAMA PRENATAL TEST FULL PANEL:PANORAMA TEST PLUS 5 ADDITIONAL MICRODELETIONS
FETAL FRACTION SECOND FETUS: 2
FETAL FRACTION: 5

## 2023-06-14 ENCOUNTER — Encounter: Payer: Self-pay | Admitting: Obstetrics & Gynecology

## 2023-06-15 ENCOUNTER — Ambulatory Visit: Payer: Self-pay | Admitting: Obstetrics & Gynecology

## 2023-06-15 DIAGNOSIS — O0992 Supervision of high risk pregnancy, unspecified, second trimester: Secondary | ICD-10-CM

## 2023-06-15 NOTE — Progress Notes (Signed)
 Patient notified via MyChart.    Hershell Brandl,RN

## 2023-06-15 NOTE — Progress Notes (Signed)
 Inadequate fetal fraction on the Panorama for the third time, repeat not recommended. Follow up with MFM detailed scan, can have genetic consultation prior to scan given Panorama results.  Please call to inform patient of results and recommendations.   Lenoard Rad, MD

## 2023-06-20 ENCOUNTER — Encounter: Payer: Self-pay | Admitting: Obstetrics & Gynecology

## 2023-06-20 ENCOUNTER — Other Ambulatory Visit: Payer: Self-pay

## 2023-06-20 DIAGNOSIS — O0992 Supervision of high risk pregnancy, unspecified, second trimester: Secondary | ICD-10-CM

## 2023-06-20 MED ORDER — ONDANSETRON HCL 4 MG PO TABS: 4.0000 mg | ORAL_TABLET | Freq: Three times a day (TID) | ORAL | 0 refills | Status: DC | PRN

## 2023-06-22 ENCOUNTER — Encounter (HOSPITAL_COMMUNITY): Payer: Self-pay | Admitting: Obstetrics and Gynecology

## 2023-06-22 ENCOUNTER — Inpatient Hospital Stay (HOSPITAL_COMMUNITY)
Admission: AD | Admit: 2023-06-22 | Discharge: 2023-06-22 | Disposition: A | Discharge status: Home / Self Care | Attending: Obstetrics and Gynecology | Admitting: Obstetrics and Gynecology

## 2023-06-22 DIAGNOSIS — Z3A17 17 weeks gestation of pregnancy: Secondary | ICD-10-CM | POA: Diagnosis not present

## 2023-06-22 DIAGNOSIS — O26892 Other specified pregnancy related conditions, second trimester: Secondary | ICD-10-CM | POA: Insufficient documentation

## 2023-06-22 DIAGNOSIS — O368190 Decreased fetal movements, unspecified trimester, not applicable or unspecified: Secondary | ICD-10-CM

## 2023-06-22 DIAGNOSIS — Z8616 Personal history of COVID-19: Secondary | ICD-10-CM | POA: Diagnosis not present

## 2023-06-22 DIAGNOSIS — O99012 Anemia complicating pregnancy, second trimester: Secondary | ICD-10-CM

## 2023-06-22 DIAGNOSIS — R0602 Shortness of breath: Secondary | ICD-10-CM

## 2023-06-22 DIAGNOSIS — O30042 Twin pregnancy, dichorionic/diamniotic, second trimester: Secondary | ICD-10-CM | POA: Diagnosis not present

## 2023-06-22 DIAGNOSIS — O368120 Decreased fetal movements, second trimester, not applicable or unspecified: Secondary | ICD-10-CM

## 2023-06-22 LAB — URINALYSIS, ROUTINE W REFLEX MICROSCOPIC
Bilirubin Urine: NEGATIVE
Glucose, UA: NEGATIVE mg/dL
Ketones, ur: NEGATIVE mg/dL
Leukocytes,Ua: NEGATIVE
Nitrite: NEGATIVE
Protein, ur: NEGATIVE mg/dL
Specific Gravity, Urine: 1.017 (ref 1.005–1.030)
pH: 5 (ref 5.0–8.0)

## 2023-06-22 NOTE — Discharge Instructions (Signed)
Commonly Asked Questions During Pregnancy  How Will I Feel When I'm Pregnant? Pregnancy symptoms in the first trimester of pregnancy may not appear until the middle or end of the second month. Hormonal changes will cause tenderness in your breasts, and you may begin to feel more tired than usual. Food cravings, an increase in the need to urinate, and morning sickness may all be more noticeable.  Pregnancy symptoms in the second trimester are more prominent. You may start to feel the baby move and become more active. Dental issues, nasal/sinus problems, and skin irritations can begin to appear. Heartburn, leg cramps, dizziness, and a vaginal discharge are also common. Every woman is different when it comes to the symptoms they experience, and some may not experience any at all. Pregnancy symptoms in the third trimester can include increased frequency in urination, leg cramps, constipation, ligament pain in the abdomen, and weight gain. Back pain and Braxton Hicks contractions will become increasingly more common.  Why is nutrition during pregnancy important? Eating well is one of the best things you can do during pregnancy. Good nutrition helps you handle the extra demands on your body as your pregnancy progresses. The goal is to balance getting enough nutrients to support the growth of your fetus and maintaining a healthy weight.  How much water should I drink during pregnancy? During pregnancy you should drink 8 to 12 cups (64 to 96 ounces) of water every day. Water has many benefits. It aids digestion and helps form the amniotic fluid around the fetus. Water also helps nutrients circulate in the body and helps waste leave the body.  What can I do to help with nausea? Eat dry toast or crackers in the morning before you get out of bed to avoid moving around on an empty stomach. Eat five or six "mini meals" a day to ensure that your stomach is never empty. Eat frequent bites of foods like nuts,  fruits, or crackers.  What can help with constipation during pregnancy? Constipation is common near the end of pregnancy. Eating more foods with fiber can help fight constipation. Fiber is found in fruits, vegetables, whole grains, beans, nuts, and seeds. You should aim for about 25 grams of fiber in your diet each day. Drink a lot of water as you increase your fiber intake.  How much coffee can I drink while I'm pregnant? Research suggests that moderate caffeine consumption (less than 200 milligrams per day) does not cause miscarriage or preterm birth. That's the amount in one 12-ounce cup of coffee. Remember that caffeine also is found in tea, chocolate, energy drinks, and soft drinks. Caffeine can interfere with sleep and contribute to nausea and light-headedness. Caffeine also can increase urination and lead to dehydration.  What can I do to prevent or ease back pain during pregnancy? There are several things you can do to prevent or ease back pain. For example, wear supportive clothing and shoes. Pay attention to your position when sitting, sleeping, and lifting things. If you need to stand for a long time, rest one foot on a stool or a box to take the strain off your back. You also can use heat or cold to soothe sore muscles.  Is it safe to exercise during pregnancy? If you are healthy and your pregnancy is normal, it is safe to continue or start regular physical activity. Physical activity does not increase your risk of miscarriage, low birth weight, or early delivery. It's still important to discuss exercise with your ob-gyn  provider during your early prenatal visits.   What are the benefits of exercise during pregnancy? Regular exercise during pregnancy benefits you and your fetus in these key ways: Reduces back pain Eases constipation May decrease your risk of gestational diabetes, preeclampsia, and cesarean birth Promotes healthy weight gain during pregnancy Improves your overall  fitness and strengthens your heart and blood vessels Helps you to lose the baby weight after your baby is born  Is it safe to dye my hair during pregnancy? Yes, it's safe. Only a small amount of chemicals from hair dye is absorbed through the scalp.  Is it safe to keep a cat during pregnancy? Yes, you can keep your cat. You may have heard that cat feces can carry the infection toxoplasmosis. This infection is only found in cats who go outdoors and hunt prey, such as mice and other rodents. If you do have a cat who goes outdoors or eats prey, have someone else take over daily cleaning the litter box. This will keep you away from any cat feces. If you have an indoor cat who only eats cat food and doesn't have contact with outside animals, your risk of toxoplasmosis is very low.  What substances should I avoid during pregnancy? During pregnancy, women should not use tobacco, alcohol, marijuana, illegal drugs, or prescription medications for nonmedical reasons. Avoiding these substances and getting regular prenatal care are important to having a healthy pregnancy and a healthy baby.   What foods to I need to avoid in pregnancy? To help prevent listeriosis, avoid eating the following foods while you are pregnant: Unpasteurized milk and foods made with unpasteurized milk, including soft cheeses Hot dogs and luncheon meats, unless they are heated until steaming hot just before serving Unwashed raw produce such as fruits and vegetables  Avoid all raw and undercooked seafood, eggs, meat, and poultry while you are pregnant. Do not eat sushi made with raw fish (cooked sushi is safe). Cooking and pasteurization are the only ways to kill Listeria.  Limit your exposure to mercury by not eating bigeye tuna, king mackerel, marlin, orange roughy, shark, swordfish, or tilefish. Limit eating white (albacore) tuna to 6 ounces a week. You do not have to avoid all fish during pregnancy. In fact, fish and shellfish are  nutritious foods with vital nutrients for a pregnant woman and her fetus. Be sure to eat at least 8-12 ounces of low-mercury fish and shellfish per week.  Is travel safe to during pregnancy? In most cases, pregnant women can travel safely until close to their due dates. But travel may not be recommended for women who have pregnancy complications. If you are planning a trip, talk with your (ob-gyn) provider. And no matter how you choose to travel, think ahead about your comfort and safety.  Can I use a sauna or hot tub early in pregnancy? It's best not to. Your core body temperature rises when you use saunas and hot tubs. This rise in temperature can be harmful for your fetus.  Can I get a massage while pregnant? Yes. Massage is a good way to relax and improve circulation. The best position for a massage while you're pregnant is lying on your side, rather than facedown. Some massage tables have a cut-out for the belly, allowing you to lie facedown comfortably. Tell your massage therapist that you're pregnant if you're not showing yet. Many health spas offer special prenatal massages done by therapists who are trained to work on pregnant women.  Is Having Dental  Work While Pregnant Safe? Pregnancy and dental work questions are common for expecting moms. Preventive dental cleanings and annual exams during pregnancy are not only safe but are recommended. The rise in hormone levels during pregnancy causes the gums to swell, bleed, and trap food causing increased irritation to your gums. Preventive dental work while pregnant is essential to avoid oral infections such as gum disease, which has been linked to preterm birth. The American Dental Association (ADA) recommends pregnant women eat a balanced diet, brush their teeth thoroughly with ADA-approved fluoride toothpaste twice a day, and floss daily. Have preventive exams and cleanings during your pregnancy. Let your dentist know you are  pregnant. Postpone non-emergency dental work until the second trimester or after delivery, if possible. Elective procedures should be postponed until after the delivery.  

## 2023-06-22 NOTE — [facility]U Note (Addendum)
 Katrina Stewart is a 28 y.o. at [redacted]w[redacted]d here in [facility]U reporting: has felt like she can't take a deep breath in(SOB). Also c/o cramping since last night. Her 28yo jumped on her stomach. Also reports she cannot feel one of the babies move today  and she feels them both "all the time" . Also concerned that she is thirsty all the time can't seem to drink enough.   LMP:  Onset of complaint: 2 days Pain score: 8 Vitals:   06/22/23 1241  BP: 126/63  Pulse: 94  Resp: 18  Temp: 98.3 F (36.8 C)  SpO2: 98%     FHT: not able in triage  Lab orders placed from triage: u/a

## 2023-06-22 NOTE — MAU Provider Note (Signed)
 Chief Complaint:  Shortness of Breath   HPI   Katrina Stewart is a 28 y.o. [id number] at [redacted]w[redacted]d who presents to maternity admissions reporting some episodes of SOB on exertion. She denies any chest pain and reports she doesn't have any difficulty breathing normally except on exertion. Denies any  upper respiratory symptoms at this time.  She has a [name] and is complaining of decreased fetal movements of both and reports she previously was able to feel both babies moving. She has a significant Medical History   [facility]   Past Medical History:  Diagnosis Date   Acute pancreatitis 12/30/2022   Alcohol abuse 12/30/2022   Alcohol use disorder    Anxiety    Anxiety and depression 12/30/2022   Bipolar affective disorder, current episode mixed (HCC) 05/05/2021   Bipolar disorder (manic depression) (HCC) 02/12/2022   BV (bacterial vaginosis) 07/19/2022   Chronic hypertension during pregnancy 11/20/2018   [X]  Aspirin  81 mg daily after 12 weeks  Current antihypertensives:  None      Baseline and surveillance labs (pulled in from [facility], refresh links as needed)           Lab Results      Component    Value    Date           PLT    315    11/20/2018           CREATININE    0.62    11/20/2018           AST    38    11/20/2018           ALT    11    11/20/2018           PROTCRRATIO              10/19/202   Chronic post-traumatic stress disorder (PTSD) 10/04/2022   Class 3 severe obesity due to excess calories without serious comorbidity with body mass index (BMI) of 45.0 to 49.9 in adult 05/05/2021   COVID-19 virus infection 12/05/2018   + again on 2/4 (asymptomatic)  postiive test in MAU on 11-2   Depression    Depression    Phreesia 12/17/2019   Depression    Phreesia 12/30/2019   Essential hypertension    Gestational diabetes mellitus (GDM)    Normal postpartum 2 hr GTT   Group B streptococcal infection in pregnancy 03/13/2019   History of depression 08/29/2018   History  of gestational diabetes 10/03/2019   Iron  deficiency anemia 01/18/2023   Prediabetes 05/05/2021   Psychophysiological insomnia 05/05/2021   Rh negative state in antepartum period 09/03/2015   [x]  needs rhogam at 28 weeks  [name] postive anti-D on initial lab. Patient received rhogam on 08/02/18 during MAU visit   Rh negative state in antepartum period 09/03/2015   [x]  needs rhogam at 28 weeks     [name] postive anti-D on initial lab. Patient received rhogam on 08/02/18 during MAU visit     Rubella non-immune status, antepartum 09/03/2015   Severe episode of recurrent major depressive disorder, without psychotic features (HCC)    Suspected sleep apnea 01/18/2023   UTI (urinary tract infection)    Vaginal Pap smear, abnormal    Vitamin D  deficiency 01/18/2023   OB History  Gravida Para Term Preterm AB Living  4 2 2  1 2   SAB IAB Ectopic Multiple Live Births  1 0  0 2    #  Outcome Date GA Lbr Len/2nd Weight Sex Type Anes PTL Lv  4 Current           3 Term 03/24/19 [redacted]w[redacted]d 00:40 / 00:13 2815 g M Vag-Spont EPI  LIV     Complications: Gestational diabetes, Elevated blood pressure reading  2 Term 02/22/16 [redacted]w[redacted]d 24:20 / 00:26 3440 g M Vag-Spont EPI  LIV  1 SAB            Past Surgical History:  Procedure Laterality Date   BREAST SURGERY  2013   Lt & Rt excision juvenile fibroadenoma   MASS EXCISION  10/29/2011   Procedure: EXCISION MASS;  Surgeon: Rogena Class, MD;  Location: [facility];  Service: General;  Laterality: Bilateral;  excision of bilateral breast masses   Family History  Problem Relation Age of Onset   Diabetes Mother    Hypertension Mother    Other Father        doesn't know what he died from   Asthma Brother    Cancer Maternal Grandmother        lung cancer   Cancer Other        bladder cancer   Social History   Tobacco Use   Smoking status: Never    Passive exposure: Never   Smokeless tobacco: Never  Vaping Use   Vaping status: Never Used  Substance Use  Topics   Alcohol use: Not Currently    Alcohol/week: 2.0 - 3.0 standard drinks of alcohol    Types: 2 - 3 Shots of liquor per week    Comment: occasional   Drug use: No   No Known Allergies No medications prior to admission.    I have reviewed patient's Past Medical Hx, Surgical Hx, Family Hx, Social Hx, medications and allergies.   ROS  Pertinent items noted in HPI and remainder of comprehensive ROS otherwise negative.   PHYSICAL EXAM   Patient Vitals for the past 24 hrs:  BP Temp Pulse Resp SpO2 Height Weight  06/22/23 1241 126/63 98.3 F (36.8 C) 94 18 98 % 5\' 9"  (1.753 m) 127.5 kg    Constitutional: Well-developed, obese female in no acute distress.  Cardiovascular: normal rate & rhythm, warm and well-perfused Respiratory: normal effort, no problems with respiration noted, Lungs BCTA GI: Abd soft, non-tender MS: Extremities nontender, no edema, normal ROM Neurologic: Alert and oriented x 4.  GU: no CVA tenderness Pelvic: deferred     Pt informed that the ultrasound is considered a limited OB ultrasound and is not intended to be a complete ultrasound exam.  Patient also informed that the ultrasound is not being completed with the intent of assessing for fetal or placental anomalies or any pelvic abnormalities.  Explained that the purpose of today's ultrasound is to assess for  maternal reassurrance and viability.  Patient acknowledges the purpose of the exam and the limitations of the study.   [name] Twin IUP visualized on Ultrasound and fetal HR's were captured and heard via Ultrasound doppler x 2 with good FM's observed x 2. Patient acknowledged both FHR's and FM's on both fetus's (Unable to obtain rates d/t frequent movement and small gestational age with increased body habitus)   Labs: No results found for this or any previous visit (from the past 24 hours).  Imaging:  No results found.  MDM & MAU COURSE  MDM:  HIGH  VSS/ O2 sat 98% room air  No evidence of  labored breathing/ Lungs BCTA EKG: NM Sinus Rhythm  Ultrasound performed at bedside for maternal reassurance d/t c/o decreased fetal movement  Review of medical records show anemia which can account for patient's SOB and or this may be related to normal physiological changes associated in  pregnancy No acute findings at this time and this patient's A/P  was reviewed with OB Attending ( Dr Racheal Buddle)  MAU Course: Orders Placed This Encounter  Procedures   Urinalysis, Routine w reflex microscopic -Urine, Clean Catch   EKG 12-Lead   Discharge patient Discharge disposition: 01-Home or Self Care; Discharge patient date: 06/22/2023    I have reviewed the patient chart and performed the physical exam . I have ordered & interpreted the lab results and performed the BSUS  Medications ordered as stated below.  A/P as described below.  Counseling and education provided and patient agreeable  with plan as described below. Verbalized understanding.    ASSESSMENT   1. Decreased fetal movement affecting management of pregnancy, antepartum, single or unspecified fetus   2. SOB (shortness of breath)   3. Dichorionic diamniotic twin pregnancy in second trimester   4. [redacted] weeks gestation of pregnancy      PLAN   Future Appointments  Date Time Provider Department Center  06/24/2023  9:30 AM Shery Done, MD [facility] None  06/28/2023  2:00 PM Earnie Gola, Allana Ishikawa [facility] None  07/05/2023  3:55 PM Tresia Fruit, MD [facility]  07/12/2023 10:00 AM [facility] PROVIDER 1 [facility]  07/12/2023 10:30 AM [facility] US1 [facility]     Discharge home in stable condition with return precautions.   See AVS for full description of information given to the patient including both verbal and written. Patient verbalized understanding and agrees with the plan as described above.     Follow-up Information     [facility] Follow up.   Specialty: Obstetrics and  Gynecology Why: As scheduled for ongoing prenatal care, If symptoms worsen or fail to resolve Contact information: [address] [place]  [id number] [phone]                Allergies as of 06/22/2023   No Known Allergies      Medication List     TAKE these medications    acetaminophen  500 MG tablet Commonly known as: TYLENOL  Take 1,000 mg by mouth every 6 (six) hours as needed.   aspirin  EC 81 MG tablet Take 2 tablets (162 mg total) by mouth daily. Swallow whole.   Blood Pressure Kit Devi 1 each by Does not apply route as needed.   Ferric Maltol  30 MG Caps Take 1 capsule (30 mg total) by mouth 2 (two) times daily. Please take one hour before breakfast and dinner   FLUoxetine  40 MG capsule Commonly known as: PROZAC  Take 1 capsule (40 mg total) by mouth daily.   Gojji Weight Scale Misc 1 Device by Does not apply route as needed.   metoCLOPramide  10 MG tablet Commonly known as: Reglan  Take 1 tablet (10 mg total) by mouth 4 (four) times daily.   multivitamin-prenatal 27-0.8 MG Tabs tablet Take 1 tablet by mouth daily at 12 noon.   ondansetron  4 MG disintegrating tablet Commonly known as: ZOFRAN -ODT Take 1 tablet (4 mg total) by mouth every 8 (eight) hours as needed for nausea.   ondansetron  4 MG tablet Commonly known as: Zofran  Take 1 tablet (4 mg total) by mouth every 8 (eight) hours as needed for nausea or vomiting.  promethazine  25 MG tablet Commonly known as: PHENERGAN  Take 1 tablet (25 mg total) by mouth every 6 (six) hours as needed for nausea or vomiting.   QUEtiapine  200 MG tablet Commonly known as: SEROQUEL  Take 1 tablet (200 mg total) by mouth at bedtime.   scopolamine  1 MG/3DAYS Commonly known as: TRANSDERM-SCOP Place 1 patch (1.5 mg total) onto the skin every 3 (three) days.        Debbe Fail, MSN, [facility], [facility] [employer]

## 2023-06-23 ENCOUNTER — Other Ambulatory Visit: Payer: Self-pay | Admitting: Lactation Services

## 2023-06-23 DIAGNOSIS — O0991 Supervision of high risk pregnancy, unspecified, first trimester: Secondary | ICD-10-CM

## 2023-06-23 DIAGNOSIS — O219 Vomiting of pregnancy, unspecified: Secondary | ICD-10-CM

## 2023-06-23 MED ORDER — PROMETHAZINE HCL 25 MG PO TABS: 25.0000 mg | ORAL_TABLET | Freq: Four times a day (QID) | ORAL | 2 refills | Status: DC | PRN

## 2023-06-23 MED ORDER — CVS PRENATAL GUMMY 0.18-25 MG PO CHEW: 3.0000 | CHEWABLE_TABLET | Freq: Every day | ORAL | 11 refills | Status: DC

## 2023-06-24 ENCOUNTER — Telehealth (HOSPITAL_COMMUNITY): Admitting: Student

## 2023-06-24 ENCOUNTER — Encounter (HOSPITAL_COMMUNITY): Payer: Self-pay | Admitting: Student

## 2023-06-24 DIAGNOSIS — F319 Bipolar disorder, unspecified: Secondary | ICD-10-CM

## 2023-06-24 DIAGNOSIS — F4312 Post-traumatic stress disorder, chronic: Secondary | ICD-10-CM

## 2023-06-24 DIAGNOSIS — F1021 Alcohol dependence, in remission: Secondary | ICD-10-CM

## 2023-06-24 DIAGNOSIS — Z3A17 17 weeks gestation of pregnancy: Secondary | ICD-10-CM

## 2023-06-24 DIAGNOSIS — F411 Generalized anxiety disorder: Secondary | ICD-10-CM | POA: Diagnosis not present

## 2023-06-24 DIAGNOSIS — F3160 Bipolar disorder, current episode mixed, unspecified: Secondary | ICD-10-CM

## 2023-06-24 DIAGNOSIS — R29818 Other symptoms and signs involving the nervous system: Secondary | ICD-10-CM

## 2023-06-24 MED ORDER — QUETIAPINE FUMARATE 200 MG PO TABS
200.0000 mg | ORAL_TABLET | Freq: Every day | ORAL | 0 refills | Status: DC
Start: 2023-06-24 — End: 2023-07-21

## 2023-06-24 MED ORDER — FLUOXETINE HCL 40 MG PO CAPS: 40.0000 mg | ORAL_CAPSULE | Freq: Every day | ORAL | 1 refills | Status: DC
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
## 2023-06-28 ENCOUNTER — Ambulatory Visit (INDEPENDENT_AMBULATORY_CARE_PROVIDER_SITE_OTHER)

## 2023-06-28 DIAGNOSIS — F1021 Alcohol dependence, in remission: Secondary | ICD-10-CM

## 2023-06-28 DIAGNOSIS — F411 Generalized anxiety disorder: Secondary | ICD-10-CM

## 2023-06-28 DIAGNOSIS — F4312 Post-traumatic stress disorder, chronic: Secondary | ICD-10-CM

## 2023-06-28 DIAGNOSIS — F319 Bipolar disorder, unspecified: Secondary | ICD-10-CM

## 2023-06-28 NOTE — Progress Notes (Signed)
 THERAPIST PROGRESS NOTE  Session Time: 2:01 pm to 3:00 pm  Virtual Visit via Video Note   I connected with Katrina Stewart  at 2:01 pm  EST by a video enabled telemedicine application and verified that I am speaking with the correct person using two identifiers.   Location: Patient: home Provider: [address] [place]   I discussed the limitations of evaluation and management by telemedicine and the availability of in person appointments. The patient expressed understanding and agreed to proceed.   Type of Therapy: Individual   Therapist Response/Interventions: CBT/Inquired if Katrina had practiced distraction and coping skills to deal with triggers to drinking, provide motivational interviewing, discuss improvements noted to mood stability without alcohol and discussed benefits of continued abstinence  Treatment Goals addressed:Template: Depression (OP) Outpatient Treatment Plan 01/06/23  Problem: OP Depression Dates: Start: 01/06/23 Disciplines: Interdisciplinary, PROVIDER Lab Results Component Value Date PLT 315 11/20/2018 CREATININE 0.62 11/20/2018 AST 38 11/20/2018 ALT 11 11/20/2018 PROTCRRATIO 11/20/2018 Goal: Katrina will report a reduction in her anxiety and mood sx as evidenced by her PHQ-9 and GAD- Page 1 of 2 Template: Depression (OP) (continued) Current Medications FLUoxetine  (PROZAC ) 20 MG capsule folic acid  (FOLVITE ) 1 MG tablet Iron , Ferrous Sulfate , 325 (65 Fe) MG TABS Multiple Vitamin (MULTIVITAMIN WITH MINERALS) TABS tablet ondansetron  (ZOFRAN ) 4 MG tablet phentermine  (ADIPEX-P ) 37.5 MG tablet polyethylene glycol (MIRALAX ) 17 g packet QUEtiapine  (SEROQUEL ) 300 MG tablet senna-docusate (SENOKOT-S) 8.6-50 MG tablet thiamine  (VITAMIN B-1) 100 MG tablet her GAD-7 as being less than a 4. Dates: Start: 01/06/23 Expected End: 07/07/23 Disciplines: Interdisciplinary, PROVIDER Goal: Katrina will abstain from alcohol daily per self-report per self-report and  breathalyzer if indicated Dates: Start: 01/06/23 Expected End: 07/07/23 Disciplines: Interdisciplinary, PROVIDER Intervention: Therapist will assist Katrina in identifying and changing thourghts and behaviors that contribute to feelings or anxiety and unstable mood. Dates: Start: 01/06/23 Intervention: Therapist will educate Katrina about SUD's patterns and consequences of use, relapse risks, the treatment process and types of mutual-support group and provide early recovery coping and relapse prevention skills. Dates: Start: 01/06/23 Description: Katrina Stewart provided verbal permission for this therapist to sign her Care Plan electronically  Summary: Katrina Stewart presents today for virtual therapy.  She rates her depression as a 7 on a scale of 1-10 with 10 being the highest. Therapist checks in with Katrina to ask if she followed up with any of the tasks therapist ask her to do, including utilizing distraction skills and coping skills to deal with the emotional triggers she has, including frustration, as well as grounding techniques to decrease her anxiety levels. Katrina says she has not completed any of these suggestions. Even when prompted to account for possibly why she did not engage in these activities, Katrina Stewart says she does not know why she didn't try these activities.   Katrina Stewart says she is not drinking but is thinking after the twins are born, she probably will want to start drinking again.  Therapist explored her improved mood since she stopped drinking and asked if this would be a motivator to continue sobriety. Katrina says she just likes to drink.  She says she drinks to the point of passing out.   Katrina says she does think of negative consequences of drinking and getting drunk. She has given money away and has been taking advantage of and been  in the hospital with pancreatis. Katrina says the positive things about drinking and getting drunk is that it makes her happier and she is friendlier.  Katrina says  her mother has told  her she drinks too much but she say "that was her opinion and it does not bother me".  Therapist inquires if Katrina Stewart thinks she has been happier while sober and today she says she has not and that she is happier when she is drinking.  Therapist points out that she has given providers differing response to this issue. Katrina says she may not remember.  Progress Towards Goals: Minimal progress  Suicidal/Homicidal: denies  Plan: Return again virtually on  August 02, 2023 at 2pm.  Diagnosis: Post Traumatic Stress Disorder Generalized Anxiety Disorder Alcohol Use Disorder, Severe, Dependence Bipolar Affective Disorder  Collaboration of Care: N/A  Patient/Guardian was advised Release of Information must be obtained prior to any record release in order to collaborate their care with an outside provider. Patient/Guardian was advised if they have not already done so to contact the registration department to sign all necessary forms in order for us  to release information regarding their care.   Consent: Patient/Guardian gives verbal consent for treatment and assignment of benefits for services provided during this visit. Patient/Guardian expressed understanding and agreed to proceed.   Earnie Gola, LMFT, LCAS 06-28-23

## 2023-06-29 ENCOUNTER — Other Ambulatory Visit: Payer: Self-pay | Admitting: Lactation Services

## 2023-06-29 DIAGNOSIS — O0992 Supervision of high risk pregnancy, unspecified, second trimester: Secondary | ICD-10-CM

## 2023-06-29 MED ORDER — ONDANSETRON HCL 4 MG PO TABS: 4.0000 mg | ORAL_TABLET | Freq: Three times a day (TID) | ORAL | 2 refills | Status: DC | PRN

## 2023-07-01 ENCOUNTER — Inpatient Hospital Stay (HOSPITAL_COMMUNITY)
Admission: AD | Admit: 2023-07-01 | Discharge: 2023-07-02 | Disposition: A | Discharge status: Home / Self Care | Payer: Self-pay | Attending: Obstetrics and Gynecology | Admitting: Obstetrics and Gynecology

## 2023-07-01 DIAGNOSIS — O368120 Decreased fetal movements, second trimester, not applicable or unspecified: Secondary | ICD-10-CM | POA: Insufficient documentation

## 2023-07-01 DIAGNOSIS — O30042 Twin pregnancy, dichorionic/diamniotic, second trimester: Secondary | ICD-10-CM | POA: Diagnosis not present

## 2023-07-01 DIAGNOSIS — Z6791 Unspecified blood type, Rh negative: Secondary | ICD-10-CM

## 2023-07-01 DIAGNOSIS — O0992 Supervision of high risk pregnancy, unspecified, second trimester: Secondary | ICD-10-CM

## 2023-07-01 DIAGNOSIS — Z3A18 18 weeks gestation of pregnancy: Secondary | ICD-10-CM | POA: Diagnosis not present

## 2023-07-01 DIAGNOSIS — O368130 Decreased fetal movements, third trimester, not applicable or unspecified: Secondary | ICD-10-CM | POA: Diagnosis present

## 2023-07-01 DIAGNOSIS — O10919 Unspecified pre-existing hypertension complicating pregnancy, unspecified trimester: Secondary | ICD-10-CM

## 2023-07-01 DIAGNOSIS — O24419 Gestational diabetes mellitus in pregnancy, unspecified control: Secondary | ICD-10-CM

## 2023-07-01 DIAGNOSIS — O99012 Anemia complicating pregnancy, second trimester: Secondary | ICD-10-CM

## 2023-07-01 DIAGNOSIS — R109 Unspecified abdominal pain: Secondary | ICD-10-CM | POA: Diagnosis present

## 2023-07-01 DIAGNOSIS — D563 Thalassemia minor: Secondary | ICD-10-CM

## 2023-07-01 LAB — URINALYSIS, ROUTINE W REFLEX MICROSCOPIC
Bilirubin Urine: NEGATIVE
Glucose, UA: NEGATIVE mg/dL
Ketones, ur: NEGATIVE mg/dL
Leukocytes,Ua: NEGATIVE
Nitrite: NEGATIVE
Protein, ur: NEGATIVE mg/dL
Specific Gravity, Urine: 1.009 (ref 1.005–1.030)
pH: 5 (ref 5.0–8.0)

## 2023-07-01 NOTE — MAU Note (Signed)
 Pt says she feels cramping - started Wed 9/10 - took XS Tyl - took 2 tabs at 6pm-  now- 6/10 [facility]- clinic

## 2023-07-02 DIAGNOSIS — Z3A18 18 weeks gestation of pregnancy: Secondary | ICD-10-CM

## 2023-07-02 DIAGNOSIS — O30042 Twin pregnancy, dichorionic/diamniotic, second trimester: Secondary | ICD-10-CM

## 2023-07-02 DIAGNOSIS — O368120 Decreased fetal movements, second trimester, not applicable or unspecified: Secondary | ICD-10-CM

## 2023-07-02 NOTE — MAU Provider Note (Signed)
 Chief Complaint:  Abdominal Pain   HPI    Katrina Stewart is a 28 y.o. [id number] at [redacted]w[redacted]d who presents to maternity admissions reporting decreased fetal movements of known Katrina Stewart TIUP. She reports she has felt fetal movements prior with both fetus's .   [facility] ( Prenatal Records reviewed)  Past Medical History:  Diagnosis Date   Acute pancreatitis 12/30/2022   Alcohol abuse 12/30/2022   Alcohol use disorder    Anxiety    Anxiety and depression 12/30/2022   Bipolar affective disorder, current episode mixed (HCC) 05/05/2021   Bipolar disorder (manic depression) (HCC) 02/12/2022   BV (bacterial vaginosis) 07/19/2022   Chronic hypertension during pregnancy 11/20/2018   [X]  Aspirin  81 mg daily after 12 weeks  Current antihypertensives:  None      Baseline and surveillance labs (pulled in from [facility], refresh links as needed)           Lab Results      Component    Value    Date           PLT    315    11/20/2018           CREATININE    0.62    11/20/2018           AST    38    11/20/2018           ALT    11    11/20/2018           PROTCRRATIO              10/19/202   Chronic post-traumatic stress disorder (PTSD) 10/04/2022   Class 3 severe obesity due to excess calories without serious comorbidity with body mass index (BMI) of 45.0 to 49.9 in adult 05/05/2021   COVID-19 virus infection 12/05/2018   + again on 2/4 (asymptomatic)  postiive test in MAU on 11-2   Depression    Depression    Phreesia 12/17/2019   Depression    Phreesia 12/30/2019   Essential hypertension    Gestational diabetes mellitus (GDM)    Normal postpartum 2 hr GTT   Group B streptococcal infection in pregnancy 03/13/2019   History of depression 08/29/2018   History of gestational diabetes 10/03/2019   Iron  deficiency anemia 01/18/2023   Prediabetes 05/05/2021   Psychophysiological insomnia 05/05/2021   Rh negative state in antepartum period 09/03/2015   [x]  needs rhogam at 28 weeks  [name]  postive anti-D on initial lab. Patient received rhogam on 08/02/18 during MAU visit   Rh negative state in antepartum period 09/03/2015   [x]  needs rhogam at 28 weeks     [name] postive anti-D on initial lab. Patient received rhogam on 08/02/18 during MAU visit     Rubella non-immune status, antepartum 09/03/2015   Severe episode of recurrent major depressive disorder, without psychotic features (HCC)    Suspected sleep apnea 01/18/2023   UTI (urinary tract infection)    Vaginal Pap smear, abnormal    Vitamin D  deficiency 01/18/2023   OB History  Gravida Para Term Preterm AB Living  4 2 2  1 2   SAB IAB Ectopic Multiple Live Births  1 0  0 2    # Outcome Date GA Lbr Len/2nd Weight Sex Type Anes PTL Lv  4 Current           3 Term 03/24/19 [redacted]w[redacted]d 00:40 / 00:13 2815 g M Vag-Spont EPI  LIV  Complications: Gestational diabetes, Elevated blood pressure reading  2 Term 02/22/16 [redacted]w[redacted]d 24:20 / 00:26 3440 g M Vag-Spont EPI  LIV  1 SAB            Past Surgical History:  Procedure Laterality Date   BREAST SURGERY  2013   Lt & Rt excision juvenile fibroadenoma   MASS EXCISION  10/29/2011   Procedure: EXCISION MASS;  Surgeon: Rogena Class, MD;  Location: [facility];  Service: General;  Laterality: Bilateral;  excision of bilateral breast masses   Family History  Problem Relation Age of Onset   Diabetes Mother    Hypertension Mother    Other Father        doesn't know what he died from   Asthma Brother    Cancer Maternal Grandmother        lung cancer   Cancer Other        bladder cancer   Social History   Tobacco Use   Smoking status: Never    Passive exposure: Never   Smokeless tobacco: Never  Vaping Use   Vaping status: Never Used  Substance Use Topics   Alcohol use: Not Currently    Alcohol/week: 2.0 - 3.0 standard drinks of alcohol    Types: 2 - 3 Shots of liquor per week    Comment: occasional   Drug use: No   No Known Allergies Medications Prior to Admission   Medication Sig Dispense Refill Last Dose/Taking   acetaminophen  (TYLENOL ) 500 MG tablet Take 1,000 mg by mouth every 6 (six) hours as needed.      aspirin  EC 81 MG tablet Take 2 tablets (162 mg total) by mouth daily. Swallow whole. 300 tablet 2    Blood Pressure Monitoring (BLOOD PRESSURE KIT) DEVI 1 each by Does not apply route as needed. 1 each 0    Ferric Maltol  30 MG CAPS Take 1 capsule (30 mg total) by mouth 2 (two) times daily. Please take one hour before breakfast and dinner 60 capsule 10    FLUoxetine  (PROZAC ) 40 MG capsule Take 1 capsule (40 mg total) by mouth daily. 30 capsule 1    metoCLOPramide  (REGLAN ) 10 MG tablet Take 1 tablet (10 mg total) by mouth 4 (four) times daily. 120 tablet 3    Misc. Devices (GOJJI WEIGHT SCALE) MISC 1 Device by Does not apply route as needed. 1 each 0    ondansetron  (ZOFRAN ) 4 MG tablet Take 1 tablet (4 mg total) by mouth every 8 (eight) hours as needed for nausea or vomiting. 42 tablet 2    ondansetron  (ZOFRAN -ODT) 4 MG disintegrating tablet Take 1 tablet (4 mg total) by mouth every 8 (eight) hours as needed for nausea. (Patient not taking: Reported on 06/24/2023) 42 tablet 2    Prenatal MV & Min w/FA-DHA ([employer] PRENATAL GUMMY) 0.18-25 MG CHEW Chew 3 each by mouth daily. 90 tablet 11    Prenatal Vit-Fe Fumarate-FA (MULTIVITAMIN-PRENATAL) 27-0.8 MG TABS tablet Take 1 tablet by mouth daily at 12 noon.      promethazine  (PHENERGAN ) 25 MG tablet Take 1 tablet (25 mg total) by mouth every 6 (six) hours as needed for nausea or vomiting. 30 tablet 2    QUEtiapine  (SEROQUEL ) 200 MG tablet Take 1 tablet (200 mg total) by mouth at bedtime. 30 tablet 0    scopolamine  (TRANSDERM-SCOP) 1 MG/3DAYS Place 1 patch (1.5 mg total) onto the skin every 3 (three) days. 10 patch 12     I have reviewed patient's  Past Medical Hx, Surgical Hx, Family Hx, Social Hx, medications and allergies.   ROS  Pertinent items noted in HPI and remainder of comprehensive ROS otherwise negative.    PHYSICAL EXAM   Patient Vitals for the past 24 hrs:  BP Temp Pulse Resp Weight  07/01/23 2202 135/76 98.2 F (36.8 C) 92 12 130.4 kg    Constitutional: Well-developed, obese female in no acute distress.  Cardiovascular: normal rate & rhythm, warm and well-perfused Respiratory: normal effort, no problems with respiration noted GI: Abd soft, obese ( Limited exam d/t increased maternal habitus) MS: Extremities nontender, no edema, normal ROM Neurologic: Alert and oriented x 4.  GU: no CVA tenderness Pelvic: Deferred       Labs: Results for orders placed or performed during the hospital encounter of 07/01/23 (from the past 24 hours)  Urinalysis, Routine w reflex microscopic -Urine, Clean Catch     Status: Abnormal   Collection Time: 07/01/23 10:27 PM  Result Value Ref Range   Color, Urine STRAW (A) YELLOW   APPearance CLEAR CLEAR   Specific Gravity, Urine 1.009 1.005 - 1.030   pH 5.0 5.0 - 8.0   Glucose, UA NEGATIVE NEGATIVE mg/dL   Hgb urine dipstick SMALL (A) NEGATIVE   Bilirubin Urine NEGATIVE NEGATIVE   Ketones, ur NEGATIVE NEGATIVE mg/dL   Protein, ur NEGATIVE NEGATIVE mg/dL   Nitrite NEGATIVE NEGATIVE   Leukocytes,Ua NEGATIVE NEGATIVE   RBC / HPF 0-5 0 - 5 RBC/hpf   WBC, UA 0-5 0 - 5 WBC/hpf   Bacteria, UA RARE (A) NONE SEEN   Squamous Epithelial / HPF 0-5 0 - 5 /HPF   Mucus PRESENT       MDM & MAU COURSE  MDM:  Moderate  BSUS to be performed for Fetal movements and maternal reassurance    Pt informed that the ultrasound is considered a limited OB ultrasound and is not intended to be a complete ultrasound exam.  Patient also informed that the ultrasound is not being completed with the intent of assessing for fetal or placental anomalies or any pelvic abnormalities.  Explained that the purpose of today's ultrasound is to assess for  fetal movements, maternal reassurance.  Patient acknowledges the purpose of the exam and the limitations of the study. Good FM's x  2 visualized  with FHR x 2 visualized  , unable to capture [facility] due to increased maternal habitus, limitations of BSUS and small gestation of Katrina Stewart  Twins. Patient acknowledges good FM's x 2 and FHR's x 2 and reassurance provided   MAU Course: Orders Placed This Encounter  Procedures   Urinalysis, Routine w reflex microscopic -Urine, Clean Catch   Discharge patient Discharge disposition: 01-Home or Self Care; Discharge patient date: 07/02/2023     A/P as described below.  Counseling and education provided and patient agreeable  with plan as described below. Verbalized understanding.    ASSESSMENT   1. Decreased fetal movement affecting management of pregnancy in second trimester, single or unspecified fetus   2. Dichorionic diamniotic twin pregnancy in second trimester   3. [redacted] weeks gestation of pregnancy     PLAN  Discharge home in stable condition with return precautions.   F/U with OB/ MFM   See AVS for full description of information given to the patient including both verbal and written. Patient verbalized understanding and agrees with the plan as described above.     Follow-up Information     Center for [facility]  [facility] Follow up.   Specialty: Obstetrics and Gynecology Why: If symptoms worsen or fail to resolve, As scheduled for ongoing prenatal care Contact information: [address] [place]  [id number] [phone]                Allergies as of 07/02/2023   No Known Allergies      Medication List     TAKE these medications    acetaminophen  500 MG tablet Commonly known as: TYLENOL  Take 1,000 mg by mouth every 6 (six) hours as needed.   aspirin  EC 81 MG tablet Take 2 tablets (162 mg total) by mouth daily. Swallow whole.   Blood Pressure Kit Devi 1 each by Does not apply route as needed.   CVS Prenatal Gummy 0.18-25 MG Chew Chew 3 each by mouth daily.   Ferric Maltol  30 MG Caps Take 1  capsule (30 mg total) by mouth 2 (two) times daily. Please take one hour before breakfast and dinner   FLUoxetine  40 MG capsule Commonly known as: PROZAC  Take 1 capsule (40 mg total) by mouth daily.   Gojji Weight Scale Misc 1 Device by Does not apply route as needed.   metoCLOPramide  10 MG tablet Commonly known as: Reglan  Take 1 tablet (10 mg total) by mouth 4 (four) times daily.   multivitamin-prenatal 27-0.8 MG Tabs tablet Take 1 tablet by mouth daily at 12 noon.   ondansetron  4 MG disintegrating tablet Commonly known as: ZOFRAN -ODT Take 1 tablet (4 mg total) by mouth every 8 (eight) hours as needed for nausea.   ondansetron  4 MG tablet Commonly known as: Zofran  Take 1 tablet (4 mg total) by mouth every 8 (eight) hours as needed for nausea or vomiting.   promethazine  25 MG tablet Commonly known as: PHENERGAN  Take 1 tablet (25 mg total) by mouth every 6 (six) hours as needed for nausea or vomiting.   QUEtiapine  200 MG tablet Commonly known as: SEROQUEL  Take 1 tablet (200 mg total) by mouth at bedtime.   scopolamine  1 MG/3DAYS Commonly known as: TRANSDERM-SCOP Place 1 patch (1.5 mg total) onto the skin every 3 (three) days.        Debbe Fail, MSN, [facility], Center for [employer]

## 2023-07-05 ENCOUNTER — Ambulatory Visit (INDEPENDENT_AMBULATORY_CARE_PROVIDER_SITE_OTHER): Admitting: Obstetrics & Gynecology

## 2023-07-05 ENCOUNTER — Other Ambulatory Visit: Payer: Self-pay

## 2023-07-05 VITALS — BP 115/73 | HR 109 | Wt 283.5 lb

## 2023-07-05 DIAGNOSIS — Z3A19 19 weeks gestation of pregnancy: Secondary | ICD-10-CM | POA: Diagnosis not present

## 2023-07-05 DIAGNOSIS — O30042 Twin pregnancy, dichorionic/diamniotic, second trimester: Secondary | ICD-10-CM

## 2023-07-05 DIAGNOSIS — O0992 Supervision of high risk pregnancy, unspecified, second trimester: Secondary | ICD-10-CM

## 2023-07-05 NOTE — Patient Instructions (Signed)

## 2023-07-05 NOTE — Progress Notes (Signed)
   PRENATAL VISIT NOTE  Subjective:  Katrina Stewart is a 28 y.o. [id number] at [redacted]w[redacted]d being seen today for ongoing prenatal care.  She is currently monitored for the following issues for this high-risk pregnancy and has Hx of Chronic hypertension during pregnancy; Hx of Gestational diabetes mellitus (GDM), antepartum; Bipolar affective disorder, current episode mixed (HCC); Chronic post-traumatic stress disorder (PTSD); Suspected sleep apnea; Alpha thalassemia silent carrier; Supervision of high-risk pregnancy; Rh negative state in antepartum period; Generalized anxiety disorder; Dichorionic diamniotic twin pregnancy; and Anemia of pregnancy in second trimester on their problem list.  Patient reports no complaints.  Contractions: Not present. Vag. Bleeding: None.  Movement: Present. Denies leaking of fluid.   The following portions of the patient's history were reviewed and updated as appropriate: allergies, current medications, past family history, past medical history, past social history, past surgical history and problem list.   Objective:    Vitals:   07/05/23 1606  BP: 115/73  Pulse: (!) 109  Weight: 283 lb 8 oz (128.6 kg)    Fetal Status:      Movement: Present    General: Alert, oriented and cooperative. Patient is in no acute distress.  Skin: Skin is warm and dry. No rash noted.   Cardiovascular: Normal heart rate noted  Respiratory: Normal respiratory effort, no problems with respiration noted  Abdomen: Soft, gravid, appropriate for gestational age.  Pain/Pressure: Present     Pelvic: Cervical exam deferred        Extremities: Normal range of motion.  Edema: None  Mental Status: Normal mood and affect. Normal behavior. Normal judgment and thought content.   Assessment and Plan:  Pregnancy: [id number] at [redacted]w[redacted]d 1. Supervision of high risk pregnancy in second trimester (Primary) Unable to result Panorama x  - AFP TETRA  2. Dichorionic diamniotic twin pregnancy in second  trimester  - AFP TETRA  Preterm labor symptoms and general obstetric precautions including but not limited to vaginal bleeding, contractions, leaking of fluid and fetal movement were reviewed in detail with the patient. Please refer to After Visit Summary for other counseling recommendations.   No follow-ups on file.  Future Appointments  Date Time Provider Department Center  07/12/2023 10:00 AM [facility] PROVIDER 1 [facility]  07/12/2023 10:30 AM [facility] US1 [facility]  07/21/2023  3:30 PM Shery Done, MD [facility] None  08/02/2023  2:00 PM Earnie Gola, Allana Ishikawa [facility] None  08/03/2023  4:15 PM Abigail Abler, MD [facility]    Onnie Bilis, MD

## 2023-07-07 ENCOUNTER — Ambulatory Visit

## 2023-07-07 ENCOUNTER — Ambulatory Visit: Payer: Self-pay | Admitting: Obstetrics & Gynecology

## 2023-07-07 LAB — AFP TETRA
DIA Mom Value: 1.64
DIA Value (EIA): 196.68 pg/mL
DSR (By Age)    1 IN: 665
DSR (Second Trimester) 1 IN: 10000
Gestational Age: 19 wk
MSAFP Mom: 1.08
MSAFP: 40.8 ng/mL
MSHCG Mom: 0.96
MSHCG: 17848 m[IU]/mL
Maternal Age At EDD: 30.4 a
Osb Risk: 10000
T18 (By Age): 1:2590 {titer}
Test Results:: NEGATIVE
Weight: 283 [lb_av]
uE3 Mom: 1.57
uE3 Value: 2.7 ng/mL

## 2023-07-12 ENCOUNTER — Ambulatory Visit: Admitting: Maternal & Fetal Medicine

## 2023-07-12 ENCOUNTER — Other Ambulatory Visit: Payer: Self-pay | Admitting: *Deleted

## 2023-07-12 ENCOUNTER — Ambulatory Visit: Attending: Certified Nurse Midwife

## 2023-07-12 VITALS — BP 125/77 | HR 93

## 2023-07-12 DIAGNOSIS — O99012 Anemia complicating pregnancy, second trimester: Secondary | ICD-10-CM

## 2023-07-12 DIAGNOSIS — O43192 Other malformation of placenta, second trimester: Secondary | ICD-10-CM

## 2023-07-12 DIAGNOSIS — Z6791 Unspecified blood type, Rh negative: Secondary | ICD-10-CM

## 2023-07-12 DIAGNOSIS — Z3A20 20 weeks gestation of pregnancy: Secondary | ICD-10-CM | POA: Diagnosis present

## 2023-07-12 DIAGNOSIS — D563 Thalassemia minor: Secondary | ICD-10-CM

## 2023-07-12 DIAGNOSIS — E669 Obesity, unspecified: Secondary | ICD-10-CM | POA: Diagnosis not present

## 2023-07-12 DIAGNOSIS — O99342 Other mental disorders complicating pregnancy, second trimester: Secondary | ICD-10-CM

## 2023-07-12 DIAGNOSIS — O24419 Gestational diabetes mellitus in pregnancy, unspecified control: Secondary | ICD-10-CM | POA: Insufficient documentation

## 2023-07-12 DIAGNOSIS — O10919 Unspecified pre-existing hypertension complicating pregnancy, unspecified trimester: Secondary | ICD-10-CM | POA: Diagnosis present

## 2023-07-12 DIAGNOSIS — O30042 Twin pregnancy, dichorionic/diamniotic, second trimester: Secondary | ICD-10-CM

## 2023-07-12 DIAGNOSIS — O43193 Other malformation of placenta, third trimester: Secondary | ICD-10-CM | POA: Insufficient documentation

## 2023-07-12 DIAGNOSIS — O0991 Supervision of high risk pregnancy, unspecified, first trimester: Secondary | ICD-10-CM

## 2023-07-12 DIAGNOSIS — O99212 Obesity complicating pregnancy, second trimester: Secondary | ICD-10-CM

## 2023-07-12 DIAGNOSIS — O283 Abnormal ultrasonic finding on antenatal screening of mother: Secondary | ICD-10-CM | POA: Diagnosis present

## 2023-07-12 DIAGNOSIS — O30049 Twin pregnancy, dichorionic/diamniotic, unspecified trimester: Secondary | ICD-10-CM

## 2023-07-12 DIAGNOSIS — O0992 Supervision of high risk pregnancy, unspecified, second trimester: Secondary | ICD-10-CM | POA: Insufficient documentation

## 2023-07-12 DIAGNOSIS — F419 Anxiety disorder, unspecified: Secondary | ICD-10-CM

## 2023-07-12 DIAGNOSIS — O10012 Pre-existing essential hypertension complicating pregnancy, second trimester: Secondary | ICD-10-CM | POA: Diagnosis not present

## 2023-07-12 NOTE — Progress Notes (Unsigned)
 Patient information  Patient Name: Katrina Stewart  Patient MRN:   [id number]  Referring practice: {MFM Referring Provider:[id number]}  Problem List   Patient Active Problem List   Diagnosis Date Noted   Anemia of pregnancy in second trimester 06/07/2023   Dichorionic diamniotic twin pregnancy 06/06/2023   Generalized anxiety disorder 05/17/2023   Alpha thalassemia silent carrier 05/03/2023   Supervision of high-risk pregnancy 05/03/2023   Suspected sleep apnea 01/18/2023   Chronic post-traumatic stress disorder (PTSD) 10/04/2022   Bipolar affective disorder, current episode mixed (HCC) 05/05/2021   Hx of Gestational diabetes mellitus (GDM), antepartum 01/08/2019   Hx of Chronic hypertension during pregnancy 11/20/2018   Rh negative state in antepartum period 09/03/2015    Maternal Fetal Medicine Consult Katrina Stewart is a 28 y.o. [id number] at [redacted]w[redacted]d here for ultrasound and consultation. She had NIPT that was insufficient fetal fraction x 2 of a di-di twins: female x2. Carrier screening was positive for alph-thal silent carrier. Maternal serum AFPwas negative. Today we focused on the following:   Elevated BMI: I discussed the potential complications associated with obesity in pregnancy.  These complications include but are not limited to increased risk of excessive maternal weight gain, fetal growth abnormalities, fetal congenital disorders, inability to visualize fetal anatomic structures on ultrasound, gestational diabetes, hypertensive disorders of pregnancy, operative birth including cesarean delivery or assisted vaginal delivery, delayed wound healing and many long-term health complications.  I discussed the need for continued growth ultrasounds and possibly antenatal testing depending upon how the pregnancy course progresses.  Maternal weight gain should be limited to 10 to 20 pounds during the pregnancy.  While normal weight loss may occur during the first and early second trimester,  efforts to actively lose weight with the use of medication is not recommended during pregnancy.  A whole food diet and regular exercise of at least 15 to 30 minutes of moderately strenuous activity is recommended in the absence of any contraindications. Weight loss with the use of medications is not recommended during pregnancy.  If other existing comorbidities are present then 81 mg of aspirin  should be considered for preeclampsia risk reduction.   Hx of GDM and preeclampsia: I discussed the potential for recurrence and the need to continue to take 81 mg of aspirin  to the reduce the risk of developing preeclampsia. I reassured the patient that we expect a favorable pregnancy outcome but due to her pregnancy complications she will need a higher level of monitoring for her pregnancy compared to a pregnancy without complications. The patient had time to ask questions that were answered to her satisfaction. She verbalized understanding of our discussion and agreed to proceed with the plan outlined in the recommendations.   Sonographic findings Di-di intrauterine pregnancy at . Fetal cardiac activity: A: Observed, B: Observed. Presentation: A: Breech lower, B: Breech upper.  Fetal biometry shows: (estimated fetal weight):  percentile.  Amniotic fluid volume: A: Within normal limits, B: Within normal limits. Placenta: A: Anterior, B: Posterior. Cervix: Normal appearance by transabdominal scan with a cervical length of 4. Adnexa: No masses visualized  There are limitations of prenatal ultrasound such as the inability to detect certain abnormalities due to poor visualization. Various factors such as fetal position, gestational age and maternal body habitus may increase the difficulty in visualizing the fetal anatomy.    Recommendations - ***Aneuploidy screening if not previously completed. - Anatomy ultrasound was done today with the above findings (see report). - Aspirin  81-162 mg from 12 weeks and  continued throughout the pregnancy for preeclampsia prophylaxis. - Baseline labs: CMP, CBC, urine protein creatinine ratio. - Blood pressure goal of < 140 systolic and < 90 diastolic. Antihypertensive medication should be added/adjusted until BP goal is achieved.  - ***Early Glucola screening (or A1c of patient declines). - ***Baseline EKG. - ***Maternal Echocardiogram if EKG is abnormal or if hypertension is >5 years or patient develops concerning symptoms of heart failure. - ***Referral to diabetic education. - ***Fetal echocardiogram due to increased risk of congenital heart defects in this patient population. - Serial growth ultrasounds every 4 weeks starting at 24 to 28 weeks until delivery. - Antenatal testing (usually weekly BPP or NST) weekly at 32 weeks until delivery. - Delivery likely around *** weeks gestation or sooner if indicated.  Review of Systems: A review of systems was performed and was negative except per HPI   Past Obstetrical History:  OB History  Gravida Para Term Preterm AB Living  4 2 2  1 2   SAB IAB Ectopic Multiple Live Births  1 0  0 2    # Outcome Date GA Lbr Len/2nd Weight Sex Type Anes PTL Lv  4 Current           3 Term 03/24/19 [redacted]w[redacted]d 00:40 / 00:13 6 lb 3.3 oz (2.815 kg) M Vag-Spont EPI  LIV     Complications: Gestational diabetes, Elevated blood pressure reading  2 Term 02/22/16 [redacted]w[redacted]d 24:20 / 00:26 7 lb 9.3 oz (3.44 kg) M Vag-Spont EPI  LIV  1 SAB              Past Medical History:  Past Medical History:  Diagnosis Date   Acute pancreatitis 12/30/2022   Alcohol abuse 12/30/2022   Alcohol use disorder    Anxiety    Anxiety and depression 12/30/2022   Bipolar affective disorder, current episode mixed (HCC) 05/05/2021   Bipolar disorder (manic depression) (HCC) 02/12/2022   BV (bacterial vaginosis) 07/19/2022   Chronic hypertension during pregnancy 11/20/2018   [X]  Aspirin  81 mg daily after 12 weeks  Current antihypertensives:  None      Baseline  and surveillance labs (pulled in from [facility], refresh links as needed)           Lab Results      Component    Value    Date           PLT    315    11/20/2018           CREATININE    0.62    11/20/2018           AST    38    11/20/2018           ALT    11    11/20/2018           PROTCRRATIO              10/19/202   Chronic post-traumatic stress disorder (PTSD) 10/04/2022   Class 3 severe obesity due to excess calories without serious comorbidity with body mass index (BMI) of 45.0 to 49.9 in adult 05/05/2021   COVID-19 virus infection 12/05/2018   + again on 2/4 (asymptomatic)  postiive test in [facility] on 11-2   Depression    Depression    [employer]reesia 12/17/2019   Depression    [employer]rees[employer] 12/30/2019   Essential hypertension    Gestational diabetes mellitus (GDM)    Normal postpartum 2 hr GTT   Group B streptococcal infection  in pregnancy 03/13/2019   History of depression 08/29/2018   History of gestational diabetes 10/03/2019   Iron  deficiency anemia 01/18/2023   Prediabetes 05/05/2021   Psychophysiological insomnia 05/05/2021   Rh negative state in antepartum period 09/03/2015   [x]  needs rhogam at 28 weeks  [name] postive anti-D on initial lab. Patient received rhogam on 08/02/18 during [facility]U visit   Rh negative state in antepartum period 09/03/2015   [x]  needs rhogam at 28 weeks     [name] postive anti-D on initial lab. Patient received rhogam on 08/02/18 during [facility] visit     Rubella non-immune status, antepartum 09/03/2015   Severe episode of recurrent major depressive disorder, without psychotic features (HCC)    Suspected sleep apnea 01/18/2023   UTI (urinary tract infection)    Vaginal Pap smear, abnormal    Vitamin D  deficiency 01/18/2023     Past Surgical History:    Past Surgical History:  Procedure Laterality Date   BREAST SURGERY  2013   Lt & Rt excision juvenile fibroadenoma   MASS EXCISION  10/29/2011   Procedure: EXCISION MASS;  Surgeon: Rogena Class, MD;  Location: [facility]  [facility];  Service: General;  Laterality: Bilateral;  excision of bilateral breast masses     Home Medications:   Current Outpatient Medications on File Prior to Visit  Medication Sig Dispense Refill   acetaminophen  (TYLENOL ) 500 MG tablet Take 1,000 mg by mouth every 6 (six) hours as needed.     aspirin  EC 81 MG tablet Take 2 tablets (162 mg total) by mouth daily. Swallow whole. 300 tablet 2   Blood Pressure Monitoring (BLOOD PRESSURE KIT) DEVI 1 each by Does not apply route as needed. 1 each 0   Ferric Maltol  30 MG CAPS Take 1 capsule (30 mg total) by mouth 2 (two) times daily. Please take one hour before breakfast and dinner 60 capsule 10   FLUoxetine  (PROZAC ) 40 MG capsule Take 1 capsule (40 mg total) by mouth daily. 30 capsule 1   metoCLOPramide  (REGLAN ) 10 MG tablet Take 1 tablet (10 mg total) by mouth 4 (four) times daily. 120 tablet 3   Misc. Devices (GOJJI WEIGHT SCALE) MISC 1 Device by Does not apply route as needed. 1 each 0   ondansetron  (ZOFRAN ) 4 MG tablet Take 1 tablet (4 mg total) by mouth every 8 (eight) hours as needed for nausea or vomiting. 42 tablet 2   Prenatal MV & Min w/FA-DHA ([employer] PRENATAL GUMMY) 0.18-25 MG CHEW Chew 3 each by mouth daily. 90 tablet 11   promethazine  (PHENERGAN ) 25 MG tablet Take 1 tablet (25 mg total) by mouth every 6 (six) hours as needed for nausea or vomiting. 30 tablet 2   QUEtiapine  (SEROQUEL ) 200 MG tablet Take 1 tablet (200 mg total) by mouth at bedtime. 30 tablet 0   scopolamine  (TRANSDERM-SCOP) 1 MG/3DAYS Place 1 patch (1.5 mg total) onto the skin every 3 (three) days. 10 patch 12   ondansetron  (ZOFRAN -ODT) 4 MG disintegrating tablet Take 1 tablet (4 mg total) by mouth every 8 (eight) hours as needed for nausea. (Patient not taking: Reported on 06/24/2023) 42 tablet 2   No current facility-administered medications on file prior to visit.      Allergies:   No Known Allergies   Physical Exam:   Vitals:   07/12/23 1016  BP: 125/77  Pulse: 93    Sitting comfortably on the sonogram table Nonlabored breathing Normal rate and rhythm Abdomen is nontender  Thank you for  the opportunity to be involved with this patient's care. Please let us  know if we can be of any further assistance.   *** minutes of time was spent reviewing the patient's chart including labs, imaging and documentation.  At least 50% of this time was spent with direct patient care discussing the diagnosis, management and prognosis of her care.  Katrina Stewart MFM, [place]   07/12/2023  11:41 AM

## 2023-07-13 DIAGNOSIS — O283 Abnormal ultrasonic finding on antenatal screening of mother: Secondary | ICD-10-CM | POA: Insufficient documentation

## 2023-07-13 DIAGNOSIS — O43193 Other malformation of placenta, third trimester: Secondary | ICD-10-CM | POA: Insufficient documentation

## 2023-07-19 ENCOUNTER — Encounter

## 2023-07-21 ENCOUNTER — Telehealth (HOSPITAL_COMMUNITY): Admitting: Student

## 2023-07-21 DIAGNOSIS — F411 Generalized anxiety disorder: Secondary | ICD-10-CM

## 2023-07-21 DIAGNOSIS — F4312 Post-traumatic stress disorder, chronic: Secondary | ICD-10-CM | POA: Diagnosis not present

## 2023-07-21 DIAGNOSIS — F3160 Bipolar disorder, current episode mixed, unspecified: Secondary | ICD-10-CM

## 2023-07-21 DIAGNOSIS — F319 Bipolar disorder, unspecified: Secondary | ICD-10-CM | POA: Diagnosis not present

## 2023-07-21 MED ORDER — FLUOXETINE HCL 40 MG PO CAPS
40.0000 mg | ORAL_CAPSULE | Freq: Every day | ORAL | 1 refills | Status: DC
Start: 2023-07-21 — End: 2023-09-16

## 2023-07-21 MED ORDER — QUETIAPINE FUMARATE 200 MG PO TABS: 200.0000 mg | ORAL_TABLET | Freq: Every day | ORAL | 1 refills | Status: DC
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
## 2023-07-24 ENCOUNTER — Encounter: Payer: Self-pay | Admitting: Obstetrics & Gynecology

## 2023-07-26 ENCOUNTER — Other Ambulatory Visit: Payer: Self-pay | Admitting: *Deleted

## 2023-07-26 ENCOUNTER — Ambulatory Visit: Attending: Obstetrics and Gynecology

## 2023-07-26 ENCOUNTER — Ambulatory Visit: Admitting: Maternal & Fetal Medicine

## 2023-07-26 ENCOUNTER — Ambulatory Visit

## 2023-07-26 VITALS — BP 133/82 | HR 92

## 2023-07-26 DIAGNOSIS — D563 Thalassemia minor: Secondary | ICD-10-CM | POA: Diagnosis present

## 2023-07-26 DIAGNOSIS — O0992 Supervision of high risk pregnancy, unspecified, second trimester: Secondary | ICD-10-CM | POA: Insufficient documentation

## 2023-07-26 DIAGNOSIS — R188 Other ascites: Secondary | ICD-10-CM

## 2023-07-26 DIAGNOSIS — Z3A22 22 weeks gestation of pregnancy: Secondary | ICD-10-CM

## 2023-07-26 DIAGNOSIS — O43192 Other malformation of placenta, second trimester: Secondary | ICD-10-CM

## 2023-07-26 DIAGNOSIS — O99012 Anemia complicating pregnancy, second trimester: Secondary | ICD-10-CM | POA: Diagnosis not present

## 2023-07-26 DIAGNOSIS — O285 Abnormal chromosomal and genetic finding on antenatal screening of mother: Secondary | ICD-10-CM | POA: Insufficient documentation

## 2023-07-26 DIAGNOSIS — O99342 Other mental disorders complicating pregnancy, second trimester: Secondary | ICD-10-CM

## 2023-07-26 DIAGNOSIS — O358XX1 Maternal care for other (suspected) fetal abnormality and damage, fetus 1: Secondary | ICD-10-CM | POA: Diagnosis not present

## 2023-07-26 DIAGNOSIS — O30049 Twin pregnancy, dichorionic/diamniotic, unspecified trimester: Secondary | ICD-10-CM | POA: Diagnosis present

## 2023-07-26 DIAGNOSIS — O10919 Unspecified pre-existing hypertension complicating pregnancy, unspecified trimester: Secondary | ICD-10-CM

## 2023-07-26 DIAGNOSIS — O24419 Gestational diabetes mellitus in pregnancy, unspecified control: Secondary | ICD-10-CM

## 2023-07-26 DIAGNOSIS — O368990 Maternal care for other specified fetal problems, unspecified trimester, not applicable or unspecified: Secondary | ICD-10-CM

## 2023-07-26 DIAGNOSIS — F419 Anxiety disorder, unspecified: Secondary | ICD-10-CM

## 2023-07-26 DIAGNOSIS — Z148 Genetic carrier of other disease: Secondary | ICD-10-CM

## 2023-07-26 DIAGNOSIS — O360120 Maternal care for anti-D [Rh] antibodies, second trimester, not applicable or unspecified: Secondary | ICD-10-CM

## 2023-07-26 DIAGNOSIS — O30042 Twin pregnancy, dichorionic/diamniotic, second trimester: Secondary | ICD-10-CM | POA: Diagnosis present

## 2023-07-26 DIAGNOSIS — Z6791 Unspecified blood type, Rh negative: Secondary | ICD-10-CM

## 2023-07-26 DIAGNOSIS — O283 Abnormal ultrasonic finding on antenatal screening of mother: Secondary | ICD-10-CM

## 2023-07-26 NOTE — Progress Notes (Addendum)
 [facility] for Maternal Fetal Care at [facility] for [facility] [address], [address] Phone:  [phone]   Fax:  [phone]      In-Person Genetic Counseling Clinic Note:   I spoke with 28 y.o. Othel Hands today to discuss her genetic screening results. She was referred by [employer], CNM. She was accompanied by her mother.   Pregnancy History:    [id number]. EGA: [redacted]w[redacted]d by LMP. EDD: 11/29/2023. Patient reports an early SAB with no known underlying health or genetic causes. Denies major personal health concerns. Denies bleeding, infections, and fevers in this pregnancy. Denies using tobacco, alcohol, or street drugs in this pregnancy.   Family History:    A three-generation pedigree was created and scanned into Epic under the Media tab.  Patient reports her 63 yo son has autism and ADHD, no genetic testing performed. We discussed that we are unable to directly test for autism in a pregnancy. Genetic testing for individuals with a clinical diagnosis of autism yields an explanation in only about 20% of cases, and the remaining 80% of cases are left with unknown etiology. Having an affected child increased the chance that her other children will have autism; however, without genetic testing performed on affected family members, it is difficult to assess risk to the pregnancy and other family members. The risk may be up to 50% in the case of an identified genetic cause. We reviewed that the patient's fragile X carrier screening was negative.  She also reports her 34 yo son has anemia and is receiving further evaluation as his hemoglobin is consistently low.  Patient is encouraged to reach out with any reports if they are available so we can further assess recurrence risk.  FOB's family history is unknown.  Maternal ethnicity reported as Black and paternal ethnicity reported as unknown. Denies Ashkenazi Jewish ancestry.  Family history not remarkable for consanguinity,  individuals with birth defects, intellectual disability, autism spectrum disorder, multiple spontaneous abortions, still births, or unexplained neonatal death.   Low Fetal Fraction on NIPS:  Noninvasive prenatal screening (NIPS) analyzes cell-free DNA originating from the placenta that is found in the maternal blood circulation during pregnancy to provide information regarding the presence or absence of extra DNA for chromosomes 13, 18, and 21 as well as the sex chromosomes. Lauran's NIPS results from [facility] lab returned as no-calls due to low fetal fraction. Her first draw had a low fetal fraction of 3.6% and 2.2% (dizygotic twins) at [redacted]w[redacted]d. Her second draw had a low fetal fraction of 3.2% and 1.9% at [redacted]w[redacted]d gestation, and her third draw had a low fetal fraction of 5% and 2% at [redacted]w[redacted]d. Due to this low fetal fraction, the lab was not able to estimate risks for common chromosomal differences. Low fetal fraction can be associated with early gestational age, high maternal weight, suboptimal sample collection, pregnancies conceived using in vitro fertilization, use of certain medications such as blood thinners, certain maternal health conditions such as some autoimmune disorders, and fetal chromosomal abnormalities. Moreover, this result may be due to normal variation.  We discussed that it is currently unknown why Jashay has low fetal fraction; however, it may be related to high maternal weight and early gestational age when the sample was collected. However, it can be associated to a chromosome difference in the fetus, as pregnancies with aneuploidy can also result in low fetal fraction. We reviewed that it is not recommended to have repeat NIPS after three failed attempts. We reviewed  amniocentesis for prenatal diagnosis. The technical aspects, benefits, risks, and limitations were reviewed including the 1 in 500 risk for miscarriage.  The patient also had a maternal serum quad screen performed which  returned as low-risk (1/10,000) for Down syndrome and open neural tube defects. No risk assessment is made for trisomy 18 on twin pregnancies per the lab. We reviewed the differences between maternal serum screens and NIPS. NIPS is considered to have a higher detection rate and can screen for more chromosome conditions than maternal serum screens.  Abnormal Ultrasound Findings:  The patient met with Dr. William, MFM prior to genetic counseling to review the ultrasound findings in twin A. Twin A was noted to have fetal hydrops, two-vessel umbilical cord, and marginal cord insertion. There is suspected rupture of the bladder with the formation of urinary ascites, and pericardial effusion was noted. Please see ultrasound report for details. We reviewed that these findings can be suggestive of a genetic or chromosomal condition. They may also be due to multifactorial causes. We reviewed diagnostic testing prenatally or postnatally is the only way to confirm any genetic or chromosomal differences.  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 Carriers may have mild anemia. She screened negative for the other 13 conditions which significantly reduces but does not eliminate the chance of being a carrier for these conditions. Please see report for details.  We reviewed the genetics of alpha thalassemia, autosomal recessive mode of inheritance, and clinical features of these conditions. We reviewed that Asli will pass down one functional copy of the alpha globin gene and one deletion (-?) in each pregnancy. Therefore, this pregnancy is not at increased risk for hemoglobin Bart's due to four deletions of the alpha globin genes (--/--) regardless of her reproductive partner's carrier status. The pregnancy may be at increased risk for hemoglobin H disease (50%)  if her partner is an alpha thalassemia carrier in the cis configuration (--/??). We reviewed that this is more common in [place] Asian populations and less common in those with Black ancestry.  Given these results, we discussed and offered carrier screening for Camaya's reproductive partner. We reviewed the benefits and limitations of carrier screening and that it can detect most but not all carriers.  She declined at this time, as he is not available for testing. We discussed other options, including prenatal diagnosis through amniocentesis, [facility] single gene NIPS, and postnatal testing. The benefits, risks, and limitations of each were reviewed. We reviewed that the [place]  Newborn Screening (NBS) program will screen all newborn babies for cystic fibrosis, spinal muscular atrophy, hemoglobinopathies, and numerous other conditions but will not screen for alpha thalassemia.   Plan of Care:   Patient declined amniocentesis or single gene NIPS with fetal risk assessment for alpha thalassemia. She will reach out if she wishes to proceed with any testing. Referrals have been sent to [facility] for a fetal echocardiogram and consultation with MFM for a second opinion regarding the fetal ultrasound findings.   Informed consent was obtained. All questions were answered.   90 minutes were spent on the date of the encounter in service to the patient including preparation, face-to-face consultation, discussion of test reports and available next steps, pedigree construction, genetic risk assessment, documentation, and care coordination.    Thank you for sharing in the care of Ereka with us .  Please do not  hesitate to contact us  at [phone] if you have any questions.   Lauraine Bodily, MS, [facility] Certified Genetic Counselor   Genetic counseling student involved in appointment: No.  I provided general supervision for this patient and was immediately available for any patient care concerns.

## 2023-07-26 NOTE — Progress Notes (Signed)
 Patient information  Patient Name: Katrina Stewart  Patient MRN:   [id number]  Referring practice: MFM Referring Provider: [facility])  Problem List   Patient Active Problem List   Diagnosis Date Noted   Fetal hydrops 07/26/2023   Marginal insertion of umbilical cord affecting management of mother in third trimester (twin A) 07/13/2023   Twin A anomalies: marginal umbilical cord insertion, single umbilical artery,EIF, possible bladder outlet problem 07/13/2023   Anemia of pregnancy in second trimester 06/07/2023   Dichorionic diamniotic twin pregnancy 06/06/2023   Generalized anxiety disorder 05/17/2023   Alpha thalassemia silent carrier 05/03/2023   Supervision of high-risk pregnancy 05/03/2023   Suspected sleep apnea 01/18/2023   Chronic post-traumatic stress disorder (PTSD) 10/04/2022   Bipolar affective disorder, current episode mixed (HCC) 05/05/2021   Hx of Gestational diabetes mellitus (GDM), antepartum 01/08/2019   Hx of Chronic hypertension during pregnancy 11/20/2018   Rh negative state in antepartum period 09/03/2015    Maternal Fetal medicine Consult  Katrina Stewart is a 28 y.o. H5E7987 at [redacted]w[redacted]d here for ultrasound and consultation. Leonard Ordoyne is doing well today with no acute concerns. Today we focused on the following:   Twin A abnormalities: Fetal hydrops (pericardial effusion, ascites), two-vessel umbilical cord, marginal cord insertion: Two weeks ago there was concern for a keyhole bladder and posterior urethral valves for twin A.  This fetus also has a marginal umbilical cord insertion and a two-vessel cord.  Today there is evidence of fetal ascites with an absent bladder suggestive of bladder rupture with the formation of urinary ascites.  There is also a pericardial effusion that is new as of today.  Previously the cardiac structures were not suggestive of any structural or functional abnormalities.  I discussed the diagnosis of  fetal hydrops at 22 weeks is associated with a poor prognosis and a high risk of fetal demise especially if this is true hydrops.  However, if the ascites is due to bladder rupture from urinary outlet obstruction then the prognosis depends on the degree of pulmonary function, renal function and progression of the hydrops.  Thankfully the amniotic fluid is in the normal range which is a favorable finding suggestive of normal pulmonary function. I discussed the importance of a fetal echo to assess the cardiac structures as well as the role of termination of pregnancy if the patient would desire.  She is uncertain at this time what she would like to do.  I recommend that she obtain another consultation with Dr. Si at [facility].   The patient also has other medical co-morbidities but these were not discussed due to time constraints.   The patient had time to ask questions that were answered to her satisfaction.  She verbalized understanding and agrees to proceed with the plan below.  Sonographic findings Di-di intrauterine pregnancy at 22w 0d  Presentation: A: Transverse, head to maternal left, B: Breech. Fetal cardiac activity:  A: Observed, B: Observed and appears normal. The fetal anatomy: Twin A:  hydrops (pericardial effusion, ascites), two-vessel umbilical cord, marginal cord insertion.  The bladder is absent.  The abdominal ascites could represent urine accumulation from a ruptured bladder. Twin B: The anatomy that was well-visualized appears normal today. Fetal biometry (estimated fetal weight): A: 45, B: 19 percentile.  Twin A's abdominal circumference is artificially inflated due to the ascites and the growth discrepancy is likely not reflective of abnormal placental function. Placenta: A: Anterior, B: Posterior.  MVP: A: 2.83, B: 2.91.  MCA dopplers for twin A: 1.66 (<1 MoM).   There are limitations of prenatal ultrasound such as the inability to detect certain abnormalities due to poor  visualization. Various factors such as fetal position, gestational age and maternal body habitus may increase the difficulty in visualizing the fetal anatomy.    Recommendations - Limited ultrasounds with MCA Dopplers every week until the diagnosis and prognosis is more clear - Growth ultrasounds every 4 weeks - Urgent referral to [facility] for fetal echo as well as MFM consultation to discuss the role of termination of the patient desires   Review of Systems: A review of systems was performed and was negative except per HPI   Vitals and Physical Exam    07/26/2023    7:19 AM 07/12/2023   10:16 AM 07/05/2023    4:06 PM  Vitals with BMI  Weight   283 lbs 8 oz  BMI   41.85  Systolic 133 125 884  Diastolic 82 77 73  Pulse 92 93 109    Sitting comfortably on the sonogram table Nonlabored breathing Normal rate and rhythm Abdomen is nontender  Past pregnancies OB History  Gravida Para Term Preterm AB Living  4 2 2  1 2   SAB IAB Ectopic Multiple Live Births  1 0  0 2    # Outcome Date GA Lbr Len/2nd Weight Sex Type Anes PTL Lv  4 Current           3 Term 03/24/19 [redacted]w[redacted]d 00:40 / 00:13 6 lb 3.3 oz (2.815 kg) M Vag-Spont EPI  LIV     Complications: Gestational diabetes, Elevated blood pressure reading  2 Term 02/22/16 [redacted]w[redacted]d 24:20 / 00:26 7 lb 9.3 oz (3.44 kg) M Vag-Spont EPI  LIV  1 SAB              I spent 30 minutes reviewing the patients chart, including labs and images as well as counseling the patient about her medical conditions. Greater than 50% of the time was spent in direct face-to-face patient counseling.  Delora Smaller  MFM, [facility]   07/26/2023  10:11 AM

## 2023-07-28 ENCOUNTER — Encounter: Payer: Self-pay | Admitting: Emergency Medicine

## 2023-07-28 ENCOUNTER — Ambulatory Visit
Admission: EM | Admit: 2023-07-28 | Discharge: 2023-07-28 | Disposition: A | Discharge status: Home / Self Care | Attending: Physician Assistant | Admitting: Physician Assistant

## 2023-07-28 DIAGNOSIS — J069 Acute upper respiratory infection, unspecified: Secondary | ICD-10-CM | POA: Diagnosis not present

## 2023-07-28 DIAGNOSIS — J029 Acute pharyngitis, unspecified: Secondary | ICD-10-CM | POA: Insufficient documentation

## 2023-07-28 LAB — POC SARS CORONAVIRUS 2 AG -  ED: SARS Coronavirus 2 Ag: NEGATIVE

## 2023-07-28 LAB — POCT RAPID STREP A (OFFICE): Rapid Strep A Screen: NEGATIVE

## 2023-07-28 NOTE — ED Triage Notes (Signed)
 Pt reports sore throat and productive cough x4 days. Denies fever and chills. Has been taking mucinex  with little relief. Currently [redacted]wks pregnant.

## 2023-07-28 NOTE — ED Provider Notes (Signed)
 [facility]    CSN: [id number] Arrival date & time: 07/28/23  1206      History   Chief Complaint Chief Complaint  Patient presents with   Sore Throat   Cough    HPI Katrina Stewart is a 28 y.o. female.   Patient here today for evaluation of sore throat productive cough that started 4 days ago.  She has not any fever or chills.  She has been taking Mucinex  with little relief.  She is currently [redacted] weeks pregnant.  She does not report any vomiting or diarrhea.  The history is provided by the patient.  Sore Throat Pertinent negatives include no abdominal pain and no shortness of breath.  Cough Associated symptoms: sore throat   Associated symptoms: no chills, no ear pain, no eye discharge, no fever, no shortness of breath and no wheezing     Past Medical History:  Diagnosis Date   Acute pancreatitis 12/30/2022   Alcohol abuse 12/30/2022   Alcohol use disorder    Anxiety    Anxiety and depression 12/30/2022   Bipolar affective disorder, current episode mixed (HCC) 05/05/2021   Bipolar disorder (manic depression) (HCC) 02/12/2022   BV (bacterial vaginosis) 07/19/2022   Chronic hypertension during pregnancy 11/20/2018   [X]  Aspirin  81 mg daily after 12 weeks  Current antihypertensives:  None      Baseline and surveillance labs (pulled in from [facility], refresh links as needed)           Lab Results      Component    Value    Date           PLT    315    11/20/2018           CREATININE    0.62    11/20/2018           AST    38    11/20/2018           ALT    11    11/20/2018           PROTCRRATIO              10/19/202   Chronic post-traumatic stress disorder (PTSD) 10/04/2022   Class 3 severe obesity due to excess calories without serious comorbidity with body mass index (BMI) of 45.0 to 49.9 in adult 05/05/2021   COVID-19 virus infection 12/05/2018   + again on 2/4 (asymptomatic)  postiive test in MAU on 11-2   Depression    Depression    Phreesia 12/17/2019    Depression    Phreesia 12/30/2019   Essential hypertension    Gestational diabetes mellitus (GDM)    Normal postpartum 2 hr GTT   Group B streptococcal infection in pregnancy 03/13/2019   History of depression 08/29/2018   History of gestational diabetes 10/03/2019   Iron  deficiency anemia 01/18/2023   Prediabetes 05/05/2021   Psychophysiological insomnia 05/05/2021   Rh negative state in antepartum period 09/03/2015   [x]  needs rhogam at 28 weeks  [name] postive anti-D on initial lab. Patient received rhogam on 08/02/18 during MAU visit   Rh negative state in antepartum period 09/03/2015   [x]  needs rhogam at 28 weeks     [name] postive anti-D on initial lab. Patient received rhogam on 08/02/18 during MAU visit     Rubella non-immune status, antepartum 09/03/2015   Severe episode of recurrent major depressive disorder, without psychotic features (HCC)    Suspected sleep apnea 01/18/2023  UTI (urinary tract infection)    Vaginal Pap smear, abnormal    Vitamin D  deficiency 01/18/2023    Patient Active Problem List   Diagnosis Date Noted   Fetal hydrops 07/26/2023   Low fetal fraction NIPS 07/26/2023   Marginal insertion of umbilical cord affecting management of mother in third trimester (twin A) 07/13/2023   Twin A anomalies: marginal umbilical cord insertion, single umbilical artery,EIF, possible bladder outlet problem 07/13/2023   Anemia of pregnancy in second trimester 06/07/2023   Dichorionic diamniotic twin pregnancy 06/06/2023   Generalized anxiety disorder 05/17/2023   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   Supervision of high-risk pregnancy 05/03/2023   Suspected sleep apnea 01/18/2023   Chronic post-traumatic stress disorder (PTSD) 10/04/2022   Bipolar affective disorder, current episode mixed (HCC) 05/05/2021   Hx of Gestational diabetes mellitus (GDM), antepartum 01/08/2019   Hx of Chronic hypertension during pregnancy 11/20/2018   Rh negative state in antepartum  period 09/03/2015    Past Surgical History:  Procedure Laterality Date   BREAST SURGERY  2013   Lt & Rt excision juvenile fibroadenoma   MASS EXCISION  10/29/2011   Procedure: EXCISION MASS;  Surgeon: Vicenta DELENA Poli, MD;  Location: [facility];  Service: General;  Laterality: Bilateral;  excision of bilateral breast masses    OB History     Gravida  4   Para  2   Term  2   Preterm      AB  1   Living  2      SAB  1   IAB  0   Ectopic      Multiple  0   Live Births  2            Home Medications    Prior to Admission medications   Medication Sig Start Date End Date Taking? Authorizing Provider  aspirin  EC 81 MG tablet Take 2 tablets (162 mg total) by mouth daily. Swallow whole. 06/10/23  Yes Anyanwu, Gloris DELENA, MD  Blood Pressure Monitoring (BLOOD PRESSURE KIT) DEVI 1 each by Does not apply route as needed. 05/03/23  Yes Warren-Hill, Camie DELENA, CNM  Ferric Maltol  30 MG CAPS Take 1 capsule (30 mg total) by mouth 2 (two) times daily. Please take one hour before breakfast and dinner 06/07/23  Yes Anyanwu, Gloris DELENA, MD  FLUoxetine  (PROZAC ) 40 MG capsule Take 1 capsule (40 mg total) by mouth daily. 07/21/23 09/19/23 Yes Rainelle Pfeiffer, MD  metoCLOPramide  (REGLAN ) 10 MG tablet Take 1 tablet (10 mg total) by mouth 4 (four) times daily. 05/10/23  Yes Warren-Hill, Camie DELENA, CNM  ondansetron  (ZOFRAN ) 4 MG tablet Take 1 tablet (4 mg total) by mouth every 8 (eight) hours as needed for nausea or vomiting. 06/29/23  Yes Cresenzo, Norleen GAILS, MD  Prenatal MV & Min w/FA-DHA ([employer]S PRENATAL GUMMY) 0.18-25 MG CHEW Chew 3 each by mouth daily. 06/23/23  Yes Lola Donnice HERO, MD  promethazine  (PHENERGAN ) 25 MG tablet Take 1 tablet (25 mg total) by mouth every 6 (six) hours as needed for nausea or vomiting. 06/23/23  Yes Lola Donnice HERO, MD  QUEtiapine  (SEROQUEL ) 200 MG tablet Take 1 tablet (200 mg total) by mouth at bedtime. 07/21/23  Yes Rainelle Pfeiffer, MD  scopolamine  (TRANSDERM-SCOP) 1 MG/3DAYS  Place 1 patch (1.5 mg total) onto the skin every 3 (three) days. 05/08/23  Yes Cresenzo, Norleen GAILS, MD  acetaminophen  (TYLENOL ) 500 MG tablet Take 1,000 mg by mouth every 6 (six) hours as needed.  [provider]  Misc. Devices (GOJJI WEIGHT SCALE) MISC 1 Device by Does not apply route as needed. 05/03/23   Regino Camie LABOR, CNM    Family History Family History  Problem Relation Age of Onset   Diabetes Mother    Hypertension Mother    Other Father        doesn't know what he died from   Asthma Brother    Cancer Maternal Grandmother        lung cancer   Cancer Other        bladder cancer    Social History Social History   Tobacco Use   Smoking status: Never    Passive exposure: Never   Smokeless tobacco: Never  Vaping Use   Vaping status: Never Used  Substance Use Topics   Alcohol use: Not Currently    Alcohol/week: 2.0 - 3.0 standard drinks of alcohol    Types: 2 - 3 Shots of liquor per week    Comment: occasional   Drug use: No     Allergies   Patient has no known allergies.   Review of Systems Review of Systems  Constitutional:  Negative for chills and fever.  HENT:  Positive for congestion and sore throat. Negative for ear pain.   Eyes:  Negative for discharge and redness.  Respiratory:  Positive for cough. Negative for shortness of breath and wheezing.   Gastrointestinal:  Negative for abdominal pain, diarrhea, nausea and vomiting.     Physical Exam Triage Vital Signs ED Triage Vitals  Encounter Vitals Group     BP 07/28/23 1223 125/84     Girls Systolic BP Percentile --      Girls Diastolic BP Percentile --      Boys Systolic BP Percentile --      Boys Diastolic BP Percentile --      Pulse Rate 07/28/23 1223 92     Resp 07/28/23 1223 18     Temp 07/28/23 1223 98 F (36.7 C)     Temp Source 07/28/23 1223 Oral     SpO2 07/28/23 1223 98 %     Weight --      Height --      Head Circumference --      Peak Flow --      Pain Score 07/28/23  1224 7     Pain Loc --      Pain Education --      Exclude from Growth Chart --    No data found.  Updated Vital Signs BP 125/84 (BP Location: Left Arm)   Pulse 92   Temp 98 F (36.7 C) (Oral)   Resp 18   LMP 02/22/2023   SpO2 98%   Visual Acuity Right Eye Distance:   Left Eye Distance:   Bilateral Distance:    Right Eye Near:   Left Eye Near:    Bilateral Near:     Physical Exam Vitals and nursing note reviewed.  Constitutional:      General: She is not in acute distress.    Appearance: Normal appearance. She is not ill-appearing.  HENT:     Head: Normocephalic and atraumatic.     Right Ear: Tympanic membrane normal.     Left Ear: Tympanic membrane normal.     Nose: Congestion present.     Mouth/Throat:     Mouth: Mucous membranes are moist.     Pharynx: No oropharyngeal exudate or posterior oropharyngeal erythema.   Eyes:  Conjunctiva/sclera: Conjunctivae normal.    Cardiovascular:     Rate and Rhythm: Normal rate and regular rhythm.     Heart sounds: Normal heart sounds. No murmur heard. Pulmonary:     Effort: Pulmonary effort is normal. No respiratory distress.     Breath sounds: Normal breath sounds. No wheezing, rhonchi or rales.   Skin:    General: Skin is warm and dry.   Neurological:     Mental Status: She is alert.   Psychiatric:        Mood and Affect: Mood normal.        Behavior: Behavior normal.        Thought Content: Thought content normal.      UC Treatments / Results  Labs (all labs ordered are listed, but only abnormal results are displayed) Labs Reviewed  POCT RAPID STREP A (OFFICE) - Normal  POC SARS CORONAVIRUS 2 AG -  ED - Normal  CULTURE, GROUP A STREP [facility])    EKG   Radiology No results found.  Procedures Procedures (including critical care time)  Medications Ordered in UC Medications - No data to display  Initial Impression / Assessment and Plan / UC Course  I have reviewed the triage vital signs and  the nursing notes.  Pertinent labs & imaging results that were available during my care of the patient were reviewed by me and considered in my medical decision making (see chart for details).   COVID and strep screening negative.  Will order strep culture.  Suspect likely viral etiology of symptoms and recommended symptomatic treatment appropriate in pregnancy.  Encouraged follow-up if no gradual improvement with any worsening symptoms.   Final Clinical Impressions(s) / UC Diagnoses   Final diagnoses:  Acute pharyngitis, unspecified etiology  Acute upper respiratory infection   Discharge Instructions   None    ED Prescriptions   None    PDMP not reviewed this encounter.   Billy Asberry FALCON, PA-C 07/31/23 1229

## 2023-07-30 LAB — CULTURE, GROUP A STREP (THRC)

## 2023-08-01 ENCOUNTER — Ambulatory Visit: Payer: Self-pay

## 2023-08-01 DIAGNOSIS — Z315 Encounter for genetic counseling: Secondary | ICD-10-CM | POA: Insufficient documentation

## 2023-08-02 ENCOUNTER — Ambulatory Visit (HOSPITAL_COMMUNITY)

## 2023-08-03 ENCOUNTER — Encounter: Admitting: Obstetrics and Gynecology

## 2023-08-08 ENCOUNTER — Inpatient Hospital Stay (HOSPITAL_COMMUNITY)
Admission: AD | Admit: 2023-08-08 | Discharge: 2023-08-08 | Disposition: A | Discharge status: Home / Self Care | Attending: Obstetrics and Gynecology | Admitting: Obstetrics and Gynecology

## 2023-08-08 DIAGNOSIS — O3122X1 Continuing pregnancy after intrauterine death of one fetus or more, second trimester, fetus 1: Secondary | ICD-10-CM | POA: Diagnosis not present

## 2023-08-08 DIAGNOSIS — O10012 Pre-existing essential hypertension complicating pregnancy, second trimester: Secondary | ICD-10-CM | POA: Insufficient documentation

## 2023-08-08 DIAGNOSIS — Z3A23 23 weeks gestation of pregnancy: Secondary | ICD-10-CM | POA: Insufficient documentation

## 2023-08-08 DIAGNOSIS — O10919 Unspecified pre-existing hypertension complicating pregnancy, unspecified trimester: Secondary | ICD-10-CM

## 2023-08-08 DIAGNOSIS — O26892 Other specified pregnancy related conditions, second trimester: Secondary | ICD-10-CM | POA: Insufficient documentation

## 2023-08-08 DIAGNOSIS — O99012 Anemia complicating pregnancy, second trimester: Secondary | ICD-10-CM

## 2023-08-08 DIAGNOSIS — O368120 Decreased fetal movements, second trimester, not applicable or unspecified: Secondary | ICD-10-CM | POA: Diagnosis present

## 2023-08-08 DIAGNOSIS — O0992 Supervision of high risk pregnancy, unspecified, second trimester: Secondary | ICD-10-CM

## 2023-08-08 DIAGNOSIS — M549 Dorsalgia, unspecified: Secondary | ICD-10-CM | POA: Insufficient documentation

## 2023-08-08 DIAGNOSIS — D563 Thalassemia minor: Secondary | ICD-10-CM

## 2023-08-08 DIAGNOSIS — O99891 Other specified diseases and conditions complicating pregnancy: Secondary | ICD-10-CM | POA: Diagnosis not present

## 2023-08-08 DIAGNOSIS — O26899 Other specified pregnancy related conditions, unspecified trimester: Secondary | ICD-10-CM

## 2023-08-08 DIAGNOSIS — O24419 Gestational diabetes mellitus in pregnancy, unspecified control: Secondary | ICD-10-CM

## 2023-08-08 LAB — URINALYSIS, ROUTINE W REFLEX MICROSCOPIC
Bilirubin Urine: NEGATIVE
Glucose, UA: NEGATIVE mg/dL
Hgb urine dipstick: NEGATIVE
Ketones, ur: NEGATIVE mg/dL
Leukocytes,Ua: NEGATIVE
Nitrite: NEGATIVE
Protein, ur: NEGATIVE mg/dL
Specific Gravity, Urine: 1.017 (ref 1.005–1.030)
pH: 5 (ref 5.0–8.0)

## 2023-08-08 MED ORDER — CYCLOBENZAPRINE HCL 10 MG PO TABS: 10.0000 mg | ORAL_TABLET | Freq: Two times a day (BID) | ORAL | 0 refills | Status: DC | PRN

## 2023-08-08 NOTE — MAU Note (Signed)
 Katrina Stewart is a 28 y.o. at [redacted]w[redacted]d here in MAU reporting: has not felt babies move much since yesterday. C/o back pain, swelling in hands and feet and headache has taken tylenol  for the headache and back ache but has not helped.  Denies any vag bleeding or discharge.   LMP:  Onset of complaint: 2 days Pain score: 7-8 Vitals:   08/08/23 1403  BP: (!) 140/75  Pulse: (!) 114  Resp: 18  Temp: 98.8 F (37.1 C)     FHT: did not attempt in triage  Lab orders placed from triage: u/a

## 2023-08-08 NOTE — MAU Provider Note (Signed)
 History     [id number]  Arrival date and time: 08/08/23 1348    Chief Complaint  Patient presents with   Decreased Fetal Movement   Headache   Back Pain     HPI Katrina Stewart is a 28 y.o. at [redacted]w[redacted]d by 7 wk US  with PMHx notable for Di/di twin gestation with IUFD of twin A which had multiple abnormalities, cHTN, Rh neg, PSTD, GDM in prior pregnancy, who presents for decreased fetal movement and back pain.   Prior to arrival in MAU had been having about a day's worth of DFM Currently though it has resolved, feeling good movement  Has also been having back pain Has been present for the past three days Worse on first day, couldn't sit or sleep or get comfortable Tried tylenol  at that time but didn't have any effect Mostly went away the next day but then last night began to have it again Pain is constant but waxes and wanes in intensity Worse with movement or walking Nothing she has noticed specifically will improve the pain Pain is in bilateral lower back, does not wrap around to the front Also endorses some period like cramping in her pelvic area No vaginal bleeding or leaking fluid No burning or pain with urination No vaginal odor, itching, or pain    B/Negative/-- (04/08 1640)  OB History     Gravida  4   Para  2   Term  2   Preterm      AB  1   Living  2      SAB  1   IAB  0   Ectopic      Multiple  0   Live Births  2           Past Medical History:  Diagnosis Date   Acute pancreatitis 12/30/2022   Alcohol abuse 12/30/2022   Alcohol use disorder    Anxiety    Anxiety and depression 12/30/2022   Bipolar affective disorder, current episode mixed (HCC) 05/05/2021   Bipolar disorder (manic depression) (HCC) 02/12/2022   BV (bacterial vaginosis) 07/19/2022   Chronic hypertension during pregnancy 11/20/2018   [X]  Aspirin  81 mg daily after 12 weeks  Current antihypertensives:  None      Baseline and surveillance labs (pulled in from [facility],  refresh links as needed)           Lab Results      Component    Value    Date           PLT    315    11/20/2018           CREATININE    0.62    11/20/2018           AST    38    11/20/2018           ALT    11    11/20/2018           PROTCRRATIO              10/19/202   Chronic post-traumatic stress disorder (PTSD) 10/04/2022   Class 3 severe obesity due to excess calories without serious comorbidity with body mass index (BMI) of 45.0 to 49.9 in adult 05/05/2021   COVID-19 virus infection 12/05/2018   + again on 2/4 (asymptomatic)  postiive test in MAU on 11-2   Depression    Depression    Phreesia 12/17/2019   Depression  Phreesia 12/30/2019   Essential hypertension    Gestational diabetes mellitus (GDM)    Normal postpartum 2 hr GTT   Group B streptococcal infection in pregnancy 03/13/2019   History of depression 08/29/2018   History of gestational diabetes 10/03/2019   Iron  deficiency anemia 01/18/2023   Prediabetes 05/05/2021   Psychophysiological insomnia 05/05/2021   Rh negative state in antepartum period 09/03/2015   [x]  needs rhogam at 28 weeks  [name] postive anti-D on initial lab. Patient received rhogam on 08/02/18 during MAU visit   Rh negative state in antepartum period 09/03/2015   [x]  needs rhogam at 28 weeks     [name] postive anti-D on initial lab. Patient received rhogam on 08/02/18 during MAU visit     Rubella non-immune status, antepartum 09/03/2015   Severe episode of recurrent major depressive disorder, without psychotic features (HCC)    Suspected sleep apnea 01/18/2023   UTI (urinary tract infection)    Vaginal Pap smear, abnormal    Vitamin D  deficiency 01/18/2023    Past Surgical History:  Procedure Laterality Date   BREAST SURGERY  2013   Lt & Rt excision juvenile fibroadenoma   MASS EXCISION  10/29/2011   Procedure: EXCISION MASS;  Surgeon: Vicenta DELENA Poli, MD;  Location: [facility];  Service: General;  Laterality: Bilateral;  excision of bilateral  breast masses    Family History  Problem Relation Age of Onset   Diabetes Mother    Hypertension Mother    Other Father        doesn't know what he died from   Asthma Brother    Cancer Maternal Grandmother        lung cancer   Cancer Other        bladder cancer    Social History   Socioeconomic History   Marital status: Single    Spouse name: Not on file   Number of children: Not on file   Years of education: Not on file   Highest education level: Some college, no degree  Occupational History    Comment: unemployed  Tobacco Use   Smoking status: Never    Passive exposure: Never   Smokeless tobacco: Never  Vaping Use   Vaping status: Never Used  Substance and Sexual Activity   Alcohol use: Not Currently    Alcohol/week: 2.0 - 3.0 standard drinks of alcohol    Types: 2 - 3 Shots of liquor per week    Comment: occasional   Drug use: No   Sexual activity: Not Currently    Birth control/protection: None  Other Topics Concern   Not on file  Social History Narrative   Not on file   Social Drivers of Health   Financial Resource Strain: Low Risk  (02/08/2023)   Overall Financial Resource Strain (CARDIA)    Difficulty of Paying Living Expenses: Not hard at all  Food Insecurity: No Food Insecurity (05/10/2023)   Hunger Vital Sign    Worried About Running Out of Food in the Last Year: Never true    Ran Out of Food in the Last Year: Never true  Transportation Needs: No Transportation Needs (05/10/2023)   PRAPARE - Administrator, Civil Service (Medical): No    Lack of Transportation (Non-Medical): No  Physical Activity: Insufficiently Active (02/08/2023)   Exercise Vital Sign    Days of Exercise per Week: 5 days    Minutes of Exercise per Session: 20 min  Stress: Stress Concern Present (02/08/2023)  [employer] of Occupational Health - Occupational Stress Questionnaire    Feeling of Stress : Very much  Social Connections: Moderately Isolated (02/08/2023)    Social Connection and Isolation Panel    Frequency of Communication with Friends and Family: More than three times a week    Frequency of Social Gatherings with Friends and Family: More than three times a week    Attends Religious Services: 1 to 4 times per year    Active Member of [employer] or Organizations: No    Attends Engineer, structural: Not on file    Marital Status: Never married  Intimate Partner Violence: Not At Risk (12/30/2022)   Humiliation, Afraid, Rape, and Kick questionnaire    Fear of Current or Ex-Partner: No    Emotionally Abused: No    Physically Abused: No    Sexually Abused: No    No Known Allergies  No current facility-administered medications on file prior to encounter.   Current Outpatient Medications on File Prior to Encounter  Medication Sig Dispense Refill   acetaminophen  (TYLENOL ) 500 MG tablet Take 1,000 mg by mouth every 6 (six) hours as needed.     aspirin  EC 81 MG tablet Take 2 tablets (162 mg total) by mouth daily. Swallow whole. 300 tablet 2   Blood Pressure Monitoring (BLOOD PRESSURE KIT) DEVI 1 each by Does not apply route as needed. 1 each 0   Ferric Maltol  30 MG CAPS Take 1 capsule (30 mg total) by mouth 2 (two) times daily. Please take one hour before breakfast and dinner 60 capsule 10   FLUoxetine  (PROZAC ) 40 MG capsule Take 1 capsule (40 mg total) by mouth daily. 30 capsule 1   metoCLOPramide  (REGLAN ) 10 MG tablet Take 1 tablet (10 mg total) by mouth 4 (four) times daily. 120 tablet 3   Misc. Devices (GOJJI WEIGHT SCALE) MISC 1 Device by Does not apply route as needed. 1 each 0   ondansetron  (ZOFRAN ) 4 MG tablet Take 1 tablet (4 mg total) by mouth every 8 (eight) hours as needed for nausea or vomiting. 42 tablet 2   Prenatal MV & Min w/FA-DHA ([employer] PRENATAL GUMMY) 0.18-25 MG CHEW Chew 3 each by mouth daily. 90 tablet 11   promethazine  (PHENERGAN ) 25 MG tablet Take 1 tablet (25 mg total) by mouth every 6 (six) hours as needed for nausea or  vomiting. 30 tablet 2   QUEtiapine  (SEROQUEL ) 200 MG tablet Take 1 tablet (200 mg total) by mouth at bedtime. 30 tablet 1   scopolamine  (TRANSDERM-SCOP) 1 MG/3DAYS Place 1 patch (1.5 mg total) onto the skin every 3 (three) days. 10 patch 12     ROS Pertinent positives and negative per HPI, all others reviewed and negative  Physical Exam   BP (!) 141/80   Pulse (!) 110   Temp 98.3 F (36.8 C) (Oral)   Resp 18   Ht 5' 9 (1.753 m)   Wt (!) 137.9 kg   LMP 02/22/2023   BMI 44.89 kg/m   Patient Vitals for the past 24 hrs:  BP Temp Temp src Pulse Resp Height Weight  08/08/23 1500 -- 98.3 F (36.8 C) Oral -- -- -- --  08/08/23 1446 (!) 141/80 -- -- (!) 110 -- -- --  08/08/23 1431 139/79 -- -- (!) 120 -- -- --  08/08/23 1426 133/78 -- -- (!) 108 -- -- --  08/08/23 1422 138/85 -- -- (!) 111 -- -- --  08/08/23 1403 (!) 140/75 98.8 F (37.1  C) -- (!) 114 18 5' 9 (1.753 m) (!) 137.9 kg    Physical Exam Vitals reviewed.  Constitutional:      General: She is not in acute distress.    Appearance: She is well-developed. She is not diaphoretic.  Eyes:     General: No scleral icterus. Pulmonary:     Effort: Pulmonary effort is normal. No respiratory distress.  Abdominal:     General: There is no distension.     Palpations: Abdomen is soft.     Tenderness: There is no abdominal tenderness. There is no guarding or rebound.  Musculoskeletal:     Comments: Paraspinal spasm and tenderness to palpation in lumbar area, none in thoracic area  Skin:    General: Skin is warm and dry.  Neurological:     Mental Status: She is alert.     Coordination: Coordination normal.      Cervical Exam Dilation: Closed Exam by:: Lola MD  Bedside Ultrasound Not performed.  My interpretation: n/a  FHT Baseline: 160 bpm Variability: Good {> 6 bpm) Accelerations: Reactive Decelerations: Absent Uterine activity: None Cat: I  Labs No results found for this or any previous visit (from  the past 24 hours).  Imaging No results found.  MAU Course  Procedures Lab Orders         Urinalysis, Routine w reflex microscopic -Urine, Clean Catch    Meds ordered this encounter  Medications   cyclobenzaprine  (FLEXERIL ) 10 MG tablet    Sig: Take 1 tablet (10 mg total) by mouth 2 (two) times daily as needed for muscle spasms.    Dispense:  20 tablet    Refill:  0   Imaging Orders  No imaging studies ordered today    MDM Moderate (Level 3-4)  Assessment and Plan  #Back pain in pregnancy #[redacted] weeks gestation of pregnancy Cervix closed on exam. No urinary or vaginal symptoms to suggest infectious etiology. MSK exam with notable spasm of lumbar paraspinal muscles. Symptoms most likely due to this and advancing gestational age, low suspicion for preterm labor given closed cervix, no contractions on toco, etc. Trial flexeril .   #Decreased fetal movement, second trimester Resolved. Reactive NST.   #FWB FHT Cat I NST: Reactive   Dispo: discharged to home in stable condition    Donnice CHRISTELLA Lola, MD/MPH 08/08/23 3:11 PM  Allergies as of 08/08/2023   No Known Allergies      Medication List     TAKE these medications    acetaminophen  500 MG tablet Commonly known as: TYLENOL  Take 1,000 mg by mouth every 6 (six) hours as needed.   aspirin  EC 81 MG tablet Take 2 tablets (162 mg total) by mouth daily. Swallow whole.   Blood Pressure Kit Devi 1 each by Does not apply route as needed.   CVS Prenatal Gummy 0.18-25 MG Chew Chew 3 each by mouth daily.   cyclobenzaprine  10 MG tablet Commonly known as: FLEXERIL  Take 1 tablet (10 mg total) by mouth 2 (two) times daily as needed for muscle spasms.   Ferric Maltol  30 MG Caps Take 1 capsule (30 mg total) by mouth 2 (two) times daily. Please take one hour before breakfast and dinner   FLUoxetine  40 MG capsule Commonly known as: PROZAC  Take 1 capsule (40 mg total) by mouth daily.   Gojji Weight Scale Misc 1 Device  by Does not apply route as needed.   metoCLOPramide  10 MG tablet Commonly known as: Reglan  Take 1 tablet (10 mg total)  by mouth 4 (four) times daily.   ondansetron  4 MG tablet Commonly known as: Zofran  Take 1 tablet (4 mg total) by mouth every 8 (eight) hours as needed for nausea or vomiting.   promethazine  25 MG tablet Commonly known as: PHENERGAN  Take 1 tablet (25 mg total) by mouth every 6 (six) hours as needed for nausea or vomiting.   QUEtiapine  200 MG tablet Commonly known as: SEROQUEL  Take 1 tablet (200 mg total) by mouth at bedtime.   scopolamine  1 MG/3DAYS Commonly known as: TRANSDERM-SCOP Place 1 patch (1.5 mg total) onto the skin every 3 (three) days.

## 2023-08-08 NOTE — MAU Note (Signed)
 Pt is a G4P2 at 23.6 weeks with TIUP with IUFD of twin A on 2 weeks ago.  Reports that since then had had increased back pain and swelling with activity.  Reports that back pain is now constant.    Denies LOF or VB, some DFM of twin B but can feel increased movement in MAU.

## 2023-08-15 DIAGNOSIS — O99210 Obesity complicating pregnancy, unspecified trimester: Secondary | ICD-10-CM | POA: Insufficient documentation

## 2023-08-17 DIAGNOSIS — O98512 Other viral diseases complicating pregnancy, second trimester: Secondary | ICD-10-CM | POA: Insufficient documentation

## 2023-08-24 DIAGNOSIS — Z634 Disappearance and death of family member: Secondary | ICD-10-CM | POA: Insufficient documentation

## 2023-08-25 ENCOUNTER — Telehealth: Payer: Self-pay

## 2023-08-25 ENCOUNTER — Ambulatory Visit

## 2023-08-25 ENCOUNTER — Encounter: Admitting: Obstetrics and Gynecology

## 2023-08-25 DIAGNOSIS — O99210 Obesity complicating pregnancy, unspecified trimester: Secondary | ICD-10-CM

## 2023-08-25 NOTE — Telephone Encounter (Signed)
 Patient was called and left message in regards to scheduling her bp check in on week, in person mood check with a provider, and a postpartum visit.

## 2023-08-26 ENCOUNTER — Ambulatory Visit

## 2023-08-26 ENCOUNTER — Other Ambulatory Visit: Payer: Self-pay

## 2023-08-26 DIAGNOSIS — O10919 Unspecified pre-existing hypertension complicating pregnancy, unspecified trimester: Secondary | ICD-10-CM

## 2023-08-26 DIAGNOSIS — Z4889 Encounter for other specified surgical aftercare: Secondary | ICD-10-CM

## 2023-08-26 NOTE — Progress Notes (Signed)
 Incision and BP Check Visit  Katrina Stewart is here for incision and blood pressure check following primary c-section on 09/20/23 at [facility].  Infant A was known IUFD and Baby B demised at 3 days of life.    Blood pressure today 144/98, she reports intermittent headache and dizziness.  Pt denies any visual disturbances, chest pain, or shortness of breath.  She reports [facility] prescribed labetalol 200mg  q 12 hours at discharge but she has not yet picked up the medication.  Wound Assessment: Steri strips removed by RN with no issue.  Wound appears dry, approximated, no signs of infection.  Education: Reviewed good wound care and s/s of infection with patient. Pt states she will pick up Labetalol prescription today and agreeable to follow up with Dr. Lola in one week.  Advised pt be seen at [facility] of [facility] for any increasing symptoms of high blood pressure or other urgent concerns. RN inquired if pt has had any issues with milk supply or breast discomfort since delivery, she denies any complaints.  Advised pt that [facility] has LC assistance if she develops any issue.  RN offered [facility] resources for grief, pt appreciative but states she has a therapist for support at this time.  Follow up plan to address BP reviewed with Dr. Lola. Patient will follow up for BP recheck on 09/02/23 at 9:15 with Dr. Lola. Postpartum visit on 09/20/23 at 10:55 with Dr. Nicholaus.  Waddell LITTIE Burows, RN

## 2023-08-31 ENCOUNTER — Inpatient Hospital Stay (HOSPITAL_COMMUNITY)
Admission: AD | Admit: 2023-08-31 | Discharge: 2023-08-31 | Disposition: A | Discharge status: Home / Self Care | Attending: Obstetrics and Gynecology | Admitting: Obstetrics and Gynecology

## 2023-08-31 ENCOUNTER — Encounter (HOSPITAL_COMMUNITY): Payer: Self-pay | Admitting: Obstetrics and Gynecology

## 2023-08-31 ENCOUNTER — Other Ambulatory Visit: Payer: Self-pay

## 2023-08-31 DIAGNOSIS — F411 Generalized anxiety disorder: Secondary | ICD-10-CM | POA: Insufficient documentation

## 2023-08-31 DIAGNOSIS — G44209 Tension-type headache, unspecified, not intractable: Secondary | ICD-10-CM | POA: Diagnosis not present

## 2023-08-31 DIAGNOSIS — O1093 Unspecified pre-existing hypertension complicating the puerperium: Secondary | ICD-10-CM | POA: Diagnosis not present

## 2023-08-31 DIAGNOSIS — O9081 Anemia of the puerperium: Secondary | ICD-10-CM

## 2023-08-31 DIAGNOSIS — O10919 Unspecified pre-existing hypertension complicating pregnancy, unspecified trimester: Secondary | ICD-10-CM

## 2023-08-31 DIAGNOSIS — F4312 Post-traumatic stress disorder, chronic: Secondary | ICD-10-CM | POA: Diagnosis not present

## 2023-08-31 DIAGNOSIS — O99215 Obesity complicating the puerperium: Secondary | ICD-10-CM | POA: Diagnosis not present

## 2023-08-31 DIAGNOSIS — Z79899 Other long term (current) drug therapy: Secondary | ICD-10-CM | POA: Diagnosis not present

## 2023-08-31 DIAGNOSIS — F319 Bipolar disorder, unspecified: Secondary | ICD-10-CM | POA: Insufficient documentation

## 2023-08-31 DIAGNOSIS — Z98891 History of uterine scar from previous surgery: Secondary | ICD-10-CM | POA: Insufficient documentation

## 2023-08-31 LAB — COMPREHENSIVE METABOLIC PANEL WITH GFR
ALT: 16 U/L (ref 0–44)
AST: 20 U/L (ref 15–41)
Albumin: 3.4 g/dL — ABNORMAL LOW (ref 3.5–5.0)
Alkaline Phosphatase: 60 U/L (ref 38–126)
Anion gap: 9 (ref 5–15)
BUN: 10 mg/dL (ref 6–20)
CO2: 22 mmol/L (ref 22–32)
Calcium: 9 mg/dL (ref 8.9–10.3)
Chloride: 104 mmol/L (ref 98–111)
Creatinine, Ser: 0.74 mg/dL (ref 0.44–1.00)
GFR, Estimated: >60 mL/min (ref >60)
Glucose, Bld: 92 mg/dL (ref 70–99)
Potassium: 3.6 mmol/L (ref 3.5–5.1)
Sodium: 135 mmol/L (ref 135–145)
Total Bilirubin: 0.4 mg/dL (ref 0.0–1.2)
Total Protein: 7.4 g/dL (ref 6.5–8.1)

## 2023-08-31 LAB — CBC
HCT: 31.4 % — ABNORMAL LOW (ref 36.0–46.0)
Hemoglobin: 9.1 g/dL — ABNORMAL LOW (ref 12.0–15.0)
MCH: 19.4 pg — ABNORMAL LOW (ref 26.0–34.0)
MCHC: 29 g/dL — ABNORMAL LOW (ref 30.0–36.0)
MCV: 66.8 fL — ABNORMAL LOW (ref 80.0–100.0)
Platelets: 393 K/uL (ref 150–400)
RBC: 4.7 MIL/uL (ref 3.87–5.11)
RDW: 18.6 % — ABNORMAL HIGH (ref 11.5–15.5)
WBC: 6.4 K/uL (ref 4.0–10.5)
nRBC: 0 % (ref 0.0–0.2)

## 2023-08-31 MED ORDER — LABETALOL HCL 100 MG PO TABS
300.0000 mg | ORAL_TABLET | Freq: Once | ORAL | Status: AC
  Administered 2023-08-31: 300 mg via ORAL
  Filled 2023-08-31: qty 3

## 2023-08-31 MED ORDER — LABETALOL HCL 300 MG PO TABS: 300.0000 mg | ORAL_TABLET | Freq: Two times a day (BID) | ORAL | 0 refills | Status: DC

## 2023-08-31 MED ORDER — IBUPROFEN 800 MG PO TABS
800.0000 mg | ORAL_TABLET | Freq: Three times a day (TID) | ORAL | 0 refills | Status: DC | PRN
Start: 2023-08-31 — End: 2023-10-28

## 2023-08-31 MED ORDER — IBUPROFEN 800 MG PO TABS
800.0000 mg | ORAL_TABLET | Freq: Once | ORAL | Status: AC
  Administered 2023-08-31: 800 mg via ORAL
  Filled 2023-08-31: qty 1

## 2023-08-31 MED ORDER — CYCLOBENZAPRINE HCL 10 MG PO TABS: 10.0000 mg | ORAL_TABLET | Freq: Three times a day (TID) | ORAL | 0 refills | Status: DC | PRN

## 2023-08-31 MED ORDER — FERROUS SULFATE 325 (65 FE) MG PO TABS: 325.0000 mg | ORAL_TABLET | ORAL | 0 refills | Status: DC

## 2023-08-31 MED ORDER — CYCLOBENZAPRINE HCL 5 MG PO TABS
10.0000 mg | ORAL_TABLET | Freq: Once | ORAL | Status: AC
  Administered 2023-08-31: 10 mg via ORAL
  Filled 2023-08-31: qty 2

## 2023-08-31 NOTE — MAU Provider Note (Signed)
 MAU Provider Note  Chief Complaint: Hypertension   Event Date/Time   First Provider Initiated Contact with Patient 08/31/23 1628      SUBJECTIVE HPI: Katrina Stewart is a 28 y.o. [id number] at POD#11 from pLTCS at [redacted]w[redacted]d who presents to maternity admissions reporting HBP, HA. Pregnancy c/b di-di twins with IUFD of twin A/demise of twin B after birth, bipolar disorder, cHTN, Rh neg. Receives [facility] with [facility]. Delivered at [facility].  Patient presents with elevated Bps and headache. She did not require any blood pressure medications during pregnancy, however started on labetalol  100 mg BID postpartum for rising pressures. At time of discharge, she did not have s/sx of preE. Since has been taking labetalol  with continued mild range pressures. Headache on and off for past several days. Across temples. Denies vision changes, RUQ pain, edema, CP, SOB. Has only been taking tylenol  for headache.  Minimal lochia. Urinating well. Having Bms. Mild pain at incision. Denies urinary symptoms.  HPI  Past Medical History:  Diagnosis Date   Acute pancreatitis 12/30/2022   Alcohol abuse 12/30/2022   Alcohol use disorder    Anxiety    Anxiety and depression 12/30/2022   Bipolar affective disorder, current episode mixed (HCC) 05/05/2021   Bipolar disorder (manic depression) (HCC) 02/12/2022   BV (bacterial vaginosis) 07/19/2022   Chronic hypertension during pregnancy 11/20/2018   [X]  Aspirin  81 mg daily after 12 weeks  Current antihypertensives:  None      Baseline and surveillance labs (pulled in from [facility], refresh links as needed)           Lab Results      Component    Value    Date           PLT    315    11/20/2018           CREATININE    0.62    11/20/2018           AST    38    11/20/2018           ALT    11    11/20/2018           PROTCRRATIO              10/19/202   Chronic post-traumatic stress disorder (PTSD) 10/04/2022   Class 3 severe obesity due to excess calories without serious comorbidity with  body mass index (BMI) of 45.0 to 49.9 in adult 05/05/2021   COVID-19 virus infection 12/05/2018   + again on 2/4 (asymptomatic)  postiive test in MAU on 11-2   Depression    Depression    Phreesia 12/17/2019   Depression    Phreesia 12/30/2019   Essential hypertension    Gestational diabetes mellitus (GDM)    Normal postpartum 2 hr GTT   Group B streptococcal infection in pregnancy 03/13/2019   History of depression 08/29/2018   History of gestational diabetes 10/03/2019   Iron  deficiency anemia 01/18/2023   Prediabetes 05/05/2021   Psychophysiological insomnia 05/05/2021   Rh negative state in antepartum period 09/03/2015   [x]  needs rhogam at 28 weeks  [name] postive anti-D on initial lab. Patient received rhogam on 08/02/18 during MAU visit   Rh negative state in antepartum period 09/03/2015   [x]  needs rhogam at 28 weeks     [name] postive anti-D on initial lab. Patient received rhogam on 08/02/18 during MAU visit     Rubella non-immune status, antepartum 09/03/2015   Severe  episode of recurrent major depressive disorder, without psychotic features (HCC)    Suspected sleep apnea 01/18/2023   UTI (urinary tract infection)    Vaginal Pap smear, abnormal    Vitamin D  deficiency 01/18/2023   Past Surgical History:  Procedure Laterality Date   BREAST SURGERY  2013   Lt & Rt excision juvenile fibroadenoma   MASS EXCISION  10/29/2011   Procedure: EXCISION MASS;  Surgeon: Vicenta DELENA Poli, MD;  Location: [facility];  Service: General;  Laterality: Bilateral;  excision of bilateral breast masses   Social History   Socioeconomic History   Marital status: Single    Spouse name: Not on file   Number of children: Not on file   Years of education: Not on file   Highest education level: Some college, no degree  Occupational History    Comment: unemployed  Tobacco Use   Smoking status: Never    Passive exposure: Never   Smokeless tobacco: Never  Vaping Use   Vaping status: Never  Used  Substance and Sexual Activity   Alcohol use: Not Currently    Alcohol/week: 2.0 - 3.0 standard drinks of alcohol    Types: 2 - 3 Shots of liquor per week    Comment: occasional   Drug use: No   Sexual activity: Not Currently    Birth control/protection: None  Other Topics Concern   Not on file  Social History Narrative   Not on file   Social Drivers of Health   Financial Resource Strain: Low Risk  (02/08/2023)   Overall Financial Resource Strain (CARDIA)    Difficulty of Paying Living Expenses: Not hard at all  Food Insecurity: No Food Insecurity (08/31/2023)   Hunger Vital Sign    Worried About Running Out of Food in the Last Year: Never true    Ran Out of Food in the Last Year: Never true  Transportation Needs: No Transportation Needs (08/31/2023)   PRAPARE - Administrator, Civil Service (Medical): No    Lack of Transportation (Non-Medical): No  Physical Activity: Insufficiently Active (02/08/2023)   Exercise Vital Sign    Days of Exercise per Week: 5 days    Minutes of Exercise per Session: 20 min  Stress: Stress Concern Present (02/08/2023)   [employer] of Occupational Health - Occupational Stress Questionnaire    Feeling of Stress : Very much  Social Connections: Moderately Isolated (02/08/2023)   Social Connection and Isolation Panel    Frequency of Communication with Friends and Family: More than three times a week    Frequency of Social Gatherings with Friends and Family: More than three times a week    Attends Religious Services: 1 to 4 times per year    Active Member of [employer] or Organizations: No    Attends Engineer, structural: Not on file    Marital Status: Never married  Intimate Partner Violence: Not At Risk (08/31/2023)   Humiliation, Afraid, Rape, and Kick questionnaire    Fear of Current or Ex-Partner: No    Emotionally Abused: No    Physically Abused: No    Sexually Abused: No   No current facility-administered medications on  file prior to encounter.   Current Outpatient Medications on File Prior to Encounter  Medication Sig Dispense Refill   acetaminophen  (TYLENOL ) 500 MG tablet Take 1,000 mg by mouth every 6 (six) hours as needed.     aspirin  EC 81 MG tablet Take 2 tablets (162 mg total) by mouth  daily. Swallow whole. (Patient not taking: Reported on 08/26/2023) 300 tablet 2   Blood Pressure Monitoring (BLOOD PRESSURE KIT) DEVI 1 each by Does not apply route as needed. 1 each 0   cyclobenzaprine  (FLEXERIL ) 10 MG tablet Take 1 tablet (10 mg total) by mouth 2 (two) times daily as needed for muscle spasms. (Patient not taking: Reported on 08/26/2023) 20 tablet 0   Ferric Maltol  30 MG CAPS Take 1 capsule (30 mg total) by mouth 2 (two) times daily. Please take one hour before breakfast and dinner (Patient not taking: Reported on 08/26/2023) 60 capsule 10   FLUoxetine  (PROZAC ) 40 MG capsule Take 1 capsule (40 mg total) by mouth daily. 30 capsule 1   metoCLOPramide  (REGLAN ) 10 MG tablet Take 1 tablet (10 mg total) by mouth 4 (four) times daily. (Patient not taking: Reported on 08/26/2023) 120 tablet 3   Misc. Devices (GOJJI WEIGHT SCALE) MISC 1 Device by Does not apply route as needed. 1 each 0   ondansetron  (ZOFRAN ) 4 MG tablet Take 1 tablet (4 mg total) by mouth every 8 (eight) hours as needed for nausea or vomiting. (Patient not taking: Reported on 08/26/2023) 42 tablet 2   oxyCODONE  (OXY IR/ROXICODONE ) 5 MG immediate release tablet Take 5 mg by mouth every 4 (four) hours as needed.     Prenatal MV & Min w/FA-DHA ([employer] PRENATAL GUMMY) 0.18-25 MG CHEW Chew 3 each by mouth daily. (Patient not taking: Reported on 08/26/2023) 90 tablet 11   promethazine  (PHENERGAN ) 25 MG tablet Take 1 tablet (25 mg total) by mouth every 6 (six) hours as needed for nausea or vomiting. 30 tablet 2   QUEtiapine  (SEROQUEL ) 200 MG tablet Take 1 tablet (200 mg total) by mouth at bedtime. 30 tablet 1   scopolamine  (TRANSDERM-SCOP) 1 MG/3DAYS Place 1 patch  (1.5 mg total) onto the skin every 3 (three) days. (Patient not taking: Reported on 08/26/2023) 10 patch 12   No Known Allergies  ROS:  Pertinent positives/negatives listed above.  I have reviewed patient's Past Medical Hx, Surgical Hx, Family Hx, Social Hx, medications and allergies.   Physical Exam  Patient Vitals for the past 24 hrs:  BP Temp Temp src Pulse Resp SpO2  08/31/23 1754 127/81 -- -- 91 -- --  08/31/23 1731 (!) 126/58 -- -- (!) 156 -- --  08/31/23 1716 (!) 138/109 -- -- (!) 103 -- --  08/31/23 1701 (!) 131/96 -- -- 98 -- --  08/31/23 1646 (!) 141/86 -- -- 99 -- --  08/31/23 1631 (!) 136/91 -- -- (!) 101 -- --  08/31/23 1606 (!) 137/97 99.5 F (37.5 C) Oral 97 18 100 %   Constitutional: Well-developed, well-nourished female in no acute distress. Appears uncomfortable  Cardiovascular: normal rate Respiratory: normal effort GI: Abd soft, mild tenderness over incision and bladder. No drainage, erythema, warmth around incision. Healing well MS: Extremities nontender, no edema, normal ROM Neurologic: Alert and oriented x 4   LAB RESULTS Results for orders placed or performed during the hospital encounter of 08/31/23 (from the past 24 hours)  CBC     Status: Abnormal   Collection Time: 08/31/23  4:50 PM  Result Value Ref Range   WBC 6.4 4.0 - 10.5 K/uL   RBC 4.70 3.87 - 5.11 MIL/uL   Hemoglobin 9.1 (L) 12.0 - 15.0 g/dL   HCT 68.5 (L) 63.9 - 53.9 %   MCV 66.8 (L) 80.0 - 100.0 fL   MCH 19.4 (L) 26.0 - 34.0 pg  MCHC 29.0 (L) 30.0 - 36.0 g/dL   RDW 81.3 (H) 88.4 - 84.4 %   Platelets 393 150 - 400 K/uL   nRBC 0.0 0.0 - 0.2 %  Comprehensive metabolic panel     Status: Abnormal   Collection Time: 08/31/23  4:50 PM  Result Value Ref Range   Sodium 135 135 - 145 mmol/L   Potassium 3.6 3.5 - 5.1 mmol/L   Chloride 104 98 - 111 mmol/L   CO2 22 22 - 32 mmol/L   Glucose, Bld 92 70 - 99 mg/dL   BUN 10 6 - 20 mg/dL   Creatinine, Ser 9.25 0.44 - 1.00 mg/dL   Calcium  9.0 8.9  - 10.3 mg/dL   Total Protein 7.4 6.5 - 8.1 g/dL   Albumin 3.4 (L) 3.5 - 5.0 g/dL   AST 20 15 - 41 U/L   ALT 16 0 - 44 U/L   Alkaline Phosphatase 60 38 - 126 U/L   Total Bilirubin 0.4 0.0 - 1.2 mg/dL   GFR, Estimated >39 >39 mL/min   Anion gap 9 5 - 15    B/Negative/-- (04/08 1640)  IMAGING Echocardiogram Scanned Result Result Date: 08/25/2023 Ordered by an unspecified provider.  Echocardiogram Scanned Result Result Date: 08/25/2023 Ordered by an unspecified provider.   MAU Management/MDM: Orders Placed This Encounter  Procedures   CBC   Comprehensive metabolic panel   Discharge patient    Meds ordered this encounter  Medications   labetalol  (NORMODYNE ) tablet 300 mg   ibuprofen  (ADVIL ) tablet 800 mg   cyclobenzaprine  (FLEXERIL ) tablet 10 mg   labetalol  (NORMODYNE ) 300 MG tablet    Sig: Take 1 tablet (300 mg total) by mouth 2 (two) times daily.    Dispense:  180 tablet    Refill:  0   ibuprofen  (ADVIL ) 800 MG tablet    Sig: Take 1 tablet (800 mg total) by mouth 3 (three) times daily with meals as needed for headache or moderate pain (pain score 4-6).    Dispense:  60 tablet    Refill:  0   cyclobenzaprine  (FLEXERIL ) 10 MG tablet    Sig: Take 1 tablet (10 mg total) by mouth 3 (three) times daily as needed for muscle spasms.    Dispense:  30 tablet    Refill:  0   ferrous sulfate  325 (65 FE) MG tablet    Sig: Take 1 tablet (325 mg total) by mouth every other day.    Dispense:  45 tablet    Refill:  0     Available prenatal and delivery records reviewed.  Patient prevents POD#11 from pLTCS for fetal indications for high BP and HA. Known cHTN on labetalol . Do suspect she has an overlying component of postpartum hypertension as well. Headache is consistent with tension headache vs a preeclampsia headache. Additionally preeclampsia w/SF would be very unlikely this far out from delivery. However will obtain CBC, CMP for further evaluation. Will up-titrate her labetalol .  Given 300 mg here. Treat headache with ibuprofen  and flexeril .  1747: Patient reassessed. Headache resolved. Labs reassuring against PP preE and no severe range pressures. Will increase labetalol  to 300 mg BID (declines switching agents to Procardia). Has BP check in office tomorrow. Will additionally send in ibuprofen  and flexeril  for further tension headaches. Anemia may also be contributing to symptoms; will start PO iron  supplementation.  ASSESSMENT 1. Hx of Chronic hypertension during pregnancy   2. Tension headache   3. Anemia, postpartum   4. Chronic post-traumatic  stress disorder (PTSD)   5. Generalized anxiety disorder     PLAN Discharge home with strict return precautions. Allergies as of 08/31/2023   No Known Allergies      Medication List     STOP taking these medications    aspirin  EC 81 MG tablet   [facility] Prenatal Gummy 0.18-25 MG Chew   Ferric Maltol  30 MG Caps   metoCLOPramide  10 MG tablet Commonly known as: Reglan    ondansetron  4 MG tablet Commonly known as: Zofran    scopolamine  1 MG/3DAYS Commonly known as: TRANSDERM-SCOP       TAKE these medications    acetaminophen  500 MG tablet Commonly known as: TYLENOL  Take 1,000 mg by mouth every 6 (six) hours as needed.   Blood Pressure Kit Devi 1 each by Does not apply route as needed.   cyclobenzaprine  10 MG tablet Commonly known as: FLEXERIL  Take 1 tablet (10 mg total) by mouth 3 (three) times daily as needed for muscle spasms. What changed: when to take this   ferrous sulfate  325 (65 FE) MG tablet Take 1 tablet (325 mg total) by mouth every other day.   FLUoxetine  40 MG capsule Commonly known as: PROZAC  Take 1 capsule (40 mg total) by mouth daily.   Gojji Weight Scale Misc 1 Device by Does not apply route as needed.   ibuprofen  800 MG tablet Commonly known as: ADVIL  Take 1 tablet (800 mg total) by mouth 3 (three) times daily with meals as needed for headache or moderate pain (pain score  4-6).   labetalol  300 MG tablet Commonly known as: NORMODYNE  Take 1 tablet (300 mg total) by mouth 2 (two) times daily. What changed:  medication strength how much to take   oxyCODONE  5 MG immediate release tablet Commonly known as: Oxy IR/ROXICODONE  Take 5 mg by mouth every 4 (four) hours as needed.   promethazine  25 MG tablet Commonly known as: PHENERGAN  Take 1 tablet (25 mg total) by mouth every 6 (six) hours as needed for nausea or vomiting.   QUEtiapine  200 MG tablet Commonly known as: SEROQUEL  Take 1 tablet (200 mg total) by mouth at bedtime.         Almarie Moats, MD OB Fellow 08/31/2023  5:56 PM

## 2023-08-31 NOTE — MAU Note (Signed)
 Katrina Stewart is a 28 y.o. here in MAU PP C/S, delivery on 7/19. BPS have been 140s/90s, HA's since delivery that are worsening, denies visual changes and RUQ pain. Constant HA pain 8/10. Has attempted tylenol  at noon with no relief. Currently taking labetalol  100mg  BID.   Onset of complaint: since 7/19  Pain score: 8/10 Vitals:   08/31/23 1606  BP: (!) 137/97  Pulse: 97  Resp: 18  Temp: 99.5 F (37.5 C)  SpO2: 100%     Lab orders placed from triage: none ordered

## 2023-09-01 ENCOUNTER — Encounter: Payer: Self-pay | Admitting: Family Medicine

## 2023-09-02 ENCOUNTER — Other Ambulatory Visit: Payer: Self-pay

## 2023-09-02 ENCOUNTER — Ambulatory Visit: Admitting: Family Medicine

## 2023-09-02 VITALS — BP 127/84 | HR 93 | Ht 68.0 in | Wt 287.0 lb

## 2023-09-02 DIAGNOSIS — Z1332 Encounter for screening for maternal depression: Secondary | ICD-10-CM | POA: Diagnosis not present

## 2023-09-02 DIAGNOSIS — O10919 Unspecified pre-existing hypertension complicating pregnancy, unspecified trimester: Secondary | ICD-10-CM

## 2023-09-02 DIAGNOSIS — Z98891 History of uterine scar from previous surgery: Secondary | ICD-10-CM

## 2023-09-02 DIAGNOSIS — F4312 Post-traumatic stress disorder, chronic: Secondary | ICD-10-CM

## 2023-09-02 NOTE — Assessment & Plan Note (Signed)
 Normotensive, continue meds as prescribed.

## 2023-09-02 NOTE — Assessment & Plan Note (Signed)
 Extremely difficult circumstances around delivery and postpartum period. Emphasized that we are available at any time, also reviewed MAU/ED and [facility] as resources. No SI on screeners. Reports upcoming follow up with psych.

## 2023-09-02 NOTE — Progress Notes (Signed)
 GYNECOLOGY OFFICE VISIT NOTE  History:   Katrina Stewart is a 28 y.o. [phone] here today for follow up.  Patient had delivery on 08/20/2023 via pLTCS Was at [facility] for amniocentesis and intrauterine transfusion in setting of already known IUFD of twin A and suspected parvovirus infection with hydrops of fetus B During procedure fetus B developed non reassuring cardiac signs and was delivered by stat cesarean Infant subsequently passed away on DOL#3  Incision hurts a lot Oxycodone  makes the pain manageable, she is taking it PRN and ibuprofen /tylenol  round the clock Bleeding is less than a period Did have a large clot come out yesterday but has been fine since  Reports she is taking her BP meds as prescribed  Is not taking her psych meds as she does not feel they are helping Has a long standing relationship with [facility] psychiatrist Reports she has an appointment coming up       09/02/2023    9:35 AM 04/25/2019    9:39 AM 04/11/2019    2:48 PM 03/25/2019    8:49 AM 03/24/2019    6:30 AM  Edinburgh Postnatal Depression Scale Screening Tool  I have been able to laugh and see the funny side of things. 3 1 0 0 --  I have looked forward with enjoyment to things. 3 1 3 1    I have blamed myself unnecessarily when things went wrong. 3 1 3 2    I have been anxious or worried for no good reason. 3 0 2 2   I have felt scared or panicky for no good reason. 3 0 0 1   Things have been getting on top of me. 3 2 2 2    I have been so unhappy that I have had difficulty sleeping. 3 2 3  0   I have felt sad or miserable. 3 2 3 2    I have been so unhappy that I have been crying. 3 2 1 1    The thought of harming myself has occurred to me. 0 0 0 0   Edinburgh Postnatal Depression Scale Total 27 11  17  11        Data saved with a previous flowsheet row definition         09/02/2023    9:44 AM 05/10/2023    3:21 PM 12/21/2022    1:12 PM 12/07/2022    3:03 PM  GAD 7 : Generalized Anxiety Score   Nervous, Anxious, on Edge 3 2 0 3  Control/stop worrying 3 2 0 3  Worry too much - different things 3 2 0 3  Trouble relaxing 1 1 0 0  Restless 0 0 0 0  Easily annoyed or irritable 3 3 1 3   Afraid - awful might happen 3 1 0 1  Total GAD 7 Score 16 11 1 13   Anxiety Difficulty Very difficult  Somewhat difficult Somewhat difficult       09/02/2023    9:44 AM 05/10/2023    3:21 PM 02/08/2023    3:20 PM  PHQ9 SCORE ONLY  PHQ-9 Total Score 18 14 8      Health Maintenance Due  Topic Date Due   Pneumococcal Vaccine: 19-49 Years (1 of 2 - PCV) Never done   Hepatitis B Vaccines (1 of 3 - 19+ 3-dose series) Never done   HPV VACCINES (1 - 3-dose SCDM series) Never done   INFLUENZA VACCINE  09/02/2023    Past Medical History:  Diagnosis Date   Acute  pancreatitis 12/30/2022   Alcohol abuse 12/30/2022   Alcohol use disorder    Anxiety    Anxiety and depression 12/30/2022   Bipolar affective disorder, current episode mixed (HCC) 05/05/2021   Bipolar disorder (manic depression) (HCC) 02/12/2022   BV (bacterial vaginosis) 07/19/2022   Chronic hypertension during pregnancy 11/20/2018   [X]  Aspirin  81 mg daily after 12 weeks  Current antihypertensives:  None      Baseline and surveillance labs (pulled in from [facility], refresh links as needed)           Lab Results      Component    Value    Date           PLT    315    11/20/2018           CREATININE    0.62    11/20/2018           AST    38    11/20/2018           ALT    11    11/20/2018           PROTCRRATIO              10/19/202   Chronic post-traumatic stress disorder (PTSD) 10/04/2022   Class 3 severe obesity due to excess calories without serious comorbidity with body mass index (BMI) of 45.0 to 49.9 in adult 05/05/2021   COVID-19 virus infection 12/05/2018   + again on 2/4 (asymptomatic)  postiive test in MAU on 11-2   Depression    Depression    Phreesia 12/17/2019   Depression    Phreesia 12/30/2019   Essential hypertension     Gestational diabetes mellitus (GDM)    Normal postpartum 2 hr GTT   Group B streptococcal infection in pregnancy 03/13/2019   History of depression 08/29/2018   History of gestational diabetes 10/03/2019   Iron  deficiency anemia 01/18/2023   Prediabetes 05/05/2021   Psychophysiological insomnia 05/05/2021   Rh negative state in antepartum period 09/03/2015   [x]  needs rhogam at 28 weeks  [name] postive anti-D on initial lab. Patient received rhogam on 08/02/18 during MAU visit   Rh negative state in antepartum period 09/03/2015   [x]  needs rhogam at 28 weeks     [name] postive anti-D on initial lab. Patient received rhogam on 08/02/18 during MAU visit     Rubella non-immune status, antepartum 09/03/2015   Severe episode of recurrent major depressive disorder, without psychotic features (HCC)    Suspected sleep apnea 01/18/2023   UTI (urinary tract infection)    Vaginal Pap smear, abnormal    Vitamin D  deficiency 01/18/2023    Past Surgical History:  Procedure Laterality Date   BREAST SURGERY  2013   Lt & Rt excision juvenile fibroadenoma   MASS EXCISION  10/29/2011   Procedure: EXCISION MASS;  Surgeon: Vicenta DELENA Poli, MD;  Location: [facility];  Service: General;  Laterality: Bilateral;  excision of bilateral breast masses    The following portions of the patient's history were reviewed and updated as appropriate: allergies, current medications, past family history, past medical history, past social history, past surgical history and problem list.   Health Maintenance:   Last pap: Result Date Procedure Results Follow-ups  05/10/2023 Cytology - PAP( [place]) High risk HPV: Positive (A) Neisseria Gonorrhea: Negative Chlamydia: Negative Adequacy: Satisfactory for evaluation; transformation zone component ABSENT. Diagnosis: - Negative for intraepithelial lesion or malignancy (NILM) Comment: Normal Reference  Range HPV - Negative Comment: Normal Reference [place] Chlamydia -  Negative Comment: Normal Reference Range Neisseria Gonorrhea - Negative   12/31/2019 Cytology - PAP([place]) Adequacy: Satisfactory for evaluation; transformation zone component ABSENT. Diagnosis: - Negative for intraepithelial lesion or malignancy (NILM)   09/14/2018 Cytology - PAP( [place]) Adequacy: Satisfactory for evaluation  endocervical/transformation zone component PRESENT. Diagnosis: NEGATIVE FOR INTRAEPITHELIAL LESIONS OR MALIGNANCY. BENIGN REACTIVE/REPARATIVE CHANGES. Material Submitted: CervicoVaginal Pap [ThinPrep Imaged] CYTOLOGY - PAP: PAP RESULT   03/01/2017 [facility] PAP SMEAR Pap Smear: ASCUS     Last mammogram:  N/a    Review of Systems:  Pertinent items noted in HPI and remainder of comprehensive ROS otherwise negative.  Physical Exam:  BP 127/84   Pulse 93   Ht 5' 8 (1.727 m)   Wt 287 lb (130.2 kg)   Breastfeeding No   BMI 43.64 kg/m  CONSTITUTIONAL: Well-developed, well-nourished female in no acute distress.  HEENT:  Normocephalic, atraumatic. External right and left ear normal. No scleral icterus.  NECK: Normal range of motion, supple, no masses noted on observation SKIN: No rash noted. Not diaphoretic. No erythema. No pallor. MUSCULOSKELETAL: Normal range of motion. No edema noted. NEUROLOGIC: Alert and oriented to person, place, and time. Normal muscle tone coordination.  PSYCHIATRIC: Flat affect. Sad mood. RESPIRATORY: Effort normal, no problems with respiration noted  Labs and Imaging Results for orders placed or performed during the hospital encounter of 08/31/23 (from the past week)  CBC   Collection Time: 08/31/23  4:50 PM  Result Value Ref Range   WBC 6.4 4.0 - 10.5 K/uL   RBC 4.70 3.87 - 5.11 MIL/uL   Hemoglobin 9.1 (L) 12.0 - 15.0 g/dL   HCT 68.5 (L) 63.9 - 53.9 %   MCV 66.8 (L) 80.0 - 100.0 fL   MCH 19.4 (L) 26.0 - 34.0 pg   MCHC 29.0 (L) 30.0 - 36.0 g/dL   RDW 81.3 (H) 88.4 - 84.4 %   Platelets 393 150 - 400 K/uL   nRBC 0.0 0.0 - 0.2  %  Comprehensive metabolic panel   Collection Time: 08/31/23  4:50 PM  Result Value Ref Range   Sodium 135 135 - 145 mmol/L   Potassium 3.6 3.5 - 5.1 mmol/L   Chloride 104 98 - 111 mmol/L   CO2 22 22 - 32 mmol/L   Glucose, Bld 92 70 - 99 mg/dL   BUN 10 6 - 20 mg/dL   Creatinine, Ser 9.25 0.44 - 1.00 mg/dL   Calcium  9.0 8.9 - 10.3 mg/dL   Total Protein 7.4 6.5 - 8.1 g/dL   Albumin 3.4 (L) 3.5 - 5.0 g/dL   AST 20 15 - 41 U/L   ALT 16 0 - 44 U/L   Alkaline Phosphatase 60 38 - 126 U/L   Total Bilirubin 0.4 0.0 - 1.2 mg/dL   GFR, Estimated >39 >39 mL/min   Anion gap 9 5 - 15   Echocardiogram Scanned Result Result Date: 08/25/2023 Ordered by an unspecified provider.  Echocardiogram Scanned Result Result Date: 08/25/2023 Ordered by an unspecified provider.     Assessment and Plan:   Problem List Items Addressed This Visit       Cardiovascular and Mediastinum   Hx of Chronic hypertension during pregnancy - Primary   Normotensive, continue meds as prescribed.         Other   Chronic post-traumatic stress disorder (PTSD)   Extremely difficult circumstances around delivery and postpartum period. Emphasized that we are available  at any time, also reviewed MAU/ED and [facility] as resources. No SI on screeners. Reports upcoming follow up with psych.       S/P cesarean section   Incision looks good, no signs of infection, no drainage.        Total face-to-face time with patient: 15 minutes.  Over 50% of encounter was spent on counseling and coordination of care.   Donnice CHRISTELLA Carolus, MD/MPH Attending Family Medicine Physician, [facility] for [facility], [facility]

## 2023-09-02 NOTE — Assessment & Plan Note (Signed)
 Incision looks good, no signs of infection, no drainage.

## 2023-09-06 ENCOUNTER — Ambulatory Visit

## 2023-09-06 ENCOUNTER — Other Ambulatory Visit

## 2023-09-11 ENCOUNTER — Ambulatory Visit
Admission: EM | Admit: 2023-09-11 | Discharge: 2023-09-11 | Disposition: A | Discharge status: Home / Self Care | Attending: Internal Medicine | Admitting: Internal Medicine

## 2023-09-11 ENCOUNTER — Ambulatory Visit (INDEPENDENT_AMBULATORY_CARE_PROVIDER_SITE_OTHER)

## 2023-09-11 ENCOUNTER — Encounter: Payer: Self-pay | Admitting: Emergency Medicine

## 2023-09-11 DIAGNOSIS — M7031 Other bursitis of elbow, right elbow: Secondary | ICD-10-CM

## 2023-09-11 DIAGNOSIS — M25521 Pain in right elbow: Secondary | ICD-10-CM

## 2023-09-11 MED ORDER — PREDNISONE 20 MG PO TABS: 40.0000 mg | ORAL_TABLET | Freq: Every day | ORAL | 0 refills | Status: AC

## 2023-09-11 MED ORDER — DEXAMETHASONE SODIUM PHOSPHATE 10 MG/ML IJ SOLN
10.0000 mg | Freq: Once | INTRAMUSCULAR | Status: AC
  Administered 2023-09-11: 10 mg via INTRAMUSCULAR

## 2023-09-11 MED ORDER — KETOROLAC TROMETHAMINE 30 MG/ML IJ SOLN
30.0000 mg | Freq: Once | INTRAMUSCULAR | Status: AC
  Administered 2023-09-11: 30 mg via INTRAMUSCULAR

## 2023-09-11 NOTE — Discharge Instructions (Addendum)
 X-ray of the right elbow done today.  This shows soft tissue edema consistent with inflammation.  We can treat this with a strong anti-inflammatory, compression and ice.  We will treat with the following: Toradol  injection given today. This is a medication to help with pain. This is not a narcotic.  Decadron  injection given today. This is a steroid to help with inflammation and pain. Start 09/12/23 Prednisone  40 mg (2 tablets) once daily for 4 days. Take this in the morning.  This is a steroid to help with inflammation and pain. Ice the area 2-3 times daily for 10-15 minutes to help with pain and swelling. Do not apply ice directly to the skin.  May use compression wrap wrapped from the mid forearm to the mid upper arm.  You may wear this during the day but remove this at night.  Remove the compression wrap if there is any numbness or tingling into the fingers. Return to urgent care or PCP if symptoms worsen or fail to improve in the next 3 to 4 days.  Return if you develop fevers, increasing redness in the area, increased pain, increased swelling.

## 2023-09-11 NOTE — ED Triage Notes (Signed)
 Pt presents c/o right arm pain from mid forearm to mid upper arm x 2 days. Pt reports she has tried Tylenol  and Ibuprofen  to help relieve the sxs but it has not helped a lot.. Pt denies injury and/or fall.

## 2023-09-11 NOTE — ED Provider Notes (Addendum)
 [facility]    CSN: [id number] Arrival date & time: 09/11/23  1224      History   Chief Complaint Chief Complaint  Patient presents with   Arm Pain    HPI Katrina Stewart is a 28 y.o. female.   28 year old female presents urgent care with complaints of right medial elbow pain and swelling with radiating pain into the forearm.  This started about 2 days ago.  She does not recall having any injury to the area.  She does not do a lot of heavy lifting or playing any sports.  She denies any overuse of her arm at work.  She reports that it is especially tender along the medial aspect of the elbow where it is swollen.  She denies loss of grip strength in the hand.  She denies any fevers or chills.  She has not had any symptoms like this in the past.   Arm Pain Pertinent negatives include no chest pain, no abdominal pain and no shortness of breath.    Past Medical History:  Diagnosis Date   Acute pancreatitis 12/30/2022   Alcohol abuse 12/30/2022   Alcohol use disorder    Anxiety    Anxiety and depression 12/30/2022   Bipolar affective disorder, current episode mixed (HCC) 05/05/2021   Bipolar disorder (manic depression) (HCC) 02/12/2022   BV (bacterial vaginosis) 07/19/2022   Chronic hypertension during pregnancy 11/20/2018   [X]  Aspirin  81 mg daily after 12 weeks  Current antihypertensives:  None      Baseline and surveillance labs (pulled in from [facility], refresh links as needed)           Lab Results      Component    Value    Date           PLT    315    11/20/2018           CREATININE    0.62    11/20/2018           AST    38    11/20/2018           ALT    11    11/20/2018           PROTCRRATIO              10/19/202   Chronic post-traumatic stress disorder (PTSD) 10/04/2022   Class 3 severe obesity due to excess calories without serious comorbidity with body mass index (BMI) of 45.0 to 49.9 in adult 05/05/2021   COVID-19 virus infection 12/05/2018   + again on 2/4  (asymptomatic)  postiive test in [facility]U on 11-2   Depression    Depression    Phreesia 12/17/2019   Depression    Phreesia 12/30/2019   Essential hypertension    Gestational diabetes mellitus (GDM)    Normal postpartum 2 hr GTT   Group B streptococcal infection in pregnancy 03/13/2019   History of depression 08/29/2018   History of gestational diabetes 10/03/2019   Iron  deficiency anemia 01/18/2023   Prediabetes 05/05/2021   Psychophysiological insomnia 05/05/2021   Rh negative state in antepartum period 09/03/2015   [x]  needs rhogam at 28 weeks  [name] postive anti-D on initial lab. Patient received rhogam on 08/02/18 during MAU visit   Rh negative state in antepartum period 09/03/2015   [x]  needs rhogam at 28 weeks     [name] postive anti-D on initial lab. Patient received rhogam on 08/02/18 during MAU visit  Rubella non-immune status, antepartum 09/03/2015   Severe episode of recurrent major depressive disorder, without psychotic features (HCC)    Suspected sleep apnea 01/18/2023   UTI (urinary tract infection)    Vaginal Pap smear, abnormal    Vitamin D  deficiency 01/18/2023    Patient Active Problem List   Diagnosis Date Noted   S/P cesarean section 09/02/2023   Obesity affecting pregnancy, antepartum 08/15/2023   Fetal hydrops 07/26/2023   Low fetal fraction NIPS 07/26/2023   Marginal insertion of umbilical cord affecting management of mother in third trimester (twin A) 07/13/2023   Twin A anomalies: marginal umbilical cord insertion, single umbilical artery,EIF, possible bladder outlet problem 07/13/2023   Anemia of pregnancy in second trimester 06/07/2023   Dichorionic diamniotic twin pregnancy 06/06/2023   Generalized anxiety disorder 05/17/2023   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   Supervision of high-risk pregnancy 05/03/2023   Suspected sleep apnea 01/18/2023   Chronic post-traumatic stress disorder (PTSD) 10/04/2022   Bipolar affective disorder, current episode  mixed (HCC) 05/05/2021   Hx of Gestational diabetes mellitus (GDM), antepartum 01/08/2019   Hx of Chronic hypertension during pregnancy 11/20/2018   Rh negative state in antepartum period 09/03/2015    Past Surgical History:  Procedure Laterality Date   BREAST SURGERY  2013   Lt & Rt excision juvenile fibroadenoma   MASS EXCISION  10/29/2011   Procedure: EXCISION MASS;  Surgeon: Vicenta DELENA Poli, MD;  Location: [facility];  Service: General;  Laterality: Bilateral;  excision of bilateral breast masses    OB History     Gravida  4   Para  2   Term  2   Preterm      AB  1   Living  2      SAB  1   IAB  0   Ectopic      Multiple  0   Live Births  2            Home Medications    Prior to Admission medications   Medication Sig Start Date End Date Taking? Authorizing Provider  acetaminophen  (TYLENOL ) 500 MG tablet Take 1,000 mg by mouth every 6 (six) hours as needed.   Yes [provider]  predniSONE  (DELTASONE ) 20 MG tablet Take 2 tablets (40 mg total) by mouth daily with breakfast for 4 days. 09/11/23 09/15/23 Yes Racquel Arkin A, PA-C  Blood Pressure Monitoring (BLOOD PRESSURE KIT) DEVI 1 each by Does not apply route as needed. Patient not taking: Reported on 09/02/2023 05/03/23   Regino Camie DELENA, CNM  cyclobenzaprine  (FLEXERIL ) 10 MG tablet Take 1 tablet (10 mg total) by mouth 3 (three) times daily as needed for muscle spasms. 08/31/23   Nicholaus Almarie HERO, MD  ferrous sulfate  325 (65 FE) MG tablet Take 1 tablet (325 mg total) by mouth every other day. 08/31/23   Nicholaus Almarie HERO, MD  FLUoxetine  (PROZAC ) 40 MG capsule Take 1 capsule (40 mg total) by mouth daily. Patient not taking: Reported on 09/02/2023 07/21/23 09/19/23  Rainelle Pfeiffer, MD  ibuprofen  (ADVIL ) 800 MG tablet Take 1 tablet (800 mg total) by mouth 3 (three) times daily with meals as needed for headache or moderate pain (pain score 4-6). 08/31/23   Nicholaus Almarie HERO, MD  labetalol   (NORMODYNE ) 300 MG tablet Take 1 tablet (300 mg total) by mouth 2 (two) times daily. 08/31/23   Nicholaus Almarie HERO, MD  Misc. Devices (GOJJI WEIGHT SCALE) MISC 1 Device by Does not apply route  as needed. Patient not taking: Reported on 09/02/2023 05/03/23   Regino Camie LABOR, CNM  oxyCODONE  (OXY IR/ROXICODONE ) 5 MG immediate release tablet Take 5 mg by mouth every 4 (four) hours as needed. 08/24/23   [provider]  QUEtiapine  (SEROQUEL ) 200 MG tablet Take 1 tablet (200 mg total) by mouth at bedtime. Patient not taking: Reported on 09/02/2023 07/21/23   Rainelle Pfeiffer, MD    Family History Family History  Problem Relation Age of Onset   Diabetes Mother    Hypertension Mother    Other Father        doesn't know what he died from   Asthma Brother    Cancer Maternal Grandmother        lung cancer   Cancer Other        bladder cancer    Social History Social History   Tobacco Use   Smoking status: Never    Passive exposure: Never   Smokeless tobacco: Never  Vaping Use   Vaping status: Never Used  Substance Use Topics   Alcohol use: Not Currently    Alcohol/week: 2.0 - 3.0 standard drinks of alcohol    Types: 2 - 3 Shots of liquor per week    Comment: occasional   Drug use: No     Allergies   Patient has no known allergies.   Review of Systems Review of Systems  Constitutional:  Negative for chills and fever.  HENT:  Negative for ear pain and sore throat.   Eyes:  Negative for pain and visual disturbance.  Respiratory:  Negative for cough and shortness of breath.   Cardiovascular:  Negative for chest pain and palpitations.  Gastrointestinal:  Negative for abdominal pain and vomiting.  Genitourinary:  Negative for dysuria and hematuria.  Musculoskeletal:  Negative for arthralgias and back pain.       Right elbow and right forearm pain  Skin:  Negative for color change and rash.  Neurological:  Negative for seizures and syncope.  All other systems reviewed and  are negative.    Physical Exam Triage Vital Signs ED Triage Vitals  Encounter Vitals Group     BP 09/11/23 1312 123/80     Girls Systolic BP Percentile --      Girls Diastolic BP Percentile --      Boys Systolic BP Percentile --      Boys Diastolic BP Percentile --      Pulse Rate 09/11/23 1312 88     Resp 09/11/23 1312 18     Temp 09/11/23 1312 98.6 F (37 C)     Temp Source 09/11/23 1312 Oral     SpO2 09/11/23 1312 98 %     Weight 09/11/23 1311 287 lb 0.6 oz (130.2 kg)     Height --      Head Circumference --      Peak Flow --      Pain Score 09/11/23 1310 8     Pain Loc --      Pain Education --      Exclude from Growth Chart --    No data found.  Updated Vital Signs BP 123/80 (BP Location: Left Arm)   Pulse 88   Temp 98.6 F (37 C) (Oral)   Resp 18   Wt 287 lb 0.6 oz (130.2 kg)   LMP  (LMP Unknown)   SpO2 98%   Breastfeeding No   BMI 43.64 kg/m   Visual Acuity Right Eye Distance:  Left Eye Distance:   Bilateral Distance:    Right Eye Near:   Left Eye Near:    Bilateral Near:     Physical Exam Vitals and nursing note reviewed.  Constitutional:      General: She is not in acute distress.    Appearance: She is well-developed.  HENT:     Head: Normocephalic and atraumatic.  Eyes:     Conjunctiva/sclera: Conjunctivae normal.  Cardiovascular:     Rate and Rhythm: Normal rate and regular rhythm.     Heart sounds: No murmur heard. Pulmonary:     Effort: Pulmonary effort is normal. No respiratory distress.     Breath sounds: Normal breath sounds.  Abdominal:     Palpations: Abdomen is soft.  Musculoskeletal:        General: No swelling.     Right elbow: Swelling (Medially) present. No deformity. Decreased range of motion. Tenderness present in medial epicondyle.     Right hand: No swelling. Normal range of motion. Normal strength. Normal sensation. Normal capillary refill. Normal pulse.     Cervical back: Neck supple.     Comments: Medial elbow  with swelling present but no erythremia or fluctuance.  Skin:    General: Skin is warm and dry.     Capillary Refill: Capillary refill takes less than 2 seconds.  Neurological:     Mental Status: She is alert.  Psychiatric:        Mood and Affect: Mood normal.      UC Treatments / Results  Labs (all labs ordered are listed, but only abnormal results are displayed) Labs Reviewed - No data to display  EKG   Radiology DG Elbow Complete Right Result Date: 09/11/2023 CLINICAL DATA:  Pain and swelling. EXAM: RIGHT ELBOW - COMPLETE 3+ VIEW COMPARISON:  None Available. FINDINGS: Medial soft tissue edema.  No underlying osseous abnormality. IMPRESSION: Soft tissue edema without osseous abnormality. Electronically Signed   By: Newell Eke M.D.   On: 09/11/2023 14:31    Procedures Procedures (including critical care time)  Medications Ordered in UC Medications  ketorolac  (TORADOL ) 30 MG/ML injection 30 mg (has no administration in time range)  dexamethasone  (DECADRON ) injection 10 mg (has no administration in time range)    Initial Impression / Assessment and Plan / UC Course  I have reviewed the triage vital signs and the nursing notes.  Pertinent labs & imaging results that were available during my care of the patient were reviewed by me and considered in my medical decision making (see chart for details).     Right elbow pain - Plan: DG Elbow Complete Right, DG Elbow Complete Right  Bursitis of other bursa of right elbow   X-ray of the right elbow done today.  This shows soft tissue edema consistent with inflammation.  We can treat this with a strong anti-inflammatory, compression and ice.  We will treat with the following: Toradol  injection given today. This is a medication to help with pain. This is not a narcotic.  Decadron  injection given today. This is a steroid to help with inflammation and pain. Start 09/12/23 Prednisone  40 mg (2 tablets) once daily for 4 days. Take  this in the morning.  This is a steroid to help with inflammation and pain. Ice the area 2-3 times daily for 10-15 minutes to help with pain and swelling. Do not apply ice directly to the skin.  May use compression wrap wrapped from the mid forearm to the mid upper arm.  You may wear this during the day but remove this at night.  Remove the compression wrap if there is any numbness or tingling into the fingers. Return to urgent care or PCP if symptoms worsen or fail to improve in the next 3 to 4 days.  Return if you develop fevers, increasing redness in the area, increased pain, increased swelling.    Final Clinical Impressions(s) / UC Diagnoses   Final diagnoses:  Right elbow pain  Bursitis of other bursa of right elbow     Discharge Instructions      X-ray of the right elbow done today.  This shows soft tissue edema consistent with inflammation.  We can treat this with a strong anti-inflammatory, compression and ice.  We will treat with the following: Toradol  injection given today. This is a medication to help with pain. This is not a narcotic.  Decadron  injection given today. This is a steroid to help with inflammation and pain. Start 09/12/23 Prednisone  40 mg (2 tablets) once daily for 4 days. Take this in the morning.  This is a steroid to help with inflammation and pain. Ice the area 2-3 times daily for 10-15 minutes to help with pain and swelling. Do not apply ice directly to the skin.  May use compression wrap wrapped from the mid forearm to the mid upper arm.  You may wear this during the day but remove this at night.  Remove the compression wrap if there is any numbness or tingling into the fingers. Return to urgent care or PCP if symptoms worsen or fail to improve in the next 3 to 4 days.  Return if you develop fevers, increasing redness in the area, increased pain, increased swelling.       ED Prescriptions     Medication Sig Dispense Auth. Provider   predniSONE  (DELTASONE )  20 MG tablet Take 2 tablets (40 mg total) by mouth daily with breakfast for 4 days. 8 tablet Teresa Almarie LABOR, PA-C      PDMP not reviewed this encounter.   Teresa Almarie LABOR, PA-C 09/11/23 1444    Teresa Almarie LABOR, [place] 09/11/23 1445

## 2023-09-15 ENCOUNTER — Encounter (HOSPITAL_COMMUNITY): Payer: Self-pay | Admitting: Emergency Medicine

## 2023-09-15 ENCOUNTER — Emergency Department (HOSPITAL_COMMUNITY): Admission: EM | Admit: 2023-09-15 | Discharge: 2023-09-15 | Disposition: A | Discharge status: Home / Self Care

## 2023-09-15 ENCOUNTER — Ambulatory Visit (INDEPENDENT_AMBULATORY_CARE_PROVIDER_SITE_OTHER)

## 2023-09-15 ENCOUNTER — Encounter (HOSPITAL_COMMUNITY): Payer: Self-pay

## 2023-09-15 ENCOUNTER — Other Ambulatory Visit: Payer: Self-pay

## 2023-09-15 DIAGNOSIS — F3160 Bipolar disorder, current episode mixed, unspecified: Secondary | ICD-10-CM

## 2023-09-15 DIAGNOSIS — Z79899 Other long term (current) drug therapy: Secondary | ICD-10-CM | POA: Insufficient documentation

## 2023-09-15 DIAGNOSIS — M79601 Pain in right arm: Secondary | ICD-10-CM | POA: Diagnosis present

## 2023-09-15 DIAGNOSIS — F411 Generalized anxiety disorder: Secondary | ICD-10-CM

## 2023-09-15 DIAGNOSIS — I10 Essential (primary) hypertension: Secondary | ICD-10-CM | POA: Diagnosis not present

## 2023-09-15 DIAGNOSIS — F1021 Alcohol dependence, in remission: Secondary | ICD-10-CM

## 2023-09-15 DIAGNOSIS — F4312 Post-traumatic stress disorder, chronic: Secondary | ICD-10-CM

## 2023-09-15 MED ORDER — ENOXAPARIN SODIUM 150 MG/ML IJ SOSY
130.0000 mg | PREFILLED_SYRINGE | Freq: Once | INTRAMUSCULAR | Status: AC
  Administered 2023-09-15: 130 mg via SUBCUTANEOUS
  Filled 2023-09-15: qty 0.86

## 2023-09-15 NOTE — Discharge Instructions (Signed)
 Please follow instruction provided for outpatient evaluation for potential blood clot causing pain and discomfort of your right arm.

## 2023-09-15 NOTE — Progress Notes (Addendum)
 THERAPIST PROGRESS NOTE  Session Time: 2:01 pm to 3:00 pm  Virtual Visit via Video Note   I connected with [name] Mayo  at 2:01 pm  EST by a video enabled telemedicine application and verified that I am speaking with the correct person using two identifiers.   Location: Patient: home Provider: [address] [place]   I discussed the limitations of evaluation and management by telemedicine and the availability of in person appointments. The patient expressed understanding and agreed to proceed.   Type of Therapy: Individual   Therapist Response/Interventions: Supportive listening/Grief therapy/ access for alcohol use and address if needed    Therapy Goals Addressed: Problem: Substance Use  Dates: Start:  09/15/23    Disciplines: Interdisciplinary, PROVIDER  Goal: [name] will abstain from alcohol use per her report and if she comes in person, also by breathalyzer  if indicated.  Dates: Start:  09/15/23   Expected End:  03/17/24    Disciplines: Interdisciplinary, PROVIDER  Goal: [name] will decrease her anxiety and depression by reporting them no higher than 4 on the GAD-7 and PHQ-9.  Dates: Expected End:  03/17/24    Disciplines: Interdisciplinary, PROVIDER  Intervention: Therapist will educate [name] about SUD's patterns and consequences of use, relapse risks, the treatment process and types of mutual support group and provide early recovery coping and relapse prevention skills  Dates: Start:  09/15/23    Intervention: Therapist will assists [name] in identifying and changing thought and behaviors that led to mood symptoms and anxiety symptoms.  Dates: Start:  09/15/23    Description: Fronie gives this therapist verbal permission to electronically sign her Care Plan    Summary: Fronie presents today for virtual therapy.  Therapist saw in the notes where Fronie lost her two twins, one in utero and one at 47 days of age.  [name] says she has been staying in bed and is having a difficult  time functioning. She says she went to the NICU when she after she had an emergency C-section with the other the twin boy and witnessed the staff trying to revive her infant son.  She says she did not sleep that night.   Fronie says her infant son passed away in her arms.  She says when the staff called the death, it was difficult to fathom she had also lost this twin.  Fronie says she stayed with her mother after hospitalization and just came home.  She says her mother is being a big help taking care of the other children.  [name] says the MD's told her that at some point she had contracted the parva virus. She reports the MD said it was airborne and perhaps her younger son had it when he was sick, but his MD did not diagnosis it.  [name] says her MD said Kenna affects pregnant women differently.    [name] says she has not drank any alcohol.  She says it is difficult to make herself eat as she gets nauseated when she tries to eat.  Therapist encouraged her to try to eat and to stay hydrated.  [name] says it depressed her when she called to tell another provider she had lost the twins before she went to the appointment and then they asked her how her 3rd trimester was going.  Therapist explains her goals have expired and we need to update her treatment plan. She says she wants them to remain the same and gives this therapist permission to electronically sign her Care Plan.  [name] had some difficulty with the video connection today and had to sign in and out several times.  Progress Towards Goals: Limited progress  Suicidal/Homicidal: denies  Plan: Return again virtually on 09-29-23 at 3pm.  Diagnosis: Post Traumatic Stress Disorder Generalized Anxiety Disorder Alcohol Use Disorder, Severe, Dependence Bipolar Affective Disorder  Collaboration of Care: N/A  Patient/Guardian was advised Release of Information must be obtained prior to any record release in order to collaborate their care with an outside  provider. Patient/Guardian was advised if they have not already done so to contact the registration department to sign all necessary forms in order for us  to release information regarding their care.   Consent: Patient/Guardian gives verbal consent for treatment and assignment of benefits for services provided during this visit. Patient/Guardian expressed understanding and agreed to proceed.   [name], MS LMFT, LCAS 09-15-23

## 2023-09-15 NOTE — ED Provider Notes (Signed)
  [place] [facility] AT [facility] Provider Note   CSN: [id number] Arrival date & time: 09/15/23  8144     Patient presents with: No chief complaint on file.   Katrina Stewart is a 28 y.o. female.   The history is provided by the patient and medical records. No language interpreter was used.     28 year old female history of bipolar, depression, substance use disorder, hypertension presenting with complaint of arm pain.  Patient endorsed atraumatic right arm pain ongoing for the past several days.  Pain is described as an achy throbbing sensation worse with palpation.  Pain not improved despite taking Tylenol  and ibuprofen  at home.  She initially went to urgent care center for her complaint but was recommended to come to ER for further assessment.  She does not endorse any chest pain shortness of breath no neck pain no numbness or weakness.  No prior history of PE or DVT no recent surgery or prolonged bedrest no active cancer.  She was recently pregnant.  She is left-hand dominant  Prior to Admission medications   Medication Sig Start Date End Date Taking? Authorizing Provider  acetaminophen  (TYLENOL ) 500 MG tablet Take 1,000 mg by mouth every 6 (six) hours as needed.    [provider]  Blood Pressure Monitoring (BLOOD PRESSURE KIT) DEVI 1 each by Does not apply route as needed. Patient not taking: Reported on 09/02/2023 05/03/23   Regino Camie LABOR, CNM  cyclobenzaprine  (FLEXERIL ) 10 MG tablet Take 1 tablet (10 mg total) by mouth 3 (three) times daily as needed for muscle spasms. 08/31/23   Nicholaus Almarie HERO, MD  ferrous sulfate  325 (65 FE) MG tablet Take 1 tablet (325 mg total) by mouth every other day. 08/31/23   Nicholaus Almarie HERO, MD  FLUoxetine  (PROZAC ) 40 MG capsule Take 1 capsule (40 mg total) by mouth daily. Patient not taking: Reported on 09/02/2023 07/21/23 09/19/23  Rainelle Pfeiffer, MD  ibuprofen  (ADVIL ) 800 MG tablet Take 1 tablet (800 mg total)  by mouth 3 (three) times daily with meals as needed for headache or moderate pain (pain score 4-6). 08/31/23   Nicholaus Almarie HERO, MD  labetalol  (NORMODYNE ) 300 MG tablet Take 1 tablet (300 mg total) by mouth 2 (two) times daily. 08/31/23   Nicholaus Almarie HERO, MD  Misc. Devices (GOJJI WEIGHT SCALE) MISC 1 Device by Does not apply route as needed. Patient not taking: Reported on 09/02/2023 05/03/23   Regino Camie LABOR, CNM  oxyCODONE  (OXY IR/ROXICODONE ) 5 MG immediate release tablet Take 5 mg by mouth every 4 (four) hours as needed. 08/24/23   [provider]  predniSONE  (DELTASONE ) 20 MG tablet Take 2 tablets (40 mg total) by mouth daily with breakfast for 4 days. 09/11/23 09/15/23  Teresa Almarie LABOR, PA-C  QUEtiapine  (SEROQUEL ) 200 MG tablet Take 1 tablet (200 mg total) by mouth at bedtime. Patient not taking: Reported on 09/02/2023 07/21/23   Rainelle Pfeiffer, MD    Allergies: Patient has no known allergies.    Review of Systems  Constitutional:  Negative for fever.  Skin:  Negative for wound.    Updated Vital Signs BP 137/86 (BP Location: Left Arm)   Pulse 96   Temp 99 F (37.2 C) (Oral)   Resp 18   LMP  (LMP Unknown)   SpO2 98%   Physical Exam Vitals and nursing note reviewed.  Constitutional:      General: She is not in acute distress.    Appearance: She  is well-developed. She is obese.  HENT:     Head: Atraumatic.  Eyes:     Conjunctiva/sclera: Conjunctivae normal.  Pulmonary:     Effort: Pulmonary effort is normal.  Musculoskeletal:        General: Tenderness (Right arm: Tenderness noted along the ulnar aspect of the arm without any erythema edema or warmth and no deformity noted.  Radial pulse 2+.  Normal grip strength.) present.     Cervical back: Neck supple.  Skin:    Findings: No rash.  Neurological:     Mental Status: She is alert.  Psychiatric:        Mood and Affect: Mood normal.     (all labs ordered are listed, but only abnormal results are  displayed) Labs Reviewed - No data to display  EKG: None  Radiology: No results found.   Procedures   Medications Ordered in the ED - No data to display                                  Medical Decision Making  BP 137/86 (BP Location: Left Arm)   Pulse 96   Temp 99 F (37.2 C) (Oral)   Resp 18   LMP  (LMP Unknown)   SpO2 98%   29:12 PM  28 year old female history of bipolar, depression, substance use disorder, hypertension presenting with complaint of arm pain.  Patient endorsed atraumatic right arm pain ongoing for the past several days.  Pain is described as an achy throbbing sensation worse with palpation.  Pain not improved despite taking Tylenol  and ibuprofen  at home.  She initially went to urgent care center for her complaint but was recommended to come to ER for further assessment.  She does not endorse any chest pain shortness of breath no neck pain no numbness or weakness.  No prior history of PE or DVT no recent surgery or prolonged bedrest no active cancer.  She was recently pregnant.  Exam notable for tenderness along the ulnar aspect of the right arm without any signs of infection.  Due to no recent trauma I have low suspicion for fracture or dislocation.  She has good radial pulse and her strength and sensations are normal I have low suspicion for ischemia or stroke.  She will benefit from a vascular ultrasound to rule out DVT but unfortunately our radiologist tech is not available at the moment.  Will give Lovenox  and have patient return tomorrow for vascular ultrasound.  If negative, patient should continue with supportive care.  Patient voiced understanding and agrees with plan.     Final diagnoses:  Right arm pain    ED Discharge Orders          Ordered    UE Venous Duplex       Comments: IMPORTANT PATIENT INSTRUCTIONS: You have been scheduled for an Outpatient Vascular Study at [facility].  If tomorrow is a Saturday, Sunday or holiday, please go to the  [facility] Registration Desk at 11 am tomorrow morning and tell them you are there for a vascular study.  If tomorrow is a weekday (Monday-Friday), please go to the [facility] (address [address] [place]) at 8 am and report to the 4th floor registration Zone A.  Inform registration that you are there for a vascular study.   09/15/23 2011  Nivia Colon, PA-C 09/15/23 2013    Kammerer, Megan L, DO 09/16/23 1540

## 2023-09-15 NOTE — ED Triage Notes (Incomplete)
 Patient c/o right arm pain x 1 week. Patient report taking OTC pain medication without relief. No history of injury.

## 2023-09-16 ENCOUNTER — Other Ambulatory Visit (HOSPITAL_COMMUNITY): Payer: Self-pay

## 2023-09-16 ENCOUNTER — Ambulatory Visit (HOSPITAL_COMMUNITY)
Admission: RE | Admit: 2023-09-16 | Discharge: 2023-09-16 | Disposition: A | Discharge status: Home / Self Care | Source: Ambulatory Visit | Attending: Emergency Medicine | Admitting: Emergency Medicine

## 2023-09-16 ENCOUNTER — Telehealth: Payer: Self-pay | Admitting: Family Medicine

## 2023-09-16 ENCOUNTER — Ambulatory Visit (INDEPENDENT_AMBULATORY_CARE_PROVIDER_SITE_OTHER): Admitting: Pharmacist

## 2023-09-16 VITALS — BP 138/100 | HR 74 | Wt 290.2 lb

## 2023-09-16 DIAGNOSIS — I82890 Acute embolism and thrombosis of other specified veins: Secondary | ICD-10-CM

## 2023-09-16 DIAGNOSIS — M79601 Pain in right arm: Secondary | ICD-10-CM

## 2023-09-16 MED ORDER — APIXABAN 5 MG PO TABS: 5.0000 mg | ORAL_TABLET | Freq: Two times a day (BID) | ORAL | 0 refills | Status: DC

## 2023-09-16 MED ORDER — ELIQUIS DVT/PE STARTER PACK 5 MG PO TBPK
ORAL_TABLET | ORAL | 0 refills | Status: DC
  Filled 2023-09-16 (×2): qty 74, 30d supply, fill #0

## 2023-09-16 NOTE — Telephone Encounter (Signed)
 Patient called to cancel her postpartum appointment she said she is going go back to [facility] where she gave birth.

## 2023-09-16 NOTE — Patient Instructions (Addendum)
-  Start apixaban  (Eliquis ) 10 mg twice daily for 7 days followed by 5 mg twice daily. -Your refills have been sent to . You may need to call the pharmacy to ask them to fill this when you start to run low on your current supply.  -It is important to take your medication around the same time every day.  -Avoid NSAIDs like ibuprofen  (Advil , Motrin ) and naproxen  (Aleve ) as well as aspirin  doses over 100 mg daily. -Tylenol  (acetaminophen ) is the preferred over the counter pain medication to lower the risk of bleeding. -Be sure to alert all of your health care providers that you are taking an anticoagulant prior to starting a new medication or having a procedure. -Monitor for signs and symptoms of bleeding (abnormal bruising, prolonged bleeding, nose bleeds, bleeding from gums, discolored urine, black tarry stools). If you have fallen and hit your head OR if your bleeding is severe or not stopping, seek emergency care.  -Go to the emergency room if emergent signs and symptoms of new clot occur (new or worse swelling and pain in an arm or leg, shortness of breath, chest pain, fast or irregular heartbeats, lightheadedness, dizziness, fainting, coughing up blood) or if you experience a significant color change (pale or blue) in the extremity that has the DVT.   If you have any questions or need to reschedule an appointment, please call [phone]. If you are having an emergency, call 911 or present to the nearest emergency room.

## 2023-09-16 NOTE — Progress Notes (Signed)
 DVT Clinic Note  Name: Katrina Stewart     MRN: [id number]     DOB: 1995-09-23     Sex: female  PCP: Tanda Bleacher, MD  Today's Visit: Visit Information: Initial Visit  Referred to DVT Clinic by: Primary Care - Dr. Bleacher Tanda Referred to CPP by: Dr. Sheree Reason for referral:  Chief Complaint  Patient presents with   superficial vein thrombosis   HISTORY OF PRESENT ILLNESS: Katrina Stewart is a 28 y.o. female with PMH HTN, GDM, PTSD, bipolar disorder, thalassemia alpha carrier, anemia, and obesity who presents after diagnosis of superficial vein thrombosis in right upper extremity for medication management. She has a history of superficial vein thrombosis in the left greater saphenous vein in April during pregnancy for which she was treated with warm compresses and Tylenol  as needed for pain and symptoms resolved. Patient is currently ~4 weeks post-partum. Underwent emergency c-section on 08/20/23 and was discharged on 08/24/23. Pregnancy was complicated by fetal parvovirus and her son passed away in the NICU after birth. Reports pain and swelling in her right arm began about a week ago. She has been using a compression wrap which helps with the swelling, but says her arm is very painful. States when she was hospitalized she had an IV in her right arm. Prior to pregnancy, she denies history of blood clots.  Positive Thrombotic Risk Factors: Within 6 weeks postpartum, Recent cesarean section (within 3 months), Obesity, Other (comment) (IV in right arm) Bleeding Risk Factors: Anemia  Negative Thrombotic Risk Factors: Recent surgery (within 3 months), Recent trauma (within 3 months), Recent admission to hospital with acute illness (within 3 months), Paralysis, paresis, or recent plaster cast immobilization of lower extremity, Sedentary journey lasting >8 hours within 4 weeks, Bed rest >72 hours within 3 months, Estrogen therapy, Testosterone therapy, Erythropoiesis-stimulating agent, Recent  COVID diagnosis (within 3 months), Known thrombophilic condition, Smoking, Non-malignant, chronic inflammatory condition, Active cancer, Older age  Rx Insurance Coverage: Medicaid Rx Affordability: Eliquis  is $4 per 1 month supply Rx Assistance Provided: N/A Preferred Pharmacy: Starter pack filled at [facility] during appointment. Remainder of treatment sent to [facility] per patient preference.  Past Medical History:  Diagnosis Date   Acute pancreatitis 12/30/2022   Alcohol abuse 12/30/2022   Alcohol use disorder    Anxiety    Anxiety and depression 12/30/2022   Bipolar affective disorder, current episode mixed (HCC) 05/05/2021   Bipolar disorder (manic depression) (HCC) 02/12/2022   BV (bacterial vaginosis) 07/19/2022   Chronic hypertension during pregnancy 11/20/2018   [X]  Aspirin  81 mg daily after 12 weeks  Current antihypertensives:  None      Baseline and surveillance labs (pulled in from [facility], refresh links as needed)           Lab Results      Component    Value    Date           PLT    315    11/20/2018           CREATININE    0.62    11/20/2018           AST    38    11/20/2018           ALT    11    11/20/2018           PROTCRRATIO              10/19/202   Chronic  post-traumatic stress disorder (PTSD) 10/04/2022   Class 3 severe obesity due to excess calories without serious comorbidity with body mass index (BMI) of 45.0 to 49.9 in adult 05/05/2021   COVID-19 virus infection 12/05/2018   + again on 2/4 (asymptomatic)  postiive test in MAU on 11-2   Depression    Depression    Phreesia 12/17/2019   Depression    Phreesia 12/30/2019   Essential hypertension    Gestational diabetes mellitus (GDM)    Normal postpartum 2 hr GTT   Group B streptococcal infection in pregnancy 03/13/2019   History of depression 08/29/2018   History of gestational diabetes 10/03/2019   Iron  deficiency anemia 01/18/2023   Prediabetes 05/05/2021   Psychophysiological insomnia  05/05/2021   Rh negative state in antepartum period 09/03/2015   [x]  needs rhogam at 28 weeks  [name] postive anti-D on initial lab. Patient received rhogam on 08/02/18 during MAU visit   Rh negative state in antepartum period 09/03/2015   [x]  needs rhogam at 28 weeks     [name] postive anti-D on initial lab. Patient received rhogam on 08/02/18 during MAU visit     Rubella non-immune status, antepartum 09/03/2015   Severe episode of recurrent major depressive disorder, without psychotic features (HCC)    Suspected sleep apnea 01/18/2023   UTI (urinary tract infection)    Vaginal Pap smear, abnormal    Vitamin D  deficiency 01/18/2023    Past Surgical History:  Procedure Laterality Date   BREAST SURGERY  2013   Lt & Rt excision juvenile fibroadenoma   MASS EXCISION  10/29/2011   Procedure: EXCISION MASS;  Surgeon: Vicenta DELENA Poli, MD;  Location: [facility];  Service: General;  Laterality: Bilateral;  excision of bilateral breast masses    Social History   Socioeconomic History   Marital status: Single    Spouse name: Not on file   Number of children: Not on file   Years of education: Not on file   Highest education level: Some college, no degree  Occupational History    Comment: unemployed  Tobacco Use   Smoking status: Never    Passive exposure: Never   Smokeless tobacco: Never  Vaping Use   Vaping status: Never Used  Substance and Sexual Activity   Alcohol use: Not Currently    Alcohol/week: 2.0 - 3.0 standard drinks of alcohol    Types: 2 - 3 Shots of liquor per week    Comment: occasional   Drug use: No   Sexual activity: Not Currently    Birth control/protection: None  Other Topics Concern   Not on file  Social History Narrative   Not on file   Social Drivers of Health   Financial Resource Strain: Low Risk  (02/08/2023)   Overall Financial Resource Strain (CARDIA)    Difficulty of Paying Living Expenses: Not hard at all  Food Insecurity: No Food Insecurity  (09/02/2023)   Hunger Vital Sign    Worried About Running Out of Food in the Last Year: Never true    Ran Out of Food in the Last Year: Never true  Transportation Needs: No Transportation Needs (09/02/2023)   PRAPARE - Administrator, Civil Service (Medical): No    Lack of Transportation (Non-Medical): No  Physical Activity: Insufficiently Active (02/08/2023)   Exercise Vital Sign    Days of Exercise per Week: 5 days    Minutes of Exercise per Session: 20 min  Stress: Stress Concern Present (02/08/2023)   [place]  [employer] - Occupational Stress Questionnaire    Feeling of Stress : Very much  Social Connections: Moderately Isolated (02/08/2023)   Social Connection and Isolation Panel    Frequency of Communication with Friends and Family: More than three times a week    Frequency of Social Gatherings with Friends and Family: More than three times a week    Attends Religious Services: 1 to 4 times per year    Active Member of [employer] or Organizations: No    Attends Engineer, structural: Not on file    Marital Status: Never married  Intimate Partner Violence: Not At Risk (08/31/2023)   Humiliation, Afraid, Rape, and Kick questionnaire    Fear of Current or Ex-Partner: No    Emotionally Abused: No    Physically Abused: No    Sexually Abused: No    Family History  Problem Relation Age of Onset   Diabetes Mother    Hypertension Mother    Other Father        doesn't know what he died from   Asthma Brother    Cancer Maternal Grandmother        lung cancer   Cancer Other        bladder cancer    Allergies as of 09/16/2023   (No Known Allergies)    Current Outpatient Medications on File Prior to Visit  Medication Sig Dispense Refill   acetaminophen  (TYLENOL ) 500 MG tablet Take 1,000 mg by mouth every 6 (six) hours as needed.     ibuprofen  (ADVIL ) 800 MG tablet Take 1 tablet (800 mg total) by mouth 3 (three) times daily with meals as needed for  headache or moderate pain (pain score 4-6). 60 tablet 0   labetalol  (NORMODYNE ) 300 MG tablet Take 1 tablet (300 mg total) by mouth 2 (two) times daily. 180 tablet 0   Blood Pressure Monitoring (BLOOD PRESSURE KIT) DEVI 1 each by Does not apply route as needed. (Patient not taking: Reported on 09/02/2023) 1 each 0   Misc. Devices (GOJJI WEIGHT SCALE) MISC 1 Device by Does not apply route as needed. (Patient not taking: Reported on 09/16/2023) 1 each 0   No current facility-administered medications on file prior to visit.   REVIEW OF SYSTEMS:  Review of Systems  Respiratory:  Negative for shortness of breath.   Cardiovascular:  Negative for chest pain.  Musculoskeletal:  Positive for myalgias.   PHYSICAL EXAMINATION:  Vitals:   09/16/23 1001  BP: (!) 138/100  Pulse: 74  SpO2: 100%  Weight: 290 lb 3.2 oz (131.6 kg)    Body mass index is 44.12 kg/m.  Physical Exam Vitals reviewed.  Cardiovascular:     Rate and Rhythm: Normal rate.  Pulmonary:     Effort: Pulmonary effort is normal.  Musculoskeletal:        General: Swelling (right upper arm) and tenderness present.  Neurological:     Mental Status: She is alert.  Psychiatric:        Mood and Affect: Mood normal.    LABS:  CBC     Component Value Date/Time   WBC 6.4 08/31/2023 1650   RBC 4.70 08/31/2023 1650   HGB 9.1 (L) 08/31/2023 1650   HGB 10.6 (L) 06/06/2023 1636   HCT 31.4 (L) 08/31/2023 1650   HCT 34.4 06/06/2023 1636   PLT 393 08/31/2023 1650   PLT 320 06/06/2023 1636   MCV 66.8 (L) 08/31/2023 1650   MCV 71 (L) 06/06/2023 1636  MCH 19.4 (L) 08/31/2023 1650   MCHC 29.0 (L) 08/31/2023 1650   RDW 18.6 (H) 08/31/2023 1650   RDW 16.3 (H) 06/06/2023 1636   LYMPHSABS 1.8 05/10/2023 1640   MONOABS 0.8 03/24/2019 0501   EOSABS 0.2 05/10/2023 1640   BASOSABS 0.0 05/10/2023 1640    Hepatic Function      Component Value Date/Time   PROT 7.4 08/31/2023 1650   PROT 6.9 06/06/2023 1636   ALBUMIN 3.4 (L) 08/31/2023  1650   ALBUMIN 4.1 06/06/2023 1636   AST 20 08/31/2023 1650   ALT 16 08/31/2023 1650   ALKPHOS 60 08/31/2023 1650   BILITOT 0.4 08/31/2023 1650   BILITOT 0.3 06/06/2023 1636   BILIDIR <0.1 12/30/2022 0622   IBILI NOT CALCULATED 12/30/2022 0622    Renal Function   Lab Results  Component Value Date   CREATININE 0.74 08/31/2023   CREATININE 0.68 06/06/2023   CREATININE 0.77 05/10/2023    Estimated Creatinine Clearance: 147.7 mL/min (by C-G formula based on SCr of 0.74 mg/dL).   [facility] Vascular Lab Studies:  09/16/23 VAS US  UPPER EXTREMITY VENOUS DUPLEX: Summary:  Right:  No evidence of deep vein thrombosis in the upper extremity. Findings  consistent with age indeterminate superficial vein thrombosis involving the right  basilic vein. Occlusive in the proximal upper arm, close to the confluence with  the brachial vein; non occlusive in the distal forearm.   ASSESSMENT: Location of thrombus: right upper extremity Cause of thrombus: provoked by a transient risk factor  Patient without prior history of DVT diagnosed with superficial vein thrombosis involving the right basilic vein which is occlusive in the proximal upper arm close to the confluence with the brachial vein and non occlusive in the distal forearm. She has a history of superficial vein thrombosis in the left greater saphenous vein during pregnancy. Risk factors for thrombosis include recent peripheral IV while she was hospitalized. She is also within 6 weeks post-partum and cesarean section. Pregnancy was complicated by fetal parvovirus infection and her son passed away in the NICU after birth so she is not currently breastfeeding. Discussed the patient with Dr. Sheree who recommended to treat with anticoagulation for 45 days for superficial vein thrombosis. Will start on Eliquis  today. Chronic anemia on labs, platelets within normal limits. Counseled to keep follow up appointment with OB/GYN next week and let them know she has  started taking Eliquis . She is currently taking ibuprofen  and Tylenol  as needed for pain. Discussed preference for Tylenol  due to increased risk of bleeding with NSAIDs and Eliquis . Extensively counseled on Eliquis  and all patient questions were answered. No barriers to medication access or adherence identified today. Will follow up with her in 5-6 weeks and counseled her to reach out in the meantime if she has any additional questions or concerns.  PLAN: -Start apixaban  (Eliquis ) 10 mg twice daily for 7 days followed by 5 mg twice daily. -Expected duration of therapy: 45 days. Therapy started on 09/16/23. -Patient educated on purpose, proper use and potential adverse effects of apixaban  (Eliquis ). -Discussed importance of taking medication around the same time every day. -Advised patient of medications to avoid (NSAIDs, aspirin  doses >100 mg daily). -Educated that Tylenol  (acetaminophen ) is the preferred analgesic to lower the risk of bleeding. -Advised patient to alert all providers of anticoagulation therapy prior to starting a new medication or having a procedure. -Emphasized importance of monitoring for signs and symptoms of bleeding (abnormal bruising, prolonged bleeding, nose bleeds, bleeding from gums, discolored urine, black  tarry stools). -Educated patient to present to the ED if emergent signs and symptoms of new thrombosis occur.  Follow up: DVT Clinic in 5-6 weeks  Izetta Henry, PharmD [facility] Clinical Pharmacist
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
## 2023-09-20 ENCOUNTER — Ambulatory Visit: Admitting: Obstetrics and Gynecology

## 2023-09-22 ENCOUNTER — Encounter (HOSPITAL_COMMUNITY)
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
## 2023-09-29 ENCOUNTER — Telehealth (HOSPITAL_COMMUNITY): Payer: Self-pay

## 2023-09-29 ENCOUNTER — Ambulatory Visit (HOSPITAL_COMMUNITY)

## 2023-09-29 NOTE — Telephone Encounter (Signed)
 Katrina Stewart calls front desk to say she was having IT difficulties and could not connect virtually. She requested this therapist to call her which therapist did. She answered and therapist confirmed her identity by obtaining two verifiers. Katrina Stewart explained to this therapist she was having trouble connecting virtually to the session. She said she asked her brother to look at it and she is going to take it tomorrow to the stores where she purchased the phone.  She asked to reschedule and therapist offered 10-18-23 at 1pm.  She accepted.  [name], MS, LMFT, LCAS

## 2023-10-04 ENCOUNTER — Other Ambulatory Visit

## 2023-10-04 ENCOUNTER — Ambulatory Visit

## 2023-10-04 ENCOUNTER — Ambulatory Visit (INDEPENDENT_AMBULATORY_CARE_PROVIDER_SITE_OTHER): Admitting: Family Medicine

## 2023-10-04 ENCOUNTER — Encounter: Payer: Self-pay | Admitting: Family Medicine

## 2023-10-04 VITALS — BP 134/85 | HR 88 | Ht 68.0 in | Wt 289.6 lb

## 2023-10-04 DIAGNOSIS — Z6841 Body Mass Index (BMI) 40.0 and over, adult: Secondary | ICD-10-CM

## 2023-10-04 DIAGNOSIS — E66813 Obesity, class 3: Secondary | ICD-10-CM

## 2023-10-05 NOTE — Progress Notes (Unsigned)
 Established Patient Office Visit  Subjective    Patient ID: Katrina Stewart, female    DOB: 16-Nov-1995  Age: 28 y.o. MRN: [id number]  CC:  Chief Complaint  Patient presents with   Hospitalization Follow-up    Pt also wants Dr. To check c-section scar. States has funny smell. Pt is also interested in getting back on a weight loss medication     HPI Katrina Stewart presents for follow up of obesity. Patient was recently pregnant and lost a set of twins at 20+ weeks. Patient also wants to check her c-section scar.   Outpatient Encounter Medications as of 10/04/2023  Medication Sig   acetaminophen  (TYLENOL ) 500 MG tablet Take 1,000 mg by mouth every 6 (six) hours as needed.   apixaban  (ELIQUIS ) 5 MG TABS tablet Take 1 tablet (5 mg total) by mouth 2 (two) times daily. Start taking after completion of starter pack.   Apixaban  Starter Pack, 10mg  and 5mg , (ELIQUIS  DVT/PE STARTER PACK) Take as directed on package: start with two-5mg  tablets twice daily for 7 days. On day 8, switch to one-5mg  tablet twice daily.   Blood Pressure Monitoring (BLOOD PRESSURE KIT) DEVI 1 each by Does not apply route as needed. (Patient not taking: Reported on 09/02/2023)   ibuprofen  (ADVIL ) 800 MG tablet Take 1 tablet (800 mg total) by mouth 3 (three) times daily with meals as needed for headache or moderate pain (pain score 4-6). (Patient not taking: Reported on 10/04/2023)   labetalol  (NORMODYNE ) 300 MG tablet Take 1 tablet (300 mg total) by mouth 2 (two) times daily. (Patient not taking: Reported on 10/04/2023)   Misc. Devices (GOJJI WEIGHT SCALE) MISC 1 Device by Does not apply route as needed. (Patient not taking: Reported on 09/16/2023)   No facility-administered encounter medications on file as of 10/04/2023.    Past Medical History:  Diagnosis Date   Acute pancreatitis 12/30/2022   Alcohol abuse 12/30/2022   Alcohol use disorder    Anxiety    Anxiety and depression 12/30/2022   Bipolar affective disorder,  current episode mixed (HCC) 05/05/2021   Bipolar disorder (manic depression) (HCC) 02/12/2022   BV (bacterial vaginosis) 07/19/2022   Chronic hypertension during pregnancy 11/20/2018   [X]  Aspirin  81 mg daily after 12 weeks  Current antihypertensives:  None      Baseline and surveillance labs (pulled in from [facility], refresh links as needed)           Lab Results      Component    Value    Date           PLT    315    11/20/2018           CREATININE    0.62    11/20/2018           AST    38    11/20/2018           ALT    11    11/20/2018           PROTCRRATIO              10/19/202   Chronic post-traumatic stress disorder (PTSD) 10/04/2022   Class 3 severe obesity due to excess calories without serious comorbidity with body mass index (BMI) of 45.0 to 49.9 in adult 05/05/2021   COVID-19 virus infection 12/05/2018   + again on 2/4 (asymptomatic)  postiive test in MAU on 11-2   Depression    Depression  Phreesia 12/17/2019   Depression    Phreesia 12/30/2019   Essential hypertension    Gestational diabetes mellitus (GDM)    Normal postpartum 2 hr GTT   Group B streptococcal infection in pregnancy 03/13/2019   History of depression 08/29/2018   History of gestational diabetes 10/03/2019   Iron  deficiency anemia 01/18/2023   Prediabetes 05/05/2021   Psychophysiological insomnia 05/05/2021   Rh negative state in antepartum period 09/03/2015   [x]  needs rhogam at 28 weeks  [name] postive anti-D on initial lab. Patient received rhogam on 08/02/18 during MAU visit   Rh negative state in antepartum period 09/03/2015   [x]  needs rhogam at 28 weeks     [name] postive anti-D on initial lab. Patient received rhogam on 08/02/18 during MAU visit     Rubella non-immune status, antepartum 09/03/2015   Severe episode of recurrent major depressive disorder, without psychotic features (HCC)    Suspected sleep apnea 01/18/2023   UTI (urinary tract infection)    Vaginal Pap smear, abnormal    Vitamin D   deficiency 01/18/2023    Past Surgical History:  Procedure Laterality Date   BREAST SURGERY  2013   Lt & Rt excision juvenile fibroadenoma   MASS EXCISION  10/29/2011   Procedure: EXCISION MASS;  Surgeon: Vicenta DELENA Poli, MD;  Location: [facility];  Service: General;  Laterality: Bilateral;  excision of bilateral breast masses    Family History  Problem Relation Age of Onset   Diabetes Mother    Hypertension Mother    Other Father        doesn't know what he died from   Asthma Brother    Cancer Maternal Grandmother        lung cancer   Cancer Other        bladder cancer    Social History   Socioeconomic History   Marital status: Single    Spouse name: Not on file   Number of children: Not on file   Years of education: Not on file   Highest education level: Some college, no degree  Occupational History    Comment: unemployed  Tobacco Use   Smoking status: Never    Passive exposure: Never   Smokeless tobacco: Never  Vaping Use   Vaping status: Never Used  Substance and Sexual Activity   Alcohol use: Not Currently    Alcohol/week: 2.0 - 3.0 standard drinks of alcohol    Types: 2 - 3 Shots of liquor per week    Comment: occasional   Drug use: No   Sexual activity: Not Currently    Birth control/protection: None  Other Topics Concern   Not on file  Social History Narrative   Not on file   Social Drivers of Health   Financial Resource Strain: Low Risk  (02/08/2023)   Overall Financial Resource Strain (CARDIA)    Difficulty of Paying Living Expenses: Not hard at all  Food Insecurity: No Food Insecurity (09/02/2023)   Hunger Vital Sign    Worried About Running Out of Food in the Last Year: Never true    Ran Out of Food in the Last Year: Never true  Transportation Needs: No Transportation Needs (09/02/2023)   PRAPARE - Administrator, Civil Service (Medical): No    Lack of Transportation (Non-Medical): No  Physical Activity: Insufficiently Active  (02/08/2023)   Exercise Vital Sign    Days of Exercise per Week: 5 days    Minutes of Exercise per Session: 20  min  Stress: Stress Concern Present (02/08/2023)   [employer] of Occupational Health - Occupational Stress Questionnaire    Feeling of Stress : Very much  Social Connections: Moderately Isolated (02/08/2023)   Social Connection and Isolation Panel    Frequency of Communication with Friends and Family: More than three times a week    Frequency of Social Gatherings with Friends and Family: More than three times a week    Attends Religious Services: 1 to 4 times per year    Active Member of [employer] or Organizations: No    Attends Engineer, structural: Not on file    Marital Status: Never married  Intimate Partner Violence: Not At Risk (08/31/2023)   Humiliation, Afraid, Rape, and Kick questionnaire    Fear of Current or Ex-Partner: No    Emotionally Abused: No    Physically Abused: No    Sexually Abused: No    Review of Systems  All other systems reviewed and are negative.       Objective    BP 134/85   Pulse 88   Ht 5' 8 (1.727 m)   Wt 289 lb 9.6 oz (131.4 kg)   LMP  (LMP Unknown)   SpO2 98%   Breastfeeding No   BMI 44.03 kg/m   Physical Exam Vitals and nursing note reviewed.  Constitutional:      General: She is not in acute distress.    Appearance: She is obese.  Cardiovascular:     Rate and Rhythm: Normal rate and regular rhythm.  Pulmonary:     Effort: Pulmonary effort is normal.     Breath sounds: Normal breath sounds.  Abdominal:     Palpations: Abdomen is soft.     Tenderness: There is no abdominal tenderness.  Skin:    Comments: Well healing surgical scar noted.   Neurological:     General: No focal deficit present.     Mental Status: She is alert and oriented to person, place, and time.         Assessment & Plan:   Class 3 severe obesity due to excess calories with serious comorbidity and body mass index (BMI) of 40.0 to 44.9  in adult   Will defer starting any agent for weight loss at this time until cleared post partum. Patient vlul  No follow-ups on file.   Tanda Raguel SQUIBB, MD

## 2023-10-06 ENCOUNTER — Ambulatory Visit (INDEPENDENT_AMBULATORY_CARE_PROVIDER_SITE_OTHER)

## 2023-10-06 VITALS — BP 134/82 | HR 92 | Ht 68.0 in | Wt 292.0 lb

## 2023-10-06 DIAGNOSIS — F3160 Bipolar disorder, current episode mixed, unspecified: Secondary | ICD-10-CM

## 2023-10-06 DIAGNOSIS — F411 Generalized anxiety disorder: Secondary | ICD-10-CM

## 2023-10-06 DIAGNOSIS — F4312 Post-traumatic stress disorder, chronic: Secondary | ICD-10-CM

## 2023-10-06 MED ORDER — QUETIAPINE FUMARATE 100 MG PO TABS: 100.0000 mg | ORAL_TABLET | Freq: Every day | ORAL | 1 refills | Status: DC

## 2023-10-06 MED ORDER — FLUOXETINE HCL 20 MG PO CAPS: 20.0000 mg | ORAL_CAPSULE | Freq: Every day | ORAL | 1 refills | Status: DC

## 2023-10-07 ENCOUNTER — Encounter: Payer: Self-pay | Admitting: Family Medicine

## 2023-10-11 ENCOUNTER — Ambulatory Visit

## 2023-10-11 ENCOUNTER — Other Ambulatory Visit

## 2023-10-18 ENCOUNTER — Encounter (HOSPITAL_COMMUNITY): Payer: Self-pay

## 2023-10-18 ENCOUNTER — Other Ambulatory Visit

## 2023-10-18 ENCOUNTER — Telehealth (HOSPITAL_COMMUNITY): Payer: Self-pay

## 2023-10-18 ENCOUNTER — Ambulatory Visit (HOSPITAL_COMMUNITY)

## 2023-10-18 ENCOUNTER — Ambulatory Visit

## 2023-10-18 NOTE — Telephone Encounter (Signed)
 This therapist received a voice mail from [place] who says she realizes she missed an appointment today. She says she could not connect to the video because she cracked her phone and it is broken. She says she should have a phone in the next few days and asks that therapist call her in a few days to schedule her.  [name], MS, LMFT, LCAS

## 2023-10-18 NOTE — Progress Notes (Deleted)
 THERAPIST PROGRESS NOTE  Session Time: 1:00 pm  Virtual Visit via Video Note   I connected with [name] Brose  at 1:00 pm  EST by a video enabled telemedicine application and verified that I am speaking with the correct person using two identifiers.   Location: Patient: home Provider: [address] [place]   I discussed the limitations of evaluation and management by telemedicine and the availability of in person appointments. The patient expressed understanding and agreed to proceed.   Type of Therapy: Individual   Therapist Response/Interventions: Supportive listening/Grief therapy/ access for alcohol use and address if needed    Therapy Goals Addressed: Problem: Substance Use  Dates: Start:  09/15/23    Disciplines: Interdisciplinary, PROVIDER  Goal: [name] will abstain from alcohol use per her report and if she comes in person, also by breathalyzer  if indicated.  Dates: Start:  09/15/23   Expected End:  03/17/24    Disciplines: Interdisciplinary, PROVIDER  Goal: [name] will decrease her anxiety and depression by reporting them no higher than 4 on the GAD-7 and PHQ-9.  Dates: Expected End:  03/17/24    Disciplines: Interdisciplinary, PROVIDER  Intervention: Therapist will educate [name] about SUD's patterns and consequences of use, relapse risks, the treatment process and types of mutual support group and provide early recovery coping and relapse prevention skills  Dates: Start:  09/15/23    Intervention: Therapist will assists [name] in identifying and changing thought and behaviors that led to mood symptoms and anxiety symptoms.  Dates: Start:  09/15/23    Description: Fronie gives this therapist verbal permission to electronically sign her Care Plan    Summary: Fronie presents today for virtual therapy.   Progress Towards Goals: Limited progress  Suicidal/Homicidal: denies  Plan: Return again virtually on   Diagnosis: Post Traumatic Stress Disorder Generalized Anxiety  Disorder Alcohol Use Disorder, Severe, Dependence Bipolar Affective Disorder  Collaboration of Care: N/A  Patient/Guardian was advised Release of Information must be obtained prior to any record release in order to collaborate their care with an outside provider. Patient/Guardian was advised if they have not already done so to contact the registration department to sign all necessary forms in order for us  to release information regarding their care.   Consent: Patient/Guardian gives verbal consent for treatment and assignment of benefits for services provided during this visit. Patient/Guardian expressed understanding and agreed to proceed.   [name], MS LMFT, LCAS

## 2023-10-25 ENCOUNTER — Ambulatory Visit

## 2023-10-25 ENCOUNTER — Ambulatory Visit
Admission: EM | Admit: 2023-10-25 | Discharge: 2023-10-25 | Disposition: A | Discharge status: Other Acute Inpatient Hospital | Attending: Family Medicine | Admitting: Family Medicine

## 2023-10-25 ENCOUNTER — Other Ambulatory Visit

## 2023-10-25 DIAGNOSIS — R1032 Left lower quadrant pain: Secondary | ICD-10-CM

## 2023-10-25 HISTORY — DX: Anemia, unspecified: D64.9

## 2023-10-25 LAB — POCT URINE DIPSTICK
Bilirubin, UA: NEGATIVE
Blood, UA: NEGATIVE
Glucose, UA: NEGATIVE mg/dL
Ketones, POC UA: NEGATIVE mg/dL
Leukocytes, UA: NEGATIVE
Nitrite, UA: NEGATIVE
Protein Ur, POC: NEGATIVE mg/dL
Spec Grav, UA: 1.015 (ref 1.010–1.025)
Urobilinogen, UA: 0.2 U/dL
pH, UA: 6.5 (ref 5.0–8.0)

## 2023-10-25 LAB — POCT URINE PREGNANCY: Preg Test, Ur: POSITIVE — AB

## 2023-10-25 NOTE — ED Notes (Signed)
 Patient is being discharged from the Urgent Care and sent to the Emergency Department via POV . Per PB, patient is in need of higher level of care due to positive pregnancy test with recent fetal loss. Patient is aware and verbalizes understanding of plan of care.  Vitals:   10/25/23 1735  BP: (!) 145/83  Pulse: 92  Resp: 20  Temp: 98.6 F (37 C)  SpO2: 98%

## 2023-10-25 NOTE — ED Triage Notes (Signed)
 Patient reports symptoms starting last night with ha's, stomach ache (left lower) with radiation of pain to back with nausea. No current nausea. No vomiting.

## 2023-10-25 NOTE — ED Provider Notes (Signed)
 [facility]    CSN: [id number] Arrival date & time: 10/25/23  1547      History   Chief Complaint Chief Complaint  Patient presents with   Headache    HPI Katrina Stewart is a 28 y.o. female.    Headache  Here for headache and nausea and vomiting and left sided abdominal pain.  Yesterday she had nausea and threw up about twice.  The nausea is improved today.  She is having some headache now and also some left-sided abdominal pain, radiating into her left low back   Last normal bowel movement was yesterday. No dysuria or hematuria. No history of kidney stones Last menstrual cycle was August 23.  In late July she had an emergency C-section for her second twin about 25 weeks of gestation, due to complications from hardware related hydrops.  Baby passed away at day 4 She did already have the other 20 passed away and utero at [redacted] weeks gestation  Past Medical History:  Diagnosis Date   Acute pancreatitis 12/30/2022   Alcohol abuse 12/30/2022   Alcohol use disorder    Anemia    Anxiety    Anxiety and depression 12/30/2022   Bipolar affective disorder, current episode mixed (HCC) 05/05/2021   Bipolar disorder (manic depression) (HCC) 02/12/2022   BV (bacterial vaginosis) 07/19/2022   Chronic hypertension during pregnancy 11/20/2018   [X]  Aspirin  81 mg daily after 12 weeks  Current antihypertensives:  None      Baseline and surveillance labs (pulled in from [facility], refresh links as needed)           Lab Results      Component    Value    Date           PLT    315    11/20/2018           CREATININE    0.62    11/20/2018           AST    38    11/20/2018           ALT    11    11/20/2018           PROTCRRATIO              10/19/202   Chronic post-traumatic stress disorder (PTSD) 10/04/2022   Class 3 severe obesity due to excess calories without serious comorbidity with body mass index (BMI) of 45.0 to 49.9 in adult 05/05/2021   COVID-19 virus infection 12/05/2018    + again on 2/4 (asymptomatic)  postiive test in MAU on 11-2   Depression    Depression    Phreesia 12/17/2019   Depression    Phreesia 12/30/2019   Essential hypertension    Gestational diabetes mellitus (GDM)    Normal postpartum 2 hr GTT   Group B streptococcal infection in pregnancy 03/13/2019   History of depression 08/29/2018   History of gestational diabetes 10/03/2019   Iron  deficiency anemia 01/18/2023   Prediabetes 05/05/2021   Psychophysiological insomnia 05/05/2021   Rh negative state in antepartum period 09/03/2015   [x]  needs rhogam at 28 weeks  [name] postive anti-D on initial lab. Patient received rhogam on 08/02/18 during MAU visit   Rh negative state in antepartum period 09/03/2015   [x]  needs rhogam at 28 weeks     [name] postive anti-D on initial lab. Patient received rhogam on 08/02/18 during MAU visit     Rubella non-immune status, antepartum  09/03/2015   Severe episode of recurrent major depressive disorder, without psychotic features (HCC)    Suspected sleep apnea 01/18/2023   UTI (urinary tract infection)    Vaginal Pap smear, abnormal    Vitamin D  deficiency 01/18/2023    Patient Active Problem List   Diagnosis Date Noted   S/P cesarean section 09/02/2023   Loss of infant 08/24/2023   Parvovirus infection in mother during second trimester of pregnancy 08/17/2023   Obesity affecting pregnancy, antepartum 08/15/2023   Encounter for procreative genetic counseling 08/01/2023   Fetal hydrops 07/26/2023   Low fetal fraction NIPS 07/26/2023   Marginal insertion of umbilical cord affecting management of mother in third trimester (twin A) 07/13/2023   Twin A anomalies: marginal umbilical cord insertion, single umbilical artery,EIF, possible bladder outlet problem 07/13/2023   Anemia of pregnancy in second trimester 06/07/2023   Dichorionic diamniotic twin pregnancy 06/06/2023   Generalized anxiety disorder 05/17/2023   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    Supervision of high-risk pregnancy 05/03/2023   Suspected sleep apnea 01/18/2023   Chronic post-traumatic stress disorder (PTSD) 10/04/2022   Bipolar affective disorder, current episode mixed (HCC) 05/05/2021   Hx of Gestational diabetes mellitus (GDM), antepartum 01/08/2019   Chronic hypertension during pregnancy 11/20/2018   Rh negative state in antepartum period 09/03/2015    Past Surgical History:  Procedure Laterality Date   BREAST SURGERY  2013   Lt & Rt excision juvenile fibroadenoma   CESAREAN SECTION  08/20/2023   MASS EXCISION  10/29/2011   Procedure: EXCISION MASS;  Surgeon: Vicenta DELENA Poli, MD;  Location: [facility];  Service: General;  Laterality: Bilateral;  excision of bilateral breast masses    OB History     Gravida  4   Para  3   Term  2   Preterm  1   AB  1   Living  3      SAB  1   IAB  0   Ectopic      Multiple  1   Live Births  4            Home Medications    Prior to Admission medications   Medication Sig Start Date End Date Taking? Authorizing Provider  acetaminophen  (TYLENOL ) 500 MG tablet Take 1,000 mg by mouth every 6 (six) hours as needed.   Yes [provider]  FLUoxetine  (PROZAC ) 20 MG capsule Take 1 capsule (20 mg total) by mouth daily. 10/06/23  Yes Kapoor, Sahil, MD  labetalol  (NORMODYNE ) 100 MG tablet Take 100 mg by mouth 2 (two) times daily. 08/24/23 11/22/23 Yes [provider]  QUEtiapine  (SEROQUEL ) 100 MG tablet Take 1 tablet (100 mg total) by mouth at bedtime. 10/06/23  Yes Kapoor, Sahil, MD  apixaban  (ELIQUIS ) 5 MG TABS tablet Take 1 tablet (5 mg total) by mouth 2 (two) times daily. Start taking after completion of starter pack. 09/16/23   Sheree Penne Bruckner, MD  Apixaban  Starter Pack, 10mg  and 5mg , (ELIQUIS  DVT/PE STARTER PACK) Take as directed on package: start with two-5mg  tablets twice daily for 7 days. On day 8, switch to one-5mg  tablet twice daily. 09/16/23   Sheree Penne Bruckner, MD   Blood Pressure Monitoring (BLOOD PRESSURE KIT) DEVI 1 each by Does not apply route as needed. Patient not taking: Reported on 09/02/2023 05/03/23   Regino Camie DELENA, CNM  ibuprofen  (ADVIL ) 800 MG tablet Take 1 tablet (800 mg total) by mouth 3 (three) times daily with meals as needed for  headache or moderate pain (pain score 4-6). Patient not taking: Reported on 10/04/2023 08/31/23   Nicholaus Almarie HERO, MD  labetalol  (NORMODYNE ) 300 MG tablet Take 1 tablet (300 mg total) by mouth 2 (two) times daily. Patient not taking: Reported on 10/04/2023 08/31/23   Nicholaus Almarie HERO, MD  Misc. Devices (GOJJI WEIGHT SCALE) MISC 1 Device by Does not apply route as needed. Patient not taking: Reported on 09/02/2023 05/03/23   Regino Camie LABOR, CNM    Family History Family History  Problem Relation Age of Onset   Diabetes Mother    Hypertension Mother    Miscarriages / [place] Mother    Other Father        doesn't know what he died from   Asthma Brother    Anxiety disorder Brother    Cancer Maternal Grandmother        lung cancer   Cancer Other        bladder cancer   ADD / ADHD Brother    Diabetes Maternal Uncle    ADD / ADHD Son    Asthma Son     Social History Social History   Tobacco Use   Smoking status: Never    Passive exposure: Never   Smokeless tobacco: Never  Vaping Use   Vaping status: Never Used  Substance Use Topics   Alcohol use: Not Currently    Alcohol/week: 2.0 - 3.0 standard drinks of alcohol    Types: 2 - 3 Shots of liquor per week    Comment: occasional   Drug use: No     Allergies   Patient has no known allergies.   Review of Systems Review of Systems  Neurological:  Positive for headaches.     Physical Exam Triage Vital Signs ED Triage Vitals  Encounter Vitals Group     BP 10/25/23 1735 (!) 145/83     Girls Systolic BP Percentile --      Girls Diastolic BP Percentile --      Boys Systolic BP Percentile --      Boys Diastolic BP Percentile --       Pulse Rate 10/25/23 1735 92     Resp 10/25/23 1735 20     Temp 10/25/23 1735 98.6 F (37 C)     Temp Source 10/25/23 1735 Oral     SpO2 10/25/23 1735 98 %     Weight 10/25/23 1733 289 lb (131.1 kg)     Height 10/25/23 1733 5' 8 (1.727 m)     Head Circumference --      Peak Flow --      Pain Score 10/25/23 1731 7     Pain Loc --      Pain Education --      Exclude from Growth Chart --    No data found.  Updated Vital Signs BP (!) 145/83 (BP Location: Right Arm)   Pulse 92   Temp 98.6 F (37 C) (Oral)   Resp 20   Ht 5' 8 (1.727 m)   Wt 131.1 kg   LMP 09/24/2023 (Exact Date)   SpO2 98%   BMI 43.94 kg/m   Visual Acuity Right Eye Distance:   Left Eye Distance:   Bilateral Distance:    Right Eye Near:   Left Eye Near:    Bilateral Near:     Physical Exam Vitals reviewed.  Constitutional:      General: She is not in acute distress.    Appearance: She is not  ill-appearing, toxic-appearing or diaphoretic.  HENT:     Nose: Nose normal.     Mouth/Throat:     Mouth: Mucous membranes are moist.     Pharynx: No oropharyngeal exudate or posterior oropharyngeal erythema.  Eyes:     Extraocular Movements: Extraocular movements intact.     Conjunctiva/sclera: Conjunctivae normal.     Pupils: Pupils are equal, round, and reactive to light.  Cardiovascular:     Rate and Rhythm: Normal rate and regular rhythm.     Heart sounds: No murmur heard. Pulmonary:     Effort: Pulmonary effort is normal. No respiratory distress.     Breath sounds: No stridor. No wheezing, rhonchi or rales.  Abdominal:     General: Bowel sounds are normal. There is no distension.     Palpations: Abdomen is soft.     Tenderness: There is abdominal tenderness (Left upper quadrant and left lower quadrant). There is no guarding.  Musculoskeletal:     Cervical back: Neck supple.  Lymphadenopathy:     Cervical: No cervical adenopathy.  Skin:    Capillary Refill: Capillary refill takes less than 2  seconds.     Coloration: Skin is not jaundiced or pale.  Neurological:     General: No focal deficit present.     Mental Status: She is alert and oriented to person, place, and time.  Psychiatric:        Behavior: Behavior normal.      UC Treatments / Results  Labs (all labs ordered are listed, but only abnormal results are displayed) Labs Reviewed  POCT URINE PREGNANCY - Abnormal; Notable for the following components:      Result Value   Preg Test, Ur Positive (*)    All other components within normal limits  POCT URINE DIPSTICK - Normal    EKG   Radiology No results found.  Procedures Procedures (including critical care time)  Medications Ordered in UC Medications - No data to display  Initial Impression / Assessment and Plan / UC Course  I have reviewed the triage vital signs and the nursing notes.  Pertinent labs & imaging results that were available during my care of the patient were reviewed by me and considered in my medical decision making (see chart for details).     Urinalysis is clear.  Her pregnancy test is positive  With her having the left lower quadrant pain and being pregnant I think she needs to be evaluated in the maternal admissions unit for possible complications of pregnancy. Final Clinical Impressions(s) / UC Diagnoses   Final diagnoses:  Abdominal pain, left lower quadrant     Discharge Instructions      Please go to the maternal admissions unit at [facility] the [facility] through entrance to the for further evaluation     ED Prescriptions   None    PDMP not reviewed this encounter.   Vonna Sharlet POUR, MD 10/25/23 [phone]

## 2023-10-25 NOTE — Discharge Instructions (Signed)
 Please go to the maternal admissions unit at [facility] in the [facility] children Center through entrance to the for further evaluation

## 2023-10-26 NOTE — Progress Notes (Deleted)
 DVT Clinic Note  Name: Katrina Stewart     MRN: [id number]     DOB: [birth year]     Sex: female  PCP: Katrina Bleacher, MD  Today's Visit:    Referred to DVT Clinic by: Primary Care - Dr. Bleacher Katrina Referred to CPP by: Dr. Lanis Reason for referral: No chief complaint on file.  HISTORY OF PRESENT ILLNESS: Katrina Stewart is a 28 y.o. female with PMH HTN, GDM, PTSD, bipolar disorder, thalassemia alpha carrier, anemia, and obesity who presents for follow up medication management after diagnosis of superficial vein thrombosis in right upper extremity. She has a history of superficial vein thrombosis in the left greater saphenous vein in April during pregnancy for which she was treated with warm compresses and Tylenol  as needed for pain and symptoms resolved. Underwent emergency c-section on 08/20/23 and was discharged on 08/24/23. Pregnancy was complicated by fetal parvovirus and her son passed away in the NICU after birth. Patient was ~4 weeks postpartum when she had an ultrasound which showed age indeterminate superficial vein thrombosis involving the right basilic vein, occlusive thrombus in the proximal upper arm close to the confluence with the brachial vein and nonocclusive thrombus in the distal forearm. Seen in DVT Clinic on 09/16/23 to start treatment. Reported pain and swelling in her right arm began about a week prior. She had been using a compression wrap to help with the swelling, but said her arm was very painful. When she was hospitalized she had an IV in her right arm. Prior to pregnancy, she denied history of blood clots. Started on Eliquis  with plan to treat for 45 days for superficial vein thrombosis.   Today, patient reports ***. *** abnormal bleeding or bruising. *** missed doses of ***. *** wearing compression stockings.            Rx Insurance Coverage: Medicaid Rx Affordability: Eliquis  is $4 per 1 month supply Rx Assistance Provided: N/A Preferred Pharmacy: Starter  pack filled at [facility] during initial visit and remainder of treatment previously sent to [facility] per patient preference.  Past Medical History:  Diagnosis Date   Acute pancreatitis 12/30/2022   Alcohol abuse 12/30/2022   Alcohol use disorder    Anemia    Anxiety    Anxiety and depression 12/30/2022   Bipolar affective disorder, current episode mixed (HCC) 05/05/2021   Bipolar disorder (manic depression) (HCC) 02/12/2022   BV (bacterial vaginosis) 07/19/2022   Chronic hypertension during pregnancy 11/20/2018   [X]  Aspirin  81 mg daily after 12 weeks  Current antihypertensives:  None      Baseline and surveillance labs (pulled in from [facility], refresh links as needed)           Lab Results      Component    Value    Date           PLT    315    11/20/2018           CREATININE    0.62    11/20/2018           AST    38    11/20/2018           ALT    11    11/20/2018           PROTCRRATIO              10/19/202   Chronic post-traumatic stress disorder (PTSD) 10/04/2022   Class 3 severe obesity due  to excess calories without serious comorbidity with body mass index (BMI) of 45.0 to 49.9 in adult 05/05/2021   COVID-19 virus infection 12/05/2018   + again on 2/4 (asymptomatic)  postiive test in MAU on 11-2   Depression    Depression    Phreesia 12/17/2019   Depression    Phreesia 12/30/2019   Essential hypertension    Gestational diabetes mellitus (GDM)    Normal postpartum 2 hr GTT   Group B streptococcal infection in pregnancy 03/13/2019   History of depression 08/29/2018   History of gestational diabetes 10/03/2019   Iron  deficiency anemia 01/18/2023   Prediabetes 05/05/2021   Psychophysiological insomnia 05/05/2021   Rh negative state in antepartum period 09/03/2015   [x]  needs rhogam at 28 weeks  [name] postive anti-D on initial lab. Patient received rhogam on 08/02/18 during MAU visit   Rh negative state in antepartum period 09/03/2015   [x]  needs rhogam at 28 weeks      [name] postive anti-D on initial lab. Patient received rhogam on 08/02/18 during MAU visit     Rubella non-immune status, antepartum 09/03/2015   Severe episode of recurrent major depressive disorder, without psychotic features (HCC)    Suspected sleep apnea 01/18/2023   UTI (urinary tract infection)    Vaginal Pap smear, abnormal    Vitamin D  deficiency 01/18/2023    Past Surgical History:  Procedure Laterality Date   BREAST SURGERY  2013   Lt & Rt excision juvenile fibroadenoma   CESAREAN SECTION  08/20/2023   MASS EXCISION  10/29/2011   Procedure: EXCISION MASS;  Surgeon: Vicenta DELENA Poli, MD;  Location: [facility];  Service: General;  Laterality: Bilateral;  excision of bilateral breast masses    Social History   Socioeconomic History   Marital status: Single    Spouse name: Not on file   Number of children: Not on file   Years of education: Not on file   Highest education level: Some college, no degree  Occupational History    Comment: unemployed  Tobacco Use   Smoking status: Never    Passive exposure: Never   Smokeless tobacco: Never  Vaping Use   Vaping status: Never Used  Substance and Sexual Activity   Alcohol use: Not Currently    Alcohol/week: 2.0 - 3.0 standard drinks of alcohol    Types: 2 - 3 Shots of liquor per week    Comment: occasional   Drug use: No   Sexual activity: Not Currently    Birth control/protection: None  Other Topics Concern   Not on file  Social History Narrative   Not on file   Social Drivers of Health   Financial Resource Strain: Low Risk  (02/08/2023)   Overall Financial Resource Strain (CARDIA)    Difficulty of Paying Living Expenses: Not hard at all  Food Insecurity: No Food Insecurity (09/02/2023)   Hunger Vital Sign    Worried About Running Out of Food in the Last Year: Never true    Ran Out of Food in the Last Year: Never true  Transportation Needs: No Transportation Needs (09/02/2023)   PRAPARE - Scientist, research (physical sciences) (Medical): No    Lack of Transportation (Non-Medical): No  Physical Activity: Insufficiently Active (02/08/2023)   Exercise Vital Sign    Days of Exercise per Week: 5 days    Minutes of Exercise per Session: 20 min  Stress: Stress Concern Present (02/08/2023)   [employer] of Occupational Health - Occupational  Stress Questionnaire    Feeling of Stress : Very much  Social Connections: Moderately Isolated (02/08/2023)   Social Connection and Isolation Panel    Frequency of Communication with Friends and Family: More than three times a week    Frequency of Social Gatherings with Friends and Family: More than three times a week    Attends Religious Services: 1 to 4 times per year    Active Member of [employer] or Organizations: No    Attends Engineer, structural: Not on file    Marital Status: Never married  Intimate Partner Violence: Not At Risk (08/31/2023)   Humiliation, Afraid, Rape, and Kick questionnaire    Fear of Current or Ex-Partner: No    Emotionally Abused: No    Physically Abused: No    Sexually Abused: No    Family History  Problem Relation Age of Onset   Diabetes Mother    Hypertension Mother    Miscarriages / [place] Mother    Other Father        doesn't know what he died from   Asthma Brother    Anxiety disorder Brother    Cancer Maternal Grandmother        lung cancer   Cancer Other        bladder cancer   ADD / ADHD Brother    Diabetes Maternal Uncle    ADD / ADHD Son    Asthma Son     Allergies as of 10/27/2023   (No Known Allergies)    Current Outpatient Medications on File Prior to Visit  Medication Sig Dispense Refill   acetaminophen  (TYLENOL ) 500 MG tablet Take 1,000 mg by mouth every 6 (six) hours as needed.     apixaban  (ELIQUIS ) 5 MG TABS tablet Take 1 tablet (5 mg total) by mouth 2 (two) times daily. Start taking after completion of starter pack. 30 tablet 0   Apixaban  Starter Pack, 10mg  and 5mg , (ELIQUIS  DVT/PE STARTER  PACK) Take as directed on package: start with two-5mg  tablets twice daily for 7 days. On day 8, switch to one-5mg  tablet twice daily. 74 each 0   Blood Pressure Monitoring (BLOOD PRESSURE KIT) DEVI 1 each by Does not apply route as needed. (Patient not taking: Reported on 09/02/2023) 1 each 0   FLUoxetine  (PROZAC ) 20 MG capsule Take 1 capsule (20 mg total) by mouth daily. 30 capsule 1   ibuprofen  (ADVIL ) 800 MG tablet Take 1 tablet (800 mg total) by mouth 3 (three) times daily with meals as needed for headache or moderate pain (pain score 4-6). (Patient not taking: Reported on 10/04/2023) 60 tablet 0   labetalol  (NORMODYNE ) 100 MG tablet Take 100 mg by mouth 2 (two) times daily.     labetalol  (NORMODYNE ) 300 MG tablet Take 1 tablet (300 mg total) by mouth 2 (two) times daily. (Patient not taking: Reported on 10/04/2023) 180 tablet 0   Misc. Devices (GOJJI WEIGHT SCALE) MISC 1 Device by Does not apply route as needed. (Patient not taking: Reported on 09/02/2023) 1 each 0   QUEtiapine  (SEROQUEL ) 100 MG tablet Take 1 tablet (100 mg total) by mouth at bedtime. 30 tablet 1   No current facility-administered medications on file prior to visit.   REVIEW OF SYSTEMS:  ROS PHYSICAL EXAMINATION:  There were no vitals filed for this visit.  There is no height or weight on file to calculate BMI.  Physical Exam Villalta Score for Post-Thrombotic Syndrome:    LABS:  CBC  Component Value Date/Time   WBC 6.4 08/31/2023 1650   RBC 4.70 08/31/2023 1650   HGB 9.1 (L) 08/31/2023 1650   HGB 10.6 (L) 06/06/2023 1636   HCT 31.4 (L) 08/31/2023 1650   HCT 34.4 06/06/2023 1636   PLT 393 08/31/2023 1650   PLT 320 06/06/2023 1636   MCV 66.8 (L) 08/31/2023 1650   MCV 71 (L) 06/06/2023 1636   MCH 19.4 (L) 08/31/2023 1650   MCHC 29.0 (L) 08/31/2023 1650   RDW 18.6 (H) 08/31/2023 1650   RDW 16.3 (H) 06/06/2023 1636   LYMPHSABS 1.8 05/10/2023 1640   MONOABS 0.8 03/24/2019 0501   EOSABS 0.2 05/10/2023 1640    BASOSABS 0.0 05/10/2023 1640    Hepatic Function      Component Value Date/Time   PROT 7.4 08/31/2023 1650   PROT 6.9 06/06/2023 1636   ALBUMIN 3.4 (L) 08/31/2023 1650   ALBUMIN 4.1 06/06/2023 1636   AST 20 08/31/2023 1650   ALT 16 08/31/2023 1650   ALKPHOS 60 08/31/2023 1650   BILITOT 0.4 08/31/2023 1650   BILITOT 0.3 06/06/2023 1636   BILIDIR <0.1 12/30/2022 0622   IBILI NOT CALCULATED 12/30/2022 0622    Renal Function   Lab Results  Component Value Date   CREATININE 0.74 08/31/2023   CREATININE 0.68 06/06/2023   CREATININE 0.77 05/10/2023    CrCl cannot be calculated (Patient's most recent lab result is older than the maximum 21 days allowed.).   [facility] Vascular Lab Studies:  09/16/23 VAS US  UPPER EXTREMITY VENOUS DUPLEX: Summary:  Right:  No evidence of deep vein thrombosis in the upper extremity. Findings  consistent with age indeterminate superficial vein thrombosis involving the right  basilic vein. Occlusive in the proximal upper arm, close to the confluence with  the brachial vein; non occlusive in the distal forearm.   ASSESSMENT:    Patient without prior history of DVT diagnosed with superficial vein thrombosis involving the right basilic vein which is occlusive in the proximal upper arm close to the confluence with the brachial vein and non occlusive in the distal forearm. She has a history of superficial vein thrombosis in the left greater saphenous vein during pregnancy. Risk factors for thrombosis include recent peripheral IV while she was hospitalized. She is also within 6 weeks post-partum and cesarean section. Pregnancy was complicated by fetal parvovirus infection and her son passed away in the NICU after birth so she is not currently breastfeeding. Discussed the patient with Dr. Sheree who recommended to treat with anticoagulation for 45 days for superficial vein thrombosis. Will start on Eliquis  today. Chronic anemia on labs, platelets within normal limits.  Counseled to keep follow up appointment with OB/GYN next week and let them know she has started taking Eliquis . She is currently taking ibuprofen  and Tylenol  as needed for pain. Discussed preference for Tylenol  due to increased risk of bleeding with NSAIDs and Eliquis . Extensively counseled on Eliquis  and all patient questions were answered. No barriers to medication access or adherence identified today. Will follow up with her in 5-6 weeks and counseled her to reach out in the meantime if she has any additional questions or concerns.   PLAN: {DVT Clinic Eojw:71604}  Patient is discharged from the DVT Clinic:  {DVT CLINIC DISCHARGE PWQN:71609} Follow up: ***  Izetta Henry, PharmD [facility] Clinical Pharmacist

## 2023-10-27 ENCOUNTER — Telehealth (HOSPITAL_COMMUNITY): Payer: Self-pay

## 2023-10-27 ENCOUNTER — Ambulatory Visit: Admitting: Pharmacist

## 2023-10-27 ENCOUNTER — Telehealth: Payer: Self-pay | Admitting: Pharmacist

## 2023-10-27 NOTE — Telephone Encounter (Signed)
 Received a call from patient requesting to reschedule her appointment for this morning as she did not have transportation. Visit is rescheduled for tomorrow at 11 AM per patient preference. Confirmed that she stopped taking Eliquis  after positive urine pregnancy test on 10/25/23. Initially planned for 45 days of Eliquis , but given she now has a positive pregnancy test and was near the end of 45 days of treatment for only upper extremity superficial vein thrombosis, appropriate to stop anticoagulation. She stated she had reached out to her OB at [facility] to notify them and was awaiting a return call.

## 2023-10-27 NOTE — Telephone Encounter (Signed)
 Note in error.

## 2023-10-27 NOTE — Progress Notes (Unsigned)
 DVT Clinic Note  Name: Katrina Stewart     MRN: [id number]     DOB: [birth year]     Sex: female  PCP: Tanda Bleacher, MD  Today's Visit: Visit Information: Follow Up Visit  Referred to DVT Clinic by: Primary Care - Dr. Bleacher Tanda Referred to CPP by: Dr. Gretta Reason for referral:  Chief Complaint  Patient presents with   Superficial vein thrombosis   HISTORY OF PRESENT ILLNESS: Katrina Stewart is a 28 y.o. female with PMH HTN, GDM, PTSD, bipolar disorder, thalassemia alpha carrier, anemia, and obesity who presents for follow up medication management after diagnosis of superficial vein thrombosis in right upper extremity. She has a history of superficial vein thrombosis in the left greater saphenous vein in April during pregnancy for which she was treated with warm compresses and Tylenol  as needed for pain and symptoms resolved. Underwent emergency c-section on 08/20/23 and was discharged on 08/24/23. Twin pregnancy was complicated by fetal parvovirus and one twin died at [redacted]w[redacted]d with hydrops and the other twin passed away in the NICU after birth. Patient was ~4 weeks postpartum when she had an ultrasound which showed age indeterminate superficial vein thrombosis involving the right basilic vein, occlusive thrombus in the proximal upper arm close to the confluence with the brachial vein and nonocclusive thrombus in the distal forearm. Initially seen in DVT Clinic on 09/16/23. When she was hospitalized she had an IV in her right arm. Prior to pregnancy, she denied history of blood clots. She was started on Eliquis  with plan to treat for 45 days for superficial vein thrombosis. Of note, patient was seen at urgent care on 10/25/23 for abdominal pain, nausea, vomiting and urine pregnancy test was positive. She was instructed to present to the maternal admissions unit for further evaluation, but did not do so. Patient called DVT Clinic yesterday to reschedule her appointment due to transportation issues.  Confirmed she had stopped taking Eliquis  after positive urine pregnancy test and was attempting to call her OB at [facility] to notify them.  Today, patient reports her arm pain and swelling has completely resolved and she has no symptoms in her right arm. Again confirmed she has stopped Eliquis  after positive urine pregnancy test and she only had ~5 days of medication left on hand. Reports that she tolerated Eliquis  well and had no issues with abnormal bleeding or bruising. Reports improvement in GI symptoms.  Positive Thrombotic Risk Factors: Recent cesarean section (within 3 months), Within 6 weeks postpartum, Obesity, Other (comment) (peripheral IV in right arm) Bleeding Risk Factors: Anemia  Negative Thrombotic Risk Factors: Previous VTE, Recent surgery (within 3 months), Recent trauma (within 3 months), Recent admission to hospital with acute illness (within 3 months), Paralysis, paresis, or recent plaster cast immobilization of lower extremity, Central venous catheterization, Bed rest >72 hours within 3 months, Sedentary journey lasting >8 hours within 4 weeks, Pregnancy, Estrogen therapy, Recent COVID diagnosis (within 3 months), Erythropoiesis-stimulating agent, Testosterone therapy, Active cancer, Non-malignant, chronic inflammatory condition, Known thrombophilic condition, Smoking, Older age  Rx Insurance Coverage: Medicaid Rx Affordability: Eliquis  is $4 per 1 month supply Rx Assistance Provided: N/A Preferred Pharmacy: Starter pack filled at [facility] during initial visit and remainder of treatment previously sent to [facility] per patient preference.  Past Medical History:  Diagnosis Date   Acute pancreatitis 12/30/2022   Alcohol abuse 12/30/2022   Alcohol use disorder    Anemia    Anxiety    Anxiety and depression 12/30/2022  Bipolar affective disorder, current episode mixed (HCC) 05/05/2021   Bipolar disorder (manic depression) (HCC) 02/12/2022   BV (bacterial vaginosis)  07/19/2022   Chronic hypertension during pregnancy 11/20/2018   [X]  Aspirin  81 mg daily after 12 weeks  Current antihypertensives:  None      Baseline and surveillance labs (pulled in from [facility], refresh links as needed)           Lab Results      Component    Value    Date           PLT    315    11/20/2018           CREATININE    0.62    11/20/2018           AST    38    11/20/2018           ALT    11    11/20/2018           PROTCRRATIO              10/19/202   Chronic post-traumatic stress disorder (PTSD) 10/04/2022   Class 3 severe obesity due to excess calories without serious comorbidity with body mass index (BMI) of 45.0 to 49.9 in adult 05/05/2021   COVID-19 virus infection 12/05/2018   + again on 2/4 (asymptomatic)  postiive test in MAU on 11-2   Depression    Depression    Phreesia 12/17/2019   Depression    Phreesia 12/30/2019   Essential hypertension    Gestational diabetes mellitus (GDM)    Normal postpartum 2 hr GTT   Group B streptococcal infection in pregnancy 03/13/2019   History of depression 08/29/2018   History of gestational diabetes 10/03/2019   Iron  deficiency anemia 01/18/2023   Prediabetes 05/05/2021   Psychophysiological insomnia 05/05/2021   Rh negative state in antepartum period 09/03/2015   [x]  needs rhogam at 28 weeks  [name] postive anti-D on initial lab. Patient received rhogam on 08/02/18 during MAU visit   Rh negative state in antepartum period 09/03/2015   [x]  needs rhogam at 28 weeks     [name] postive anti-D on initial lab. Patient received rhogam on 08/02/18 during MAU visit     Rubella non-immune status, antepartum 09/03/2015   Severe episode of recurrent major depressive disorder, without psychotic features (HCC)    Suspected sleep apnea 01/18/2023   UTI (urinary tract infection)    Vaginal Pap smear, abnormal    Vitamin D  deficiency 01/18/2023    Past Surgical History:  Procedure Laterality Date   BREAST SURGERY  2013   Lt & Rt excision  juvenile fibroadenoma   CESAREAN SECTION  08/20/2023   MASS EXCISION  10/29/2011   Procedure: EXCISION MASS;  Surgeon: Vicenta DELENA Poli, MD;  Location: [facility];  Service: General;  Laterality: Bilateral;  excision of bilateral breast masses    Social History   Socioeconomic History   Marital status: Single    Spouse name: Not on file   Number of children: Not on file   Years of education: Not on file   Highest education level: Some college, no degree  Occupational History    Comment: unemployed  Tobacco Use   Smoking status: Never    Passive exposure: Never   Smokeless tobacco: Never  Vaping Use   Vaping status: Never Used  Substance and Sexual Activity   Alcohol use: Not Currently    Alcohol/week: 2.0 - 3.0 standard  drinks of alcohol    Types: 2 - 3 Shots of liquor per week    Comment: occasional   Drug use: No   Sexual activity: Not Currently    Birth control/protection: None  Other Topics Concern   Not on file  Social History Narrative   Not on file   Social Drivers of Health   Financial Resource Strain: Low Risk  (02/08/2023)   Overall Financial Resource Strain (CARDIA)    Difficulty of Paying Living Expenses: Not hard at all  Food Insecurity: No Food Insecurity (09/02/2023)   Hunger Vital Sign    Worried About Running Out of Food in the Last Year: Never true    Ran Out of Food in the Last Year: Never true  Transportation Needs: No Transportation Needs (09/02/2023)   PRAPARE - Administrator, Civil Service (Medical): No    Lack of Transportation (Non-Medical): No  Physical Activity: Insufficiently Active (02/08/2023)   Exercise Vital Sign    Days of Exercise per Week: 5 days    Minutes of Exercise per Session: 20 min  Stress: Stress Concern Present (02/08/2023)   [employer] of Occupational Health - Occupational Stress Questionnaire    Feeling of Stress : Very much  Social Connections: Moderately Isolated (02/08/2023)   Social Connection and Isolation  Panel    Frequency of Communication with Friends and Family: More than three times a week    Frequency of Social Gatherings with Friends and Family: More than three times a week    Attends Religious Services: 1 to 4 times per year    Active Member of [employer] or Organizations: No    Attends Engineer, structural: Not on file    Marital Status: Never married  Intimate Partner Violence: Not At Risk (08/31/2023)   Humiliation, Afraid, Rape, and Kick questionnaire    Fear of Current or Ex-Partner: No    Emotionally Abused: No    Physically Abused: No    Sexually Abused: No    Family History  Problem Relation Age of Onset   Diabetes Mother    Hypertension Mother    Miscarriages / [place] Mother    Other Father        doesn't know what he died from   Asthma Brother    Anxiety disorder Brother    Cancer Maternal Grandmother        lung cancer   Cancer Other        bladder cancer   ADD / ADHD Brother    Diabetes Maternal Uncle    ADD / ADHD Son    Asthma Son     Allergies as of 10/28/2023   (No Known Allergies)    Current Outpatient Medications on File Prior to Visit  Medication Sig Dispense Refill   acetaminophen  (TYLENOL ) 500 MG tablet Take 1,000 mg by mouth every 6 (six) hours as needed.     FLUoxetine  (PROZAC ) 20 MG capsule Take 1 capsule (20 mg total) by mouth daily. 30 capsule 1   QUEtiapine  (SEROQUEL ) 100 MG tablet Take 1 tablet (100 mg total) by mouth at bedtime. 30 tablet 1   Blood Pressure Monitoring (BLOOD PRESSURE KIT) DEVI 1 each by Does not apply route as needed. (Patient not taking: Reported on 09/02/2023) 1 each 0   Misc. Devices (GOJJI WEIGHT SCALE) MISC 1 Device by Does not apply route as needed. (Patient not taking: Reported on 09/02/2023) 1 each 0   No current facility-administered medications on file  prior to visit.   REVIEW OF SYSTEMS:  Review of Systems  Respiratory:  Negative for shortness of breath.   Musculoskeletal:  Negative for myalgias.    PHYSICAL EXAMINATION:  Vitals:   10/28/23 1101  BP: 129/85  Pulse: 85  SpO2: 100%    There is no height or weight on file to calculate BMI.  Physical Exam Vitals reviewed.  Cardiovascular:     Rate and Rhythm: Normal rate.  Pulmonary:     Effort: Pulmonary effort is normal.  Musculoskeletal:        General: No swelling (right arm) or tenderness (right arm).  Neurological:     Mental Status: She is alert.    LABS:  CBC     Component Value Date/Time   WBC 6.4 08/31/2023 1650   RBC 4.70 08/31/2023 1650   HGB 9.1 (L) 08/31/2023 1650   HGB 10.6 (L) 06/06/2023 1636   HCT 31.4 (L) 08/31/2023 1650   HCT 34.4 06/06/2023 1636   PLT 393 08/31/2023 1650   PLT 320 06/06/2023 1636   MCV 66.8 (L) 08/31/2023 1650   MCV 71 (L) 06/06/2023 1636   MCH 19.4 (L) 08/31/2023 1650   MCHC 29.0 (L) 08/31/2023 1650   RDW 18.6 (H) 08/31/2023 1650   RDW 16.3 (H) 06/06/2023 1636   LYMPHSABS 1.8 05/10/2023 1640   MONOABS 0.8 03/24/2019 0501   EOSABS 0.2 05/10/2023 1640   BASOSABS 0.0 05/10/2023 1640    Hepatic Function      Component Value Date/Time   PROT 7.4 08/31/2023 1650   PROT 6.9 06/06/2023 1636   ALBUMIN 3.4 (L) 08/31/2023 1650   ALBUMIN 4.1 06/06/2023 1636   AST 20 08/31/2023 1650   ALT 16 08/31/2023 1650   ALKPHOS 60 08/31/2023 1650   BILITOT 0.4 08/31/2023 1650   BILITOT 0.3 06/06/2023 1636   BILIDIR <0.1 12/30/2022 0622   IBILI NOT CALCULATED 12/30/2022 0622    Renal Function   Lab Results  Component Value Date   CREATININE 0.74 08/31/2023   CREATININE 0.68 06/06/2023   CREATININE 0.77 05/10/2023    CrCl cannot be calculated (Patient's most recent lab result is older than the maximum 21 days allowed.).   VVS Vascular Lab Studies:  09/16/23 VAS US  UPPER EXTREMITY VENOUS DUPLEX: Summary:  Right:  No evidence of deep vein thrombosis in the upper extremity. Findings  consistent with age indeterminate superficial vein thrombosis involving the right  basilic vein.  Occlusive in the proximal upper arm, close to the confluence with  the brachial vein; non occlusive in the distal forearm.   ASSESSMENT: Location of thrombus: right upper extremity Cause of thrombus: provoked by a transient risk factor  Patient without prior history of DVT diagnosed with superficial vein thrombosis involving the right basilic vein, occlusive in the proximal upper arm close to the confluence with the brachial vein and non occlusive in the distal forearm. She has a history of superficial vein thrombosis in the left greater saphenous vein during pregnancy. Risk factors for thrombosis included recent peripheral IV while she was hospitalized and being within 6 weeks postpartum and cesarean section. Planned to treat with Eliquis  for 45 days for superficial vein thrombosis, but patient had a positive urine pregnancy test on 10/25/23 at urgent care and appropriately stopped Eliquis  after this. She was already near the end of 45 days of treatment and all of her arm pain and swelling has completely resolved. No need for further anticoagulation or follow up imaging at this time. Counseled  on the importance of getting in contact with her OB and she stated she was awaiting a call back from them today. Need to make sure OB is aware she was taking Eliquis  as the use in pregnancy is not recommended and data is insufficient to evaluate safety in pregnancy. She has follow up with her PCP on 11/15/23.  Follow up: OB/GYN and PCP   Izetta Henry, PharmD [facility] Clinical Pharmacist [phone]

## 2023-10-27 NOTE — Telephone Encounter (Signed)
 Therapist calls pt per her requests as she missed the last appointment due to IT difficulties with her phone. She asks therapist to wait a few days so she could obtain a new phone and then call to reschedule.  [name] answers the phone and therapist confirms her identify by obtaining two verifiers. Therapist offers her the first therapy appointment this therapist has available which is November 24, 2023 at [facility] requests this appointment be virtual.  Therapist asks front desk to schedule her.  [name], MS, LMFT, LCAS

## 2023-10-28 ENCOUNTER — Ambulatory Visit: Attending: Vascular Surgery | Admitting: Pharmacist

## 2023-10-28 VITALS — BP 129/85 | HR 85

## 2023-10-28 DIAGNOSIS — I82890 Acute embolism and thrombosis of other specified veins: Secondary | ICD-10-CM | POA: Insufficient documentation

## 2023-10-30 ENCOUNTER — Inpatient Hospital Stay (HOSPITAL_COMMUNITY)
Admission: AD | Admit: 2023-10-30 | Discharge: 2023-10-30 | Disposition: A | Discharge status: Home / Self Care | Attending: Obstetrics & Gynecology | Admitting: Obstetrics & Gynecology

## 2023-10-30 ENCOUNTER — Other Ambulatory Visit: Payer: Self-pay

## 2023-10-30 ENCOUNTER — Encounter (HOSPITAL_COMMUNITY): Payer: Self-pay | Admitting: Obstetrics & Gynecology

## 2023-10-30 ENCOUNTER — Inpatient Hospital Stay (HOSPITAL_COMMUNITY)

## 2023-10-30 DIAGNOSIS — R1012 Left upper quadrant pain: Secondary | ICD-10-CM | POA: Insufficient documentation

## 2023-10-30 DIAGNOSIS — R112 Nausea with vomiting, unspecified: Secondary | ICD-10-CM | POA: Diagnosis present

## 2023-10-30 DIAGNOSIS — O99611 Diseases of the digestive system complicating pregnancy, first trimester: Secondary | ICD-10-CM | POA: Insufficient documentation

## 2023-10-30 DIAGNOSIS — K219 Gastro-esophageal reflux disease without esophagitis: Secondary | ICD-10-CM | POA: Diagnosis not present

## 2023-10-30 DIAGNOSIS — R1010 Upper abdominal pain, unspecified: Secondary | ICD-10-CM | POA: Diagnosis present

## 2023-10-30 DIAGNOSIS — O26891 Other specified pregnancy related conditions, first trimester: Secondary | ICD-10-CM | POA: Diagnosis not present

## 2023-10-30 DIAGNOSIS — O219 Vomiting of pregnancy, unspecified: Secondary | ICD-10-CM | POA: Diagnosis not present

## 2023-10-30 DIAGNOSIS — Z3A01 Less than 8 weeks gestation of pregnancy: Secondary | ICD-10-CM | POA: Diagnosis not present

## 2023-10-30 DIAGNOSIS — R519 Headache, unspecified: Secondary | ICD-10-CM | POA: Diagnosis not present

## 2023-10-30 DIAGNOSIS — Z113 Encounter for screening for infections with a predominantly sexual mode of transmission: Secondary | ICD-10-CM | POA: Diagnosis present

## 2023-10-30 LAB — COMPREHENSIVE METABOLIC PANEL WITH GFR
ALT: 12 U/L (ref 0–44)
AST: 14 U/L — ABNORMAL LOW (ref 15–41)
Albumin: 4.2 g/dL (ref 3.5–5.0)
Alkaline Phosphatase: 43 U/L (ref 38–126)
Anion gap: 13 (ref 5–15)
BUN: 8 mg/dL (ref 6–20)
CO2: 17 mmol/L — ABNORMAL LOW (ref 22–32)
Calcium: 9.4 mg/dL (ref 8.9–10.3)
Chloride: 108 mmol/L (ref 98–111)
Creatinine, Ser: 0.82 mg/dL (ref 0.44–1.00)
GFR, Estimated: >60 mL/min (ref >60)
Glucose, Bld: 77 mg/dL (ref 70–99)
Potassium: 3.6 mmol/L (ref 3.5–5.1)
Sodium: 138 mmol/L (ref 135–145)
Total Bilirubin: 0.6 mg/dL (ref 0.0–1.2)
Total Protein: 7.6 g/dL (ref 6.5–8.1)

## 2023-10-30 LAB — CBC
HCT: 31.5 % — ABNORMAL LOW (ref 36.0–46.0)
Hemoglobin: 8.8 g/dL — ABNORMAL LOW (ref 12.0–15.0)
MCH: 17.6 pg — ABNORMAL LOW (ref 26.0–34.0)
MCHC: 27.9 g/dL — ABNORMAL LOW (ref 30.0–36.0)
MCV: 63.1 fL — ABNORMAL LOW (ref 80.0–100.0)
Platelets: 333 K/uL (ref 150–400)
RBC: 4.99 MIL/uL (ref 3.87–5.11)
RDW: 20.8 % — ABNORMAL HIGH (ref 11.5–15.5)
WBC: 6.2 K/uL (ref 4.0–10.5)
nRBC: 0 % (ref 0.0–0.2)

## 2023-10-30 LAB — WET PREP, GENITAL
Sperm: NONE SEEN
Trich, Wet Prep: NONE SEEN
WBC, Wet Prep HPF POC: >=10 — AB (ref <10)
Yeast Wet Prep HPF POC: NONE SEEN

## 2023-10-30 LAB — ABO/RH: ABO/RH(D): B NEG

## 2023-10-30 LAB — LIPASE, BLOOD: Lipase: 31 U/L (ref 11–51)

## 2023-10-30 LAB — HCG, QUANTITATIVE, PREGNANCY: hCG, Beta Chain, Quant, S: 892 m[IU]/mL — ABNORMAL HIGH (ref <5)

## 2023-10-30 LAB — AMYLASE: Amylase: 38 U/L (ref 28–100)

## 2023-10-30 MED ORDER — METRONIDAZOLE 500 MG PO TABS: 500.0000 mg | ORAL_TABLET | Freq: Two times a day (BID) | ORAL | 0 refills | Status: DC

## 2023-10-30 MED ORDER — LACTATED RINGERS IV BOLUS
1000.0000 mL | Freq: Once | INTRAVENOUS | Status: AC
  Administered 2023-10-30: 1000 mL via INTRAVENOUS

## 2023-10-30 MED ORDER — FAMOTIDINE 20 MG PO TABS: 20.0000 mg | ORAL_TABLET | Freq: Two times a day (BID) | ORAL | 0 refills | Status: DC

## 2023-10-30 MED ORDER — METOCLOPRAMIDE HCL 5 MG/ML IJ SOLN
10.0000 mg | Freq: Once | INTRAMUSCULAR | Status: AC
  Administered 2023-10-30: 10 mg via INTRAVENOUS
  Filled 2023-10-30: qty 2

## 2023-10-30 MED ORDER — METOCLOPRAMIDE HCL 10 MG PO TABS: 10.0000 mg | ORAL_TABLET | Freq: Four times a day (QID) | ORAL | 0 refills | Status: DC

## 2023-10-30 MED ORDER — FAMOTIDINE IN NACL 20-0.9 MG/50ML-% IV SOLN
20.0000 mg | Freq: Once | INTRAVENOUS | Status: AC
  Administered 2023-10-30: 20 mg via INTRAVENOUS
  Filled 2023-10-30: qty 50

## 2023-10-30 MED ORDER — DIPHENHYDRAMINE HCL 50 MG/ML IJ SOLN
25.0000 mg | Freq: Once | INTRAMUSCULAR | Status: AC
  Administered 2023-10-30: 25 mg via INTRAVENOUS
  Filled 2023-10-30: qty 1

## 2023-10-30 NOTE — MAU Note (Signed)
 Katrina Stewart is a 28 y.o. at Unknown here in MAU reporting: was told she was pregnant on 10/25/23 at [facility]. Starting feeling bad on 10/24/23 c/o upper abdominal pain, back pain, nausea, vomiting, diarrhea, and headache. Upper abdominal pain is worse when she eats. Reports 3 vomiting episodes a day. Has taken tylenol  with no relief. Denies any VB. Patient denies any unusual vaginal discharge, vaginal odor, itching or pain. Patient denies any pain with urination or urinary frequency.  Denies any recent sexual intercourse.  Diagnosed with pancreatitis in November 2024, has had no follow ups.  LMP: 09/24/23 Onset of complaint: 10/24/23 Pain score: HA 8  Back 7  abd 9 Vitals:   10/30/23 1734  BP: (!) 151/85  Pulse: 90  Resp: 18  Temp: 98.4 F (36.9 C)  SpO2: 100%     FHT:n/a Lab orders placed from triage:  pregnancy test, UA

## 2023-10-30 NOTE — MAU Provider Note (Cosign Needed Addendum)
 History     CSN: [id number]  Arrival date and time: 10/30/23 1704   Event Date/Time   First Provider Initiated Contact with Patient 10/30/23 1752      Chief Complaint  Patient presents with   Abdominal Pain   Back Pain   Headache   Emesis   Nausea   Diarrhea   HPI Katrina Stewart is a 28 y.o. [id number] at [redacted]w[redacted]d who presents with n/v, headache, & abdominal pain.  Symptoms started last week.  Went to urgent care on 9/23 and found out she was pregnant.  Reports vomiting 3 times a day and having some episodes of diarrhea.  Abdominal pain is mostly midepigastric to left upper quadrant pain.  Worse with eating.  Has been taking Tylenol  for pain and headache.  No relief.  No antiemetics for nausea and vomiting.  Denies lower abdominal pain or vaginal bleeding.  Reports low back pain.  Has history of pancreatitis last year, states symptoms feel similar but cannot differentiate between history and pregnancy related symptoms.  Current headache she rates 7/10. Denies fever/chills.  OB History     Gravida  5   Para  3   Term  2   Preterm  1   AB  1   Living  2      SAB  1   IAB  0   Ectopic      Multiple  1   Live Births  4           Past Medical History:  Diagnosis Date   Acute pancreatitis 12/30/2022   Alcohol abuse 12/30/2022   Alcohol use disorder    Anxiety    Anxiety and depression 12/30/2022   Bipolar disorder (manic depression) (HCC) 02/12/2022   Chronic post-traumatic stress disorder (PTSD) 10/04/2022   Depression    Phreesia 12/30/2019   Essential hypertension    Gestational diabetes mellitus (GDM)    Normal postpartum 2 hr GTT   History of depression 08/29/2018   History of gestational diabetes 10/03/2019   Iron  deficiency anemia 01/18/2023   Prediabetes 05/05/2021   Psychophysiological insomnia 05/05/2021   Severe episode of recurrent major depressive disorder, without psychotic features (HCC)    Suspected sleep apnea 01/18/2023   Vitamin D   deficiency 01/18/2023    Past Surgical History:  Procedure Laterality Date   BREAST SURGERY  2013   Lt & Rt excision juvenile fibroadenoma   CESAREAN SECTION  08/20/2023   MASS EXCISION  10/29/2011   Procedure: EXCISION MASS;  Surgeon: Vicenta DELENA Poli, MD;  Location: [facility];  Service: General;  Laterality: Bilateral;  excision of bilateral breast masses    Family History  Problem Relation Age of Onset   Diabetes Mother    Hypertension Mother    Miscarriages / [place] Mother    Other Father        doesn't know what he died from   Asthma Brother    Anxiety disorder Brother    Cancer Maternal Grandmother        lung cancer   Cancer Other        bladder cancer   ADD / ADHD Brother    Diabetes Maternal Uncle    ADD / ADHD Son    Asthma Son     Social History   Tobacco Use   Smoking status: Never    Passive exposure: Never   Smokeless tobacco: Never  Vaping Use   Vaping status: Never Used  Substance Use Topics  Alcohol use: Not Currently    Alcohol/week: 2.0 - 3.0 standard drinks of alcohol    Types: 2 - 3 Shots of liquor per week    Comment: occasional   Drug use: No    Allergies: No Known Allergies  Medications Prior to Admission  Medication Sig Dispense Refill Last Dose/Taking   acetaminophen  (TYLENOL ) 500 MG tablet Take 1,000 mg by mouth every 6 (six) hours as needed.      Blood Pressure Monitoring (BLOOD PRESSURE KIT) DEVI 1 each by Does not apply route as needed. (Patient not taking: Reported on 09/02/2023) 1 each 0    FLUoxetine  (PROZAC ) 20 MG capsule Take 1 capsule (20 mg total) by mouth daily. 30 capsule 1    Misc. Devices (GOJJI WEIGHT SCALE) MISC 1 Device by Does not apply route as needed. (Patient not taking: Reported on 09/02/2023) 1 each 0    QUEtiapine  (SEROQUEL ) 100 MG tablet Take 1 tablet (100 mg total) by mouth at bedtime. 30 tablet 1     Review of Systems  All other systems reviewed and are negative.  Physical Exam   Blood pressure (!)  151/85, pulse 90, temperature 98.4 F (36.9 C), temperature source Oral, resp. rate 18, height 5' 8 (1.727 m), weight 132 kg, last menstrual period 09/24/2023, SpO2 100%.  Physical Exam Vitals and nursing note reviewed.  Constitutional:      General: She is not in acute distress.    Appearance: She is well-developed. She is not ill-appearing.  HENT:     Head: Normocephalic and atraumatic.  Eyes:     General: No scleral icterus.       Right eye: No discharge.        Left eye: No discharge.     Conjunctiva/sclera: Conjunctivae normal.  Pulmonary:     Effort: Pulmonary effort is normal. No respiratory distress.  Abdominal:     Palpations: Abdomen is soft.     Tenderness: There is abdominal tenderness in the left upper quadrant.  Neurological:     General: No focal deficit present.     Mental Status: She is alert.  Psychiatric:        Mood and Affect: Mood normal.        Behavior: Behavior normal.     MAU Course  Procedures Results for orders placed or performed during the hospital encounter of 10/30/23 (from the past 24 hours)  Wet prep, genital     Status: Abnormal   Collection Time: 10/30/23  6:01 PM  Result Value Ref Range   Yeast Wet Prep HPF POC NONE SEEN NONE SEEN   Trich, Wet Prep NONE SEEN NONE SEEN   Clue Cells Wet Prep HPF POC PRESENT (A) NONE SEEN   WBC, Wet Prep HPF POC >=10 (A) <10   Sperm NONE SEEN   ABO/Rh     Status: None   Collection Time: 10/30/23  6:31 PM  Result Value Ref Range   ABO/RH(D)      B NEG Performed at [facility], [address], [place], [place] [address]   CBC     Status: Abnormal   Collection Time: 10/30/23  6:33 PM  Result Value Ref Range   WBC 6.2 4.0 - 10.5 K/uL   RBC 4.99 3.87 - 5.11 MIL/uL   Hemoglobin 8.8 (L) 12.0 - 15.0 g/dL   HCT 68.4 (L) 63.9 - 53.9 %   MCV 63.1 (L) 80.0 - 100.0 fL   MCH 17.6 (L) 26.0 - 34.0  pg   MCHC 27.9 (L) 30.0 - 36.0 g/dL   RDW 79.1 (H) 88.4 - 84.4 %   Platelets 333 150 - 400 K/uL   nRBC  0.0 0.0 - 0.2 %  hCG, quantitative, pregnancy     Status: Abnormal   Collection Time: 10/30/23  6:33 PM  Result Value Ref Range   hCG, Beta Chain, Quant, S 892 (H) <5 mIU/mL  Lipase, blood     Status: None   Collection Time: 10/30/23  6:33 PM  Result Value Ref Range   Lipase 31 11 - 51 U/L   US  OB LESS THAN 14 WEEKS WITH OB TRANSVAGINAL Result Date: 10/30/2023 CLINICAL DATA:  Abdominal pain, pregnant EXAM: OBSTETRIC <14 WK US  AND TRANSVAGINAL OB US  TECHNIQUE: Both transabdominal and transvaginal ultrasound examinations were performed for complete evaluation of the gestation as well as the maternal uterus, adnexal regions, and pelvic cul-de-sac. Transvaginal technique was performed to assess early pregnancy. COMPARISON:  None Available. FINDINGS: Intrauterine gestational sac: Single Yolk sac:  Not Visualized. Embryo:  Not Visualized. MSD: 3.6 mm   5 w   1 d Subchorionic hemorrhage:  None visualized. Maternal uterus/adnexae: No pelvic free fluid.  No adnexal masses. IMPRESSION: 1. Probable early intrauterine gestational sac, but no yolk sac, fetal pole, or cardiac activity yet visualized. Recommend follow-up quantitative B-HCG levels and follow-up US  in 14 days to assess viability. This recommendation follows SRU consensus guidelines: Diagnostic Criteria for Nonviable Pregnancy Early in the First Trimester. LOISE Diedra PARAS Med 2013; 630:8556-48. Electronically Signed   By: Ozell Daring M.D.   On: 10/30/2023 20:13    MDM  Ultrasound shows probably IUGS. No adnexal masses. Recommend HCG in 2-3 days followed by viability scan if normal  Epigastric pain GERD vs pancreatitis.  Nausea & headache resolved with IV fluids, reglan , benadryl , & pepcid. Labs pending. If normal will prescribe antiemetic & PPI.   Care turned over to [facility] CNM [place] 10/30/2023 9:00 PM   - Upon Reassessment @ 9:35 PM, symptoms significantly improved  - Normal Liver enzymes, low suspicion for Pancreatitis  - plan to  treat as GERD and discharge   Assessment and Plan   1. Gastroesophageal reflux disease, unspecified whether esophagitis present   2. Pain of upper abdomen   3. [redacted] weeks gestation of pregnancy   4. Nausea and vomiting, unspecified vomiting type   5. Intractable headache, unspecified chronicity pattern, unspecified headache type    - Reviewed that symptoms are likely related to GERD. Reviewed comfort measures and OTC options that are safe in pregnancy.  - Rx for Pepcid and Reglan  sent to outpatient pharmacy.  - Reviewed worsening signs and return precautions.  - Patient discharged home in stable condition and may return to MAU as needed.   Jens Siems Erven) Emilio, MSN, CNM  [facility]  10/30/2023 9:39 PM

## 2023-10-31 LAB — GC/CHLAMYDIA PROBE AMP (~~LOC~~) NOT AT ARMC
Chlamydia: NEGATIVE
Comment: NEGATIVE
Comment: NORMAL
Neisseria Gonorrhea: NEGATIVE
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
## 2023-11-08 ENCOUNTER — Other Ambulatory Visit: Payer: Self-pay

## 2023-11-08 DIAGNOSIS — Z3490 Encounter for supervision of normal pregnancy, unspecified, unspecified trimester: Secondary | ICD-10-CM

## 2023-11-08 DIAGNOSIS — Z3687 Encounter for antenatal screening for uncertain dates: Secondary | ICD-10-CM

## 2023-11-09 ENCOUNTER — Other Ambulatory Visit: Payer: Self-pay | Admitting: *Deleted

## 2023-11-09 ENCOUNTER — Other Ambulatory Visit

## 2023-11-09 ENCOUNTER — Other Ambulatory Visit: Payer: Self-pay | Admitting: Certified Nurse Midwife

## 2023-11-09 ENCOUNTER — Other Ambulatory Visit: Payer: Self-pay

## 2023-11-09 DIAGNOSIS — O283 Abnormal ultrasonic finding on antenatal screening of mother: Secondary | ICD-10-CM

## 2023-11-09 DIAGNOSIS — Z3490 Encounter for supervision of normal pregnancy, unspecified, unspecified trimester: Secondary | ICD-10-CM

## 2023-11-09 DIAGNOSIS — Z3A01 Less than 8 weeks gestation of pregnancy: Secondary | ICD-10-CM

## 2023-11-09 DIAGNOSIS — Z3687 Encounter for antenatal screening for uncertain dates: Secondary | ICD-10-CM

## 2023-11-09 DIAGNOSIS — Z3491 Encounter for supervision of normal pregnancy, unspecified, first trimester: Secondary | ICD-10-CM | POA: Diagnosis not present

## 2023-11-15 ENCOUNTER — Ambulatory Visit: Admitting: Family Medicine

## 2023-11-16 ENCOUNTER — Telehealth (HOSPITAL_COMMUNITY): Payer: Self-pay

## 2023-11-16 NOTE — Telephone Encounter (Signed)
 [name] calls and leaves this therapist a message that she needs to reschedule her appt that is scheduled for 11-24-23 at 2pm. She says she needs an earlier appt as she has to pick her kids up from school at that time.  This therapist returns the call, however reaches voice mail and leaves a HIPAA compliant message requesting a return call.  [name], MS, LMFT, LCAS

## 2023-11-17 ENCOUNTER — Encounter (HOSPITAL_COMMUNITY)

## 2023-11-19 ENCOUNTER — Inpatient Hospital Stay (HOSPITAL_COMMUNITY)

## 2023-11-19 ENCOUNTER — Inpatient Hospital Stay (HOSPITAL_COMMUNITY)
Admission: AD | Admit: 2023-11-19 | Discharge: 2023-11-19 | Disposition: A | Discharge status: Home / Self Care | Attending: Obstetrics and Gynecology | Admitting: Obstetrics and Gynecology

## 2023-11-19 DIAGNOSIS — Z3A08 8 weeks gestation of pregnancy: Secondary | ICD-10-CM

## 2023-11-19 DIAGNOSIS — O26899 Other specified pregnancy related conditions, unspecified trimester: Secondary | ICD-10-CM

## 2023-11-19 DIAGNOSIS — O209 Hemorrhage in early pregnancy, unspecified: Secondary | ICD-10-CM | POA: Diagnosis present

## 2023-11-19 DIAGNOSIS — R109 Unspecified abdominal pain: Secondary | ICD-10-CM | POA: Diagnosis not present

## 2023-11-19 DIAGNOSIS — Z3A01 Less than 8 weeks gestation of pregnancy: Secondary | ICD-10-CM | POA: Diagnosis not present

## 2023-11-19 DIAGNOSIS — O26891 Other specified pregnancy related conditions, first trimester: Secondary | ICD-10-CM

## 2023-11-19 DIAGNOSIS — Z3491 Encounter for supervision of normal pregnancy, unspecified, first trimester: Secondary | ICD-10-CM

## 2023-11-19 DIAGNOSIS — Z3490 Encounter for supervision of normal pregnancy, unspecified, unspecified trimester: Secondary | ICD-10-CM

## 2023-11-19 LAB — URINALYSIS, ROUTINE W REFLEX MICROSCOPIC
Bilirubin Urine: NEGATIVE
Glucose, UA: NEGATIVE mg/dL
Hgb urine dipstick: NEGATIVE
Ketones, ur: NEGATIVE mg/dL
Leukocytes,Ua: NEGATIVE
Nitrite: NEGATIVE
Protein, ur: NEGATIVE mg/dL
Specific Gravity, Urine: 1.016 (ref 1.005–1.030)
pH: 7 (ref 5.0–8.0)

## 2023-11-19 LAB — WET PREP, GENITAL
Clue Cells Wet Prep HPF POC: NONE SEEN
Sperm: NONE SEEN
Trich, Wet Prep: NONE SEEN
WBC, Wet Prep HPF POC: >=10 — AB (ref <10)
Yeast Wet Prep HPF POC: NONE SEEN

## 2023-11-19 NOTE — MAU Provider Note (Signed)
 S [name] is a 28 y.o. [phone] patient who presents to MAU today with complaint of vaginal bleeding that started at approximately 0030 when she wipes.  She states she has also started to feel cramping that started around the same time as the bleeding and denies taking any pain medication for this.  The patient patient is [redacted] weeks pregnant.  She was seen and evaluated on 10/30/2023 for similar complaints and an ultrasound confirmed a probably early gestational sac in the uterus and she has a follow-up ultrasound for viability scheduled on 11/21/2023.   O BP 138/75 (BP Location: Right Arm)   Pulse 93   Temp 99.1 F (37.3 C) (Oral)   Resp 12   Ht 5' 8 (1.727 m)   Wt 135.7 kg   LMP 09/24/2023 (Exact Date)   BMI 45.48 kg/m  Physical Exam Vitals and nursing note reviewed.  Constitutional:      General: She is not in acute distress.    Appearance: Normal appearance. She is obese. She is not ill-appearing.  HENT:     Head: Normocephalic.     Nose: Nose normal.  Cardiovascular:     Rate and Rhythm: Normal rate.  Pulmonary:     Effort: Pulmonary effort is normal.  Abdominal:     Palpations: Abdomen is soft.     Tenderness: There is no abdominal tenderness. There is no guarding.  Musculoskeletal:        General: Normal range of motion.     Cervical back: Normal range of motion.  Skin:    General: Skin is warm.  Neurological:     Mental Status: She is oriented to person, place, and time.  Psychiatric:        Behavior: Behavior normal.    MDM  HIGH  Vaginal bleeding/ abdominal cramping in early pregnancy Prior work up was performed on 10/30/23 to r/o ectopic pregnancy  Vaginal swabs: Wet prep negative, GC pending at discharge UA : No evidence of UTI   OB Ultrasound today ( Ultrasound images were not able to transfer in epic for me to review images. A verbal report from the sonographer was given to me.  GS, YS, and FP was visualized with low fetal heart rate at  100 bpm  Final report from radiologist to follow.  A Follow up ultrasound is scheduled for viability on 11/21/23   Differential diagnosis considered for 1st trimester vaginal bleeding includes but is not limited to: ectopic pregnancy, complete spontaneous abortion, incomplete abortion, missed abortion, threatened abortion, embryonic/fetal demise, cervical insufficiency, cervical or vaginal disorder    Orders Placed This Encounter  Procedures   Wet prep, genital    Standing Status:   Standing    Number of Occurrences:   1   US  OB Transvaginal    Standing Status:   Standing    Number of Occurrences:   1    Symptom/Reason for Exam:   Vaginal bleeding affecting early pregnancy [[id number]]   Urinalysis, Routine w reflex microscopic -Urine, Clean Catch    Standing Status:   Standing    Number of Occurrences:   1    Specimen Source:   Urine, Clean Catch [[id number]]      Results for orders placed or performed during the hospital encounter of 11/19/23 (from the past 24 hours)  Wet prep, genital     Status: Abnormal   Collection Time: 11/19/23  2:37 AM   Specimen: Urine, Clean Catch  Result Value Ref Range  Yeast Wet Prep HPF POC NONE SEEN NONE SEEN   Trich, Wet Prep NONE SEEN NONE SEEN   Clue Cells Wet Prep HPF POC NONE SEEN NONE SEEN   WBC, Wet Prep HPF POC >=10 (A) <10   Sperm NONE SEEN   Urinalysis, Routine w reflex microscopic -Urine, Clean Catch     Status: None   Collection Time: 11/19/23  2:44 AM  Result Value Ref Range   Color, Urine YELLOW YELLOW   APPearance CLEAR CLEAR   Specific Gravity, Urine 1.016 1.005 - 1.030   pH 7.0 5.0 - 8.0   Glucose, UA NEGATIVE NEGATIVE mg/dL   Hgb urine dipstick NEGATIVE NEGATIVE   Bilirubin Urine NEGATIVE NEGATIVE   Ketones, ur NEGATIVE NEGATIVE mg/dL   Protein, ur NEGATIVE NEGATIVE mg/dL   Nitrite NEGATIVE NEGATIVE   Leukocytes,Ua NEGATIVE NEGATIVE       I have reviewed the patient chart and performed the physical exam . I have ordered &  interpreted the lab results and reviewed them with the patient  A/P as described below.  Counseling and education provided and patient agreeable  with plan as described below. Verbalized understanding.    ASSESSMENT Medical screening exam complete  Vaginal bleeding affecting early pregnancy  Abdominal cramping affecting pregnancy  Early stage of pregnancy  Normal intrauterine pregnancy on prenatal ultrasound in first trimester    PLAN Future Appointments  Date Time Provider Department Center  11/21/2023  1:15 PM [facility] US2 [facility]  12/02/2023  9:30 AM Kapoor, Sahil, MD [facility] None    Discharge from MAU in stable condition  See AVS for full description of educational information and instructions provided to the patient at time of discharge  Warning signs for worsening condition that would warrant emergency follow-up discussed Patient may return to MAU as needed   Littie Olam LABOR, NP 11/19/2023 5:09 AM   This chart was dictated using voice recognition software, Dragon. Despite the best efforts of this provider to proofread and correct errors, errors may still occur which can change documentation meaning.

## 2023-11-19 NOTE — MAU Note (Signed)
 Pt says she has VB- started  at 0030- red ,when she wipes.  Feels cramping - started at 0030 - 9/10-  no pain meds. Now in Triage- 6/10  Last sex- 3 weeks ago

## 2023-11-21 ENCOUNTER — Other Ambulatory Visit

## 2023-11-21 ENCOUNTER — Other Ambulatory Visit: Payer: Self-pay

## 2023-11-21 ENCOUNTER — Inpatient Hospital Stay (HOSPITAL_COMMUNITY)
Admission: AD | Admit: 2023-11-21 | Discharge: 2023-11-21 | Disposition: A | Discharge status: Home / Self Care | Payer: Self-pay | Attending: Obstetrics & Gynecology | Admitting: Obstetrics & Gynecology

## 2023-11-21 ENCOUNTER — Telehealth: Payer: Self-pay | Admitting: *Deleted

## 2023-11-21 DIAGNOSIS — O283 Abnormal ultrasonic finding on antenatal screening of mother: Secondary | ICD-10-CM | POA: Diagnosis not present

## 2023-11-21 DIAGNOSIS — Z3A08 8 weeks gestation of pregnancy: Secondary | ICD-10-CM | POA: Diagnosis not present

## 2023-11-21 DIAGNOSIS — O209 Hemorrhage in early pregnancy, unspecified: Secondary | ICD-10-CM

## 2023-11-21 LAB — GC/CHLAMYDIA PROBE AMP (~~LOC~~) NOT AT ARMC
Chlamydia: NEGATIVE
Comment: NEGATIVE
Comment: NORMAL
Neisseria Gonorrhea: NEGATIVE

## 2023-11-21 NOTE — MAU Provider Note (Addendum)
 None     S [name] is a 28 y.o. [phone] female at [redacted]w[redacted]d who presents to MAU today with complaint of vaginal bleeding.  She was evaluated in the MAU for the same 2 days ago on 11/19/2023.  Workup at that time significant for ultrasound with IUP but fetal bradycardia with fetal heart rate 100 bpm.  Recommendation was to repeat ultrasound in 14 days.  She called the office today noting that she had bleeding over the weekend was still experiencing moderate bleeding, and the nurse recommended that she present to MAU for evaluation. Patient denies any abdominal pain or fever.  Pertinent items noted in HPI and remainder of comprehensive ROS otherwise negative.   O BP (!) 143/85 (BP Location: Right Arm)   Pulse 92   Temp 99 F (37.2 C)   Resp 19   Ht 5' 8 (1.727 m)   Wt 134.7 kg   LMP 09/24/2023 (Exact Date)   SpO2 100%   BMI 45.14 kg/m  Physical Exam Vitals reviewed.  Constitutional:      General: She is not in acute distress.    Appearance: She is well-developed. She is not diaphoretic.  Eyes:     General: No scleral icterus. Pulmonary:     Effort: Pulmonary effort is normal. No respiratory distress.  Skin:    General: Skin is warm and dry.  Neurological:     Mental Status: She is alert.     Coordination: Coordination normal.    No results found for this or any previous visit (from the past 24 hours).  MDM: Low MAU Course: -Mildly elevated BP, patient has chronic HTN. Not taking medication at this time. -Discussed with Dr. Ozan, confirmed too early for repeat US . -Provided reassurance regarding expectations for symptoms. Reviewed plan for repeat US  in 14 days.  A 1. Vaginal bleeding affecting early pregnancy (Primary) - Discharge patient  2. Abnormal antenatal ultrasound - Discharge patient  3. [redacted] weeks gestation of pregnancy - Discharge patient  Medical screening exam complete  P Discharge from MAU in stable condition with return precautions Follow  up at [facility] as scheduled for repeat US , message sent to office to schedule  Future Appointments  Date Time Provider Department Center  12/02/2023  9:30 AM Katrina Stewart, Sahil, MD [facility] None   Allergies as of 11/21/2023   No Known Allergies      Medication List     TAKE these medications    acetaminophen  500 MG tablet Commonly known as: TYLENOL  Take 1,000 mg by mouth every 6 (six) hours as needed.   Blood Pressure Kit Devi 1 each by Does not apply route as needed.   famotidine 20 MG tablet Commonly known as: PEPCID Take 1 tablet (20 mg total) by mouth 2 (two) times daily.   FLUoxetine  20 MG capsule Commonly known as: PROZAC  Take 1 capsule (20 mg total) by mouth daily.   Gojji Weight Scale Misc 1 Device by Does not apply route as needed.   metoCLOPramide  10 MG tablet Commonly known as: REGLAN  Take 1 tablet (10 mg total) by mouth every 6 (six) hours.   metroNIDAZOLE  500 MG tablet Commonly known as: FLAGYL  Take 1 tablet (500 mg total) by mouth 2 (two) times daily.   QUEtiapine  100 MG tablet Commonly known as: SEROquel  Take 1 tablet (100 mg total) by mouth at bedtime.        Katrina DELENA Sear, PA

## 2023-11-21 NOTE — Discharge Instructions (Addendum)
 I am so sorry that this is happening. You may experience many different emotions while you grieve, and it is normal to have emotional stress after a pregnancy loss. Emotional recovery can take longer than physical recovery. There is nothing that you did or did not do to cause this to happen; it is not your fault. Please reach out if you need support, we are here for you. You can also join a group to help work through your grief: [email or web address]  EARLY PREGNANCY LOSS Once the egg is fertilized with the sperm and begins to develop, it attaches to the lining of the uterus. This early pregnancy tissue may not develop into an embryo (the beginning stage of a baby). Sometimes an embryo does develop but does not continue to grow. These problems can be seen on ultrasound.  About 1 in 4 women will have a pregnancy loss in her lifetime.  One in five pregnancies is found to be an early pregnancy failure.    WHAT TO EXPECT AS THE PREGNANCY PASSES: Cramping   Bleeding Headaches   Dizziness You can take ibuprofen  for most pain, but use the oxycodone  if needed for severe pain not controlled with ibuprofen . You may take the Reglan  you have for any nausea. You can also take Tylenol  for pain if you prefer.  AFTER THE PREGNANCY PASSES: Everybody will feel differently after the early pregnancy passes.  You may have soreness or cramps for a day or two.  You may have soreness or cramps for day or two.   You may have light bleeding for up to 2 weeks.  You may be as active as you feel like being. You should have a follow-up exam with your OBGYN in about 2 weeks to ensure you are healing properly. Your next period is expected to start again about 4-6 weeks afterward. It is possible to get pregnant soon after a loss.  Reasons to return to MAU at [facility]: If you have heavier bleeding that soaks through more that 2 full-size pads per hour for an hour or more If you bleed so much that you feel like you might pass out or you do pass out If you feel lightheaded or dizzy If you have significant abdominal pain that is not improved with Tylenol  1000 mg every 8 hours as needed for pain If you develop a fever > 100.5  If you have foul-smelling vaginal discharge

## 2023-11-21 NOTE — Telephone Encounter (Signed)
 Notified by sonographer patient has US  11/19/23 . Reviewed chart with Dr. Cresenzo. He reccommends repeat US  in 14 days from last US  or if she is bleeding like a period to go back to the hospital for evaluation for a possible miscarriage. I called Katrina Stewart and informed her we were cancelling todays US  since she had one 2 days ago. She reports she had heavy bleeding yesterday with clots and still bleeding today and said  I think I am having a miscarriage. I advised her to go to MAU for evaluation for a miscarriage and explained that is the best place for her to be evaluated in this situation. She voices understanding. Rock Skip PEAK

## 2023-11-21 NOTE — MAU Note (Signed)
 Katrina Stewart is a 28 y.o. at [redacted]w[redacted]d here in MAU reporting: she had heavy VB with clots over the weekend and thinks she may have had a miscarriage.  Denies current pain, but had cramping over the weekend.  States she still has VB but it's moderate.  LMP: 09/24/2023 Onset of complaint: Saturday Pain score: 0 Vitals:   11/21/23 1303  BP: (!) 143/85  Pulse: 92  Resp: 19  Temp: 99 F (37.2 C)  SpO2: 100%     FHT: NA  Lab orders placed from triage: None
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
## 2023-11-23 ENCOUNTER — Ambulatory Visit: Payer: Self-pay | Admitting: Obstetrics and Gynecology

## 2023-11-24 ENCOUNTER — Ambulatory Visit (HOSPITAL_COMMUNITY)

## 2023-11-25 ENCOUNTER — Telehealth (HOSPITAL_COMMUNITY): Payer: Self-pay

## 2023-11-25 NOTE — Telephone Encounter (Signed)
 [name] calls and asks to reschedule with this therapist. Therapist offers first available appt on Tuesday,  December 20, 2023 at 1pm and she accepts. Therapist messages front desk to ask for this to be scheduled  [name], MS, LMFT, LCAS

## 2023-12-02 ENCOUNTER — Encounter (HOSPITAL_COMMUNITY): Payer: Self-pay

## 2023-12-02 ENCOUNTER — Telehealth (HOSPITAL_COMMUNITY)

## 2023-12-02 DIAGNOSIS — G4700 Insomnia, unspecified: Secondary | ICD-10-CM

## 2023-12-02 DIAGNOSIS — F3160 Bipolar disorder, current episode mixed, unspecified: Secondary | ICD-10-CM | POA: Diagnosis not present

## 2023-12-02 DIAGNOSIS — F4312 Post-traumatic stress disorder, chronic: Secondary | ICD-10-CM | POA: Diagnosis not present

## 2023-12-02 DIAGNOSIS — Z3A00 Weeks of gestation of pregnancy not specified: Secondary | ICD-10-CM

## 2023-12-02 DIAGNOSIS — F411 Generalized anxiety disorder: Secondary | ICD-10-CM | POA: Diagnosis not present

## 2023-12-02 DIAGNOSIS — E559 Vitamin D deficiency, unspecified: Secondary | ICD-10-CM

## 2023-12-02 DIAGNOSIS — F1020 Alcohol dependence, uncomplicated: Secondary | ICD-10-CM

## 2023-12-02 MED ORDER — FLUOXETINE HCL 10 MG PO CAPS: 10.0000 mg | ORAL_CAPSULE | Freq: Every day | ORAL | 1 refills | Status: DC

## 2023-12-02 MED ORDER — QUETIAPINE FUMARATE 100 MG PO TABS: 100.0000 mg | ORAL_TABLET | Freq: Every day | ORAL | 1 refills | Status: DC

## 2023-12-02 MED ORDER — FLUOXETINE HCL 20 MG PO CAPS: 20.0000 mg | ORAL_CAPSULE | Freq: Every day | ORAL | 1 refills | Status: DC

## 2023-12-02 NOTE — Addendum Note (Signed)
 Addended by: Ginger Leeth on: 12/02/2023 10:06 AM   Modules accepted: Level of Service

## 2023-12-05 ENCOUNTER — Other Ambulatory Visit: Payer: Self-pay

## 2023-12-05 ENCOUNTER — Ambulatory Visit: Admitting: Obstetrics and Gynecology

## 2023-12-05 ENCOUNTER — Ambulatory Visit

## 2023-12-05 VITALS — BP 136/84 | HR 89

## 2023-12-05 DIAGNOSIS — O3680X0 Pregnancy with inconclusive fetal viability, not applicable or unspecified: Secondary | ICD-10-CM | POA: Diagnosis not present

## 2023-12-05 DIAGNOSIS — Z3A00 Weeks of gestation of pregnancy not specified: Secondary | ICD-10-CM | POA: Diagnosis not present

## 2023-12-05 DIAGNOSIS — O209 Hemorrhage in early pregnancy, unspecified: Secondary | ICD-10-CM

## 2023-12-05 DIAGNOSIS — O283 Abnormal ultrasonic finding on antenatal screening of mother: Secondary | ICD-10-CM

## 2023-12-05 NOTE — Progress Notes (Addendum)
 Patient is here today for follow up US  from 11/19/23. Patient was also seen in the MAU on 11/21/23 for vaginal bleeding but was too early for repeat US  during her MAU visit.  Patient reports she had vaginal bleeding and passing clots for approximately one week after her US  appointment on 11/19/23. Today, patient denies any vaginal bleeding or abdominal pain today. Reports that bleeding stopped approximately 1-1.5 weeks ago.  Physician reviewed results on US  report, no retained products noted. No GS with FP no longer seen. Per physician, advised routine HCG lab today and follow up lab weekly if indicated. Patient notified and reviewed MAU precaution with patient.    Rosaline, RN  Patient was assessed and managed by nursing staff during this encounter. I have reviewed the chart and agree with the documentation and plan. I have also made any necessary editorial changes. Ultrasound report reviewed with no POC seen, recommend serial bhcg until normal  Jerilynn DELENA Buddle, MD 12/05/2023 11:58 AM

## 2023-12-06 LAB — BETA HCG QUANT (REF LAB): hCG Quant: <1 m[IU]/mL

## 2023-12-07 ENCOUNTER — Ambulatory Visit: Payer: Self-pay | Admitting: Obstetrics and Gynecology

## 2023-12-14 ENCOUNTER — Encounter: Payer: Self-pay | Admitting: Family Medicine

## 2023-12-14 ENCOUNTER — Ambulatory Visit: Admitting: Family Medicine

## 2023-12-14 ENCOUNTER — Other Ambulatory Visit (HOSPITAL_COMMUNITY)
Admission: RE | Admit: 2023-12-14 | Discharge: 2023-12-14 | Disposition: A | Discharge status: Home / Self Care | Source: Ambulatory Visit | Attending: Family Medicine | Admitting: Family Medicine

## 2023-12-14 VITALS — BP 128/80 | HR 76 | Ht 68.0 in | Wt 288.4 lb

## 2023-12-14 DIAGNOSIS — E66813 Obesity, class 3: Secondary | ICD-10-CM

## 2023-12-14 DIAGNOSIS — Z23 Encounter for immunization: Secondary | ICD-10-CM

## 2023-12-14 DIAGNOSIS — D649 Anemia, unspecified: Secondary | ICD-10-CM | POA: Diagnosis not present

## 2023-12-14 DIAGNOSIS — Z7689 Persons encountering health services in other specified circumstances: Secondary | ICD-10-CM

## 2023-12-14 DIAGNOSIS — Z6841 Body Mass Index (BMI) 40.0 and over, adult: Secondary | ICD-10-CM

## 2023-12-14 DIAGNOSIS — R5383 Other fatigue: Secondary | ICD-10-CM

## 2023-12-14 DIAGNOSIS — Z113 Encounter for screening for infections with a predominantly sexual mode of transmission: Secondary | ICD-10-CM | POA: Diagnosis present

## 2023-12-14 MED ORDER — VITAMIN D (ERGOCALCIFEROL) 1.25 MG (50000 UNIT) PO CAPS: 50000.0000 [IU] | ORAL_CAPSULE | ORAL | 0 refills | Status: AC

## 2023-12-15 ENCOUNTER — Telehealth: Payer: Self-pay

## 2023-12-15 LAB — CBC WITH DIFFERENTIAL/PLATELET
Basophils Absolute: 0.1 x10E3/uL (ref 0.0–0.2)
Basos: 1 %
EOS (ABSOLUTE): 0.2 x10E3/uL (ref 0.0–0.4)
Eos: 3 %
Hematocrit: 34.1 % (ref 34.0–46.6)
Hemoglobin: 9 g/dL — ABNORMAL LOW (ref 11.1–15.9)
Immature Grans (Abs): 0 x10E3/uL (ref 0.0–0.1)
Immature Granulocytes: 0 %
Lymphocytes Absolute: 2.2 x10E3/uL (ref 0.7–3.1)
Lymphs: 36 %
MCH: 16.4 pg — ABNORMAL LOW (ref 26.6–33.0)
MCHC: 26.4 g/dL — ABNORMAL LOW (ref 31.5–35.7)
MCV: 62 fL — ABNORMAL LOW (ref 79–97)
Monocytes Absolute: 0.5 x10E3/uL (ref 0.1–0.9)
Monocytes: 8 %
Neutrophils Absolute: 3.2 x10E3/uL (ref 1.4–7.0)
Neutrophils: 52 %
Platelets: 597 x10E3/uL — ABNORMAL HIGH (ref 150–450)
RBC: 5.48 x10E6/uL — ABNORMAL HIGH (ref 3.77–5.28)
RDW: 20.2 % — ABNORMAL HIGH (ref 11.7–15.4)
WBC: 6.1 x10E3/uL (ref 3.4–10.8)

## 2023-12-15 LAB — IRON,TIBC AND FERRITIN PANEL
Ferritin: 6 ng/mL — ABNORMAL LOW (ref 15–150)
Iron Saturation: 5 % — CL (ref 15–55)
Iron: 23 ug/dL — ABNORMAL LOW (ref 27–159)
Total Iron Binding Capacity: 486 ug/dL — ABNORMAL HIGH (ref 250–450)
UIBC: 463 ug/dL — ABNORMAL HIGH (ref 131–425)

## 2023-12-15 LAB — CERVICOVAGINAL ANCILLARY ONLY
Bacterial Vaginitis (gardnerella): POSITIVE — AB
Candida Glabrata: NEGATIVE
Candida Vaginitis: POSITIVE — AB
Chlamydia: NEGATIVE
Comment: NEGATIVE
Comment: NEGATIVE
Comment: NEGATIVE
Comment: NEGATIVE
Comment: NEGATIVE
Comment: NORMAL
Neisseria Gonorrhea: NEGATIVE
Trichomonas: NEGATIVE

## 2023-12-15 MED ORDER — PHENTERMINE HCL 37.5 MG PO TABS: 37.5000 mg | ORAL_TABLET | Freq: Every day | ORAL | 0 refills | Status: DC

## 2023-12-15 NOTE — Telephone Encounter (Signed)
 Spoke with patient advise vit D is ok to take with phentermine  (ADIPEX-P ) 37.5 MG tablet

## 2023-12-15 NOTE — Telephone Encounter (Signed)
 Copied from CRM [id number]. Topic: Clinical - Medication Question >> Dec 15, 2023  8:11 AM Robinson H wrote: Reason for CRM: Patient calling to see if the phentermine  (ADIPEX-P ) 37.5 MG tablet was sent to the pharmacy, advised patient medication was sent to the [facility] on file today at 7:18 am. Patient wants to know if she should take the Vitamin D , Ergocalciferol , (DRISDOL ) 1.25 MG (50000 UNIT) CAPS capsule as well as the phentermine  (ADIPEX-P ) 37.5 MG tablet.  Tanaya [phone]

## 2023-12-19 ENCOUNTER — Ambulatory Visit: Payer: Self-pay | Admitting: Family Medicine

## 2023-12-19 ENCOUNTER — Encounter: Payer: Self-pay | Admitting: Family Medicine

## 2023-12-19 MED ORDER — METRONIDAZOLE 500 MG PO TABS: 500.0000 mg | ORAL_TABLET | Freq: Two times a day (BID) | ORAL | 0 refills | Status: AC

## 2023-12-19 MED ORDER — FLUCONAZOLE 150 MG PO TABS: 150.0000 mg | ORAL_TABLET | Freq: Once | ORAL | 0 refills | Status: AC

## 2023-12-19 NOTE — Progress Notes (Signed)
 Established Patient Office Visit  Subjective    Patient ID: Katrina Stewart, female    DOB: 1995-11-08  Age: 28 y.o. MRN: [id number]  CC:  Chief Complaint  Patient presents with   Medical Management of Chronic Issues    Pt interested in getting her iron  and vitamin D  checked. Also interested in STI testing. Pt would like flu shot today as well    HPI Katrina Stewart presents for routine weight management as well as STI screen. Patient also reports vitamin D  and iron  studies follow up.   Outpatient Encounter Medications as of 12/14/2023  Medication Sig   FLUoxetine  (PROZAC ) 10 MG capsule Take 1 capsule (10 mg total) by mouth daily.   FLUoxetine  (PROZAC ) 20 MG capsule Take 1 capsule (20 mg total) by mouth daily.   phentermine  (ADIPEX-P ) 37.5 MG tablet Take 1 tablet (37.5 mg total) by mouth daily before breakfast.   QUEtiapine  (SEROQUEL ) 100 MG tablet Take 1 tablet (100 mg total) by mouth at bedtime.   Vitamin D , Ergocalciferol , (DRISDOL ) 1.25 MG (50000 UNIT) CAPS capsule Take 1 capsule (50,000 Units total) by mouth every 7 (seven) days.   acetaminophen  (TYLENOL ) 500 MG tablet Take 1,000 mg by mouth every 6 (six) hours as needed. (Patient not taking: Reported on 12/14/2023)   Blood Pressure Monitoring (BLOOD PRESSURE KIT) DEVI 1 each by Does not apply route as needed. (Patient not taking: Reported on 09/02/2023)   famotidine (PEPCID) 20 MG tablet Take 1 tablet (20 mg total) by mouth 2 (two) times daily. (Patient not taking: Reported on 12/05/2023)   metoCLOPramide  (REGLAN ) 10 MG tablet Take 1 tablet (10 mg total) by mouth every 6 (six) hours. (Patient not taking: Reported on 12/05/2023)   metroNIDAZOLE  (FLAGYL ) 500 MG tablet Take 1 tablet (500 mg total) by mouth 2 (two) times daily. (Patient not taking: Reported on 12/05/2023)   Misc. Devices (GOJJI WEIGHT SCALE) MISC 1 Device by Does not apply route as needed. (Patient not taking: Reported on 09/02/2023)   No facility-administered  encounter medications on file as of 12/14/2023.    Past Medical History:  Diagnosis Date   Acute pancreatitis 12/30/2022   Alcohol abuse 12/30/2022   Alcohol use disorder    Anxiety    Anxiety and depression 12/30/2022   Bipolar disorder (manic depression) (HCC) 02/12/2022   Chronic post-traumatic stress disorder (PTSD) 10/04/2022   Depression    Phreesia 12/30/2019   Essential hypertension    Gestational diabetes mellitus (GDM)    Normal postpartum 2 hr GTT   History of depression 08/29/2018   History of gestational diabetes 10/03/2019   Iron  deficiency anemia 01/18/2023   Prediabetes 05/05/2021   Psychophysiological insomnia 05/05/2021   Severe episode of recurrent major depressive disorder, without psychotic features (HCC)    Suspected sleep apnea 01/18/2023   Vitamin D  deficiency 01/18/2023    Past Surgical History:  Procedure Laterality Date   BREAST SURGERY  2013   Lt & Rt excision juvenile fibroadenoma   CESAREAN SECTION  08/20/2023   MASS EXCISION  10/29/2011   Procedure: EXCISION MASS;  Surgeon: Vicenta DELENA Poli, MD;  Location: [facility];  Service: General;  Laterality: Bilateral;  excision of bilateral breast masses    Family History  Problem Relation Age of Onset   Diabetes Mother    Hypertension Mother    Miscarriages / Stillbirths Mother    Other Father        doesn't know what he died from   Asthma Brother  Anxiety disorder Brother    Cancer Maternal Grandmother        lung cancer   Cancer Other        bladder cancer   ADD / ADHD Brother    Diabetes Maternal Uncle    ADD / ADHD Son    Asthma Son     Social History   Socioeconomic History   Marital status: Single    Spouse name: Not on file   Number of children: Not on file   Years of education: Not on file   Highest education level: Some college, no degree  Occupational History    Comment: unemployed  Tobacco Use   Smoking status: Never    Passive exposure: Never   Smokeless tobacco:  Never  Vaping Use   Vaping status: Never Used  Substance and Sexual Activity   Alcohol use: Not Currently    Alcohol/week: 2.0 - 3.0 standard drinks of alcohol    Types: 2 - 3 Shots of liquor per week    Comment: occasional   Drug use: No   Sexual activity: Not Currently    Birth control/protection: None  Other Topics Concern   Not on file  Social History Narrative   Not on file   Social Drivers of Health   Financial Resource Strain: Low Risk  (12/13/2023)   Overall Financial Resource Strain (CARDIA)    Difficulty of Paying Living Expenses: Not hard at all  Food Insecurity: No Food Insecurity (12/13/2023)   Hunger Vital Sign    Worried About Running Out of Food in the Last Year: Never true    Ran Out of Food in the Last Year: Never true  Transportation Needs: No Transportation Needs (12/13/2023)   PRAPARE - Administrator, Civil Service (Medical): No    Lack of Transportation (Non-Medical): No  Physical Activity: Insufficiently Active (12/13/2023)   Exercise Vital Sign    Days of Exercise per Week: 7 days    Minutes of Exercise per Session: 20 min  Stress: Stress Concern Present (12/13/2023)   [employer] of Occupational Health - Occupational Stress Questionnaire    Feeling of Stress: Very much  Social Connections: Moderately Isolated (12/13/2023)   Social Connection and Isolation Panel    Frequency of Communication with Friends and Family: Twice a week    Frequency of Social Gatherings with Friends and Family: More than three times a week    Attends Religious Services: 1 to 4 times per year    Active Member of [employer] or Organizations: No    Attends Engineer, Structural: Not on file    Marital Status: Never married  Intimate Partner Violence: Not At Risk (08/31/2023)   Humiliation, Afraid, Rape, and Kick questionnaire    Fear of Current or Ex-Partner: No    Emotionally Abused: No    Physically Abused: No    Sexually Abused: No    Review of  Systems  All other systems reviewed and are negative.       Objective    BP 128/80   Pulse 76   Ht 5' 8 (1.727 m)   Wt 288 lb 6.4 oz (130.8 kg)   LMP 09/24/2023 (Exact Date)   SpO2 97%   BMI 43.85 kg/m   Physical Exam Vitals and nursing note reviewed.  Constitutional:      General: She is not in acute distress.    Appearance: She is obese.  Cardiovascular:     Rate and Rhythm: Normal  rate and regular rhythm.  Pulmonary:     Effort: Pulmonary effort is normal.     Breath sounds: Normal breath sounds.  Abdominal:     Palpations: Abdomen is soft.     Tenderness: There is no abdominal tenderness.  Skin:    Comments: Well healing surgical scar noted.   Neurological:     General: No focal deficit present.     Mental Status: She is alert and oriented to person, place, and time.         Assessment & Plan:   Encounter for weight management  Class 3 severe obesity due to excess calories without serious comorbidity with body mass index (BMI) of 40.0 to 44.9 in adult [facility])  Screen for STD (sexually transmitted disease) -     Cervicovaginal ancillary only  Anemia, unspecified type -     Iron , TIBC and Ferritin Panel -     CBC with Differential/Platelet  Other fatigue -     Vitamin D  (Ergocalciferol ); Take 1 capsule (50,000 Units total) by mouth every 7 (seven) days.  Dispense: 12 capsule; Refill: 0  Encounter for immunization -     Flu vaccine trivalent PF, 6mos and older(Flulaval,Afluria,Fluarix,Fluzone)  Other orders -     Phentermine  HCl; Take 1 tablet (37.5 mg total) by mouth daily before breakfast.  Dispense: 30 tablet; Refill: 0     Return in about 4 weeks (around 01/11/2024) for follow up, weight management.   Tanda Raguel SQUIBB, MD

## 2023-12-20 ENCOUNTER — Ambulatory Visit (INDEPENDENT_AMBULATORY_CARE_PROVIDER_SITE_OTHER)

## 2023-12-20 DIAGNOSIS — F4312 Post-traumatic stress disorder, chronic: Secondary | ICD-10-CM

## 2023-12-20 DIAGNOSIS — F3160 Bipolar disorder, current episode mixed, unspecified: Secondary | ICD-10-CM

## 2023-12-20 DIAGNOSIS — F1021 Alcohol dependence, in remission: Secondary | ICD-10-CM

## 2023-12-20 DIAGNOSIS — F411 Generalized anxiety disorder: Secondary | ICD-10-CM

## 2023-12-20 NOTE — Progress Notes (Signed)
 THERAPIST PROGRESS NOTE  Session Time: 1:00 pm to 2:55 pm  Virtual Visit via Video Note   I connected with Katrina Stewart  at 1:00 pm EST by a video enabled telemedicine application and verified that I am speaking with the correct person using two identifiers.   Location: Patient: home Provider: [address] [place]   I discussed the limitations of evaluation and management by telemedicine and the availability of in person appointments. The patient expressed understanding and agreed to proceed.   Type of Therapy: Individual   Therapist Response/Interventions: Supportive listening/Grief therapy/ discuss the 3 stages of relapse/discuss psychiatrist's recommendation pursuing DBT in therapy. Therapist discusses the inability of young children to abstract think and therefore not understand that death is permanent.    Therapy Goals Addressed: Problem: Substance Use  Dates: Start:  09/15/23    Disciplines: Interdisciplinary, PROVIDER  Goal: Katrina will abstain from alcohol use per her report and if she comes in person, also by breathalyzer  if indicated.  Dates: Start:  09/15/23   Expected End:  03/17/24    Disciplines: Interdisciplinary, PROVIDER  Goal: Katrina will decrease her anxiety and depression by reporting them no higher than 4 on the GAD-7 and PHQ-9.  Dates: Expected End:  03/17/24    Disciplines: Interdisciplinary, PROVIDER  Intervention: Therapist will educate Katrina about SUD's patterns and consequences of use, relapse risks, the treatment process and types of mutual support group and provide early recovery coping and relapse prevention skills  Dates: Start:  09/15/23    Intervention: Therapist will assists Katrina in identifying and changing thought and behaviors that led to mood symptoms and anxiety symptoms.  Dates: Start:  09/15/23    Description: Katrina Stewart gives this therapist verbal permission to electronically sign her Care Plan    Summary: Katrina Stewart presents today for virtual  therapy.  She rates her depression as an 8 today and her anxiety as a 0. She says she has been thinking of the twins she lost and is depressed. She says nothing was found wrong with one until around 21 weeks and he was sent to a specialist.  She says she was told he may need surgery, but the baby had passed away. She says she was glad her mother was there when she received the news of her 1st son's death.  She says she had emergency surgery at 22 weeks.   Then  3 weeks later she had to have an emergency c - section and lost that son, as well.  She says her 30 and 64 yr old sons were excited for the twins to be born and they done' quite understand what happened to the twins except to say they are living 'in heaven  Katrina Stewart reports she is drinking again and  drinks 2 shots daily and on the weekend will drink 1/5 of liquor.  Katrina voices an understanding of the 3 stages of relapse.  After therapist discusses what DBT is about and asks Katrina if she would like to pursue DBT, she agrees.    Therapist asks Katrina Stewart to focus on any copying skills she has used previously and any new ones she may come up with and therapist will introduce coping skills as well.  She  Progress Towards Goals: Limited progress  Suicidal/Homicidal: denies  Plan: Return again virtually on   Diagnosis: Post Traumatic Stress Disorder Generalized Anxiety Disorder Alcohol Use Disorder, Severe, Dependence Bipolar Affective Disorder  Collaboration of Care: N/A  Patient/Guardian was advised Release of Information must  be obtained prior to any record release in order to collaborate their care with an outside provider. Patient/Guardian was advised if they have not already done so to contact the registration department to sign all necessary forms in order for us  to release information regarding their care.   Consent: Patient/Guardian gives verbal consent for treatment and assignment of benefits for services provided during this visit.  Patient/Guardian expressed understanding and agreed to proceed.   [name], MS LMFT, LCAS

## 2023-12-21 NOTE — Progress Notes (Signed)
 Patient reviewed lab results and provider recommendations via MyChart

## 2024-01-12 NOTE — Progress Notes (Unsigned)
 Patient had an appointment today at 9 am virtually. Sent the link to connect twice, however patient did not connect. Waited for her until 9:20 am.  Her current appointment was marked as a no show.   Koralee Wedeking 9:20 AM

## 2024-01-13 ENCOUNTER — Telehealth (INDEPENDENT_AMBULATORY_CARE_PROVIDER_SITE_OTHER): Payer: Self-pay

## 2024-01-13 DIAGNOSIS — Z91199 Patient's noncompliance with other medical treatment and regimen due to unspecified reason: Secondary | ICD-10-CM

## 2024-01-17 ENCOUNTER — Ambulatory Visit: Admitting: Family Medicine

## 2024-01-17 ENCOUNTER — Encounter: Payer: Self-pay | Admitting: Family Medicine

## 2024-01-17 ENCOUNTER — Other Ambulatory Visit (HOSPITAL_COMMUNITY)
Admission: RE | Admit: 2024-01-17 | Discharge: 2024-01-17 | Disposition: A | Discharge status: Home / Self Care | Source: Ambulatory Visit | Attending: Family Medicine | Admitting: Family Medicine

## 2024-01-17 VITALS — BP 132/85 | HR 79 | Ht 68.0 in | Wt 289.4 lb

## 2024-01-17 DIAGNOSIS — Z113 Encounter for screening for infections with a predominantly sexual mode of transmission: Secondary | ICD-10-CM

## 2024-01-17 DIAGNOSIS — F32A Depression, unspecified: Secondary | ICD-10-CM

## 2024-01-17 DIAGNOSIS — Z7689 Persons encountering health services in other specified circumstances: Secondary | ICD-10-CM | POA: Diagnosis not present

## 2024-01-17 DIAGNOSIS — E66813 Obesity, class 3: Secondary | ICD-10-CM

## 2024-01-17 MED ORDER — PHENTERMINE HCL 37.5 MG PO TABS: 37.5000 mg | ORAL_TABLET | Freq: Every day | ORAL | 0 refills | Status: AC

## 2024-01-18 ENCOUNTER — Other Ambulatory Visit: Payer: Self-pay | Admitting: Family Medicine

## 2024-01-18 ENCOUNTER — Encounter: Payer: Self-pay | Admitting: Family Medicine

## 2024-01-18 ENCOUNTER — Ambulatory Visit: Payer: Self-pay | Admitting: Family Medicine

## 2024-01-18 DIAGNOSIS — Z202 Contact with and (suspected) exposure to infections with a predominantly sexual mode of transmission: Secondary | ICD-10-CM | POA: Diagnosis not present

## 2024-01-18 LAB — CERVICOVAGINAL ANCILLARY ONLY
Bacterial Vaginitis (gardnerella): POSITIVE — AB
Candida Glabrata: NEGATIVE
Candida Vaginitis: POSITIVE — AB
Chlamydia: NEGATIVE
Comment: NEGATIVE
Comment: NEGATIVE
Comment: NEGATIVE
Comment: NEGATIVE
Comment: NEGATIVE
Comment: NORMAL
Neisseria Gonorrhea: POSITIVE — AB
Trichomonas: NEGATIVE

## 2024-01-18 MED ORDER — CEFTRIAXONE SODIUM 500 MG IJ SOLR
500.0000 mg | Freq: Once | INTRAMUSCULAR | Status: AC
  Administered 2024-01-18: 16:00:00 500 mg via INTRAMUSCULAR

## 2024-01-18 MED ORDER — DOXYCYCLINE HYCLATE 100 MG PO TABS: 100.0000 mg | ORAL_TABLET | Freq: Two times a day (BID) | ORAL | 0 refills | Status: DC

## 2024-01-18 MED ORDER — METRONIDAZOLE 500 MG PO TABS: 500.0000 mg | ORAL_TABLET | Freq: Two times a day (BID) | ORAL | 0 refills | Status: AC

## 2024-01-18 MED ORDER — FLUCONAZOLE 150 MG PO TABS: 150.0000 mg | ORAL_TABLET | Freq: Once | ORAL | 0 refills | Status: AC

## 2024-01-18 NOTE — Progress Notes (Signed)
 Patient is in office today for a nurse visit for injection for STD.

## 2024-01-18 NOTE — Progress Notes (Signed)
 Established Patient Office Visit  Subjective    Patient ID: Katrina Stewart, female    DOB: [birth year]  Age: 28 y.o. MRN: [id number]  CC:  Chief Complaint  Patient presents with   Medical Management of Chronic Issues    Pt is here for follow up on weight. Pt also reports wanting to be tested for STD, vaginitis, and yeast.     HPI Katrina Stewart presents for routine weight management. Patient also reports that she would like to be tested for STD. Patient denies GU sx.   Outpatient Encounter Medications as of 01/17/2024  Medication Sig   FLUoxetine  (PROZAC ) 10 MG capsule Take 1 capsule (10 mg total) by mouth daily.   FLUoxetine  (PROZAC ) 20 MG capsule Take 1 capsule (20 mg total) by mouth daily.   QUEtiapine  (SEROQUEL ) 100 MG tablet Take 1 tablet (100 mg total) by mouth at bedtime.   Vitamin D , Ergocalciferol , (DRISDOL ) 1.25 MG (50000 UNIT) CAPS capsule Take 1 capsule (50,000 Units total) by mouth every 7 (seven) days.   [DISCONTINUED] phentermine  (ADIPEX-P ) 37.5 MG tablet Take 1 tablet (37.5 mg total) by mouth daily before breakfast.   acetaminophen  (TYLENOL ) 500 MG tablet Take 1,000 mg by mouth every 6 (six) hours as needed. (Patient not taking: Reported on 12/14/2023)   Blood Pressure Monitoring (BLOOD PRESSURE KIT) DEVI 1 each by Does not apply route as needed. (Patient not taking: Reported on 09/02/2023)   Misc. Devices (GOJJI WEIGHT SCALE) MISC 1 Device by Does not apply route as needed. (Patient not taking: Reported on 09/02/2023)   phentermine  (ADIPEX-P ) 37.5 MG tablet Take 1 tablet (37.5 mg total) by mouth daily before breakfast.   No facility-administered encounter medications on file as of 01/17/2024.    Past Medical History:  Diagnosis Date   Acute pancreatitis 12/30/2022   Alcohol abuse 12/30/2022   Alcohol use disorder    Anxiety    Anxiety and depression 12/30/2022   Bipolar disorder (manic depression) (HCC) 02/12/2022   Chronic post-traumatic stress disorder  (PTSD) 10/04/2022   Depression    Phreesia 12/30/2019   Essential hypertension    Gestational diabetes mellitus (GDM)    Normal postpartum 2 hr GTT   History of depression 08/29/2018   History of gestational diabetes 10/03/2019   Iron  deficiency anemia 01/18/2023   Prediabetes 05/05/2021   Psychophysiological insomnia 05/05/2021   Severe episode of recurrent major depressive disorder, without psychotic features (HCC)    Suspected sleep apnea 01/18/2023   Vitamin D  deficiency 01/18/2023    Past Surgical History:  Procedure Laterality Date   BREAST SURGERY  2013   Lt & Rt excision juvenile fibroadenoma   CESAREAN SECTION  08/20/2023   MASS EXCISION  10/29/2011   Procedure: EXCISION MASS;  Surgeon: Vicenta DELENA Poli, MD;  Location: [facility];  Service: General;  Laterality: Bilateral;  excision of bilateral breast masses    Family History  Problem Relation Age of Onset   Diabetes Mother    Hypertension Mother    Miscarriages / Stillbirths Mother    Other Father        doesn't know what he died from   Asthma Brother    Anxiety disorder Brother    Cancer Maternal Grandmother        lung cancer   Cancer Other        bladder cancer   ADD / ADHD Brother    Diabetes Maternal Uncle    ADD / ADHD Son    Asthma  Son     Social History   Socioeconomic History   Marital status: Single    Spouse name: Not on file   Number of children: Not on file   Years of education: Not on file   Highest education level: Some college, no degree  Occupational History    Comment: unemployed  Tobacco Use   Smoking status: Never    Passive exposure: Never   Smokeless tobacco: Never  Vaping Use   Vaping status: Never Used  Substance and Sexual Activity   Alcohol use: Not Currently    Alcohol/week: 2.0 - 3.0 standard drinks of alcohol    Types: 2 - 3 Shots of liquor per week    Comment: occasional   Drug use: No   Sexual activity: Not Currently    Birth control/protection: None  Other  Topics Concern   Not on file  Social History Narrative   Not on file   Social Drivers of Health   Tobacco Use: Low Risk (01/17/2024)   Patient History    Smoking Tobacco Use: Never    Smokeless Tobacco Use: Never    Passive Exposure: Never  Financial Resource Strain: Low Risk (12/13/2023)   Overall Financial Resource Strain (CARDIA)    Difficulty of Paying Living Expenses: Not hard at all  Food Insecurity: No Food Insecurity (12/13/2023)   Epic    Worried About Programme Researcher, Broadcasting/film/video in the Last Year: Never true    Ran Out of Food in the Last Year: Never true  Transportation Needs: No Transportation Needs (12/13/2023)   Epic    Lack of Transportation (Medical): No    Lack of Transportation (Non-Medical): No  Physical Activity: Insufficiently Active (12/13/2023)   Exercise Vital Sign    Days of Exercise per Week: 7 days    Minutes of Exercise per Session: 20 min  Stress: Stress Concern Present (12/13/2023)   [employer] of Occupational Health - Occupational Stress Questionnaire    Feeling of Stress: Very much  Social Connections: Moderately Isolated (12/13/2023)   Social Connection and Isolation Panel    Frequency of Communication with Friends and Family: Twice a week    Frequency of Social Gatherings with Friends and Family: More than three times a week    Attends Religious Services: 1 to 4 times per year    Active Member of [employer] or Organizations: No    Attends Engineer, Structural: Not on file    Marital Status: Never married  Intimate Partner Violence: Not At Risk (08/31/2023)   Epic    Fear of Current or Ex-Partner: No    Emotionally Abused: No    Physically Abused: No    Sexually Abused: No  Depression (PHQ2-9): High Risk (10/06/2023)   Depression (PHQ2-9)    PHQ-2 Score: 18  Alcohol Screen: Medium Risk (12/13/2023)   Alcohol Screen    Last Alcohol Screening Score (AUDIT): 14  Housing: Low Risk (12/13/2023)   Epic    Unable to Pay for Housing in the  Last Year: No    Number of Times Moved in the Last Year: 0    Homeless in the Last Year: No  Utilities: Not At Risk (08/31/2023)   Epic    Threatened with loss of utilities: No  Health Literacy: Adequate Health Literacy (12/07/2022)   B1300 Health Literacy    Frequency of need for help with medical instructions: Never    Review of Systems  All other systems reviewed and are negative.  Objective    BP 132/85   Pulse 79   Ht 5' 8 (1.727 m)   Wt 289 lb 6.4 oz (131.3 kg)   LMP 12/25/2023 (Exact Date)   SpO2 98%   Breastfeeding No   BMI 44.00 kg/m   Physical Exam Vitals and nursing note reviewed.  Constitutional:      General: She is not in acute distress.    Appearance: She is obese.  Cardiovascular:     Rate and Rhythm: Normal rate and regular rhythm.  Pulmonary:     Effort: Pulmonary effort is normal.     Breath sounds: Normal breath sounds.  Abdominal:     Palpations: Abdomen is soft.     Tenderness: There is no abdominal tenderness.  Neurological:     General: No focal deficit present.     Mental Status: She is alert and oriented to person, place, and time.         Assessment & Plan:   Screening for STDs (sexually transmitted diseases) -     Cervicovaginal ancillary only  Encounter for weight management  Class 3 severe obesity due to excess calories without serious comorbidity with body mass index (BMI) of 40.0 to 44.9 in adult [facility])  Depression, unspecified depression type  Other orders -     Phentermine  HCl; Take 1 tablet (37.5 mg total) by mouth daily before breakfast.  Dispense: 30 tablet; Refill: 0     Return in about 6 weeks (around 02/28/2024) for follow up, weight management.   Katrina Raguel SQUIBB, MD

## 2024-01-19 ENCOUNTER — Ambulatory Visit: Admitting: Family Medicine

## 2024-01-24 ENCOUNTER — Ambulatory Visit (INDEPENDENT_AMBULATORY_CARE_PROVIDER_SITE_OTHER)

## 2024-01-24 ENCOUNTER — Encounter (HOSPITAL_COMMUNITY): Payer: Self-pay

## 2024-01-24 DIAGNOSIS — F411 Generalized anxiety disorder: Secondary | ICD-10-CM

## 2024-01-24 DIAGNOSIS — F3160 Bipolar disorder, current episode mixed, unspecified: Secondary | ICD-10-CM

## 2024-01-24 DIAGNOSIS — F1021 Alcohol dependence, in remission: Secondary | ICD-10-CM

## 2024-01-24 DIAGNOSIS — F4312 Post-traumatic stress disorder, chronic: Secondary | ICD-10-CM
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
## 2024-02-03 ENCOUNTER — Encounter (HOSPITAL_COMMUNITY): Payer: Self-pay
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
## 2024-02-09 ENCOUNTER — Telehealth (INDEPENDENT_AMBULATORY_CARE_PROVIDER_SITE_OTHER)

## 2024-02-09 DIAGNOSIS — F4312 Post-traumatic stress disorder, chronic: Secondary | ICD-10-CM

## 2024-02-09 DIAGNOSIS — F411 Generalized anxiety disorder: Secondary | ICD-10-CM | POA: Diagnosis not present

## 2024-02-09 DIAGNOSIS — F3160 Bipolar disorder, current episode mixed, unspecified: Secondary | ICD-10-CM

## 2024-02-09 MED ORDER — QUETIAPINE FUMARATE 100 MG PO TABS: 100.0000 mg | ORAL_TABLET | Freq: Every day | ORAL | 1 refills | Status: AC

## 2024-02-09 MED ORDER — FLUOXETINE HCL 40 MG PO CAPS: 40.0000 mg | ORAL_CAPSULE | Freq: Every day | ORAL | 1 refills | Status: AC

## 2024-02-23 ENCOUNTER — Ambulatory Visit
Admission: EM | Admit: 2024-02-23 | Discharge: 2024-02-23 | Disposition: A | Discharge status: Home / Self Care | Attending: Family Medicine | Admitting: Family Medicine

## 2024-02-23 ENCOUNTER — Ambulatory Visit: Payer: Self-pay | Admitting: Nurse Practitioner

## 2024-02-23 ENCOUNTER — Encounter: Payer: Self-pay | Admitting: Emergency Medicine

## 2024-02-23 ENCOUNTER — Telehealth: Admitting: Physician Assistant

## 2024-02-23 ENCOUNTER — Ambulatory Visit (INDEPENDENT_AMBULATORY_CARE_PROVIDER_SITE_OTHER)

## 2024-02-23 DIAGNOSIS — M25562 Pain in left knee: Secondary | ICD-10-CM

## 2024-02-23 DIAGNOSIS — T148XXA Other injury of unspecified body region, initial encounter: Secondary | ICD-10-CM | POA: Diagnosis not present

## 2024-02-23 DIAGNOSIS — S80811A Abrasion, right lower leg, initial encounter: Secondary | ICD-10-CM | POA: Diagnosis not present

## 2024-02-23 DIAGNOSIS — S80212A Abrasion, left knee, initial encounter: Secondary | ICD-10-CM | POA: Diagnosis not present

## 2024-02-23 MED ORDER — DOXYCYCLINE HYCLATE 100 MG PO CAPS: 100.0000 mg | ORAL_CAPSULE | Freq: Two times a day (BID) | ORAL | 0 refills | Status: AC

## 2024-02-23 NOTE — Discharge Instructions (Addendum)
 Your knee x-ray was negative for fracture.  Please keep the abrasion on your left knee clean and dry.  Start doxycycline  antibiotic twice daily for 7 days to help prevent infection.  If you do develop any fevers, redness or warmth of the knee please go to the emergency room ASAP for further evaluation.  You may elevate and ice the knee as needed.  Take over-the-counter ibuprofen  or Tylenol  for pain.  Use the Ace wrap to help support the joint.  Follow-up with your PCP in 2 to 3 days for recheck.  Please go to the ER for any worsening symptoms.  I hope you feel better soon!

## 2024-02-23 NOTE — ED Triage Notes (Addendum)
 Pt reports bilateral knee pain x2 weeks. L knee is worse than R. Reports she had a fall 2 weeks ago and pain is stemming from that. Pt notes she was drinking and is not sure exactly what happened, but knows she fell forward landing on both knees. Describes throbbing, burning pain with intermittent sharp pain in knees. Able to walk but moves slower trying not to put as much pressure on the joints. Has not been seen by medical provider since the fall. Pt believes there was swelling to both knee joints initially after fall that has since resolved. Taking extra-strength tylenol  with no relief. Pt notes abrasions to both knees covered with band-aids.

## 2024-02-23 NOTE — Progress Notes (Signed)
 " Virtual Visit Consent   Katrina Stewart, you are scheduled for a virtual visit with a [facility] provider today. Just as with appointments in the office, your consent must be obtained to participate. Your consent will be active for this visit and any virtual visit you may have with one of our providers in the next 365 days. If you have a MyChart account, a copy of this consent can be sent to you electronically.  As this is a virtual visit, video technology does not allow for your provider to perform a traditional examination. This may limit your provider's ability to fully assess your condition. If your provider identifies any concerns that need to be evaluated in person or the need to arrange testing (such as labs, EKG, etc.), we will make arrangements to do so. Although advances in technology are sophisticated, we cannot ensure that it will always work on either your end or our end. If the connection with a video visit is poor, the visit may have to be switched to a telephone visit. With either a video or telephone visit, we are not always able to ensure that we have a secure connection.  By engaging in this virtual visit, you consent to the provision of healthcare and authorize for your insurance to be billed (if applicable) for the services provided during this visit. Depending on your insurance coverage, you may receive a charge related to this service.  I need to obtain your verbal consent now. Are you willing to proceed with your visit today? Katrina Stewart has provided verbal consent on 02/23/2024 for a virtual visit (video or telephone). Zerrick Hanssen, [place]  Date: 02/23/2024 8:27 AM   Virtual Visit via Video Note   I, Ayodele Hartsock, connected with  Katrina Stewart  ([id number], [birth year]) on 02/23/24 at  8:15 AM EST by a video-enabled telemedicine application and verified that I am speaking with the correct person using two identifiers.  Location: Patient: Virtual Visit Location  Patient: Home Provider: Virtual Visit Location Provider: Home Office   I discussed the limitations of evaluation and management by telemedicine and the availability of in person appointments. The patient expressed understanding and agreed to proceed.    History of Present Illness: Katrina Stewart is a 29 y.o. who identifies as a female who was assigned female at birth, and is being seen today for knee pain.  HPI: Patient presents for evaluation of left knee pain going on for the last 2 weeks.  Patient reports that she had a fall after stumbling while intoxicated and landed on her knees.  She denies any head injury, loss of consciousness.  Patient reports her right knee is not as bothersome as her left 1, she has an open wound which she has been taking care of at home with topical antibiotic creams.  Patient reports 2 out of 10 pain at rest, pain is a 10 out of 10 with standing and bearing weight.  She denies any fever, chills, nausea, vomiting, headaches, vision changes.  She has not been seen for this injury.  She reports a foul smell from the wound.  Has been treating with Tylenol .      Problems:  Patient Active Problem List   Diagnosis Date Noted   S/P cesarean section 09/02/2023   Loss of infant 08/24/2023   Parvovirus infection in mother during second trimester of pregnancy 08/17/2023   Obesity affecting pregnancy, antepartum 08/15/2023   Generalized anxiety disorder 05/17/2023   Thalassemia alpha carrier  05/03/2023   Suspected sleep apnea 01/18/2023   Chronic post-traumatic stress disorder (PTSD) 10/04/2022   Bipolar affective disorder, current episode mixed (HCC) 05/05/2021   Hx of Gestational diabetes mellitus (GDM), antepartum 01/08/2019   Chronic hypertension during pregnancy 11/20/2018   Rh negative state in antepartum period 09/03/2015    Allergies: Allergies[1] Medications: Current Medications[2]  Observations/Objective: Patient is well-developed, well-nourished in no  acute distress.  Resting comfortably  at home.  Head is normocephalic, atraumatic.  No labored breathing.  Speech is clear and coherent with logical content.  Patient is alert and oriented at baseline.  Left knee: Anterior knee, open wound, no obvious drainage noted over video, patient reported tenderness to palpation surrounding the open wound, and over the lateral anterior patella   Assessment and Plan: 1. Acute pain of left knee (Primary)  2. Open wound  2 weeks of left knee pain status post fall with open wound patient was advised to be evaluated in person for hands-on exam and possible imaging due to ongoing severe pain.  Patient is in agreement and will follow-up in clinic with one of the [facility] health urgent cares.  Follow Up Instructions: I discussed the assessment and treatment plan with the patient. The patient was provided an opportunity to ask questions and all were answered. The patient agreed with the plan and demonstrated an understanding of the instructions.  A copy of instructions were sent to the patient via MyChart unless otherwise noted below.    The patient was advised to call back or seek an in-person evaluation if the symptoms worsen or if the condition fails to improve as anticipated.    Marigny Borre, PA-C    [1] No Known Allergies [2]  Current Outpatient Medications:    acetaminophen  (TYLENOL ) 500 MG tablet, Take 1,000 mg by mouth every 6 (six) hours as needed. (Patient not taking: Reported on 12/14/2023), Disp: , Rfl:    Blood Pressure Monitoring (BLOOD PRESSURE KIT) DEVI, 1 each by Does not apply route as needed. (Patient not taking: Reported on 09/02/2023), Disp: 1 each, Rfl: 0   doxycycline  (VIBRA -TABS) 100 MG tablet, Take 1 tablet (100 mg total) by mouth 2 (two) times daily., Disp: 14 tablet, Rfl: 0   FLUoxetine  (PROZAC ) 40 MG capsule, Take 1 capsule (40 mg total) by mouth daily., Disp: 30 capsule, Rfl: 1   Misc. Devices (GOJJI WEIGHT SCALE) MISC, 1 Device  by Does not apply route as needed. (Patient not taking: Reported on 09/02/2023), Disp: 1 each, Rfl: 0   phentermine  (ADIPEX-P ) 37.5 MG tablet, Take 1 tablet (37.5 mg total) by mouth daily before breakfast., Disp: 30 tablet, Rfl: 0   QUEtiapine  (SEROQUEL ) 100 MG tablet, Take 1 tablet (100 mg total) by mouth at bedtime., Disp: 30 tablet, Rfl: 1   Vitamin D , Ergocalciferol , (DRISDOL ) 1.25 MG (50000 UNIT) CAPS capsule, Take 1 capsule (50,000 Units total) by mouth every 7 (seven) days., Disp: 12 capsule, Rfl: 0  "

## 2024-02-23 NOTE — Patient Instructions (Signed)
" °  Jalicia N Heuberger, thank you for joining Makella Buckingham, PA-C for today's virtual visit.  While this provider is not your primary care provider (PCP), if your PCP is located in our provider database this encounter information will be shared with them immediately following your visit.   A [facility] MyChart account gives you access to today's visit and all your visits, tests, and labs performed at [facility]  click here if you don't have a [facility] MyChart account or go to [email or web address]  Consent: (Patient) Rondell N Farler provided verbal consent for this virtual visit at the beginning of the encounter.  Current Medications:  Current Outpatient Medications:    acetaminophen  (TYLENOL ) 500 MG tablet, Take 1,000 mg by mouth every 6 (six) hours as needed. (Patient not taking: Reported on 12/14/2023), Disp: , Rfl:    Blood Pressure Monitoring (BLOOD PRESSURE KIT) DEVI, 1 each by Does not apply route as needed. (Patient not taking: Reported on 09/02/2023), Disp: 1 each, Rfl: 0   doxycycline  (VIBRA -TABS) 100 MG tablet, Take 1 tablet (100 mg total) by mouth 2 (two) times daily., Disp: 14 tablet, Rfl: 0   FLUoxetine  (PROZAC ) 40 MG capsule, Take 1 capsule (40 mg total) by mouth daily., Disp: 30 capsule, Rfl: 1   Misc. Devices (GOJJI WEIGHT SCALE) MISC, 1 Device by Does not apply route as needed. (Patient not taking: Reported on 09/02/2023), Disp: 1 each, Rfl: 0   phentermine  (ADIPEX-P ) 37.5 MG tablet, Take 1 tablet (37.5 mg total) by mouth daily before breakfast., Disp: 30 tablet, Rfl: 0   QUEtiapine  (SEROQUEL ) 100 MG tablet, Take 1 tablet (100 mg total) by mouth at bedtime., Disp: 30 tablet, Rfl: 1   Vitamin D , Ergocalciferol , (DRISDOL ) 1.25 MG (50000 UNIT) CAPS capsule, Take 1 capsule (50,000 Units total) by mouth every 7 (seven) days., Disp: 12 capsule, Rfl: 0   Medications ordered in this encounter:  No orders of the defined types were placed in this encounter.    *If you  need refills on other medications prior to your next appointment, please contact your pharmacy*  Follow-Up: Call back or seek an in-person evaluation if the symptoms worsen or if the condition fails to improve as anticipated.  [facility] [phone]  Other Instructions    If you have been instructed to have an in-person evaluation today at a local Urgent Care facility, please use the link below. It will take you to a list of all of our available [facility], including address, phone number and hours of operation. Please do not delay care.  [facility]  If you or a family member do not have a primary care provider, use the link below to schedule a visit and establish care. When you choose a [facility] primary care physician or advanced practice provider, you gain a long-term partner in health. Find a Primary Care Provider  Learn more about [facility] in-office and virtual care options: [facility] - Get Care Now  "

## 2024-02-23 NOTE — ED Provider Notes (Signed)
 " [facility]    CSN: [id number] Arrival date & time: 02/23/24  9073      History   Chief Complaint Chief Complaint  Patient presents with   Knee Pain   Fall    HPI Katrina Stewart is a 29 y.o. female with past medical history of prediabetes, depression, vitamin D  deficiency, hypertension, bipolar, anxiety, alcohol abuse presents for knee pain.  Patient reports 2 weeks ago she fell landing onto both of her knees.  She said she did have some alcohol but is not sure what specifically happened to cause her fall.  She does deny head injury or LOC.  Reports several abrasions to both lower legs but primarily continuing to have left knee pain and swelling.  States she is able to bear weight but it is painful.  Reports swelling has improved.  Denies any bruising, erythema, warmth, numbness or tingling.  No fevers or chills.  No history of MRSA.  She is up-to-date on her tetanus.  She has been trying to use soap and water  to keep the wounds dry but she states it smells.  She has been taking Tylenol  OTC for pain.  No other concerns.   Knee Pain Fall    Past Medical History:  Diagnosis Date   Acute pancreatitis 12/30/2022   Alcohol abuse 12/30/2022   Alcohol use disorder    Anxiety    Anxiety and depression 12/30/2022   Bipolar disorder (manic depression) (HCC) 02/12/2022   Chronic post-traumatic stress disorder (PTSD) 10/04/2022   Depression    Phreesia 12/30/2019   Essential hypertension    Gestational diabetes mellitus (GDM)    Normal postpartum 2 hr GTT   History of depression 08/29/2018   History of gestational diabetes 10/03/2019   Iron  deficiency anemia 01/18/2023   Prediabetes 05/05/2021   Psychophysiological insomnia 05/05/2021   Severe episode of recurrent major depressive disorder, without psychotic features (HCC)    Suspected sleep apnea 01/18/2023   Vitamin D  deficiency 01/18/2023    Patient Active Problem List   Diagnosis Date Noted   S/P  cesarean section 09/02/2023   Loss of infant 08/24/2023   Parvovirus infection in mother during second trimester of pregnancy 08/17/2023   Obesity affecting pregnancy, antepartum 08/15/2023   Generalized anxiety disorder 05/17/2023   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   Suspected sleep apnea 01/18/2023   Chronic post-traumatic stress disorder (PTSD) 10/04/2022   Bipolar affective disorder, current episode mixed (HCC) 05/05/2021   Hx of Gestational diabetes mellitus (GDM), antepartum 01/08/2019   Chronic hypertension during pregnancy 11/20/2018   Rh negative state in antepartum period 09/03/2015    Past Surgical History:  Procedure Laterality Date   BREAST SURGERY  2013   Lt & Rt excision juvenile fibroadenoma   CESAREAN SECTION  08/20/2023   MASS EXCISION  10/29/2011   Procedure: EXCISION MASS;  Surgeon: Vicenta DELENA Poli, MD;  Location: [facility];  Service: General;  Laterality: Bilateral;  excision of bilateral breast masses    OB History     Gravida  5   Para  3   Term  2   Preterm  1   AB  1   Living  2      SAB  1   IAB  0   Ectopic      Multiple  1   Live Births  4            Home Medications    Prior to Admission medications  Medication  Sig Start Date End Date Taking? Authorizing Provider  acetaminophen  (TYLENOL ) 500 MG tablet Take 1,000 mg by mouth every 6 (six) hours as needed.   Yes [provider]  doxycycline  (VIBRAMYCIN ) 100 MG capsule Take 1 capsule (100 mg total) by mouth 2 (two) times daily for 7 days. 02/23/24 03/01/24 Yes Vivien Barretto, Jodi R, NP  FLUoxetine  (PROZAC ) 40 MG capsule Take 1 capsule (40 mg total) by mouth daily. 02/09/24  Yes Kapoor, Sahil, MD  phentermine  (ADIPEX-P ) 37.5 MG tablet Take 1 tablet (37.5 mg total) by mouth daily before breakfast. 01/17/24  Yes Tanda Bleacher, MD  QUEtiapine  (SEROQUEL ) 100 MG tablet Take 1 tablet (100 mg total) by mouth at bedtime. 02/09/24  Yes Kapoor, Sahil, MD  Vitamin D , Ergocalciferol ,  (DRISDOL ) 1.25 MG (50000 UNIT) CAPS capsule Take 1 capsule (50,000 Units total) by mouth every 7 (seven) days. 12/14/23  Yes Tanda Bleacher, MD  apixaban  (ELIQUIS ) 5 MG TABS tablet Take 5 mg by mouth. Patient not taking: Reported on 02/23/2024    [provider]  Blood Pressure Monitoring (BLOOD PRESSURE KIT) DEVI 1 each by Does not apply route as needed. Patient not taking: Reported on 09/02/2023 05/03/23   Regino Camie LABOR, CNM  fluconazole  (DIFLUCAN ) 150 MG tablet Take 150 mg by mouth once. Patient not taking: Reported on 02/23/2024 01/18/24   [provider]  ibuprofen  (ADVIL ) 600 MG tablet Take 600 mg by mouth. Patient not taking: Reported on 02/23/2024 08/24/23   [provider]  Misc. Devices (GOJJI WEIGHT SCALE) MISC 1 Device by Does not apply route as needed. Patient not taking: Reported on 09/02/2023 05/03/23   Regino Camie LABOR, CNM    Family History Family History  Problem Relation Age of Onset   Diabetes Mother    Hypertension Mother    Miscarriages / Stillbirths Mother    Other Father        doesn't know what he died from   Asthma Brother    Anxiety disorder Brother    Cancer Maternal Grandmother        lung cancer   Cancer Other        bladder cancer   ADD / ADHD Brother    Diabetes Maternal Uncle    ADD / ADHD Son    Asthma Son     Social History Social History[1]   Allergies   Patient has no known allergies.   Review of Systems Review of Systems  Musculoskeletal:        Left knee pain after fall  Skin:        Abrasions bilateral knees as well as right lower extremity     Physical Exam Triage Vital Signs ED Triage Vitals  Encounter Vitals Group     BP 02/23/24 1001 (!) 138/91     Girls Systolic BP Percentile --      Girls Diastolic BP Percentile --      Boys Systolic BP Percentile --      Boys Diastolic BP Percentile --      Pulse Rate 02/23/24 1001 90     Resp 02/23/24 1001 16     Temp 02/23/24 1001 98.1 F (36.7 C)      Temp Source 02/23/24 1001 Oral     SpO2 02/23/24 1001 98 %     Weight --      Height --      Head Circumference --      Peak Flow --      Pain Score  02/23/24 1002 8     Pain Loc --      Pain Education --      Exclude from Growth Chart --    No data found.  Updated Vital Signs BP (!) 138/91 (BP Location: Left Arm)   Pulse 90   Temp 98.1 F (36.7 C) (Oral)   Resp 16   LMP 02/16/2024 (Exact Date)   SpO2 98%   Visual Acuity Right Eye Distance:   Left Eye Distance:   Bilateral Distance:    Right Eye Near:   Left Eye Near:    Bilateral Near:     Physical Exam Vitals and nursing note reviewed.  Constitutional:      General: She is not in acute distress.    Appearance: Normal appearance. She is not ill-appearing.  HENT:     Head: Normocephalic and atraumatic.  Eyes:     Pupils: Pupils are equal, round, and reactive to light.  Cardiovascular:     Rate and Rhythm: Normal rate.  Pulmonary:     Effort: Pulmonary effort is normal.  Musculoskeletal:     Right knee: No swelling, deformity, erythema, ecchymosis, bony tenderness or crepitus. Normal range of motion. No tenderness. Normal patellar mobility. Normal pulse.     Left knee: Swelling and bony tenderness present. No deformity, effusion, erythema, ecchymosis, lacerations or crepitus. Decreased range of motion. Tenderness present over the medial joint line, lateral joint line and patellar tendon. Normal alignment and normal patellar mobility. Normal pulse.     Comments: Several healing abrasions to the right knee and right lower extremity.  Positive valgus and varus stress test to right knee.  Large abrasion to the left anterior knee without drainage or erythema or warmth.  See photos  Skin:    General: Skin is warm and dry.  Neurological:     General: No focal deficit present.     Mental Status: She is alert and oriented to person, place, and time.  Psychiatric:        Mood and Affect: Mood normal.        Behavior:  Behavior normal.         UC Treatments / Results  Labs (all labs ordered are listed, but only abnormal results are displayed) Labs Reviewed - No data to display  EKG   Radiology DG Knee Complete 4 Views Left Result Date: 02/23/2024 CLINICAL DATA:  Bilateral knee pain for 2 weeks after fall EXAM: LEFT KNEE - COMPLETE 4+ VIEW COMPARISON:  None Available. FINDINGS: No evidence of fracture, dislocation, or joint effusion. No evidence of arthropathy or other focal bone abnormality. Soft tissues are unremarkable. IMPRESSION: Negative. Electronically Signed   By: Lynwood Landy Raddle M.D.   On: 02/23/2024 10:46    Procedures Procedures (including critical care time)  Medications Ordered in UC Medications - No data to display  Initial Impression / Assessment and Plan / UC Course  I have reviewed the triage vital signs and the nursing notes.  Pertinent labs & imaging results that were available during my care of the patient were reviewed by me and considered in my medical decision making (see chart for details).     Reviewed exam and symptoms with patient.  Knee x-ray negative for fracture.  Abrasion was cleansed and dressed by nursing staff and Ace wrap applied.  Reviewed RICE therapy.  Given abrasion over joint will start doxycycline  to prevent infection.  Wound care reviewed.  OTC analgesics as needed.  PCP follow-up 2 to  3 days for recheck.  ER precautions reviewed. Final Clinical Impressions(s) / UC Diagnoses   Final diagnoses:  Acute pain of left knee  Abrasion of left knee, initial encounter  Abrasion, right lower leg, initial encounter     Discharge Instructions      Your knee x-ray was negative for fracture.  Please keep the abrasion on your left knee clean and dry.  Start doxycycline  antibiotic twice daily for 7 days to help prevent infection.  If you do develop any fevers, redness or warmth of the knee please go to the emergency room ASAP for further evaluation.  You may  elevate and ice the knee as needed.  Take over-the-counter ibuprofen  or Tylenol  for pain.  Use the Ace wrap to help support the joint.  Follow-up with your PCP in 2 to 3 days for recheck.  Please go to the ER for any worsening symptoms.  I hope you feel better soon!     ED Prescriptions     Medication Sig Dispense Auth. Provider   doxycycline  (VIBRAMYCIN ) 100 MG capsule Take 1 capsule (100 mg total) by mouth 2 (two) times daily for 7 days. 14 capsule Jabrea Kallstrom, Jodi R, NP      PDMP not reviewed this encounter.     [1]  Social History Tobacco Use   Smoking status: Never    Passive exposure: Never   Smokeless tobacco: Never  Vaping Use   Vaping status: Never Used  Substance Use Topics   Alcohol use: Yes    Alcohol/week: 2.0 - 3.0 standard drinks of alcohol    Types: 2 - 3 Shots of liquor per week    Comment: occasional   Drug use: No     Loreda Myla SAUNDERS, NP 02/23/24 1058  "

## 2024-02-28 ENCOUNTER — Ambulatory Visit: Payer: Self-pay | Admitting: Family Medicine

## 2024-03-01 ENCOUNTER — Ambulatory Visit (HOSPITAL_COMMUNITY)

## 2024-03-01 DIAGNOSIS — F411 Generalized anxiety disorder: Secondary | ICD-10-CM

## 2024-03-01 DIAGNOSIS — F1020 Alcohol dependence, uncomplicated: Secondary | ICD-10-CM

## 2024-03-01 NOTE — Progress Notes (Addendum)
" ° °  THERAPIST PROGRESS NOTE  Session Time: 9:01 am to 9:54 am  Virtual Visit via Video Note   I connected with Fronie Hands  at 9:01:am EST by a video enabled telemedicine application and verified that I am speaking with the correct person using two identifiers.   Location: Patient: home Provider: [address] [place]   I discussed the limitations of evaluation and management by telemedicine and the availability of in person appointments. The patient expressed understanding and agreed to proceed.   Type of Therapy: Individual   Therapist Response/Interventions: Supportive listening/Psycho-education/CBT regarding the effects that alcohol has on mental health disorders/teach grounding for anxiety decrease/motivational interviewing in efforts to move [name] closer to the contemplation stage of change.   Therapy Goals Addressed: Problem: Substance Use  Dates: Start:  09/15/23    Disciplines: Interdisciplinary, PROVIDER  Goal: [name] will abstain from alcohol use per her report and if she comes in person, also by breathalyzer  if indicated.  Dates: Start:  09/15/23   Expected End:  03/17/24    Disciplines: Interdisciplinary, PROVIDER  Goal: [name] will decrease her anxiety and depression by reporting them no higher than 4 on the GAD-7 and PHQ-9.  Dates: Expected End:  03/17/24    Disciplines: Interdisciplinary, PROVIDER  Intervention: Therapist will educate [name] about SUD's patterns and consequences of use, relapse risks, the treatment process and types of mutual support group and provide early recovery coping and relapse prevention skills  Dates: Start:  09/15/23    Intervention: Therapist will assists [name] in identifying and changing thought and behaviors that led to mood symptoms and anxiety symptoms.  Dates: Start:  09/15/23    Description: Fronie gives this therapist verbal permission to electronically sign her Care Plan    Summary: Fronie presents today for virtual therapy. She is  out of the bed and sitting up today. [name] says nothing has changed since she last saw this therapist.  She says she is trying to only drink on the weekends. [name] then says, really the last two weeks, particularly have been bad and I don't know why it's worse.. She says she has been drinking daily.  [name] says her mind is all over the place.  [name] states she does worry about things and cannot get her mind settled. Therapist introduces the grounding technique and asks [name] to practice the technique when she becomes aware that she is experiencing anxiety.  Therapist inquires as her her family history of addiction. Fronie says her maternal grandfather and uncle suffer from alcoholism.  She says she does not know the paternal family history.  Progress Towards Goals: minimal progress  Suicidal/Homicidal: denies  Plan: Return again virtually on April 05, 2024 at 9 am  Diagnosis: Post Traumatic Stress Disorder Generalized Anxiety Disorder Alcohol Use Disorder, Severe, Dependence Bipolar Affective Disorder  Collaboration of Care: N/A  Patient/Guardian was advised Release of Information must be obtained prior to any record release in order to collaborate their care with an outside provider. Patient/Guardian was advised if they have not already done so to contact the registration department to sign all necessary forms in order for us  to release information regarding their care.   Consent: Patient/Guardian gives verbal consent for treatment and assignment of benefits for services provided during this visit. Patient/Guardian expressed understanding and agreed to proceed.   [name], MS LMFT, LCAS "

## 2024-03-08 ENCOUNTER — Ambulatory Visit (HOSPITAL_COMMUNITY)

## 2024-03-22 ENCOUNTER — Telehealth (HOSPITAL_COMMUNITY)

## 2024-04-05 ENCOUNTER — Ambulatory Visit (HOSPITAL_COMMUNITY)

## 2024-04-11 ENCOUNTER — Ambulatory Visit: Admitting: Family Medicine
